# Patient Record
Sex: Female | Born: 1965
Health system: Southern US, Community
[De-identification: ages and names within clinical notes are randomized; demographics above are authoritative.]

## PROBLEM LIST (undated history)

## (undated) ENCOUNTER — Inpatient Hospital Stay (HOSPITAL_COMMUNITY): Payer: 59

## (undated) DIAGNOSIS — F32A Depression, unspecified: Secondary | ICD-10-CM

## (undated) DIAGNOSIS — T4145XA Adverse effect of unspecified anesthetic, initial encounter: Secondary | ICD-10-CM

## (undated) DIAGNOSIS — C801 Malignant (primary) neoplasm, unspecified: Secondary | ICD-10-CM

## (undated) DIAGNOSIS — F329 Major depressive disorder, single episode, unspecified: Secondary | ICD-10-CM

## (undated) DIAGNOSIS — F419 Anxiety disorder, unspecified: Secondary | ICD-10-CM

## (undated) DIAGNOSIS — Z923 Personal history of irradiation: Secondary | ICD-10-CM

## (undated) DIAGNOSIS — M8580 Other specified disorders of bone density and structure, unspecified site: Secondary | ICD-10-CM

## (undated) DIAGNOSIS — R519 Headache, unspecified: Secondary | ICD-10-CM

## (undated) DIAGNOSIS — R51 Headache: Secondary | ICD-10-CM

## (undated) DIAGNOSIS — T8859XA Other complications of anesthesia, initial encounter: Secondary | ICD-10-CM

## (undated) DIAGNOSIS — N6320 Unspecified lump in the left breast, unspecified quadrant: Secondary | ICD-10-CM

## (undated) HISTORY — PX: TONSILLECTOMY: SUR1361

## (undated) HISTORY — DX: Other specified disorders of bone density and structure, unspecified site: M85.80

## (undated) HISTORY — DX: Personal history of irradiation: Z92.3

---

## 1998-03-22 ENCOUNTER — Ambulatory Visit (HOSPITAL_COMMUNITY): Admission: RE | Admit: 1998-03-22 | Discharge: 1998-03-22 | Payer: Self-pay | Admitting: Obstetrics and Gynecology

## 1998-08-03 ENCOUNTER — Inpatient Hospital Stay (HOSPITAL_COMMUNITY): Admission: AD | Admit: 1998-08-03 | Discharge: 1998-08-03 | Payer: Self-pay | Admitting: Obstetrics and Gynecology

## 1998-08-10 ENCOUNTER — Inpatient Hospital Stay (HOSPITAL_COMMUNITY): Admission: AD | Admit: 1998-08-10 | Discharge: 1998-08-12 | Payer: Self-pay | Admitting: Obstetrics and Gynecology

## 1998-09-12 ENCOUNTER — Other Ambulatory Visit: Admission: RE | Admit: 1998-09-12 | Discharge: 1998-09-12 | Payer: Self-pay | Admitting: Obstetrics and Gynecology

## 2000-01-05 ENCOUNTER — Other Ambulatory Visit: Admission: RE | Admit: 2000-01-05 | Discharge: 2000-01-05 | Payer: Self-pay | Admitting: Obstetrics and Gynecology

## 2000-04-15 ENCOUNTER — Emergency Department (HOSPITAL_COMMUNITY): Admission: EM | Admit: 2000-04-15 | Discharge: 2000-04-15 | Payer: Self-pay | Admitting: Emergency Medicine

## 2001-01-10 ENCOUNTER — Encounter: Payer: Self-pay | Admitting: Obstetrics and Gynecology

## 2001-01-10 ENCOUNTER — Encounter: Admission: RE | Admit: 2001-01-10 | Discharge: 2001-01-10 | Payer: Self-pay | Admitting: Obstetrics and Gynecology

## 2002-07-24 ENCOUNTER — Ambulatory Visit (HOSPITAL_COMMUNITY): Admission: RE | Admit: 2002-07-24 | Discharge: 2002-07-24 | Payer: Self-pay | Admitting: Obstetrics & Gynecology

## 2005-01-06 ENCOUNTER — Inpatient Hospital Stay (HOSPITAL_COMMUNITY): Admission: AD | Admit: 2005-01-06 | Discharge: 2005-01-06 | Payer: Self-pay | Admitting: Obstetrics and Gynecology

## 2008-04-27 HISTORY — PX: TRIGGER FINGER RELEASE: SHX641

## 2008-12-21 ENCOUNTER — Ambulatory Visit (HOSPITAL_BASED_OUTPATIENT_CLINIC_OR_DEPARTMENT_OTHER): Admission: RE | Admit: 2008-12-21 | Discharge: 2008-12-21 | Payer: Self-pay | Admitting: Orthopedic Surgery

## 2009-04-30 ENCOUNTER — Ambulatory Visit: Payer: Self-pay | Admitting: Licensed Clinical Social Worker

## 2009-05-21 ENCOUNTER — Ambulatory Visit: Payer: Self-pay | Admitting: Licensed Clinical Social Worker

## 2009-05-28 ENCOUNTER — Ambulatory Visit: Payer: Self-pay | Admitting: Licensed Clinical Social Worker

## 2009-06-04 ENCOUNTER — Ambulatory Visit: Payer: Self-pay | Admitting: Licensed Clinical Social Worker

## 2009-06-11 ENCOUNTER — Ambulatory Visit: Payer: Self-pay | Admitting: Licensed Clinical Social Worker

## 2009-06-18 ENCOUNTER — Ambulatory Visit: Payer: Self-pay | Admitting: Licensed Clinical Social Worker

## 2009-07-23 ENCOUNTER — Ambulatory Visit: Payer: Self-pay | Admitting: Licensed Clinical Social Worker

## 2009-07-30 ENCOUNTER — Ambulatory Visit: Payer: Self-pay | Admitting: Licensed Clinical Social Worker

## 2009-08-20 ENCOUNTER — Ambulatory Visit: Payer: Self-pay | Admitting: Licensed Clinical Social Worker

## 2009-08-27 ENCOUNTER — Ambulatory Visit: Payer: Self-pay | Admitting: Licensed Clinical Social Worker

## 2009-09-09 ENCOUNTER — Emergency Department (HOSPITAL_COMMUNITY): Admission: EM | Admit: 2009-09-09 | Discharge: 2009-09-09 | Payer: Self-pay | Admitting: Emergency Medicine

## 2009-09-10 ENCOUNTER — Ambulatory Visit: Payer: Self-pay | Admitting: Licensed Clinical Social Worker

## 2009-10-01 ENCOUNTER — Ambulatory Visit: Payer: Self-pay | Admitting: Licensed Clinical Social Worker

## 2009-10-08 ENCOUNTER — Ambulatory Visit: Payer: Self-pay | Admitting: Licensed Clinical Social Worker

## 2009-10-17 ENCOUNTER — Ambulatory Visit: Payer: Self-pay | Admitting: Licensed Clinical Social Worker

## 2009-10-29 ENCOUNTER — Ambulatory Visit: Payer: Self-pay | Admitting: Licensed Clinical Social Worker

## 2010-07-14 LAB — URINE CULTURE
Colony Count: NO GROWTH
Culture: NO GROWTH

## 2010-07-14 LAB — POCT URINALYSIS DIP (DEVICE)
Bilirubin Urine: NEGATIVE
Glucose, UA: NEGATIVE mg/dL
Ketones, ur: NEGATIVE mg/dL
Nitrite: NEGATIVE
Protein, ur: NEGATIVE mg/dL
Specific Gravity, Urine: 1.025 (ref 1.005–1.030)
Urobilinogen, UA: 0.2 mg/dL (ref 0.0–1.0)
pH: 6 (ref 5.0–8.0)

## 2010-08-02 LAB — POCT HEMOGLOBIN-HEMACUE: Hemoglobin: 12.8 g/dL (ref 12.0–15.0)

## 2010-08-30 ENCOUNTER — Inpatient Hospital Stay (INDEPENDENT_AMBULATORY_CARE_PROVIDER_SITE_OTHER)
Admission: RE | Admit: 2010-08-30 | Discharge: 2010-08-30 | Disposition: A | Discharge status: Home / Self Care | Payer: 59 | Source: Ambulatory Visit | Attending: Family Medicine | Admitting: Family Medicine

## 2010-08-30 DIAGNOSIS — T148XXA Other injury of unspecified body region, initial encounter: Secondary | ICD-10-CM

## 2010-09-09 NOTE — Op Note (Signed)
NAMEKENNIDY, LAMKE               ACCOUNT NO.:  [id number]   MEDICAL RECORD NO.:  [id number]          PATIENT TYPE:  AMB   LOCATION:  DSC                          FACILITY:  [facility]   PHYSICIAN:  Cindee Salt, M.D.       DATE OF BIRTH:  [birth year]   DATE OF PROCEDURE:  12/21/2008  DATE OF DISCHARGE:                               OPERATIVE REPORT   PREOPERATIVE DIAGNOSIS:  Stenosing tenosynovitis, right thumb.   POSTOPERATIVE DIAGNOSIS:  Stenosing tenosynovitis, right thumb.   OPERATION:  Release of A1 pulley, right thumb.   SURGEON:  Cindee Salt, MD   ASSISTANT:  Carolyne Fiscal, RN   ANESTHESIA:  Forearm-based IV regional with local infiltration.   ANESTHESIOLOGIST:  Zenon Mayo, MD   HISTORY:  The patient is a 45 year old female with a history of  triggering of her right thumb.  This has not responded to conservative  treatment.  She has elected to undergo surgical decompression.  She is  aware of risks and complications including infection; recurrence; injury  to arteries, nerves, tendons; incomplete relief of symptoms; and  dystrophy.  In the preoperative area, the patient is seen, the extremity  marked by both the patient and surgeon, antibiotic given.   PROCEDURE:  The patient was brought to the operating room where a  forearm-based IV regional anesthetic was carried out without difficulty  under the direction of Dr. Sampson Goon.  She was prepped using ChloraPrep  in supine position with right arm free.  A 3-minute dry time was  allowed.  Time-out taken confirming the patient and procedure.  She was  then draped.  A transverse incision was made over the A1 pulley after of  local infiltration with 0.25% Marcaine without epinephrine, 3 mL was  used.  The wound was deepened with blunt dissection.  The neurovascular  bundles identified.  The A1 pulley was identified.  To the radial side  of the A1 pulley, an incision was made releasing the A1 pulley.  The  oblique pulley was  left intact.  The thumb placed through a full range  motion.  No further triggering was noted.  The wound was copiously  irrigated with saline.  The skin was then closed with interrupted 4-0  Vicryl Rapide sutures.  A sterile compressive dressing was applied.  At  deflation of the tourniquet, all fingers immediately pinked.  She was  taken to the recovery room for observation in satisfactory condition.  She will be discharged to home, to return to the [facility] in 1 week, on Vicodin.           ______________________________  Cindee Salt, M.D.     GK/MEDQ  D:  12/21/2008  T:  12/22/2008  Job:  [id number]

## 2011-02-05 DIAGNOSIS — M778 Other enthesopathies, not elsewhere classified: Secondary | ICD-10-CM | POA: Insufficient documentation

## 2011-07-14 ENCOUNTER — Encounter (HOSPITAL_COMMUNITY): Payer: Self-pay

## 2011-09-14 ENCOUNTER — Other Ambulatory Visit: Payer: Self-pay | Admitting: Obstetrics and Gynecology

## 2011-09-14 DIAGNOSIS — R928 Other abnormal and inconclusive findings on diagnostic imaging of breast: Secondary | ICD-10-CM

## 2011-09-17 ENCOUNTER — Ambulatory Visit
Admission: RE | Admit: 2011-09-17 | Discharge: 2011-09-17 | Disposition: A | Discharge status: Home / Self Care | Payer: 59 | Source: Ambulatory Visit | Attending: Obstetrics and Gynecology | Admitting: Obstetrics and Gynecology

## 2011-09-17 DIAGNOSIS — R928 Other abnormal and inconclusive findings on diagnostic imaging of breast: Secondary | ICD-10-CM

## 2012-02-24 ENCOUNTER — Other Ambulatory Visit: Payer: Self-pay | Admitting: Obstetrics and Gynecology

## 2012-02-24 DIAGNOSIS — R921 Mammographic calcification found on diagnostic imaging of breast: Secondary | ICD-10-CM

## 2012-03-21 ENCOUNTER — Ambulatory Visit
Admission: RE | Admit: 2012-03-21 | Discharge: 2012-03-21 | Disposition: A | Discharge status: Home / Self Care | Payer: 59 | Source: Ambulatory Visit | Attending: Obstetrics and Gynecology | Admitting: Obstetrics and Gynecology

## 2012-03-21 DIAGNOSIS — R921 Mammographic calcification found on diagnostic imaging of breast: Secondary | ICD-10-CM

## 2012-04-05 ENCOUNTER — Ambulatory Visit (INDEPENDENT_AMBULATORY_CARE_PROVIDER_SITE_OTHER): Payer: 59 | Admitting: Physician Assistant

## 2012-04-05 ENCOUNTER — Encounter (HOSPITAL_COMMUNITY): Payer: Self-pay | Admitting: Physician Assistant

## 2012-04-05 DIAGNOSIS — F988 Other specified behavioral and emotional disorders with onset usually occurring in childhood and adolescence: Secondary | ICD-10-CM | POA: Insufficient documentation

## 2012-04-05 DIAGNOSIS — M810 Age-related osteoporosis without current pathological fracture: Secondary | ICD-10-CM | POA: Insufficient documentation

## 2012-04-05 MED ORDER — LISDEXAMFETAMINE DIMESYLATE 20 MG PO CAPS: 20.0000 mg | ORAL_CAPSULE | ORAL | Status: DC

## 2012-04-05 NOTE — Progress Notes (Deleted)
Psychiatric Assessment Adult  Patient Identification:  Stephanie [name] Date of Evaluation:  04/05/2012 Chief Complaint: *** History of Chief Complaint:  No chief complaint on file.   HPI Review of Systems Physical Exam  Depressive Symptoms: {DEPRESSION SYMPTOMS:20000}  (Hypo) Manic Symptoms:   Elevated Mood:  {BHH YES OR NO:22294} Irritable Mood:  {BHH YES OR NO:22294} Grandiosity:  {BHH YES OR NO:22294} Distractibility:  {BHH YES OR NO:22294} Labiality of Mood:  {BHH YES OR NO:22294} Delusions:  {BHH YES OR NO:22294} Hallucinations:  {BHH YES OR NO:22294} Impulsivity:  {BHH YES OR NO:22294} Sexually Inappropriate Behavior:  {BHH YES OR NO:22294} Financial Extravagance:  {BHH YES OR NO:22294} Flight of Ideas:  {BHH YES OR NO:22294}  Anxiety Symptoms: Excessive Worry:  {BHH YES OR NO:22294} Panic Symptoms:  {BHH YES OR NO:22294} Agoraphobia:  {BHH YES OR NO:22294} Obsessive Compulsive: {BHH YES OR NO:22294}  Symptoms: {Obsessive Compulsive Symptoms:22671} Specific Phobias:  {BHH YES OR NO:22294} Social Anxiety:  {BHH YES OR NO:22294}  Psychotic Symptoms:  Hallucinations: {BHH YES OR NO:22294} {Hallucinations:22672} Delusions:  {BHH YES OR NO:22294} Paranoia:  {BHH YES OR NO:22294}   Ideas of Reference:  {BHH YES OR NO:22294}  PTSD Symptoms: Ever had a traumatic exposure:  {BHH YES OR NO:22294} Had a traumatic exposure in the last month:  {BHH YES OR NO:22294} Re-experiencing: {BHH YES OR NO:22294} {Re-experiencing:22673} Hypervigilance:  {BHH YES OR NO:22294} Hyperarousal: {BHH YES OR NO:22294} {Hyperarousal:22674} Avoidance: {BHH YES OR NO:22294} {Avoidance:22675}  Traumatic Brain Injury: {BHH YES OR NO:22294} {Traumatic Brain Injury:22676}  Past Psychiatric History: Diagnosis: ***  Hospitalizations: ***  Outpatient Care: ***  Substance Abuse Care: ***  Self-Mutilation: ***  Suicidal Attempts: ***  Violent Behaviors: ***   Past Medical History:   Past  Medical History  Diagnosis Date  . Osteopenia    History of Loss of Consciousness:  {BHH YES OR NO:22294} Seizure History:  {BHH YES OR NO:22294} Cardiac History:  {BHH YES OR NO:22294} Allergies:  No Known Allergies Current Medications:  Current Outpatient Prescriptions  Medication Sig Dispense Refill  . citalopram (CELEXA) 20 MG tablet Take 20 mg by mouth daily.      Marland Kitchen lisdexamfetamine (VYVANSE) 20 MG capsule Take 1 capsule (20 mg total) by mouth every morning.  30 capsule  0  . lisdexamfetamine (VYVANSE) 20 MG capsule Take 1 capsule (20 mg total) by mouth every morning.  30 capsule  0    Previous Psychotropic Medications:  Medication Dose   ***  ***                     Substance Abuse History in the last 12 months: Substance Age of 1st Use Last Use Amount Specific Type  Nicotine  ***  ***  ***  ***  Alcohol  ***  ***  ***  ***  Cannabis  ***  ***  ***  ***  Opiates  ***  ***  ***  ***  Cocaine  ***  ***  ***  ***  Methamphetamines  ***  ***  ***  ***  LSD  ***  ***  ***  ***  Ecstasy  ***   ***  ***  ***  Benzodiazepines  ***  ***  ***  ***  Caffeine  ***  ***  ***  ***  Inhalants  ***  ***  ***  ***  Others:  Medical Consequences of Substance Abuse: ***  Legal Consequences of Substance Abuse: ***  Family Consequences of Substance Abuse: ***  Blackouts:  {BHH YES OR NO:22294} DT's:  {BHH YES OR NO:22294} Withdrawal Symptoms:  {BHH YES OR NO:22294} {Withdrawal Symptoms:22677}  Social History: Current Place of Residence: *** Place of Birth: *** Family Members: *** Marital Status:  {Marital Status:22678} Children: ***  Sons: ***  Daughters: *** Relationships: *** Education:  {Education:22679} Educational Problems/Performance: *** Religious Beliefs/Practices: *** History of Abuse: {Desc; abuse:16542} Occupational Experiences; Military History:  {Military History:22680} Legal History: *** Hobbies/Interests: ***  Family  History:   Family History  Problem Relation Age of Onset  . ADD / ADHD Son   . ADD / ADHD Daughter   . Depression Brother     Mental Status Examination/Evaluation: Objective:  Appearance: {Appearance:22683}  Eye Contact::  {BHH EYE CONTACT:22684}  Speech:  {Speech:22685}  Volume:  {Volume (PAA):22686}  Mood:  ***  Affect:  {Affect (PAA):22687}  Thought Process:  {Thought Process (PAA):22688}  Orientation:  {BHH ORIENTATION (PAA):22689}  Thought Content:  {Thought Content:22690}  Suicidal Thoughts:  {ST/HT (PAA):22692}  Homicidal Thoughts:  {ST/HT (PAA):22692}  Judgement:  {Judgement (PAA):22694}  Insight:  {Insight (PAA):22695}  Psychomotor Activity:  {Psychomotor (PAA):22696}  Akathisia:  {BHH YES OR NO:22294}  Handed:  {Handed:22697}  AIMS (if indicated):  ***  Assets:  {Assets (PAA):22698}    Laboratory/X-Ray Psychological Evaluation(s)   ***  ***   Assessment:  {axis diagnosis:3049000}  AXIS I {psych axis 1:31909}  AXIS II {psych axis 2:31910}  AXIS III Past Medical History  Diagnosis Date  . Osteopenia      AXIS IV {psych axis iv:31915}  AXIS V {psych axis v score:31919}   Treatment Plan/Recommendations:  Plan of Care: ***  Laboratory:  {Laboratory:22682}  Psychotherapy: ***  Medications: ***  Routine PRN Medications:  {BHH YES OR NO:22294}  Consultations: ***  Safety Concerns:  ***  Other:      Lulu Hirschmann, PA-C 12/10/20134:53 PM

## 2012-04-05 NOTE — Progress Notes (Signed)
Psychiatric Assessment Adult  Patient Identification:  Stephanie [name] Date of Evaluation:  04/05/2012 Chief Complaint: ADHD History of Chief Complaint:   Chief Complaint  Patient presents with  . ADHD  . Establish Care    HPI Stephanie (Stephanie [name]) was referred by a coworker who is also a patient of this provider. She reports that she has been diagnosed ADHD in the past, but she has been able to manage it until recently when she is experiencing perimenopausal and having to take hormone replacement supplements. She complains of inattentiveness, easily distracted, forgetfulness, losing items, trouble finishing projects, procrastination, and disorganization. She reports that she is fidgety, but denies impulsivity.  Her gynecologist started her on Celexa and Adderall about 6 weeks ago. She reports that both have been helpful, but the Adderall seems to have plateaued. Review of Systems Physical Exam  Depressive Symptoms: difficulty concentrating, decreased appetite,  (Hypo) Manic Symptoms:   Elevated Mood:  No Irritable Mood:  Yes Grandiosity:  No Distractibility:  Yes Labiality of Mood:  Yes Delusions:  No Hallucinations:  No Impulsivity:  No Sexually Inappropriate Behavior:  No Financial Extravagance:  No Flight of Ideas:  No  Anxiety Symptoms: Excessive Worry:  No Panic Symptoms:  No Agoraphobia:  No Obsessive Compulsive: No  Symptoms: None, Specific Phobias:  No Social Anxiety:  No  Psychotic Symptoms:  Hallucinations: No None Delusions:  No Paranoia:  No   Ideas of Reference:  No  PTSD Symptoms: Ever had a traumatic exposure:  No Had a traumatic exposure in the last month:  No Re-experiencing: NA None Hypervigilance:  NA Hyperarousal: NA None Avoidance: NA None  Traumatic Brain Injury: No   Past Psychiatric History: Diagnosis: ADHD   Hospitalizations: None   Outpatient Care: Gynecologist only   Substance Abuse Care: None   Self-Mutilation: None   Suicidal  Attempts: None   Violent Behaviors: None    Past Medical History:   Past Medical History  Diagnosis Date  . Osteopenia    History of Loss of Consciousness:  No Seizure History:  No Cardiac History:  No Allergies:  No Known Allergies Current Medications:  Current Outpatient Prescriptions  Medication Sig Dispense Refill  . citalopram (CELEXA) 20 MG tablet Take 20 mg by mouth daily.      Marland Kitchen lisdexamfetamine (VYVANSE) 20 MG capsule Take 1 capsule (20 mg total) by mouth every morning.  30 capsule  0  . lisdexamfetamine (VYVANSE) 20 MG capsule Take 1 capsule (20 mg total) by mouth every morning.  30 capsule  0    Previous Psychotropic Medications:  Medication Dose   Celexa   20 mg daily   Adderall   10 mg daily                  Substance Abuse History in the last 12 months: Patient denies any history of substance abuse  Social History: Stephanie [name] was born and grew up in [place] [facility] [place]. She has 3 older brothers. She never suffered any abuse. Her father died when she was 18 years of age. She completed her bachelor of science in nursing in 1989, and has been working as an Charity fundraiser . She has been married for 21 years, and she and her husband have a 11 year old son and a 62 year old daughter. She affiliates as Control and instrumentation engineer. She denies any legal involvement. She enjoys hiking and reading. She reports her social support consists of friends from college, and a friend from church.   Family History:   Family History  Problem Relation Age of Onset  . ADD / ADHD Son   . ADD / ADHD Daughter   . Depression Brother     Mental Status Examination/Evaluation: Objective:  Appearance: Casual and Well Groomed  Eye Contact::  Good  Speech:  Clear and Coherent  Volume:  Normal  Mood:  Euthymic   Affect:  Appropriate  Thought Process:  Linear  Orientation:  Full (Time, Place, and Person)  Thought Content:  WDL  Suicidal Thoughts:  No  Homicidal Thoughts:  No  Judgement:  Good  Insight:  Good   Psychomotor Activity:  Normal  Akathisia:  No  Handed:  Right  AIMS (if indicated):    Assets:  Communication Skills Desire for Improvement Financial Resources/Insurance Housing Intimacy Leisure Time Physical Health Social Support Transportation Vocational/Educational    Laboratory/X-Ray Psychological Evaluation(s)        Assessment:    AXIS I ADHD, inattentive type  AXIS II Deferred  AXIS III Past Medical History  Diagnosis Date  . Osteopenia      AXIS IV Mild  AXIS V 51-60 moderate symptoms   Treatment Plan/Recommendations:  Plan of Care: We will try her on Vyvanse 20 mg daily, she had permission to double that dose if significant improvement is not seen within 1 week.   Laboratory:    Psychotherapy: None at this time   Medications: Vyvanse 20 mg daily   Routine PRN Medications:  No  Consultations: None   Safety Concerns:  None   Other:      Nadav Swindell, PA-C 12/10/20134:57 PM

## 2012-04-29 ENCOUNTER — Telehealth (HOSPITAL_COMMUNITY): Payer: Self-pay

## 2012-05-02 ENCOUNTER — Other Ambulatory Visit (HOSPITAL_COMMUNITY): Payer: Self-pay | Admitting: Physician Assistant

## 2012-05-02 ENCOUNTER — Telehealth (HOSPITAL_COMMUNITY): Payer: Self-pay

## 2012-05-02 MED ORDER — AMPHETAMINE-DEXTROAMPHETAMINE 10 MG PO TABS: 10.0000 mg | ORAL_TABLET | Freq: Every day | ORAL | Status: DC

## 2012-05-02 NOTE — Telephone Encounter (Signed)
5:05PM  05/02/12  CALLED AND LEFT MSG THAT RX SCRIPT FOR ADDERALL 10MG  IS READY FOR PICK-UP./SH

## 2012-05-02 NOTE — Telephone Encounter (Signed)
Patient reports "significant rebound" when Vyvanse wears off. Wants to resume Adderall. Prescriptions written and patient told to pick up at her convenience.

## 2012-05-04 ENCOUNTER — Telehealth (HOSPITAL_COMMUNITY): Payer: Self-pay

## 2012-05-04 NOTE — Telephone Encounter (Signed)
1:29PM 05/04/12 PATIENT CAME TO PICK-UP RX SCRIPT./SH

## 2012-06-03 ENCOUNTER — Telehealth (HOSPITAL_COMMUNITY): Payer: Self-pay

## 2012-06-08 ENCOUNTER — Ambulatory Visit (INDEPENDENT_AMBULATORY_CARE_PROVIDER_SITE_OTHER): Payer: 59 | Admitting: Physician Assistant

## 2012-06-08 DIAGNOSIS — F988 Other specified behavioral and emotional disorders with onset usually occurring in childhood and adolescence: Secondary | ICD-10-CM

## 2012-07-25 NOTE — Progress Notes (Signed)
   [facility] Behavioral Health Follow-up Outpatient Visit  Stephanie [name] February 13, 1966  Date: 06/08/2012   Subjective: [name] presents today to followup on her treatment for ADHD. The Vyvanse had been making her irritable so we changed it to Adderall 10 mg daily by telephone. She reports that it lasts from about 7 AM till 11:30 AM. She has not tried taking 2 of them during the day. She denies any side effects to the Adderall.  There were no vitals filed for this visit.  Mental Status Examination  Appearance: Casual Alert: Yes Attention: good  Cooperative: Yes Eye Contact: Good Speech: Clear and coherent Psychomotor Activity: Normal Memory/Concentration: Intact Oriented: person, place, time/date and situation Mood: Euthymic Affect: Appropriate Thought Processes and Associations: Linear Fund of Knowledge: Good Thought Content: Normal Insight: Good Judgement: Good  Diagnosis: ADHD, inattentive type  Treatment Plan: We will increase the Adderall 10 mg so that she can take it twice daily. She will return for followup in 2 months. She is encouraged to call if there are concerns between appointments.  Kerrie Timm, PA-C

## 2012-08-01 ENCOUNTER — Telehealth (HOSPITAL_COMMUNITY): Payer: Self-pay | Admitting: *Deleted

## 2012-08-01 DIAGNOSIS — F988 Other specified behavioral and emotional disorders with onset usually occurring in childhood and adolescence: Secondary | ICD-10-CM

## 2012-08-01 MED ORDER — AMPHETAMINE-DEXTROAMPHETAMINE 10 MG PO TABS: 10.0000 mg | ORAL_TABLET | Freq: Two times a day (BID) | ORAL | Status: DC

## 2012-08-01 NOTE — Telephone Encounter (Signed)
VM Recv'd 4/7 0820:Given Rx for Adderall 10 mg#60-Instructed to take 2 x day in appt.Instructions on RX read 1 x day.Pharmacy will only give #30 for month. Needs new RX and pharmacy called.new RX auth by A.Elmon Kirschner, [place]

## 2012-08-02 ENCOUNTER — Telehealth (HOSPITAL_COMMUNITY): Payer: Self-pay

## 2012-08-02 NOTE — Telephone Encounter (Signed)
08/02/12 2:36pm patient came and pickup rx script./sh

## 2012-09-23 ENCOUNTER — Telehealth (HOSPITAL_COMMUNITY): Payer: Self-pay

## 2012-09-23 ENCOUNTER — Other Ambulatory Visit (HOSPITAL_COMMUNITY): Payer: Self-pay | Admitting: Physician Assistant

## 2012-09-23 DIAGNOSIS — F988 Other specified behavioral and emotional disorders with onset usually occurring in childhood and adolescence: Secondary | ICD-10-CM

## 2012-09-23 MED ORDER — AMPHETAMINE-DEXTROAMPHETAMINE 10 MG PO TABS: 10.0000 mg | ORAL_TABLET | Freq: Two times a day (BID) | ORAL | Status: DC

## 2012-09-23 NOTE — Telephone Encounter (Signed)
4:20PM 09/23/12 PROVIDER HAS COMPLETED THE RX SCRIPT FOR ADDERALL - PT CONTACTED AND ON THE WAY TO PICK-UP RX/SH

## 2012-09-23 NOTE — Telephone Encounter (Signed)
1:27PM 09/23/12  PATIENT CALLED NEED MEDICATION REFILL  GOING OUT OF TOWN CAN'T FIND HER PRIOR RX SCRIPT - ADDERAL 10MG . PLEASE CALL PT  #[phone]/SH

## 2012-10-19 ENCOUNTER — Ambulatory Visit (INDEPENDENT_AMBULATORY_CARE_PROVIDER_SITE_OTHER): Payer: 59 | Admitting: Physician Assistant

## 2012-10-19 VITALS — BP 116/72 | HR 84 | Ht 63.0 in | Wt 130.3 lb

## 2012-10-19 DIAGNOSIS — F988 Other specified behavioral and emotional disorders with onset usually occurring in childhood and adolescence: Secondary | ICD-10-CM

## 2012-10-19 MED ORDER — AMPHETAMINE-DEXTROAMPHETAMINE 10 MG PO TABS: 10.0000 mg | ORAL_TABLET | Freq: Two times a day (BID) | ORAL | Status: DC

## 2012-10-21 ENCOUNTER — Encounter (HOSPITAL_COMMUNITY): Payer: Self-pay | Admitting: Physician Assistant

## 2012-10-21 NOTE — Progress Notes (Signed)
   [facility] Behavioral Health Follow-up Outpatient Visit  Stephanie [name] November 14, 1965  Date: 10/19/2012   Subjective: Stephanie [name] presents today to followup on her treatment for ADHD. She has been taking her Adderall twice daily on days that she works, and once daily on other days.  She endorses increased irritablity in the evenings of days that she only takes one. She also reports that the Adderall takes away her thirst, and she has to remind herself to drink to stay hydrated.  She takes the the first dose at 7:30 am and it wears off around noon.  She takes the second dose around 1:00 pm.  She denies sleep or appetite problems.  Filed Vitals:   10/19/12 1530  BP: 116/72  Pulse: 84    Mental Status Examination  Appearance: Casual  Alert: Yes Attention: good  Cooperative: Yes Eye Contact: Good Speech: Clear and coherent Psychomotor Activity: Normal Memory/Concentration: Intact Oriented: person, place, time/date and situation Mood: Euthymic Affect: Appropriate Thought Processes and Associations: Linear Fund of Knowledge: Good Thought Content: Normal Insight: Good Judgement: Good  Diagnosis: ADHD, inattentive type  Treatment Plan: We will continue her Adderall 10 mg twice daily, and she will followup in 3 months.  Cristal Howatt, PA-C

## 2012-10-24 ENCOUNTER — Telehealth (HOSPITAL_COMMUNITY): Payer: Self-pay | Admitting: *Deleted

## 2012-10-24 NOTE — Telephone Encounter (Signed)
VM: Patient states having some tremors, especially in hands. Always thought it was Adderall. REalized she is having tremors 10 hours after taking short acting Adderall, so does not think that is what it is anymore. Wondering if Side effect from Celexa. Discussed changing Celexa to Wellblutrin with provider. Would like to try this and see if tremors go away. Wants to know how she would go about changing from one mediciane to another. Requests call from provider.

## 2012-10-25 ENCOUNTER — Telehealth (HOSPITAL_COMMUNITY): Payer: Self-pay | Admitting: Physician Assistant

## 2012-10-25 NOTE — Telephone Encounter (Signed)
Returned call to patient to address her concerns of tremor. Patient reports that tremor continues even after Adderall is out of her system. Reports she has been on Celexa for 8 or 9 months now, and wonders if that might be the cause. Told patient to take half dose of Celexa for 4 days then discontinue, and wait another several days to see if tremor resides. Patient understands and will call with update.

## 2012-11-08 ENCOUNTER — Ambulatory Visit (HOSPITAL_COMMUNITY): Payer: Self-pay | Admitting: Physician Assistant

## 2012-11-08 ENCOUNTER — Telehealth (HOSPITAL_COMMUNITY): Payer: Self-pay | Admitting: Physician Assistant

## 2012-11-08 NOTE — Telephone Encounter (Signed)
Patient called to report that discontinuing Celexa was very difficult. She continues to have a significant amount of anxiety, and is having a significant amount of difficulty in her relationship with her son. She is interested in medication for anxiety, and states that she has Prozac left from an old prescription. We will resume Prozac 20 mg daily. She has a followup appointment on September 17.

## 2012-11-17 ENCOUNTER — Other Ambulatory Visit: Payer: Self-pay | Admitting: Obstetrics and Gynecology

## 2012-11-17 DIAGNOSIS — R921 Mammographic calcification found on diagnostic imaging of breast: Secondary | ICD-10-CM

## 2012-12-01 ENCOUNTER — Ambulatory Visit
Admission: RE | Admit: 2012-12-01 | Discharge: 2012-12-01 | Disposition: A | Discharge status: Home / Self Care | Payer: 59 | Source: Ambulatory Visit | Attending: Obstetrics and Gynecology | Admitting: Obstetrics and Gynecology

## 2012-12-01 DIAGNOSIS — R921 Mammographic calcification found on diagnostic imaging of breast: Secondary | ICD-10-CM

## 2013-01-10 ENCOUNTER — Ambulatory Visit (INDEPENDENT_AMBULATORY_CARE_PROVIDER_SITE_OTHER): Payer: 59 | Admitting: Physician Assistant

## 2013-01-10 ENCOUNTER — Encounter (HOSPITAL_COMMUNITY): Payer: Self-pay | Admitting: Physician Assistant

## 2013-01-10 VITALS — BP 111/67 | HR 75 | Ht 63.0 in | Wt 132.4 lb

## 2013-01-10 DIAGNOSIS — F411 Generalized anxiety disorder: Secondary | ICD-10-CM

## 2013-01-10 DIAGNOSIS — F988 Other specified behavioral and emotional disorders with onset usually occurring in childhood and adolescence: Secondary | ICD-10-CM

## 2013-01-10 DIAGNOSIS — F3289 Other specified depressive episodes: Secondary | ICD-10-CM

## 2013-01-10 DIAGNOSIS — F329 Major depressive disorder, single episode, unspecified: Secondary | ICD-10-CM | POA: Insufficient documentation

## 2013-01-10 MED ORDER — BUPROPION HCL ER (XL) 150 MG PO TB24: 150.0000 mg | ORAL_TABLET | Freq: Every day | ORAL | Status: DC

## 2013-01-10 MED ORDER — FLUOXETINE HCL 20 MG PO TABS: 20.0000 mg | ORAL_TABLET | Freq: Every day | ORAL | Status: DC

## 2013-01-10 NOTE — Progress Notes (Signed)
[facility] Behavioral Health [address] Progress Note  Stephanie [name] [id number] 47 y.o.  01/10/2013 3:44 PM  Chief Complaint: Increased depression  History of Present Illness: Stephanie [name] presents today to followup on her treatment for anxiety and ADHD. She reports that her anxiety is better since we switched Celexa to Prozac, but her depression has increased. She stopped taking Adderall about 6 weeks ago because she didn't want to depend on a "crutch." She reports that she is really tired and wants to sleep a lot. Her appetite is good but she is not eating a healthy diet. She is experiencing a depressed mood with decreased interest in participation and poor motivation. She denies any suicidal or homicidal ideation. She also endorses increased symptoms of ADHD including forgetfulness and losing items.  Suicidal Ideation: No Plan Formed: No Patient has means to carry out plan: No  Homicidal Ideation: No Plan Formed: No Patient has means to carry out plan: No  Review of Systems: Psychiatric: Agitation: No Hallucination: No Depressed Mood: Yes Insomnia: No Hypersomnia: Yes Altered Concentration: Yes Feels Worthless: No Grandiose Ideas: No Belief In Special Powers: No New/Increased Substance Abuse: No Compulsions: No  Neurologic: Headache: No Seizure: No Paresthesias: No  Past Medical History:  Past Medical History   Diagnosis  Date   .  Osteopenia     Social History:  Stephanie [name] was born and grew up in [facility] [place] [facility]. She has 3 older brothers. She never suffered any abuse. Her father died when she was 35 years of age. She completed her bachelor of science in nursing in 1989, and has been working as an Charity fundraiser . She has been married for 21 years, and she and her husband have a 39 year old son and a 40 year old daughter. She affiliates as Control and instrumentation engineer. She denies any legal involvement. She enjoys hiking and reading. She reports her social support consists of friends from college, and a friend from  church.   Family History:  Family History   Problem  Relation  Age of Onset   .  ADD / ADHD  Son    .  ADD / ADHD  Daughter    .  Depression  Brother     Outpatient Encounter Prescriptions as of 01/10/2013  Medication Sig Dispense Refill  . amphetamine-dextroamphetamine (ADDERALL) 10 MG tablet Take 1 tablet (10 mg total) by mouth 2 (two) times daily.  60 tablet  0  . amphetamine-dextroamphetamine (ADDERALL) 10 MG tablet Take 1 tablet (10 mg total) by mouth 2 (two) times daily.  60 tablet  0  . amphetamine-dextroamphetamine (ADDERALL) 10 MG tablet Take 1 tablet (10 mg total) by mouth 2 (two) times daily.  60 tablet  0  . buPROPion (WELLBUTRIN XL) 150 MG 24 hr tablet Take 1 tablet (150 mg total) by mouth daily.  90 tablet  0  . FLUoxetine (PROZAC) 20 MG tablet Take 1 tablet (20 mg total) by mouth daily.  90 tablet  0  . [DISCONTINUED] citalopram (CELEXA) 20 MG tablet Take 20 mg by mouth daily.       No facility-administered encounter medications on file as of 01/10/2013.    Past Psychiatric History/Hospitalization(s): Anxiety: Yes Bipolar Disorder: No Depression: Yes Mania: No Psychosis: No Schizophrenia: No Personality Disorder: No Hospitalization for psychiatric illness: No History of Electroconvulsive Shock Therapy: No Prior Suicide Attempts: No  Physical Exam: Constitutional:  BP 111/67  Pulse 75  Ht 5\' 3"  (1.6 m)  Wt 132 lb 6.4 oz (60.056 kg)  BMI 23.46 kg/m2  General  Appearance: alert, oriented, no acute distress, well nourished and well groomed and casually dressed  Musculoskeletal: Strength & Muscle Tone: within normal limits Gait & Station: normal Patient leans: N/A  Psychiatric: Speech (describe rate, volume, coherence, spontaneity, and abnormalities if any): Clear and coherent at a regular rate and rhythm and normal volume  Thought Process (describe rate, content, abstract reasoning, and computation): Within normal limits  Associations: Intact  Thoughts:  normal  Mental Status: Orientation: oriented to person, place, time/date and situation Mood & Affect: Dysphoric Attention Span & Concentration: Intact  Medical Decision Making (Choose Three): Review of Psycho-Social Stressors (1), Established Problem, Worsening (2), Review of Medication Regimen & Side Effects (2) and Review of New Medication or Change in Dosage (2)  Assessment: Axis I: ADHD, inattentive type; generalized anxiety disorder; depressive disorder NOS  Axis II: Deferred  Axis III: Osteopenia  Axis IV: Mild  Axis V: 60   Plan: We'll continue her Prozac at 20 mg daily and initiate Wellbutrin XL 150 mg daily to target depression and ADHD. She will return for followup in approximately 4 weeks.  Vernia Teem, PA-C 01/10/2013

## 2013-01-30 ENCOUNTER — Telehealth (HOSPITAL_COMMUNITY): Payer: Self-pay | Admitting: *Deleted

## 2013-01-30 NOTE — Telephone Encounter (Signed)
[id number] started last appt. Now has started having a lot of itching, not hives, but itches a lot. Could it be Wellbutrin, and if it is, what should she do?  Called pt - itching was worse last night. Takes medication in am and itching has steadily increased. Working well, except for itching. Really does not want to change medicine.

## 2013-01-31 ENCOUNTER — Other Ambulatory Visit (HOSPITAL_COMMUNITY): Payer: Self-pay | Admitting: Psychiatry

## 2013-01-31 NOTE — Telephone Encounter (Signed)
Contacted pt per Dr.Kumar: Stop Wellbutrin immediately.See Jorje Guild, PA as soon as possible, within next 10 days. By then medication will be out of her system and new medicine can be started.

## 2013-01-31 NOTE — Telephone Encounter (Signed)
Discontinue Wellbutrin XL as patient has hives on it

## 2013-01-31 NOTE — Telephone Encounter (Signed)
Pt had contacted office this morning to report hives.  Contacted pt to establish timeline. Taking Wellbutrin 2-3 weks.Feeling better. Started itching all over,especially head on Sunday 10/5.Called office 10/6-Only itching. Called office 10/7-Hives developed during night,around ribs. Did not take Wellbutrin 10/7

## 2013-02-09 ENCOUNTER — Encounter (HOSPITAL_COMMUNITY): Payer: Self-pay | Admitting: Physician Assistant

## 2013-02-09 ENCOUNTER — Ambulatory Visit (INDEPENDENT_AMBULATORY_CARE_PROVIDER_SITE_OTHER): Payer: 59 | Admitting: Physician Assistant

## 2013-02-09 ENCOUNTER — Encounter (INDEPENDENT_AMBULATORY_CARE_PROVIDER_SITE_OTHER): Payer: Self-pay

## 2013-02-09 VITALS — BP 105/64 | HR 81 | Ht 63.0 in | Wt 131.0 lb

## 2013-02-09 DIAGNOSIS — F411 Generalized anxiety disorder: Secondary | ICD-10-CM

## 2013-02-09 DIAGNOSIS — F988 Other specified behavioral and emotional disorders with onset usually occurring in childhood and adolescence: Secondary | ICD-10-CM

## 2013-02-09 DIAGNOSIS — F329 Major depressive disorder, single episode, unspecified: Secondary | ICD-10-CM

## 2013-02-09 DIAGNOSIS — F3289 Other specified depressive episodes: Secondary | ICD-10-CM

## 2013-02-09 MED ORDER — ATOMOXETINE HCL 25 MG PO CAPS: 25.0000 mg | ORAL_CAPSULE | Freq: Two times a day (BID) | ORAL | Status: DC

## 2013-02-09 NOTE — Progress Notes (Signed)
[facility] [address] Progress Note  Stephanie [name] [id number] 47 y.o.  02/09/2013 3:30 PM  Chief Complaint: depression and inability to focus  History of Present Illness: Stephanie [name] presents today to followup on her treatment for depression and ADHD. At her last visit we started her on Wellbutrin, but she developed hives, and discontinued the medication. She reports that it seemed to be working well for both her depression and ADHD. She reports that when she stopped the Wellbutrin she had some rebound depression and felt extremely unmotivated. She feels that she still needs something for both her inattentiveness and depression.  Suicidal Ideation: No Plan Formed: No Patient has means to carry out plan: No  Homicidal Ideation: No Plan Formed: No Patient has means to carry out plan: No  Review of Systems: Psychiatric: Agitation: No Hallucination: No Depressed Mood: Yes Insomnia: No Hypersomnia: No Altered Concentration: Yes Feels Worthless: No Grandiose Ideas: No Belief In Special Powers: No New/Increased Substance Abuse: No Compulsions: No  Neurologic: Headache: No Seizure: No Paresthesias: No  Past Medical History:  Past Medical History   Diagnosis  Date   .  Osteopenia     Social History:  Stephanie [name] was born and grew up in [facility]. She has 3 older brothers. She never suffered any abuse. Her father died when she was 58 years of age. She completed her bachelor of science in nursing in 1989, and has been working as an Charity fundraiser . She has been married for 21 years, and she and her husband have a 60 year old son and a 25 year old daughter. She affiliates as Control and instrumentation engineer. She denies any legal involvement. She enjoys hiking and reading. She reports her social support consists of friends from college, and a friend from church.   Family History:  Family History   Problem  Relation  Age of Onset   .  ADD / ADHD  Son    .  ADD / ADHD  Daughter    .  Depression  Brother        Outpatient Encounter Prescriptions as of 02/09/2013  Medication Sig Dispense Refill  . amphetamine-dextroamphetamine (ADDERALL) 10 MG tablet Take 1 tablet (10 mg total) by mouth 2 (two) times daily.  60 tablet  0  . amphetamine-dextroamphetamine (ADDERALL) 10 MG tablet Take 1 tablet (10 mg total) by mouth 2 (two) times daily.  60 tablet  0  . amphetamine-dextroamphetamine (ADDERALL) 10 MG tablet Take 1 tablet (10 mg total) by mouth 2 (two) times daily.  60 tablet  0  . atomoxetine (STRATTERA) 25 MG capsule Take 1 capsule (25 mg total) by mouth 2 (two) times daily.  60 capsule  0  . FLUoxetine (PROZAC) 20 MG tablet Take 1 tablet (20 mg total) by mouth daily.  90 tablet  0   No facility-administered encounter medications on file as of 02/09/2013.   Past Psychiatric History/Hospitalization(s):  Anxiety: Yes  Bipolar Disorder: No  Depression: Yes  Mania: No  Psychosis: No  Schizophrenia: No  Personality Disorder: No  Hospitalization for psychiatric illness: No  History of Electroconvulsive Shock Therapy: No  Prior Suicide Attempts: No  Physical Exam: Constitutional:  BP 105/64  Pulse 81  Ht 5\' 3"  (1.6 m)  Wt 131 lb (59.421 kg)  BMI 23.21 kg/m2  General Appearance: alert, oriented, no acute distress and well nourished  Musculoskeletal: Strength & Muscle Tone: within normal limits Gait & Station: normal Patient leans: N/A  Psychiatric: Speech (describe rate, volume, coherence, spontaneity, and abnormalities if any):  clear and coherent at a regular rate and rhythm and normal volume  Thought Process (describe rate, content, abstract reasoning, and computation): within normal limits  Associations: Intact  Thoughts: normal  Mental Status: Orientation: oriented to person, place, time/date and situation Mood & Affect: normal affect Attention Span & Concentration: intact  Medical Decision Making (Choose Three): Established Problem, Stable/Improving (1), Review of  Psycho-Social Stressors (1) and Review of New Medication or Change in Dosage (2)  Assessment: Axis I: ADHD, inattentive type; generalized anxiety disorder; depressive disorder NOS  Axis II: deferred  Axis III: osteopenia  Axis IV: mild  Axis V: 60   Plan: we will discontinue the Wellbutrin as she developed an allergic reaction. We will initiate Strattera 25 mg daily for one week, then increase to 50 mg daily, with a plan to increase to 80 mg daily. We will continue her Prozac at 20 mg daily, but if the Strattera does not relieve her depressive symptoms, we will increase it. She will followup in 4 weeks.  Keyshawn Hellwig, PA-C 02/09/2013

## 2013-02-21 ENCOUNTER — Ambulatory Visit (HOSPITAL_COMMUNITY): Payer: Self-pay | Admitting: Physician Assistant

## 2013-03-13 ENCOUNTER — Encounter (HOSPITAL_COMMUNITY): Payer: Self-pay | Admitting: Psychiatry

## 2013-03-13 ENCOUNTER — Ambulatory Visit (INDEPENDENT_AMBULATORY_CARE_PROVIDER_SITE_OTHER): Payer: 59 | Admitting: Psychiatry

## 2013-03-13 VITALS — BP 118/81 | HR 111 | Ht 63.0 in | Wt 131.8 lb

## 2013-03-13 DIAGNOSIS — F329 Major depressive disorder, single episode, unspecified: Secondary | ICD-10-CM

## 2013-03-13 DIAGNOSIS — F3289 Other specified depressive episodes: Secondary | ICD-10-CM

## 2013-03-13 DIAGNOSIS — F988 Other specified behavioral and emotional disorders with onset usually occurring in childhood and adolescence: Secondary | ICD-10-CM

## 2013-03-13 DIAGNOSIS — F411 Generalized anxiety disorder: Secondary | ICD-10-CM

## 2013-03-13 MED ORDER — ATOMOXETINE HCL 40 MG PO CAPS: 40.0000 mg | ORAL_CAPSULE | Freq: Every day | ORAL | Status: DC

## 2013-03-13 NOTE — Progress Notes (Signed)
[facility] [address] Progress Note  Stephanie [name] [id number] 47 y.o.  03/13/2013 3:30 PM  Chief Complaint:  I forgot to increase Strattera twice a day.    History of Present Illness: Stephanie [name] is 12 year old Caucasian married employed female who came for her appointment.  This is the first time I seen this patient.  She has been seen in this office by Jorje Guild our physician assistant who left the practice.  Patient was diagnosed by ADD and depression.  She has tried multiple medication in the past including Adderall, Celexa , Wellbutrin and vyvance.  She was taught at the [facility] on her last visit and recommended to take 25 mg twice a day after one week however patient did not remember increasing that dose.  She continues to take 25 mg.  She admitted recently feeling the [place].  She does not see much improvement in her attention and focus.  She denies any side effects of medication.  She likes the Prozac 20 mg which he is taking to help her depression.  She denies any crying spells or any irritability.  She is sleeping okay.  She has 70 year old son and 28 year old daughter.  She denies any tremors or shakes.  She denies any suicidal thoughts or homicidal thoughts.  She believes her ADHD symptoms are better with Strattera however does need to be increased.  She is not drinking or using any illegal substance.  She recently has annual physical checkup and blood work but she do not know the results.  Suicidal Ideation: No Plan Formed: No Patient has means to carry out plan: No  Homicidal Ideation: No Plan Formed: No Patient has means to carry out plan: No  Review of Systems: Psychiatric: Agitation: No Hallucination: No Depressed Mood: Yes Insomnia: No Hypersomnia: No Altered Concentration: Yes Feels Worthless: No Grandiose Ideas: No Belief In Special Powers: No New/Increased Substance Abuse: No Compulsions: No  Neurologic: Headache: No Seizure: No Paresthesias: No  Past  Medical History:  Past Medical History   Diagnosis  Date   .  Osteopenia    Her primary care physician is Dr. Lucienne Capers.  Social History:  Stephanie [name] was born and grew up in [place]. She has 3 older brothers. She never suffered any abuse. Her father died when she was 25 years of age. She completed her bachelor of science in nursing in 1989, and has been working as an Charity fundraiser.  She has been married for 21 years, and she and her husband have a 39 year old son and a 32 year old daughter. She affiliates as Control and instrumentation engineer. She denies any legal involvement. She enjoys hiking and reading. She reports her social support consists of friends from college, and a friend from church.   Family History:  Family History   Problem  Relation  Age of Onset   .  ADD / ADHD  Son    .  ADD / ADHD  Daughter    .  Depression  Brother       Outpatient Encounter Prescriptions as of 03/13/2013  Medication Sig  . atomoxetine (STRATTERA) 40 MG capsule Take 1 capsule (40 mg total) by mouth daily.  Marland Kitchen FLUoxetine (PROZAC) 20 MG tablet Take 1 tablet (20 mg total) by mouth daily.  . norethindrone-ethinyl estradiol (JUNEL FE,GILDESS FE,LOESTRIN FE) 1-20 MG-MCG tablet Take 1 tablet by mouth daily.  . [DISCONTINUED] atomoxetine (STRATTERA) 25 MG capsule Take 1 capsule (25 mg total) by mouth 2 (two) times daily.  . [DISCONTINUED] amphetamine-dextroamphetamine (ADDERALL) 10 MG  tablet Take 1 tablet (10 mg total) by mouth 2 (two) times daily.  . [DISCONTINUED] amphetamine-dextroamphetamine (ADDERALL) 10 MG tablet Take 1 tablet (10 mg total) by mouth 2 (two) times daily.  . [DISCONTINUED] amphetamine-dextroamphetamine (ADDERALL) 10 MG tablet Take 1 tablet (10 mg total) by mouth 2 (two) times daily.   Past Psychiatric History/Hospitalization(s):  The patient has tried Adderall and vyvase which causes shakes.  She tried Wellbutrin which actually worked very well but she developed hives and rash.  Patient denies any history of  suicidal attempt, psychosis, hallucination or any paranoia. Anxiety: Yes  Bipolar Disorder: No  Depression: Yes  Mania: No  Psychosis: No  Schizophrenia: No  Personality Disorder: No  Hospitalization for psychiatric illness: No  History of Electroconvulsive Shock Therapy: No  Prior Suicide Attempts: No  Physical Exam: Constitutional:  BP 118/81  Pulse 111  Ht 5\' 3"  (1.6 m)  Wt 131 lb 12.8 oz (59.784 kg)  BMI 23.35 kg/m2  General Appearance: alert, oriented, no acute distress and well nourished  Musculoskeletal: Strength & Muscle Tone: within normal limits Gait & Station: normal Patient leans: N/A  Psychiatric: Speech (describe rate, volume, coherence, spontaneity, and abnormalities if any): clear and coherent at a regular rate and rhythm and normal volume  Thought Process (describe rate, content, abstract reasoning, and computation): within normal limits  Associations: Intact  Thoughts: normal  Mental Status: Orientation: oriented to person, place, time/date and situation Mood & Affect: normal affect Attention Span & Concentration: intact  Medical Decision Making (Choose Three): Established Problem, Stable/Improving (1), Review of Psycho-Social Stressors (1), Decision to obtain old records (1), Review and summation of old records (2), Established Problem, Worsening (2), Review of Medication Regimen & Side Effects (2) and Review of New Medication or Change in Dosage (2)  Assessment: Axis I: ADHD, inattentive type; generalized anxiety disorder; depressive disorder NOS  Axis II: deferred  Axis III: osteopenia  Axis IV: mild  Axis V: 60   Plan:  I review her history, chart, psychosocial stressors in current medication.  I recommend to increase Strattera to 40 mg daily.  We will get the results from her OB/GYN.  Recommend to continue Prozac 20 mg daily.  Discuss in detail the risks and benefits of medication.  Followup in 2 months.  Time spent 25 minutes.  More than  50% of the time spent in psychoeducation, counseling and coordination of care.  Discuss safety plan that anytime having active suicidal thoughts or homicidal thoughts then patient need to call 911 or go to the local emergency room.  ARFEEN,SYED T., MD 03/13/2013

## 2013-05-15 ENCOUNTER — Encounter (HOSPITAL_COMMUNITY): Payer: Self-pay | Admitting: Psychiatry

## 2013-05-15 ENCOUNTER — Ambulatory Visit (INDEPENDENT_AMBULATORY_CARE_PROVIDER_SITE_OTHER): Payer: 59 | Admitting: Psychiatry

## 2013-05-15 VITALS — BP 137/61 | HR 90 | Ht 63.43 in | Wt 135.4 lb

## 2013-05-15 DIAGNOSIS — F329 Major depressive disorder, single episode, unspecified: Secondary | ICD-10-CM

## 2013-05-15 DIAGNOSIS — F3289 Other specified depressive episodes: Secondary | ICD-10-CM

## 2013-05-15 MED ORDER — FLUOXETINE HCL 20 MG PO TABS: 20.0000 mg | ORAL_TABLET | Freq: Every day | ORAL | Status: DC

## 2013-05-15 NOTE — Progress Notes (Signed)
[place] Progress Note  Stephanie [name] [id number] 48 y.o.  05/15/2013 3:30 PM  Chief Complaint:  I stopped taking the Strattera.  It was not helping me.      History of Present Illness: Stephanie [name] came for her followup appointment.  She stopped taking the Strattera because she does not feel it was helping her.  She is taking Prozac 20 mg.  She denies any side effects of medication.  She is studying and recently took an exam and enrolled in masters in nursing.  Patient isn't a very anxious but she has no problem with sleep.  Her attention focus is good.  She has no tremors or shakes.  She was to continue Prozac 20 mg daily.  She denies any agitation, anger or any depressive thoughts.  She has no hallucinations or any paranoia.  Suicidal Ideation: No Plan Formed: No Patient has means to carry out plan: No  Homicidal Ideation: No Plan Formed: No Patient has means to carry out plan: No  Review of Systems: Psychiatric: Agitation: No Hallucination: No Depressed Mood: Yes Insomnia: No Hypersomnia: No Altered Concentration: Yes Feels Worthless: No Grandiose Ideas: No Belief In Special Powers: No New/Increased Substance Abuse: No Compulsions: No  Neurologic: Headache: No Seizure: No Paresthesias: No  Past Medical History:  Past Medical History   Diagnosis  Date   .  Osteopenia    Her primary care physician is Dr. Consuela Mimes.  Social History:  Stephanie [name] was born and grew up in [place] [facility]. She has 3 older brothers. She never suffered any abuse. Her father died when she was 69 years of age. She completed her bachelor of science in nursing in 1989, and has been working as an Therapist, sports.  She has been married for 21 years, and she and her husband have a 49 year old son and a 38 year old daughter. She affiliates as Psychologist, forensic. She denies any legal involvement. She enjoys hiking and reading. She reports her social support consists of friends from college, and a friend  from church.   Family History:  Family History   Problem  Relation  Age of Onset   .  ADD / ADHD  Son    .  ADD / ADHD  Daughter    .  Depression  Brother       Outpatient Encounter Prescriptions as of 05/15/2013  Medication Sig  . FLUoxetine (PROZAC) 20 MG tablet Take 1 tablet (20 mg total) by mouth daily.  . norethindrone-ethinyl estradiol (JUNEL FE,GILDESS FE,LOESTRIN FE) 1-20 MG-MCG tablet Take 1 tablet by mouth daily.  . [DISCONTINUED] FLUoxetine (PROZAC) 20 MG tablet Take 1 tablet (20 mg total) by mouth daily.  . [DISCONTINUED] atomoxetine (STRATTERA) 40 MG capsule Take 1 capsule (40 mg total) by mouth daily.   Past Psychiatric History/Hospitalization(s):  The patient has tried Adderall and vyvase which causes shakes.  She tried Wellbutrin which actually worked very well but she developed hives and rash.  Patient denies any history of suicidal attempt, psychosis, hallucination or any paranoia. Anxiety: Yes  Bipolar Disorder: No  Depression: No   Mania: No  Psychosis: No  Schizophrenia: No  Personality Disorder: No  Hospitalization for psychiatric illness: No  History of Electroconvulsive Shock Therapy: No  Prior Suicide Attempts: No  Physical Exam: Constitutional:  BP 137/61  Pulse 90  Ht 5' 3.43" (1.611 m)  Wt 135 lb 6.4 oz (61.417 kg)  BMI 23.66 kg/m2  General Appearance: alert, oriented, no acute distress and well nourished  Musculoskeletal: Strength & Muscle Tone: within normal limits Gait & Station: normal Patient leans: N/A  Psychiatric: Speech (describe rate, volume, coherence, spontaneity, and abnormalities if any): clear and coherent at a regular rate and rhythm and normal volume  Thought Process (describe rate, content, abstract reasoning, and computation): within normal limits  Associations: Intact  Thoughts: normal  Mental Status: Orientation: oriented to person, place, time/date and situation Mood & Affect: normal affect Attention Span &  Concentration: intact  Medical Decision Making (Choose Three): Established Problem, Stable/Improving (1) and Review of Medication Regimen & Side Effects (2)  Assessment: Axis I: ADHD, inattentive type; generalized anxiety disorder; depressive disorder NOS  Axis II: deferred  Axis III: osteopenia  Axis IV: mild  Axis V: 60   Plan:  Continue Prozac 20 mg daily.  I will discontinue Strattera since patient is not taking.  Followup in 3 months.  Recommend to call us back if she has any question or any concern.  Azarya Oconnell T., MD 05/15/2013

## 2013-07-03 ENCOUNTER — Other Ambulatory Visit (HOSPITAL_COMMUNITY): Payer: Self-pay | Admitting: Psychiatry

## 2013-07-03 ENCOUNTER — Telehealth (HOSPITAL_COMMUNITY): Payer: Self-pay | Admitting: *Deleted

## 2013-07-03 NOTE — Telephone Encounter (Signed)
RA:XENMMH a lot of trouble with focus,especially on schoolwork for grad school.Sat/Sun took short acting Adderall from past RX.Helped,but gave her headache.Needs something short acting.Recommendations? Willing to to take Adderall again,even w/headache.

## 2013-07-10 ENCOUNTER — Telehealth (HOSPITAL_COMMUNITY): Payer: Self-pay | Admitting: *Deleted

## 2013-07-10 DIAGNOSIS — F988 Other specified behavioral and emotional disorders with onset usually occurring in childhood and adolescence: Secondary | ICD-10-CM

## 2013-07-10 MED ORDER — AMPHETAMINE-DEXTROAMPHETAMINE 10 MG PO TABS: 10.0000 mg | ORAL_TABLET | Freq: Every day | ORAL | Status: DC

## 2013-07-10 NOTE — Telephone Encounter (Signed)
Patient left CE:YEMV message last week about restarting Adderall 10 mg.Has not heard from anyone. Called patient back with following information: Informed patient that Dr. Adele Schilder will give Adderall 10 mg, #30.

## 2013-08-14 ENCOUNTER — Encounter (HOSPITAL_COMMUNITY): Payer: Self-pay | Admitting: Psychiatry

## 2013-08-14 ENCOUNTER — Ambulatory Visit (INDEPENDENT_AMBULATORY_CARE_PROVIDER_SITE_OTHER): Payer: 59 | Admitting: Psychiatry

## 2013-08-14 VITALS — BP 136/63 | HR 95 | Ht 63.5 in | Wt 133.8 lb

## 2013-08-14 DIAGNOSIS — F3289 Other specified depressive episodes: Secondary | ICD-10-CM

## 2013-08-14 DIAGNOSIS — F329 Major depressive disorder, single episode, unspecified: Secondary | ICD-10-CM

## 2013-08-14 DIAGNOSIS — F988 Other specified behavioral and emotional disorders with onset usually occurring in childhood and adolescence: Secondary | ICD-10-CM

## 2013-08-14 DIAGNOSIS — F411 Generalized anxiety disorder: Secondary | ICD-10-CM

## 2013-08-14 MED ORDER — AMPHETAMINE-DEXTROAMPHETAMINE 10 MG PO TABS: 10.0000 mg | ORAL_TABLET | Freq: Every day | ORAL | Status: DC

## 2013-08-14 MED ORDER — FLUOXETINE HCL 20 MG PO TABS: 20.0000 mg | ORAL_TABLET | Freq: Every day | ORAL | Status: DC

## 2013-08-14 NOTE — Progress Notes (Signed)
Stephanie [name] Progress Note  Stephanie [name] [id number] 48 y.o.  08/14/2013 3:30 PM  Chief Complaint:  I'm taking Adderall it is helping my attention and focus.        History of Present Illness: Stephanie [name] came for her followup appointment.  We stopped her Strattera because it was not helping her.  She was given Adderall 10 mg is helping her attention focus and she is able to multitasking.  Sometimes she complained of insomnia but overall she has no problem.  She generally chest pain or any anxiety attack.  She has no tremors or shakes.  She is compliant with Prozac.  She was disappointed about her weight .  She was hoping that she will lose a lot of weight Adderall.  Patient is not drinking or using any other substances.    Suicidal Ideation: No Plan Formed: No Patient has means to carry out plan: No  Homicidal Ideation: No Plan Formed: No Patient has means to carry out plan: No  Review of Systems: Psychiatric: Agitation: No Hallucination: No Depressed Mood: No Insomnia: No Hypersomnia: No Altered Concentration: No Feels Worthless: No Grandiose Ideas: No Belief In Special Powers: No New/Increased Substance Abuse: No Compulsions: No  Neurologic: Headache: No Seizure: No Paresthesias: No  Past Medical History:  Past Medical History   Diagnosis  Date   .  Osteopenia    Her primary care physician is Dr. Consuela [name].  Social History:  Stephanie [name] was born and grew up in [facility] [place]. She has 3 older brothers. She never suffered any abuse. Her father died when she was 82 years of age. She completed her bachelor of science in nursing in 1989, and has been working as an Therapist, sports.  She has been married for 21 years, and she and her husband have a 66 year old son and a 85 year old daughter. She affiliates as Psychologist, forensic. She denies any legal involvement. She enjoys hiking and reading. She reports her social support consists of friends from college, and a friend from  church.   Family History:  Family History   Problem  Relation  Age of Onset   .  ADD / ADHD  Son    .  ADD / ADHD  Daughter    .  Depression  Brother       Outpatient Encounter Prescriptions as of 08/14/2013  Medication Sig  . amphetamine-dextroamphetamine (ADDERALL) 10 MG tablet Take 1 tablet (10 mg total) by mouth daily.  Marland Kitchen FLUoxetine (PROZAC) 20 MG tablet Take 1 tablet (20 mg total) by mouth daily.  . norethindrone-ethinyl estradiol (JUNEL FE,GILDESS FE,LOESTRIN FE) 1-20 MG-MCG tablet Take 1 tablet by mouth daily.  . [DISCONTINUED] amphetamine-dextroamphetamine (ADDERALL) 10 MG tablet Take 1 tablet (10 mg total) by mouth daily.  . [DISCONTINUED] amphetamine-dextroamphetamine (ADDERALL) 10 MG tablet Take 1 tablet (10 mg total) by mouth daily.  . [DISCONTINUED] FLUoxetine (PROZAC) 20 MG tablet Take 1 tablet (20 mg total) by mouth daily.   Past Psychiatric History/Hospitalization(s):  The patient has tried Adderall and vyvase which causes shakes.  She tried Wellbutrin which actually worked very well but she developed hives and rash.  Patient denies any history of suicidal attempt, psychosis, hallucination or any paranoia. Anxiety: Yes  Bipolar Disorder: No  Depression: No   Mania: No  Psychosis: No  Schizophrenia: No  Personality Disorder: No  Hospitalization for psychiatric illness: No  History of Electroconvulsive Shock Therapy: No  Prior Suicide Attempts: No  Physical Exam: Constitutional:  BP 136/63  Pulse 95  Ht 5' 3.5" (1.613 m)  Wt 133 lb 12.8 oz (60.691 kg)  BMI 23.33 kg/m2  General Appearance: alert, oriented, no acute distress and well nourished  Musculoskeletal: Strength & Muscle Tone: within normal limits Gait & Station: normal Patient leans: N/A  Psychiatric: Speech (describe rate, volume, coherence, spontaneity, and abnormalities if any): clear and coherent at a regular rate and rhythm and normal volume  Thought Process (describe rate, content, abstract  reasoning, and computation): within normal limits  Associations: Intact  Thoughts: normal  Mental Status: Orientation: oriented to person, place, time/date and situation Mood & Affect: normal affect Attention Span & Concentration: intact  Medical Decision Making (Choose Three): Established Problem, Stable/Improving (1) and Review of Medication Regimen & Side Effects (2)  Assessment: Axis I: ADHD, inattentive type; generalized anxiety disorder; depressive disorder NOS  Axis II: deferred  Axis III: osteopenia  Axis IV: mild  Axis V: 60   Plan:  Recommended to watch calorie intake and to remember exercise.  I will continue Prozac 20 mg daily and Adderall 10 mg in the morning.  Discussed risks and benefits of medication.  Recommended to call us back if she has any question or any concern.  Followup in 3 months.  Delton Stelle T., MD 08/14/2013

## 2013-11-13 ENCOUNTER — Ambulatory Visit (HOSPITAL_COMMUNITY): Payer: Self-pay | Admitting: Psychiatry

## 2014-06-04 ENCOUNTER — Other Ambulatory Visit (HOSPITAL_COMMUNITY): Payer: Self-pay | Admitting: Psychiatry

## 2014-06-05 NOTE — Telephone Encounter (Signed)
Chart reviewed.  Patient last seen 08/14/13.  No showed for appointment 11/13/13 and is scheduled for a new evaluation on 06/06/14.  No refills authorized until patient's next evaluation per protocol.

## 2014-06-06 ENCOUNTER — Encounter (HOSPITAL_COMMUNITY): Payer: Self-pay | Admitting: Psychiatry

## 2014-06-06 ENCOUNTER — Ambulatory Visit (INDEPENDENT_AMBULATORY_CARE_PROVIDER_SITE_OTHER): Payer: 59 | Admitting: Psychiatry

## 2014-06-06 VITALS — BP 102/73 | HR 86 | Ht 63.0 in | Wt 140.2 lb

## 2014-06-06 DIAGNOSIS — F331 Major depressive disorder, recurrent, moderate: Secondary | ICD-10-CM

## 2014-06-06 DIAGNOSIS — F900 Attention-deficit hyperactivity disorder, predominantly inattentive type: Secondary | ICD-10-CM

## 2014-06-06 DIAGNOSIS — F988 Other specified behavioral and emotional disorders with onset usually occurring in childhood and adolescence: Secondary | ICD-10-CM

## 2014-06-06 MED ORDER — LISDEXAMFETAMINE DIMESYLATE 20 MG PO CAPS: 20.0000 mg | ORAL_CAPSULE | Freq: Every day | ORAL | Status: DC

## 2014-06-06 MED ORDER — FLUOXETINE HCL 20 MG PO TABS: 20.0000 mg | ORAL_TABLET | Freq: Every day | ORAL | Status: DC

## 2014-06-06 NOTE — Progress Notes (Signed)
[place] [id number] Progress Note  Stephanie [name] [id number] 49 y.o.  06/06/2014 3:30 PM  Chief Complaint:  I'm feeling sad and depressed.  I have stopped taking medication 10 months ago because I thought I'm gaining weight.          History of Present Illness: Stephanie [name] came for her followup appointment.  She was seen in April 2015 .  She stopped taking medication 2 months later because she felt she was getting weight from Prozac.  In past few months her depression started to get worse.  She is feeling very isolated, poor sleep, irritability, anxiety and feeling of hopelessness and worthlessness.  She also admitted having crying spells and passive and fleeting suicidal thoughts but no plan.  Her major stressors are graduate school, going through heart flashes and menopause and raising 17 year old son who is very defiant.  Patient has support from her husband but sometime she gets very frustrated and irritable.  She sleeping on and off and she admitted heart flashes.  She is scheduled to see her OB/GYN for further workup.  Now she realized that stopping Prozac did not help her weight loss and she continued to gain weight.  She denies any paranoia, hallucination, aggression or violence.  She admitted taking old Vyvanse which was given by physician assistant few years ago and Prozac 3 days ago.  She noticed some improvement in her attention focus and depression.  Patient is working and [employer] and she reported her job is going very well.  Patient denies drinking or using any illegal substances.  She lives with her 66 year old son and husband.  Her daughter is in college .  Suicidal Ideation: Passive and fleeting suicidal thoughts but no plan Plan Formed: No Patient has means to carry out plan: No  Homicidal Ideation: No Plan Formed: No Patient has means to carry out plan: No  Review of Systems  Constitutional: Positive for malaise/fatigue.       Feeling tired   Psychiatric/Behavioral: Positive for depression. The patient is nervous/anxious and has insomnia.      Psychiatric: Agitation: No Hallucination: No Depressed Mood: Yes Insomnia: Yes Hypersomnia: No Altered Concentration: No Feels Worthless: Yes Grandiose Ideas: No Belief In Special Powers: No New/Increased Substance Abuse: No Compulsions: No  Neurologic: Headache: No Seizure: No Paresthesias: No  Past Medical History:  Past Medical History   Diagnosis  Date   .  Osteopenia    Her primary care physician is Dr. Consuela Mimes.  Social History:  [name] was born and grew up in [facility]. She has 3 older brothers. She never suffered any abuse. Her father died when she was 32 years of age. She completed her bachelor of science in nursing in 1989, and has been working as an Therapist, sports.  She has been married for 21 years. , and she and her husband have a 37 year old son and a 9 year old  Family History:  Family History   Problem  Relation  Age of Onset   .  ADD / ADHD  Son    .  ADD / ADHD  Daughter    .  Depression  Brother       Outpatient Encounter Prescriptions as of 06/06/2014  Medication Sig  . norethindrone-ethinyl estradiol (JUNEL FE,GILDESS FE,LOESTRIN FE) 1-20 MG-MCG tablet Take 1 tablet by mouth daily.  Marland Kitchen FLUoxetine (PROZAC) 20 MG tablet Take 1 tablet (20 mg total) by mouth daily.  Marland Kitchen lisdexamfetamine (VYVANSE) 20 MG capsule Take 1 capsule (  20 mg total) by mouth daily.  . [DISCONTINUED] amphetamine-dextroamphetamine (ADDERALL) 10 MG tablet Take 1 tablet (10 mg total) by mouth daily. (Patient not taking: Reported on 06/06/2014)  . [DISCONTINUED] FLUoxetine (PROZAC) 20 MG tablet Take 1 tablet (20 mg total) by mouth daily. (Patient not taking: Reported on 06/06/2014)   Past Psychiatric History/Hospitalization(s):  The patient has tried Adderall and vyvase which causes shakes.  She tried Wellbutrin which actually worked very well but she developed hives and rash.   Patient denies any history of suicidal attempt, psychosis, hallucination or any paranoia. Anxiety: Yes  Bipolar Disorder: No  Depression: No   Mania: No  Psychosis: No  Schizophrenia: No  Personality Disorder: No  Hospitalization for psychiatric illness: No  History of Electroconvulsive Shock Therapy: No  Prior Suicide Attempts: No  Physical Exam: Constitutional:  BP 102/73 mmHg  Pulse 86  Ht 5\' 3"  (1.6 m)  Wt 140 lb 3.2 oz (63.594 kg)  BMI 24.84 kg/m2  General Appearance: well nourished  Musculoskeletal: Strength & Muscle Tone: within normal limits Gait & Station: normal Patient leans: N/A  Mental status examination Patient is casually dressed and fairly groomed.  She appears anxious nervous .  She described her mood depressed and sad and her affect is constricted.  Her attention and concentration is fair.  She endorsed passive and fleeting suicidal thoughts but no plan.  She denies any paranoia, hallucination or any obsessive thoughts.  She has no delusions.  Her psychomotor activity is slightly increased.  She is easily emotional.  Her fund of knowledge is good.  She denies any homicidal thoughts.  There were no flight of ideas or any loose association.  She's alert and oriented 3.  Her cognition is good.  Her insight judgment and impulse control is okay.  Established Problem, Stable/Improving (1), Self-Limited or Minor (1), Review of Psycho-Social Stressors (1), Review and summation of old records (2), Established Problem, Worsening (2), Review of Last Therapy Session (1), Review of Medication Regimen & Side Effects (2) and Review of New Medication or Change in Dosage (2)  Assessment: Axis I: ADHD, inattentive type; major depressive disorder, recurrent   Axis II: deferred  Axis III: osteopenia  Plan:  I review her symptoms, history and collateral information.  She has not seen since April 2015 and not taking her medication for more than 10 months.  We will start Prozac 20  mg which she used to take in the past.  She has noticed increased anxiety with Adderall and she liked to go back on Vyvanse 20 mg which she has taken in the past.  We'll start Vyvanse 20 mg daily .  Discussed medication side effects and benefits and efficacy.  I also encouraged to see her OB/GYN for further workup this patient is a right age for menopause.  Recommended to call us back if she has any question or any concern.  I will see her again in 3-4 weeks. Time spent 25 minutes.  More than 50% of the time spent in psychoeducation, counseling and coordination of care.  Discuss safety plan that anytime having active suicidal thoughts or homicidal thoughts then patient need to call 911 or go to the local emergency room.   Alvine Mostafa T., MD 06/06/2014

## 2014-07-05 ENCOUNTER — Encounter (HOSPITAL_COMMUNITY): Payer: Self-pay | Admitting: Psychiatry

## 2014-07-05 ENCOUNTER — Ambulatory Visit (INDEPENDENT_AMBULATORY_CARE_PROVIDER_SITE_OTHER): Payer: 59 | Admitting: Psychiatry

## 2014-07-05 VITALS — BP 110/74 | HR 80 | Ht 63.0 in | Wt 136.2 lb

## 2014-07-05 DIAGNOSIS — F909 Attention-deficit hyperactivity disorder, unspecified type: Secondary | ICD-10-CM

## 2014-07-05 DIAGNOSIS — F331 Major depressive disorder, recurrent, moderate: Secondary | ICD-10-CM

## 2014-07-05 DIAGNOSIS — F988 Other specified behavioral and emotional disorders with onset usually occurring in childhood and adolescence: Secondary | ICD-10-CM

## 2014-07-05 MED ORDER — LISDEXAMFETAMINE DIMESYLATE 20 MG PO CAPS: 20.0000 mg | ORAL_CAPSULE | Freq: Every day | ORAL | Status: DC

## 2014-07-05 MED ORDER — FLUOXETINE HCL 20 MG PO TABS: 20.0000 mg | ORAL_TABLET | Freq: Every day | ORAL | Status: DC

## 2014-07-05 NOTE — Progress Notes (Signed)
Stephanie [name]  Stephanie [name] [id number] 49 y.o.  07/05/2014 3:30 PM  Chief Complaint:  I am feeling better.  History of Present Illness: Stephanie [name] for Stephanie followup appointment.  She is taking Prozac and Vyvanse.  She is less depressed and less tearful.  Stephanie sleep is much improved.  She denies any side effects of medication.  She scheduled to see Stephanie OB/GYN next month but since she started Prozac and Vyvanse Stephanie crying spells are resolved.  She is handling better Stephanie 22 year old [name].  Stephanie [name] is seeing Dr. Dwyane Dee.  His medicine is changed.  Stephanie [name] reported no issues with Stephanie current medication.  Stephanie attention, concentration is improved.  She denies any suicidal thoughts or homicidal thoughts.  Stephanie appetite is okay and Stephanie vitals are stable.  She has good energy level.  Stephanie [name] denies drinking or using any illegal substances.  Stephanie [name] lives with Stephanie [name] and Stephanie [name].  Stephanie [name] is in the college.  Suicidal Ideation: No Plan Formed: No Stephanie [name] has means to carry out plan: No  Homicidal Ideation: No Plan Formed: No Stephanie [name] has means to carry out plan: No  ROS   Psychiatric: Agitation: No Hallucination: No Depressed Mood: No Insomnia: No Hypersomnia: No Altered Concentration: No Feels Worthless: No Grandiose Ideas: No Belief In Special Powers: No New/Increased Substance Abuse: No Compulsions: No  Neurologic: Headache: No Seizure: No Paresthesias: No  Past Medical History:  Past Medical History   Diagnosis  Date   .  Osteopenia    Stephanie primary care physician is Dr. Consuela Mimes.  Outpatient Encounter Prescriptions as of 07/05/2014  Medication Sig  . FLUoxetine (PROZAC) 20 MG tablet Take 1 tablet (20 mg total) by mouth daily.  Marland Kitchen lisdexamfetamine (VYVANSE) 20 MG capsule Take 1 capsule (20 mg total) by mouth daily.  . [DISCONTINUED] FLUoxetine (PROZAC) 20 MG tablet Take 1 tablet (20 mg total) by mouth daily.  .  [DISCONTINUED] lisdexamfetamine (VYVANSE) 20 MG capsule Take 1 capsule (20 mg total) by mouth daily.  . [DISCONTINUED] lisdexamfetamine (VYVANSE) 20 MG capsule Take 1 capsule (20 mg total) by mouth daily.  . norethindrone-ethinyl estradiol (JUNEL FE,GILDESS FE,LOESTRIN FE) 1-20 MG-MCG tablet Take 1 tablet by mouth daily.   Past Psychiatric History/Hospitalization(s):  She had tried Adderall and Vyvanse which causes shakes.  She tried Wellbutrin which actually worked very well but she developed hives and rash.  Stephanie [name] denies any history of suicidal attempt, psychosis, hallucination or any paranoia. Anxiety: Yes  Bipolar Disorder: No  Depression: No   Mania: No  Psychosis: No  Schizophrenia: No  Personality Disorder: No  Hospitalization for psychiatric illness: No  History of Electroconvulsive Shock Therapy: No  Prior Suicide Attempts: No  Physical Exam: Constitutional:  BP 110/74 mmHg  Pulse 80  Ht 5\' 3"  (1.6 m)  Wt 136 lb 3.2 oz (61.78 kg)  BMI 24.13 kg/m2  LMP 01/25/2014 (Approximate)  General Appearance: well nourished  Musculoskeletal: Strength & Muscle Tone: within normal limits Gait & Station: normal Stephanie [name] leans: N/A  Mental status examination Stephanie [name] is casually dressed and fairly groomed.  She is pleasant and cooperative.  She described Stephanie mood good and Stephanie affect is improved from the past.  Stephanie attention and concentration is good.  She denies any active or passive suicidal thoughts or homicidal thought. She denies any paranoia, hallucination or any obsessive thoughts.  She has no delusions.  Stephanie psychomotor activity is normal.  Stephanie fund of knowledge  is good.  There were no flight of ideas or any loose association.  She's alert and oriented 3.  Stephanie cognition is good.  Stephanie insight judgment and impulse control is okay.  Established Problem, Stable/Improving (1), Review of Psycho-Social Stressors (1), Review of Last Therapy Session (1) and Review of Medication Regimen &  Side Effects (2)  Assessment: Axis I: ADHD, inattentive type; major depressive disorder, recurrent   Axis II: deferred  Axis III: osteopenia  Plan:  Stephanie [name] is doing better on Stephanie current medication.  Continue Prozac 20 mg daily and Vyvanse 20 mg daily.  She has no side effects.  She scheduled to see Stephanie OB/GYN next month for checkup.  I recommended to call us back if she has any question, concern.  Worsening of the symptom.  Follow-up in 2 months.  ARFEEN,SYED T., MD 07/05/2014

## 2014-07-09 ENCOUNTER — Other Ambulatory Visit (HOSPITAL_COMMUNITY): Payer: Self-pay | Admitting: Psychiatry

## 2014-07-10 ENCOUNTER — Other Ambulatory Visit (HOSPITAL_COMMUNITY): Payer: Self-pay | Admitting: Psychiatry

## 2014-07-10 NOTE — Telephone Encounter (Signed)
Given on 07/05/14 with refills

## 2014-08-09 ENCOUNTER — Other Ambulatory Visit (HOSPITAL_COMMUNITY): Payer: Self-pay | Admitting: Psychiatry

## 2014-08-10 ENCOUNTER — Other Ambulatory Visit (HOSPITAL_COMMUNITY): Payer: Self-pay | Admitting: Psychiatry

## 2014-08-10 NOTE — Telephone Encounter (Signed)
Medication refill request - Patient returns for evaluation 09/05/14.  New order plus one refill of Prozac was e-scribed 07/05/14 so refill declined due to too early at this time.

## 2014-09-05 ENCOUNTER — Encounter (HOSPITAL_COMMUNITY): Payer: Self-pay | Admitting: Psychiatry

## 2014-09-05 ENCOUNTER — Ambulatory Visit (INDEPENDENT_AMBULATORY_CARE_PROVIDER_SITE_OTHER): Payer: 59 | Admitting: Psychiatry

## 2014-09-05 VITALS — BP 106/70 | HR 86 | Ht 63.0 in | Wt 137.6 lb

## 2014-09-05 DIAGNOSIS — F411 Generalized anxiety disorder: Secondary | ICD-10-CM

## 2014-09-05 DIAGNOSIS — F339 Major depressive disorder, recurrent, unspecified: Secondary | ICD-10-CM

## 2014-09-05 DIAGNOSIS — F988 Other specified behavioral and emotional disorders with onset usually occurring in childhood and adolescence: Secondary | ICD-10-CM

## 2014-09-05 DIAGNOSIS — F900 Attention-deficit hyperactivity disorder, predominantly inattentive type: Secondary | ICD-10-CM | POA: Diagnosis not present

## 2014-09-05 MED ORDER — LISDEXAMFETAMINE DIMESYLATE 20 MG PO CAPS: 20.0000 mg | ORAL_CAPSULE | Freq: Every day | ORAL | Status: DC

## 2014-09-05 MED ORDER — FLUOXETINE HCL 20 MG PO CAPS: 20.0000 mg | ORAL_CAPSULE | Freq: Every day | ORAL | Status: DC

## 2014-09-05 NOTE — Progress Notes (Signed)
[place] Progress Note  Stephanie [name] [id number] 49 y.o.  09/05/2014 3:30 PM  Chief Complaint:  Medication management and follow-up.   History of Present Illness: Stephanie [name] came for her followup appointment.  She is compliant with Prozac and Vyvanse.  She reported improvement in her depression , focus, attention and anxiety symptoms.  She is tolerating the medication and there has been no concern.  Her sleep is good.  She is no longer taking birth control .  She is scheduled to see OB/GYN on 27.  Overall she feel improvement in her symptoms.  She denies any feeling of hopelessness or worthlessness.  Her appetite is okay.  Her vitals are stable.  She reported good energy level and socialization.  Patient denies drinking or using any illegal substances.  She lives with her husband and 26 year old son .  Suicidal Ideation: No Plan Formed: No Patient has means to carry out plan: No  Homicidal Ideation: No Plan Formed: No Patient has means to carry out plan: No  Review of Systems  Constitutional: Negative.   Musculoskeletal: Negative.   Skin: Negative.   Neurological: Positive for headaches. Negative for dizziness and tremors.  Psychiatric/Behavioral: Negative.    Psychiatric: Agitation: No Hallucination: No Depressed Mood: No Insomnia: No Hypersomnia: No Altered Concentration: No Feels Worthless: No Grandiose Ideas: No Belief In Special Powers: No New/Increased Substance Abuse: No Compulsions: No  Neurologic: Headache: No Seizure: No Paresthesias: No  Past Medical History:  Past Medical History   Diagnosis  Date   .  Osteopenia    Her primary care physician is Dr. Consuela Mimes.  Outpatient Encounter Prescriptions as of 09/05/2014  Medication Sig  . FLUoxetine (PROZAC) 20 MG capsule Take 1 capsule (20 mg total) by mouth daily.  Marland Kitchen lisdexamfetamine (VYVANSE) 20 MG capsule Take 1 capsule (20 mg total) by mouth daily.  . [DISCONTINUED] FLUoxetine  (PROZAC) 20 MG tablet Take 1 tablet (20 mg total) by mouth daily.  . [DISCONTINUED] lisdexamfetamine (VYVANSE) 20 MG capsule Take 1 capsule (20 mg total) by mouth daily.  . [DISCONTINUED] lisdexamfetamine (VYVANSE) 20 MG capsule Take 1 capsule (20 mg total) by mouth daily.  . [DISCONTINUED] lisdexamfetamine (VYVANSE) 20 MG capsule Take 1 capsule (20 mg total) by mouth daily.  . [DISCONTINUED] norethindrone-ethinyl estradiol (JUNEL FE,GILDESS FE,LOESTRIN FE) 1-20 MG-MCG tablet Take 1 tablet by mouth daily.   No facility-administered encounter medications on file as of 09/05/2014.   Past Psychiatric History/Hospitalization(s):  She had tried Adderall and Vyvanse which causes shakes.  She tried Wellbutrin which actually worked very well but she developed hives and rash.  Patient denies any history of suicidal attempt, psychosis, hallucination or any paranoia. Anxiety: Yes  Bipolar Disorder: No  Depression: No   Mania: No  Psychosis: No  Schizophrenia: No  Personality Disorder: No  Hospitalization for psychiatric illness: No  History of Electroconvulsive Shock Therapy: No  Prior Suicide Attempts: No  Physical Exam: Constitutional:  BP 106/70 mmHg  Pulse 86  Ht 5\' 3"  (1.6 m)  Wt 137 lb 9.6 oz (62.415 kg)  BMI 24.38 kg/m2  General Appearance: well nourished  Musculoskeletal: Strength & Muscle Tone: within normal limits Gait & Station: normal Patient leans: N/A  Mental status examination Patient is casually dressed and fairly groomed.  She is pleasant and cooperative.  She described her mood good and her affect is improved from the past.  Her attention and concentration is good.  She denies any active or passive suicidal thoughts  or homicidal thought. She denies any paranoia, hallucination or any obsessive thoughts.  She has no delusions.  Her psychomotor activity is normal.  Her fund of knowledge is good.  There were no flight of ideas or any loose association.  She's alert and oriented  3.  Her cognition is good.  Her insight judgment and impulse control is okay.  Established Problem, Stable/Improving (1), Review of Last Therapy Session (1) and Review of Medication Regimen & Side Effects (2)  Assessment: Axis I: ADHD, inattentive type; major depressive disorder, recurrent   Axis II: deferred  Axis III: osteopenia  Plan:  Patient is doing better on her current medication.  Continue Prozac 20 mg daily and Vyvanse 20 mg daily.  She has no side effects.  She does not ask for early refills of Vyvanse.  Recommended to call us back if she has any question or any concern.  Discuss safety concern that anytime having suicidal thoughts or homicidal thoughts and she need to call 911 or go to the local emergency room.  Follow-up in 3 months.  Nekeshia Lenhardt T., MD 09/05/2014

## 2014-09-06 ENCOUNTER — Other Ambulatory Visit: Payer: Self-pay

## 2014-09-06 DIAGNOSIS — Z1231 Encounter for screening mammogram for malignant neoplasm of breast: Secondary | ICD-10-CM

## 2014-09-11 ENCOUNTER — Ambulatory Visit
Admission: RE | Admit: 2014-09-11 | Discharge: 2014-09-11 | Disposition: A | Discharge status: Home / Self Care | Payer: 59 | Source: Ambulatory Visit

## 2014-09-11 ENCOUNTER — Other Ambulatory Visit: Payer: Self-pay

## 2014-09-11 DIAGNOSIS — Z1231 Encounter for screening mammogram for malignant neoplasm of breast: Secondary | ICD-10-CM

## 2014-12-10 ENCOUNTER — Ambulatory Visit (HOSPITAL_COMMUNITY): Payer: Self-pay | Admitting: Psychiatry

## 2014-12-21 ENCOUNTER — Other Ambulatory Visit (HOSPITAL_COMMUNITY): Payer: Self-pay | Admitting: Psychiatry

## 2014-12-24 ENCOUNTER — Other Ambulatory Visit (HOSPITAL_COMMUNITY): Payer: Self-pay | Admitting: Psychiatry

## 2014-12-24 DIAGNOSIS — F411 Generalized anxiety disorder: Secondary | ICD-10-CM

## 2014-12-24 MED ORDER — FLUOXETINE HCL 20 MG PO CAPS: 20.0000 mg | ORAL_CAPSULE | Freq: Every day | ORAL | Status: DC

## 2014-12-26 ENCOUNTER — Ambulatory Visit (INDEPENDENT_AMBULATORY_CARE_PROVIDER_SITE_OTHER): Payer: 59 | Admitting: Psychiatry

## 2014-12-26 ENCOUNTER — Encounter (HOSPITAL_COMMUNITY): Payer: Self-pay | Admitting: Psychiatry

## 2014-12-26 VITALS — BP 118/76 | HR 76 | Ht 63.0 in | Wt 141.8 lb

## 2014-12-26 DIAGNOSIS — F900 Attention-deficit hyperactivity disorder, predominantly inattentive type: Secondary | ICD-10-CM | POA: Diagnosis not present

## 2014-12-26 DIAGNOSIS — F988 Other specified behavioral and emotional disorders with onset usually occurring in childhood and adolescence: Secondary | ICD-10-CM

## 2014-12-26 DIAGNOSIS — F339 Major depressive disorder, recurrent, unspecified: Secondary | ICD-10-CM | POA: Diagnosis not present

## 2014-12-26 DIAGNOSIS — F411 Generalized anxiety disorder: Secondary | ICD-10-CM

## 2014-12-26 MED ORDER — LISDEXAMFETAMINE DIMESYLATE 20 MG PO CAPS: 20.0000 mg | ORAL_CAPSULE | Freq: Every day | ORAL | Status: DC

## 2014-12-26 MED ORDER — FLUOXETINE HCL 20 MG PO CAPS: 20.0000 mg | ORAL_CAPSULE | Freq: Every day | ORAL | Status: DC

## 2014-12-26 NOTE — Progress Notes (Signed)
Stephanie [name]  Stephanie [name] [id number] 49 y.o.  12/26/2014 3:30 PM  Chief Complaint:  Medication management and follow-up.   History of Present Illness: Stephanie [name] came for her followup appointment.  She has been off from her medication for 1 week because her appointments reschedule and she did not call for refills.  She has noticed lack of attention and focus .  She did remember when she was taking medication she is doing very well.  She was able to do multitasking.  She likes her medication.  She denies any irritability, crying spells or any feeling of hopelessness or worthlessness.  Her appetite is okay.  She denies any paranoia or any hallucination.  Her vitals are stable.  She lives with her husband and 58 year old son.  Her 72 year old son has ADD and behavior consistent with Asperger.  Patient told he is not formally diagnosed with Asperger and she is hoping to get evaluated very soon.  Patient told some time he has to struggle with him as he gets very upset and irritable.  Patient denies any side effects of medication.  She denies drinking or using any illegal substances.  Suicidal Ideation: No Plan Formed: No Patient has means to carry out plan: No  Homicidal Ideation: No Plan Formed: No Patient has means to carry out plan: No  Review of Systems  Constitutional: Negative.   Musculoskeletal: Negative.   Skin: Negative.   Neurological: Negative for dizziness and tremors.  Psychiatric/Behavioral: Negative.    Psychiatric: Agitation: No Hallucination: No Depressed Mood: No Insomnia: No Hypersomnia: No Altered Concentration: No Feels Worthless: No Grandiose Ideas: No Belief In Special Powers: No New/Increased Substance Abuse: No Compulsions: No  Neurologic: Headache: No Seizure: No Paresthesias: No  Past Medical History:  Past Medical History   Diagnosis  Date   .  Osteopenia    Her primary care physician is Dr. Consuela Mimes.  Outpatient Encounter Prescriptions as of 12/26/2014  Medication Sig  . FLUoxetine (PROZAC) 20 MG capsule Take 1 capsule (20 mg total) by mouth daily.  Marland Kitchen lisdexamfetamine (VYVANSE) 20 MG capsule Take 1 capsule (20 mg total) by mouth daily.  . [DISCONTINUED] FLUoxetine (PROZAC) 20 MG capsule Take 1 capsule (20 mg total) by mouth daily.  . [DISCONTINUED] lisdexamfetamine (VYVANSE) 20 MG capsule Take 1 capsule (20 mg total) by mouth daily.  . [DISCONTINUED] lisdexamfetamine (VYVANSE) 20 MG capsule Take 1 capsule (20 mg total) by mouth daily.  . [DISCONTINUED] lisdexamfetamine (VYVANSE) 20 MG capsule Take 1 capsule (20 mg total) by mouth daily.   No facility-administered encounter medications on file as of 12/26/2014.   Past Psychiatric History/Hospitalization(s):  She had tried Adderall and Vyvanse which causes shakes.  She tried Wellbutrin which actually worked very well but she developed hives and rash.  Patient denies any history of suicidal attempt, psychosis, hallucination or any paranoia. Anxiety: Yes  Bipolar Disorder: No  Depression: No   Mania: No  Psychosis: No  Schizophrenia: No  Personality Disorder: No  Hospitalization for psychiatric illness: No  History of Electroconvulsive Shock Therapy: No  Prior Suicide Attempts: No  Physical Exam: Constitutional:  BP 118/76 mmHg  Pulse 76  Ht 5\' 3"  (1.6 m)  Wt 141 lb 12.8 oz (64.32 kg)  BMI 25.13 kg/m2  General Appearance: well nourished  Musculoskeletal: Strength & Muscle Tone: within normal limits Gait & Station: normal Patient leans: N/A  Mental status examination Patient is casually dressed and fairly groomed.  She is  pleasant and cooperative.  She described her mood good and her affect is improved from the past.  Her attention and concentration is good.  She denies any active or passive suicidal thoughts or homicidal thought. She denies any paranoia, hallucination or any obsessive thoughts.  She has no delusions.   Her psychomotor activity is normal.  Her fund of knowledge is good.  There were no flight of ideas or any loose association.  She's alert and oriented 3.  Her cognition is good.  Her insight judgment and impulse control is okay.  Established Problem, Stable/Improving (1), Review of Last Therapy Session (1) and Review of Medication Regimen & Side Effects (2)  Assessment: Axis I: ADHD, inattentive type; major depressive disorder, recurrent   Axis II: deferred  Axis III: osteopenia  Plan:  Patient is doing better on her current medication.  Reinforce medication compliance and keep the follow-up appointments.  Patient does not have any concerns or side effects of medication.  I will continue Prozac 20 mg daily and Vyvanse 20 mg daily.  Recommended to call us back if she has any question or any concern.  Discuss safety concern that anytime having suicidal thoughts or homicidal thoughts and she need to call 911 or go to the local emergency room.  Follow-up in 3 months.  Lovett Coffin T., MD 12/26/2014

## 2015-03-27 ENCOUNTER — Ambulatory Visit (INDEPENDENT_AMBULATORY_CARE_PROVIDER_SITE_OTHER): Payer: 59 | Admitting: Psychiatry

## 2015-03-27 ENCOUNTER — Encounter (HOSPITAL_COMMUNITY): Payer: Self-pay | Admitting: Psychiatry

## 2015-03-27 VITALS — BP 100/64 | HR 100 | Ht 63.0 in | Wt 139.4 lb

## 2015-03-27 DIAGNOSIS — F909 Attention-deficit hyperactivity disorder, unspecified type: Secondary | ICD-10-CM

## 2015-03-27 DIAGNOSIS — F332 Major depressive disorder, recurrent severe without psychotic features: Secondary | ICD-10-CM | POA: Diagnosis not present

## 2015-03-27 DIAGNOSIS — F411 Generalized anxiety disorder: Secondary | ICD-10-CM

## 2015-03-27 DIAGNOSIS — F988 Other specified behavioral and emotional disorders with onset usually occurring in childhood and adolescence: Secondary | ICD-10-CM

## 2015-03-27 MED ORDER — LISDEXAMFETAMINE DIMESYLATE 20 MG PO CAPS: 20.0000 mg | ORAL_CAPSULE | Freq: Every day | ORAL | Status: DC

## 2015-03-27 MED ORDER — FLUOXETINE HCL 20 MG PO CAPS: 20.0000 mg | ORAL_CAPSULE | Freq: Every day | ORAL | Status: DC

## 2015-03-27 NOTE — Progress Notes (Signed)
[name] Progress Note  PACITA VENA [id number] 49 y.o.  03/27/2015 3:30 PM  Chief Complaint:  Medication management and follow-up.   History of Present Illness: Jimenna came for her followup appointment.  She is taking her medication as prescribed.  Recently she's seen her primary care physician and she had blood work.  She had high cortisol level and now she is taking Corticare B over-the-counter which is helping her mood.  She is pleased that her son finally got a formal diagnoses of Asperger.  She reported son is behaving better.  There has been no new incident.  Patient likes her current psychiatric medication.  She is able to do multitasking.  She denies any irritability, anger, mood swing.  Her appetite is okay.  Her vitals are stable.  She denies any crying spells or any feeling of hopelessness or worthlessness.  She denies drinking or using any illegal substances.  Suicidal Ideation: No Plan Formed: No Patient has means to carry out plan: No  Homicidal Ideation: No Plan Formed: No Patient has means to carry out plan: No  Review of Systems  Constitutional: Negative.   Musculoskeletal: Negative.   Skin: Negative.   Neurological: Negative for dizziness and tremors.  Psychiatric/Behavioral: Negative.    Psychiatric: Agitation: No Hallucination: No Depressed Mood: No Insomnia: No Hypersomnia: No Altered Concentration: No Feels Worthless: No Grandiose Ideas: No Belief In Special Powers: No New/Increased Substance Abuse: No Compulsions: No  Neurologic: Headache: No Seizure: No Paresthesias: No  Past Medical History:  Past Medical History   Diagnosis  Date   .  Osteopenia    Her primary care physician is Dr. Consuela Mimes.  Outpatient Encounter Prescriptions as of 03/27/2015  Medication Sig  . FLUoxetine (PROZAC) 20 MG capsule Take 1 capsule (20 mg total) by mouth daily.  Marland Kitchen lisdexamfetamine (VYVANSE) 20 MG capsule Take 1 capsule (20 mg  total) by mouth daily.  . meloxicam (MOBIC) 15 MG tablet Take 15 mg by mouth.  . [DISCONTINUED] estradiol (VIVELLE-DOT) 0.05 MG/24HR patch Place 1 patch onto the skin 2 (two) times a week.  . [DISCONTINUED] FLUoxetine (PROZAC) 20 MG capsule Take 1 capsule (20 mg total) by mouth daily.  . [DISCONTINUED] lisdexamfetamine (VYVANSE) 20 MG capsule Take 1 capsule (20 mg total) by mouth daily.  . [DISCONTINUED] lisdexamfetamine (VYVANSE) 20 MG capsule Take 1 capsule (20 mg total) by mouth daily.  . [DISCONTINUED] lisdexamfetamine (VYVANSE) 20 MG capsule Take 1 capsule (20 mg total) by mouth daily.  . Multiple Minerals-Vitamins (CAL-MAG-ZINC-D) TABS Take by mouth.   No facility-administered encounter medications on file as of 03/27/2015.   Past Psychiatric History/Hospitalization(s):  She had tried Adderall and Vyvanse which causes shakes.  She tried Wellbutrin which actually worked very well but she developed hives and rash.  Patient denies any history of suicidal attempt, psychosis, hallucination or any paranoia. Anxiety: Yes  Bipolar Disorder: No  Depression: No   Mania: No  Psychosis: No  Schizophrenia: No  Personality Disorder: No  Hospitalization for psychiatric illness: No  History of Electroconvulsive Shock Therapy: No  Prior Suicide Attempts: No  Physical Exam: Constitutional:  BP 100/64 mmHg  Pulse 100  Ht 5\' 3"  (1.6 m)  Wt 139 lb 6.4 oz (63.231 kg)  BMI 24.70 kg/m2  LMP 01/25/2014 (Approximate)  General Appearance: well nourished  Musculoskeletal: Strength & Muscle Tone: within normal limits Gait & Station: normal Patient leans: N/A  Mental status examination Patient is casually dressed and fairly groomed.  She is pleasant and cooperative.  She described her mood good and her affect is appropriate.  Her attention and concentration is good.  She denies any active or passive suicidal thoughts or homicidal thought. She denies any paranoia, hallucination or any obsessive  thoughts.  She has no delusions.  Her psychomotor activity is normal.  Her fund of knowledge is good.  There were no flight of ideas or any loose association.  She's alert and oriented 3.  Her cognition is good.  Her insight judgment and impulse control is okay.  Established Problem, Stable/Improving (1), Review of Last Therapy Session (1) and Review of Medication Regimen & Side Effects (2)  Assessment: Axis I: ADHD, inattentive type; major depressive disorder, recurrent   Axis II: deferred  Axis III: osteopenia  Plan:  Patient is doing better on her current medication.  She is also taking over-the-counter Corticare B to help reducing her cortisol levels and she has noticed improvement.  She does not want to change her Prozac and Vyvanse.  Patient does not have any concerns or side effects of medication.  I will continue Prozac 20 mg daily and Vyvanse 20 mg daily.  Recommended to call us back if she has any question or any concern.  Discuss safety concern that anytime having suicidal thoughts or homicidal thoughts and she need to call 911 or go to the local emergency room.  Follow-up in 3 months.  Ayako Tapanes T., MD 03/27/2015

## 2015-05-01 MED FILL — FLUoxetine HCL 20 MG CAPS: 20 | 30 days supply | Qty: 30 | Fill #0

## 2015-05-01 MED FILL — ESTRADIOL 0.05 MG PATCH: 0.05 | 28 days supply | Qty: 8 | Fill #0

## 2015-05-01 MED FILL — PROGESTERONE 100 MG CAPSULE: 100 | 30 days supply | Qty: 30 | Fill #2

## 2015-05-29 MED FILL — FLUoxetine HCL 20 MG CAPS: 20 | 30 days supply | Qty: 30 | Fill #1

## 2015-05-29 MED FILL — VYVANSE 20 MG CAPSULE: 20 | 30 days supply | Qty: 30 | Fill #0

## 2015-05-29 MED FILL — ESTRADIOL 0.05 MG PATCH: 0.05 | 28 days supply | Qty: 8 | Fill #1

## 2015-05-29 MED FILL — PROGESTERONE 100 MG CAPSULE: 100 | 30 days supply | Qty: 30 | Fill #3

## 2015-06-26 ENCOUNTER — Encounter (HOSPITAL_COMMUNITY): Payer: Self-pay | Admitting: Psychiatry

## 2015-06-26 ENCOUNTER — Ambulatory Visit (INDEPENDENT_AMBULATORY_CARE_PROVIDER_SITE_OTHER): Payer: 59 | Admitting: Psychiatry

## 2015-06-26 VITALS — BP 122/68 | HR 87 | Ht 63.0 in | Wt 142.2 lb

## 2015-06-26 DIAGNOSIS — F332 Major depressive disorder, recurrent severe without psychotic features: Secondary | ICD-10-CM

## 2015-06-26 DIAGNOSIS — F411 Generalized anxiety disorder: Secondary | ICD-10-CM

## 2015-06-26 DIAGNOSIS — F900 Attention-deficit hyperactivity disorder, predominantly inattentive type: Secondary | ICD-10-CM

## 2015-06-26 MED ORDER — FLUOXETINE HCL 20 MG PO CAPS: 20.0000 mg | ORAL_CAPSULE | Freq: Every day | ORAL | Status: DC

## 2015-06-26 MED FILL — FLUoxetine HCL 20 MG CAPS: 20 | 30 days supply | Qty: 30 | Fill #0

## 2015-06-26 NOTE — Progress Notes (Signed)
Stephanie [name] Progress Note  Stephanie [name] [id number] 50 y.o.  06/26/2015 3:30 PM  Chief Complaint:  I stop taking Vyvanse.  It was making me more irritable.     History of Present Illness: Stephanie [name] came for her followup appointment.  She stopped taking Vyvanse before Christmas.  She was very sick and having bronchitis and at that time she decided to stop Vyvanse and after few days when she started again she feels very irritable and having mood swings.  She mentioned her husband and son noticed irritability and she decided to stop permanent .  She is doing better.  She sleeping good .  She denies any irritability , anger, mood swing.  Sometimes she has difficulty multitasking but she does not feel that she needed to go back on medication.  She liked Prozac which is helping her depression and anxiety symptoms.  She had a very good Christmas other than she had been sick .  Patient mentioned that job is going very well.  She is trying to lose weight because her job requires sitting most of the time.  Patient denies drinking or using any illegal substances.  He denies any crying spells or any feeling of hopelessness or worthlessness.  Her appetite is okay.  Her vitals are stable.  She lives with her husband and her 18 year old son who has Asperger.  Suicidal Ideation: No Plan Formed: No Patient has means to carry out plan: No  Homicidal Ideation: No Plan Formed: No Patient has means to carry out plan: No  Review of Systems  Constitutional: Negative.   Musculoskeletal: Negative.   Skin: Negative.   Neurological: Negative for dizziness and tremors.  Psychiatric/Behavioral: Negative.    Psychiatric: Agitation: No Hallucination: No Depressed Mood: No Insomnia: No Hypersomnia: No Altered Concentration: No Feels Worthless: No Grandiose Ideas: No Belief In Special Powers: No New/Increased Substance Abuse: No Compulsions: No  Neurologic: Headache: No Seizure:  No Paresthesias: No  Past Medical History:  Past Medical History   Diagnosis  Date   .  Osteopenia    Her primary care physician is Dr. Consuela Mimes.  Outpatient Encounter Prescriptions as of 06/26/2015  Medication Sig  . estradiol (VIVELLE-DOT) 0.05 MG/24HR patch   . FLUoxetine (PROZAC) 20 MG capsule Take 1 capsule (20 mg total) by mouth daily.  . Multiple Minerals-Vitamins (CAL-MAG-ZINC-D) TABS Take by mouth.  . progesterone (PROMETRIUM) 100 MG capsule   . [DISCONTINUED] FLUoxetine (PROZAC) 20 MG capsule Take 1 capsule (20 mg total) by mouth daily.  . [DISCONTINUED] lisdexamfetamine (VYVANSE) 20 MG capsule Take 1 capsule (20 mg total) by mouth daily.  . [DISCONTINUED] meloxicam (MOBIC) 15 MG tablet Take 15 mg by mouth.   No facility-administered encounter medications on file as of 06/26/2015.   Past Psychiatric History/Hospitalization(s):  She had tried Adderall and Vyvanse which causes shakes.  She tried Wellbutrin which actually worked very well but she developed hives and rash.  Patient denies any history of suicidal attempt, psychosis, hallucination or any paranoia. Anxiety: Yes  Bipolar Disorder: No  Depression: No   Mania: No  Psychosis: No  Schizophrenia: No  Personality Disorder: No  Hospitalization for psychiatric illness: No  History of Electroconvulsive Shock Therapy: No  Prior Suicide Attempts: No  Physical Exam: Constitutional:  BP 122/68 mmHg  Pulse 87  Ht 5\' 3"  (1.6 m)  Wt 142 lb 3.2 oz (64.501 kg)  BMI 25.20 kg/m2  LMP 01/25/2014 (Approximate)  General Appearance: well nourished  Musculoskeletal: Strength &  Muscle Tone: within normal limits Gait & Station: normal Patient leans: N/A  Mental status examination Patient is casually dressed and fairly groomed.  She is pleasant and cooperative.  She described her mood good and her affect is appropriate.  Her attention and concentration is good.  She denies any active or passive suicidal thoughts or  homicidal thought. She denies any paranoia, hallucination or any obsessive thoughts.  She has no delusions.  Her psychomotor activity is normal.  Her fund of knowledge is good.  There were no flight of ideas or any loose association.  She's alert and oriented 3.  Her cognition is good.  Her insight judgment and impulse control is okay.  Established Problem, Stable/Improving (1), Review of Psycho-Social Stressors (1), Review of Last Therapy Session (1), Review of Medication Regimen & Side Effects (2) and Review of New Medication or Change in Dosage (2)  Assessment: Axis I: ADHD, inattentive type; major depressive disorder, recurrent   Axis II: deferred  Axis III: osteopenia  Plan:  I will discontinue Vyvanse since patient is not taking anymore.  She has no issues.  She wants to continue Prozac 20 mg daily.  She has no tremors shakes or any EPS. Recommended to call us back if she has any question or any concern.  Discuss safety concern that anytime having suicidal thoughts or homicidal thoughts and she need to call 911 or go to the local emergency room.  Follow-up in 3 months.  Stephanie [name] T., MD 06/26/2015

## 2015-06-28 ENCOUNTER — Other Ambulatory Visit: Payer: Self-pay | Admitting: Nurse Practitioner

## 2015-06-28 DIAGNOSIS — N644 Mastodynia: Secondary | ICD-10-CM

## 2015-07-02 MED FILL — PROGESTERONE 100 MG CAPSULE: 100 | 30 days supply | Qty: 30 | Fill #4

## 2015-07-02 MED FILL — ESTRADIOL 0.05 MG PATCH: 0.05 | 28 days supply | Qty: 8 | Fill #2

## 2015-07-04 ENCOUNTER — Ambulatory Visit
Admission: RE | Admit: 2015-07-04 | Discharge: 2015-07-04 | Disposition: A | Discharge status: Home / Self Care | Payer: 59 | Source: Ambulatory Visit | Attending: Nurse Practitioner | Admitting: Nurse Practitioner

## 2015-07-04 ENCOUNTER — Other Ambulatory Visit: Payer: Self-pay | Admitting: Nurse Practitioner

## 2015-07-04 DIAGNOSIS — N644 Mastodynia: Secondary | ICD-10-CM

## 2015-07-04 DIAGNOSIS — R921 Mammographic calcification found on diagnostic imaging of breast: Secondary | ICD-10-CM | POA: Diagnosis not present

## 2015-07-04 DIAGNOSIS — N6489 Other specified disorders of breast: Secondary | ICD-10-CM | POA: Diagnosis not present

## 2015-07-09 ENCOUNTER — Other Ambulatory Visit: Payer: Self-pay | Admitting: Nurse Practitioner

## 2015-07-09 DIAGNOSIS — R921 Mammographic calcification found on diagnostic imaging of breast: Secondary | ICD-10-CM

## 2015-07-10 ENCOUNTER — Ambulatory Visit
Admission: RE | Admit: 2015-07-10 | Discharge: 2015-07-10 | Disposition: A | Discharge status: Home / Self Care | Payer: 59 | Source: Ambulatory Visit | Attending: Nurse Practitioner | Admitting: Nurse Practitioner

## 2015-07-10 DIAGNOSIS — N6012 Diffuse cystic mastopathy of left breast: Secondary | ICD-10-CM | POA: Diagnosis not present

## 2015-07-10 DIAGNOSIS — R921 Mammographic calcification found on diagnostic imaging of breast: Secondary | ICD-10-CM

## 2015-07-10 HISTORY — PX: BREAST BIOPSY: SHX20

## 2015-07-22 ENCOUNTER — Ambulatory Visit: Payer: Self-pay | Admitting: Surgery

## 2015-07-22 DIAGNOSIS — N63 Unspecified lump in breast: Secondary | ICD-10-CM | POA: Diagnosis not present

## 2015-07-22 DIAGNOSIS — N6320 Unspecified lump in the left breast, unspecified quadrant: Secondary | ICD-10-CM

## 2015-07-22 NOTE — H&P (Signed)
Stephanie [name] 07/22/2015 2:35 PM Location: [facility] Patient #: [id number] DOB: 1965/06/15 Undefined / Language: Stephanie [name] / Race: White Female  History of Present Illness Stephanie Moores [name] MD; 07/22/2015 3:08 PM) Patient words: left breast mass   Patient sent at the request of Dr. Brigitte [name] for abnormal mammogram. She was found to have a cluster of left breast microcalcifications and changes that have been following for a number of years. 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 There is also a background of fibrocystic change. She was sent at the request of Dr. Blenda [name] consultation for abnormal mammogram and atypical apical hyperplasia. She has a history of fibrocystic change but her symptoms have improved in menopause. She has sore area in her left breast. No family history of breast cancer          Breast, left, needle core biopsy, upper, outer quadrant - ATYPICAL APOCRINE HYPERPLASIA. - FIBROCYSTIC CHANGES WITH ADENOSIS AND CALCIFICATIONS. - SEE COMMENT.      ADDENDUM REPORT: 07/11/2015 14:24 ADDENDUM: Pathology revealed FIBROCYSTIC CHANGES WITH ADENOSIS AND CALCIFICATIONS AND ATYPICAL APOCRINE HYPERPLASIA. Excision is recommended. This was found to be concordant by Dr. Pamelia [name]. Pathology results were discussed with the patient by telephone. The patient reported tenderness and is doing well after the biopsy. Post biopsy instructions and care were reviewed and questions were answered. The patient was encouraged to call [facility] for any additional concerns. Surgical consultation has been arranged with Dr. Erroll [name] at [facility] on July 22, 2015. Pathology results reported by Stephanie Purser, RN on 07/11/2015. Electronically Signed By: Stephanie [name] M.D.               CLINICAL DATA: Patient presents for evaluation of focal  tenderness within the medial left breast. EXAM: DIGITAL DIAGNOSTIC BILATERAL MAMMOGRAM WITH 3D TOMOSYNTHESIS WITH CAD ULTRASOUND LEFT BREAST COMPARISON: Previous exam(s). ACR Breast Density Category b: There are scattered areas of fibroglandular density. FINDINGS: CC and MLO tomosynthesis images of the right left breast as well as magnification CC and true lateral images of the left breast were obtained. Within the upper-outer left breast there is a 7.5 cm area of linearly oriented punctate calcifications, further evaluated with magnification views. No suspicious abnormalities identified within the right breast. Mammographic images were processed with CAD. On physical exam, I palpate no discrete mass within the medial left breast. Targeted ultrasound is performed, showing normal dense tissue within the medial left breast without suspicious mass. IMPRESSION: Suspicious calcifications upper-outer left breast. No suspicious abnormality identified within the medial left breast to correspond with patient's area of focal tenderness. RECOMMENDATION: Stereotactic guided core needle biopsy left breast calcifications. Biopsy has been scheduled for 07/10/2014. I have discussed the findings and recommendations with the patient. Results were also provided in writing at the conclusion of the visit. If applicable, a reminder letter will be sent to the patient regarding the next appointment. BI-RADS CATEGORY 4: Suspicious. Electronically Signed By: Stephanie [name] M.D. On: 07/04/2015 09:53 .  The patient is a 50 year old female.   Other Problems Stephanie [name], [place]; 07/22/2015 2:36 PM) Anxiety Disorder Depression Migraine Headache  Past Surgical History Stephanie [name], CMA; 07/22/2015 2:36 PM) Breast Biopsy Left. Tonsillectomy  Diagnostic Studies History Stephanie [name], [place]; 07/22/2015 2:36 PM) Colonoscopy never Mammogram within last year Pap Smear 1-5 years  ago  Allergies Stephanie [name], [place]; 07/22/2015 2:36 PM) Wellbutrin *ANTIDEPRESSANTS*  Medication  History Stephanie [name], [place]; 07/22/2015 2:36 PM) Estradiol (0.05MG [id number] Patch Weekly, Transdermal) Active. PROzac (20MG  Capsule, Oral) Active. Progesterone Micronized (100MG  Capsule, Oral) Active. Multiple Vitamin (1 (one) Oral) Active. Medications Reconciled  Social History Stephanie [name], [place]; 07/22/2015 2:36 PM) Alcohol use Occasional alcohol use. Caffeine use Carbonated beverages. No drug use Tobacco use Never smoker.  Family History Stephanie [name], [place]; 07/22/2015 2:36 PM) Heart Disease Brother, Father. Heart disease in female family member before age 31 Hypertension Mother.  Pregnancy / Birth History Stephanie [name], [place]; 07/22/2015 2:36 PM) Age at menarche 50 years. Age of menopause 47-50 Gravida 2 Maternal age 61-30 Para 2     Review of Systems (Stephanie [name] CMA; 07/22/2015 2:36 PM) General Not Present- Appetite Loss, Chills, Fatigue, Fever, Night Sweats, Weight Gain and Weight Loss. Skin Not Present- Change in Wart/Mole, Dryness, Hives, Jaundice, New Lesions, Non-Healing Wounds, Rash and Ulcer. HEENT Present- Wears glasses/contact lenses. Not Present- Earache, Hearing Loss, Hoarseness, Nose Bleed, Oral Ulcers, Ringing in the Ears, Seasonal Allergies, Sinus Pain, Sore Throat, Visual Disturbances and Yellow Eyes. Respiratory Not Present- Bloody sputum, Chronic Cough, Difficulty Breathing, Snoring and Wheezing. Breast Present- Breast Mass. Not Present- Breast Pain, Nipple Discharge and Skin Changes. Cardiovascular Not Present- Chest Pain, Difficulty Breathing Lying Down, Leg Cramps, Palpitations, Rapid Heart Rate, Shortness of Breath and Swelling of Extremities. Gastrointestinal Not Present- Abdominal Pain, Bloating, Bloody Stool, Change in Bowel Habits, Chronic diarrhea, Constipation, Difficulty Swallowing, Excessive gas, Gets full quickly  at meals, Hemorrhoids, Indigestion, Nausea, Rectal Pain and Vomiting. Female Genitourinary Not Present- Frequency, Nocturia, Painful Urination, Pelvic Pain and Urgency. Musculoskeletal Not Present- Back Pain, Joint Pain, Joint Stiffness, Muscle Pain, Muscle Weakness and Swelling of Extremities. Neurological Not Present- Decreased Memory, Fainting, Headaches, Numbness, Seizures, Tingling, Tremor, Trouble walking and Weakness. Psychiatric Not Present- Anxiety, Bipolar, Change in Sleep Pattern, Depression, Fearful and Frequent crying. Endocrine Not Present- Cold Intolerance, Excessive Hunger, Hair Changes, Heat Intolerance, Hot flashes and New Diabetes. Hematology Not Present- Easy Bruising, Excessive bleeding, Gland problems, HIV and Persistent Infections.  Vitals [employer] R. [name] CMA; 07/22/2015 2:36 PM) 07/22/2015 2:35 PM Weight: 141.5 lb Height: 63in Body Surface Area: 1.67 m Body Mass Index: 25.07 kg/m  BP: 120/74 (Sitting, Left Arm, Standard)      Physical Exam (Arianni Gallego A. Berk Pilot MD; 07/22/2015 3:08 PM)  General Mental Status-Alert. General Appearance-Consistent with stated age. Hydration-Well hydrated. Voice-Normal.  Head and Neck Head-normocephalic, atraumatic with no lesions or palpable masses. Trachea-midline. Thyroid Gland Characteristics - normal size and consistency.  Eye Eyeball - Bilateral-Extraocular movements intact. Sclera/Conjunctiva - Bilateral-No scleral icterus.  Chest and Lung Exam Chest and lung exam reveals -quiet, even and easy respiratory effort with no use of accessory muscles and on auscultation, normal breath sounds, no adventitious sounds and normal vocal resonance. Inspection Chest Wall - Normal. Back - normal.  Breast Breast - Left-Symmetric, Non Tender, No Biopsy scars, no Dimpling, No Inflammation, No Lumpectomy scars, No Mastectomy scars, No Peau d' Orange. Breast - Right-Symmetric, Non Tender, No Biopsy scars,  no Dimpling, No Inflammation, No Lumpectomy scars, No Mastectomy scars, No Peau d' Orange. Breast Lump-No Palpable Breast Mass.  Cardiovascular Cardiovascular examination reveals -normal heart sounds, regular rate and rhythm with no murmurs and normal pedal pulses bilaterally.  Abdomen Inspection Inspection of the abdomen reveals - No Hernias. Skin - Scar - no surgical scars. Palpation/Percussion Palpation and Percussion of the abdomen reveal - Soft, Non Tender, No Rebound tenderness, No Rigidity (guarding) and No hepatosplenomegaly.  Auscultation Auscultation of the abdomen reveals - Bowel sounds normal.  Neurologic Neurologic evaluation reveals -alert and oriented x 3 with no impairment of recent or remote memory. Mental Status-Normal.  Musculoskeletal Normal Exam - Left-Upper Extremity Strength Normal and Lower Extremity Strength Normal. Normal Exam - Right-Upper Extremity Strength Normal and Lower Extremity Strength Normal.  Lymphatic Head & Neck  General Head & Neck Lymphatics: Bilateral - Description - Normal. Axillary  General Axillary Region: Bilateral - Description - Normal. Tenderness - Non Tender. Femoral & Inguinal  Generalized Femoral & Inguinal Lymphatics: Bilateral - Description - Normal. Tenderness - Non Tender.    Assessment & Plan (Norbert Malkin A. Katty Fretwell MD; 07/22/2015 3:09 PM)  MASS OF LEFT BREAST ON MAMMOGRAM (N63) Impression: Discussed options of observation versus lumpectomy. Risks, benefits and alternatives discussed.    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 Pt agrees to proceed.  Current Plans Pt Education - [facility] Breast Biopsy HCI: discussed with patient and provided information. The anatomy and the  physiology was discussed. The pathophysiology and natural history of the disease was discussed. Options were discussed and recommendations were made. Technique, risks, benefits, & alternatives were discussed. Risks such as stroke, heart attack, bleeding, indection, death, and other risks discussed. Questions answered. The patient agr

## 2015-08-02 MED FILL — FLUoxetine HCL 20 MG CAPS: 20 | 30 days supply | Qty: 30 | Fill #1

## 2015-08-02 MED FILL — PROGESTERONE 100 MG CAPSULE: 100 | 30 days supply | Qty: 30 | Fill #5

## 2015-08-05 ENCOUNTER — Other Ambulatory Visit: Payer: Self-pay | Admitting: Surgery

## 2015-08-05 DIAGNOSIS — N6082 Other benign mammary dysplasias of left breast: Secondary | ICD-10-CM | POA: Diagnosis not present

## 2015-08-05 DIAGNOSIS — N6320 Unspecified lump in the left breast, unspecified quadrant: Secondary | ICD-10-CM

## 2015-08-06 ENCOUNTER — Encounter (HOSPITAL_BASED_OUTPATIENT_CLINIC_OR_DEPARTMENT_OTHER): Payer: Self-pay | Admitting: *Deleted

## 2015-08-13 ENCOUNTER — Ambulatory Visit
Admission: RE | Admit: 2015-08-13 | Discharge: 2015-08-13 | Disposition: A | Discharge status: Home / Self Care | Payer: 59 | Source: Ambulatory Visit | Attending: Surgery | Admitting: Surgery

## 2015-08-13 DIAGNOSIS — N6320 Unspecified lump in the left breast, unspecified quadrant: Secondary | ICD-10-CM

## 2015-08-13 DIAGNOSIS — N62 Hypertrophy of breast: Secondary | ICD-10-CM | POA: Diagnosis not present

## 2015-08-15 ENCOUNTER — Ambulatory Visit (HOSPITAL_BASED_OUTPATIENT_CLINIC_OR_DEPARTMENT_OTHER)
Admission: RE | Admit: 2015-08-15 | Discharge: 2015-08-15 | Disposition: A | Discharge status: Home / Self Care | Payer: 59 | Source: Ambulatory Visit | Attending: Surgery | Admitting: Surgery

## 2015-08-15 ENCOUNTER — Encounter (HOSPITAL_BASED_OUTPATIENT_CLINIC_OR_DEPARTMENT_OTHER): Payer: Self-pay | Admitting: *Deleted

## 2015-08-15 ENCOUNTER — Ambulatory Visit
Admission: RE | Admit: 2015-08-15 | Discharge: 2015-08-15 | Disposition: A | Discharge status: Home / Self Care | Payer: 59 | Source: Ambulatory Visit | Attending: Surgery | Admitting: Surgery

## 2015-08-15 ENCOUNTER — Ambulatory Visit (HOSPITAL_BASED_OUTPATIENT_CLINIC_OR_DEPARTMENT_OTHER): Payer: 59 | Admitting: Anesthesiology

## 2015-08-15 ENCOUNTER — Encounter (HOSPITAL_BASED_OUTPATIENT_CLINIC_OR_DEPARTMENT_OTHER)
Admission: RE | Disposition: A | Discharge status: Home / Self Care | Payer: Self-pay | Source: Ambulatory Visit | Attending: Surgery

## 2015-08-15 DIAGNOSIS — F329 Major depressive disorder, single episode, unspecified: Secondary | ICD-10-CM | POA: Diagnosis not present

## 2015-08-15 DIAGNOSIS — N6320 Unspecified lump in the left breast, unspecified quadrant: Secondary | ICD-10-CM

## 2015-08-15 DIAGNOSIS — Z888 Allergy status to other drugs, medicaments and biological substances status: Secondary | ICD-10-CM | POA: Insufficient documentation

## 2015-08-15 DIAGNOSIS — N6012 Diffuse cystic mastopathy of left breast: Secondary | ICD-10-CM | POA: Insufficient documentation

## 2015-08-15 DIAGNOSIS — Z79899 Other long term (current) drug therapy: Secondary | ICD-10-CM | POA: Diagnosis not present

## 2015-08-15 DIAGNOSIS — N63 Unspecified lump in breast: Secondary | ICD-10-CM | POA: Diagnosis not present

## 2015-08-15 DIAGNOSIS — N62 Hypertrophy of breast: Secondary | ICD-10-CM | POA: Diagnosis present

## 2015-08-15 DIAGNOSIS — F419 Anxiety disorder, unspecified: Secondary | ICD-10-CM | POA: Insufficient documentation

## 2015-08-15 DIAGNOSIS — R921 Mammographic calcification found on diagnostic imaging of breast: Secondary | ICD-10-CM | POA: Diagnosis not present

## 2015-08-15 HISTORY — PX: BREAST EXCISIONAL BIOPSY: SUR124

## 2015-08-15 HISTORY — DX: Major depressive disorder, single episode, unspecified: F32.9

## 2015-08-15 HISTORY — DX: Headache: R51

## 2015-08-15 HISTORY — DX: Depression, unspecified: F32.A

## 2015-08-15 HISTORY — DX: Anxiety disorder, unspecified: F41.9

## 2015-08-15 HISTORY — DX: Headache, unspecified: R51.9

## 2015-08-15 HISTORY — DX: Unspecified lump in the left breast, unspecified quadrant: N63.20

## 2015-08-15 HISTORY — PX: BREAST LUMPECTOMY WITH RADIOACTIVE SEED LOCALIZATION: SHX6424

## 2015-08-15 SURGERY — BREAST LUMPECTOMY WITH RADIOACTIVE SEED LOCALIZATION
Anesthesia: General | Site: Breast | Laterality: Left

## 2015-08-15 MED ORDER — PROPOFOL 10 MG/ML IV BOLUS
INTRAVENOUS | Status: DC | PRN
  Administered 2015-08-15: 100 mg via INTRAVENOUS

## 2015-08-15 MED ORDER — SCOPOLAMINE 1 MG/3DAYS TD PT72: 1.0000 | MEDICATED_PATCH | Freq: Once | TRANSDERMAL | Status: DC | PRN

## 2015-08-15 MED ORDER — LACTATED RINGERS IV SOLN: INTRAVENOUS | Status: DC

## 2015-08-15 MED ORDER — MIDAZOLAM HCL 2 MG/2ML IJ SOLN
INTRAMUSCULAR | Status: AC
  Filled 2015-08-15: qty 2

## 2015-08-15 MED ORDER — IBUPROFEN 200 MG PO TABS
ORAL_TABLET | ORAL | Status: AC
  Filled 2015-08-15: qty 2

## 2015-08-15 MED ORDER — IBUPROFEN 400 MG PO TABS
400.0000 mg | ORAL_TABLET | Freq: Once | ORAL | Status: AC
  Administered 2015-08-15: 400 mg via ORAL

## 2015-08-15 MED ORDER — OXYCODONE-ACETAMINOPHEN 5-325 MG PO TABS: 1.0000 | ORAL_TABLET | ORAL | Status: DC | PRN

## 2015-08-15 MED ORDER — FENTANYL CITRATE (PF) 100 MCG/2ML IJ SOLN
INTRAMUSCULAR | Status: AC
  Filled 2015-08-15: qty 2

## 2015-08-15 MED ORDER — DEXAMETHASONE SODIUM PHOSPHATE 4 MG/ML IJ SOLN
INTRAMUSCULAR | Status: DC | PRN
  Administered 2015-08-15: 10 mg via INTRAVENOUS

## 2015-08-15 MED ORDER — DEXTROSE 5 % IV SOLN
3.0000 g | INTRAVENOUS | Status: AC
  Administered 2015-08-15: 2 g via INTRAVENOUS

## 2015-08-15 MED ORDER — GLYCOPYRROLATE 0.2 MG/ML IJ SOLN: 0.2000 mg | Freq: Once | INTRAMUSCULAR | Status: DC | PRN

## 2015-08-15 MED ORDER — FENTANYL CITRATE (PF) 100 MCG/2ML IJ SOLN
50.0000 ug | INTRAMUSCULAR | Status: DC | PRN
  Administered 2015-08-15: 50 ug via INTRAVENOUS

## 2015-08-15 MED ORDER — CHLORHEXIDINE GLUCONATE 4 % EX LIQD: 1.0000 "application " | Freq: Once | CUTANEOUS | Status: DC

## 2015-08-15 MED ORDER — ONDANSETRON HCL 4 MG/2ML IJ SOLN
INTRAMUSCULAR | Status: AC
  Filled 2015-08-15: qty 2

## 2015-08-15 MED ORDER — BUPIVACAINE-EPINEPHRINE (PF) 0.25% -1:200000 IJ SOLN
INTRAMUSCULAR | Status: DC | PRN
  Administered 2015-08-15: 10 mL

## 2015-08-15 MED ORDER — ONDANSETRON HCL 4 MG/2ML IJ SOLN
INTRAMUSCULAR | Status: DC | PRN
  Administered 2015-08-15: 4 mg via INTRAVENOUS

## 2015-08-15 MED ORDER — LIDOCAINE HCL (CARDIAC) 20 MG/ML IV SOLN
INTRAVENOUS | Status: DC | PRN
  Administered 2015-08-15: 100 mg via INTRAVENOUS

## 2015-08-15 MED ORDER — CEFAZOLIN SODIUM-DEXTROSE 2-4 GM/100ML-% IV SOLN
INTRAVENOUS | Status: AC
  Filled 2015-08-15: qty 100

## 2015-08-15 MED ORDER — LIDOCAINE HCL (CARDIAC) 20 MG/ML IV SOLN
INTRAVENOUS | Status: AC
  Filled 2015-08-15: qty 5

## 2015-08-15 MED ORDER — FENTANYL CITRATE (PF) 100 MCG/2ML IJ SOLN
25.0000 ug | INTRAMUSCULAR | Status: DC | PRN
  Administered 2015-08-15: 25 ug via INTRAVENOUS

## 2015-08-15 MED ORDER — DEXAMETHASONE SODIUM PHOSPHATE 10 MG/ML IJ SOLN
INTRAMUSCULAR | Status: AC
  Filled 2015-08-15: qty 4

## 2015-08-15 MED ORDER — METOCLOPRAMIDE HCL 5 MG/ML IJ SOLN: 10.0000 mg | Freq: Once | INTRAMUSCULAR | Status: DC | PRN

## 2015-08-15 MED ORDER — MIDAZOLAM HCL 2 MG/2ML IJ SOLN
1.0000 mg | INTRAMUSCULAR | Status: DC | PRN
Start: 2015-08-15 — End: 2015-08-15
  Administered 2015-08-15: 2 mg via INTRAVENOUS

## 2015-08-15 MED ORDER — PROPOFOL 10 MG/ML IV BOLUS
INTRAVENOUS | Status: AC
  Filled 2015-08-15: qty 20

## 2015-08-15 MED ORDER — MEPERIDINE HCL 25 MG/ML IJ SOLN: 6.2500 mg | INTRAMUSCULAR | Status: DC | PRN

## 2015-08-15 MED ORDER — LACTATED RINGERS IV SOLN
INTRAVENOUS | Status: DC
  Administered 2015-08-15 (×2): via INTRAVENOUS

## 2015-08-15 MED FILL — OXYCODONE/APAP 5-325: 5-325 | 3 days supply | Qty: 30 | Fill #0

## 2015-08-15 SURGICAL SUPPLY — 35 items
BINDER BREAST LRG (GAUZE/BANDAGES/DRESSINGS) ×1 IMPLANT
BLADE SURG 15 STRL LF DISP TIS (BLADE) ×1 IMPLANT
BLADE SURG 15 STRL SS (BLADE) ×2
CHLORAPREP W/TINT 26ML (MISCELLANEOUS) ×2 IMPLANT
COVER BACK TABLE 60X90IN (DRAPES) ×2 IMPLANT
COVER MAYO STAND STRL (DRAPES) ×2 IMPLANT
COVER PROBE W GEL 5X96 (DRAPES) ×2 IMPLANT
DEVICE DUBIN W/COMP PLATE 8390 (MISCELLANEOUS) ×2 IMPLANT
DRAPE LAPAROTOMY 100X72 PEDS (DRAPES) ×2 IMPLANT
DRAPE UTILITY XL STRL (DRAPES) ×2 IMPLANT
ELECT COATED BLADE 2.86 ST (ELECTRODE) ×2 IMPLANT
ELECT REM PT RETURN 9FT ADLT (ELECTROSURGICAL) ×2
ELECTRODE REM PT RTRN 9FT ADLT (ELECTROSURGICAL) ×1 IMPLANT
GLOVE BIOGEL PI IND STRL 7.0 (GLOVE) IMPLANT
GLOVE BIOGEL PI IND STRL 8 (GLOVE) ×1 IMPLANT
GLOVE BIOGEL PI INDICATOR 7.0 (GLOVE) ×2
GLOVE BIOGEL PI INDICATOR 8 (GLOVE) ×1
GLOVE ECLIPSE 6.5 STRL STRAW (GLOVE) ×1 IMPLANT
GLOVE ECLIPSE 8.0 STRL XLNG CF (GLOVE) ×2 IMPLANT
GOWN STRL REUS W/ TWL LRG LVL3 (GOWN DISPOSABLE) ×2 IMPLANT
GOWN STRL REUS W/TWL LRG LVL3 (GOWN DISPOSABLE) ×4
KIT MARKER MARGIN INK (KITS) ×2 IMPLANT
LIQUID BAND (GAUZE/BANDAGES/DRESSINGS) ×2 IMPLANT
NDL HYPO 25X1 1.5 SAFETY (NEEDLE) ×1 IMPLANT
NEEDLE HYPO 25X1 1.5 SAFETY (NEEDLE) ×2 IMPLANT
NS IRRIG 1000ML POUR BTL (IV SOLUTION) ×2 IMPLANT
PACK BASIN DAY SURGERY FS (CUSTOM PROCEDURE TRAY) ×2 IMPLANT
PENCIL BUTTON HOLSTER BLD 10FT (ELECTRODE) ×2 IMPLANT
SLEEVE SCD COMPRESS KNEE MED (MISCELLANEOUS) ×2 IMPLANT
SPONGE LAP 4X18 X RAY DECT (DISPOSABLE) ×2 IMPLANT
SUT MNCRL AB 4-0 PS2 18 (SUTURE) ×2 IMPLANT
SUT VICRYL 3-0 CR8 SH (SUTURE) ×2 IMPLANT
SYR CONTROL 10ML LL (SYRINGE) ×2 IMPLANT
TOWEL OR 17X24 6PK STRL BLUE (TOWEL DISPOSABLE) ×2 IMPLANT
TOWEL OR NON WOVEN STRL DISP B (DISPOSABLE) ×2 IMPLANT

## 2015-08-15 NOTE — Anesthesia Postprocedure Evaluation (Signed)
Anesthesia Post Note  Patient: Stephanie [name]  Procedure(s) Performed: Procedure(s) (LRB):  LEFT BREAST LUMPECTOMY WITH RADIOACTIVE SEED LOCALIZATION (Left)  Patient location during evaluation: PACU Anesthesia Type: General Level of consciousness: awake and alert Pain management: pain level controlled Vital Signs Assessment: post-procedure vital signs reviewed and stable Respiratory status: spontaneous breathing, nonlabored ventilation, respiratory function stable and patient connected to nasal cannula oxygen Cardiovascular status: blood pressure returned to baseline and stable Postop Assessment: no signs of nausea or vomiting Anesthetic complications: no    Last Vitals:  Filed Vitals:   08/15/15 1457 08/15/15 1500  BP:  117/74  Pulse: 74 73  Temp:    Resp: 16 16    Last Pain:  Filed Vitals:   08/15/15 1506  PainSc: 4                  Montez Hageman

## 2015-08-15 NOTE — Discharge Instructions (Signed)
[facility] °Office Phone Number [phone] ° °BREAST BIOPSY/ PARTIAL MASTECTOMY: POST OP INSTRUCTIONS ° °Always review your discharge instruction sheet given to you by the facility where your surgery was performed. ° °IF YOU HAVE DISABILITY OR FAMILY LEAVE FORMS, YOU MUST BRING THEM TO THE OFFICE FOR PROCESSING.  DO NOT GIVE THEM TO YOUR DOCTOR. ° °1. A prescription for pain medication may be given to you upon discharge.  Take your pain medication as prescribed, if needed.  If narcotic pain medicine is not needed, then you may take acetaminophen (Tylenol) or ibuprofen (Advil) as needed. °2. Take your usually prescribed medications unless otherwise directed °3. If you need a refill on your pain medication, please contact your pharmacy.  They will contact our office to request authorization.  Prescriptions will not be filled after 5pm or on week-ends. °4. You should eat very light the first 24 hours after surgery, such as soup, crackers, pudding, etc.  Resume your normal diet the day after surgery. °5. Most patients will experience some swelling and bruising in the breast.  Ice packs and a good support bra will help.  Swelling and bruising can take several days to resolve.  °6. It is common to experience some constipation if taking pain medication after surgery.  Increasing fluid intake and taking a stool softener will usually help or prevent this problem from occurring.  A mild laxative (Milk of Magnesia or Miralax) should be taken according to package directions if there are no bowel movements after 48 hours. °7. Unless discharge instructions indicate otherwise, you may remove your bandages 24-48 hours after surgery, and you may shower at that time.  You may have steri-strips (small skin tapes) in place directly over the incision.  These strips should be left on the skin for 7-10 days.  If your surgeon used skin glue on the incision, you may shower in 24 hours.  The glue will flake off over the  next 2-3 weeks.  Any sutures or staples will be removed at the office during your follow-up visit. °8. ACTIVITIES:  You may resume regular daily activities (gradually increasing) beginning the next day.  Wearing a good support bra or sports bra minimizes pain and swelling.  You may have sexual intercourse when it is comfortable. °a. You may drive when you no longer are taking prescription pain medication, you can comfortably wear a seatbelt, and you can safely maneuver your car and apply brakes. °b. RETURN TO WORK:  ______________________________________________________________________________________ °9. You should see your doctor in the office for a follow-up appointment approximately two weeks after your surgery.  Your doctor’s nurse will typically make your follow-up appointment when she calls you with your pathology report.  Expect your pathology report 2-3 business days after your surgery.  You may call to check if you do not hear from us after three days. °10. OTHER INSTRUCTIONS: _______________________________________________________________________________________________ _____________________________________________________________________________________________________________________________________ °_____________________________________________________________________________________________________________________________________ °_____________________________________________________________________________________________________________________________________ ° °WHEN TO CALL YOUR DOCTOR: °1. Fever over 101.0 °2. Nausea and/or vomiting. °3. Extreme swelling or bruising. °4. Continued bleeding from incision. °5. Increased pain, redness, or drainage from the incision. ° °The clinic staff is available to answer your questions during regular business hours.  Please don’t hesitate to call and ask to speak to one of the nurses for clinical concerns.  If you have a medical emergency, go to the nearest  emergency room or call 911.  A surgeon from [facility] is always on call at the hospital. ° °For further questions, please visit [email or web address]  ° ° ° °  Post Anesthesia Home Care Instructions ° °Activity: °Get plenty of rest for the remainder of the day. A responsible adult should stay with you for 24 hours following the procedure.  °For the next 24 hours, DO NOT: °-Drive a car °-Operate machinery °-Drink alcoholic beverages °-Take any medication unless instructed by your physician °-Make any legal decisions or sign important papers. ° °Meals: °Start with liquid foods such as gelatin or soup. Progress to regular foods as tolerated. Avoid greasy, spicy, heavy foods. If nausea and/or vomiting occur, drink only clear liquids until the nausea and/or vomiting subsides. Call your physician if vomiting continues. ° °Special Instructions/Symptoms: °Your throat may feel dry or sore from the anesthesia or the breathing tube placed in your throat during surgery. If this causes discomfort, gargle with warm salt water. The discomfort should disappear within 24 hours. ° °If you had a scopolamine patch placed behind your ear for the management of post- operative nausea and/or vomiting: ° °1. The medication in the patch is effective for 72 hours, after which it should be removed.  Wrap patch in a tissue and discard in the trash. Wash hands thoroughly with soap and water. °2. You may remove the patch earlier than 72 hours if you experience unpleasant side effects which may include dry mouth, dizziness or visual disturbances. °3. Avoid touching the patch. Wash your hands with soap and water after contact with the patch. °  ° °

## 2015-08-15 NOTE — Transfer of Care (Signed)
Immediate Anesthesia Transfer of Care Note  Patient: Stephanie [name]  Procedure(s) Performed: Procedure(s):  LEFT BREAST LUMPECTOMY WITH RADIOACTIVE SEED LOCALIZATION (Left)  Patient Location: PACU  Anesthesia Type:General  Level of Consciousness: awake, sedated and patient cooperative  Airway & Oxygen Therapy: Patient Spontanous Breathing and Patient connected to face mask oxygen  Post-op Assessment: Report given to RN and Post -op Vital signs reviewed and stable  Post vital signs: Reviewed and stable  Last Vitals:  Filed Vitals:   08/15/15 1251  BP: 123/75  Pulse: 79  Temp: 37 C  Resp: 20    Complications: No apparent anesthesia complications

## 2015-08-15 NOTE — Op Note (Signed)
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  Anesthesia: Gen. With 0.25% Sensorcaine local with epinephrine   EBL: 20 cc  Specimen: Left breast tissue with clip and radioactive seed in the specimen. Verified with neoprobe and radiographic image showing both seed and clip in specimen  Indications for procedure: The patient presents for left breast seed localized lumpectomy for  Atypical apocrine hyperplasia. . Discussed the rationale for considering excision. Small risk of malignancy associated with  This lesion after core biopsy. Discussed observation. Discussed wire localization / seed use. Patient desired excision of left breast AAH.The procedure has been discussed with the patient. Alternatives to surgery have been discussed with the patient.  Risks of surgery include bleeding,  Infection,  Seroma formation, death,  and the need for further surgery.   The patient understands and wishes to proceed.   Description of procedure: Patient underwent seed placement as an outpatient. Patient presents today for left breast seed localized lumpectomy. Patient SEEN IN THE  holding area. Questions are answered and neoprobe used to verify seed location. Patient taken back to the operating room and placed  Supine upon the OR table. After induction of general anesthesia, left breast prepped and draped in a sterile fashion. Timeout was done to verify proper  procedure. Neoprobe used and hot spot identified and left breast upper-outer quadrant. This was marked with pen. Curvilinear incision made left upper outer quadrant breast along border of NAC. Dissection used with the help of a neoprobe around the tissue where the seed and clip were located. Tissue removed in its entirety with gross NEGATIVE  margins.. Neoprobe used and seed within specimen. Radiographs taken which show clip and  seed  In specimen. Hemostasis achieved and cavity closed with 3-0 Vicryl and 4-0 Monocryl. Liquid adhesive applied. All final counts found to be correct. Specimen transported to pathology. Patient awoke extubated taken to recovery in satisfactory condition.

## 2015-08-15 NOTE — H&P (View-Only) (Signed)
Stephanie [name] 07/22/2015 2:35 PM Location: [facility] Patient #: [id number] DOB: 1966-03-04 Undefined / Language: Stephanie [name] / Race: White Female  History of Present Illness Stephanie Moores A. Munachimso Rigdon MD; 07/22/2015 3:08 PM) Patient words: left breast mass   Patient sent at the request of Dr. Brigitte [name] for abnormal mammogram. She was found to have a cluster of left breast microcalcifications and changes that have been following for a number of years. 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 There is also a background of fibrocystic change. She was sent at the request of Dr. Blenda [name] consultation for abnormal mammogram and atypical apical hyperplasia. She has a history of fibrocystic change but her symptoms have improved in menopause. She has sore area in her left breast. No family history of breast cancer          Breast, left, needle core biopsy, upper, outer quadrant - ATYPICAL APOCRINE HYPERPLASIA. - FIBROCYSTIC CHANGES WITH ADENOSIS AND CALCIFICATIONS. - SEE COMMENT.      ADDENDUM REPORT: 07/11/2015 14:24 ADDENDUM: Pathology revealed FIBROCYSTIC CHANGES WITH ADENOSIS AND CALCIFICATIONS AND ATYPICAL APOCRINE HYPERPLASIA. Excision is recommended. This was found to be concordant by Dr. Pamelia [name]. Pathology results were discussed with the patient by telephone. The patient reported tenderness and is doing well after the biopsy. Post biopsy instructions and care were reviewed and questions were answered. The patient was encouraged to call [facility] for any additional concerns. Surgical consultation has been arranged with Dr. Erroll [name] at [facility] on July 22, 2015. Pathology results reported by Stephanie Purser, [name] on 07/11/2015. Electronically Signed By: Stephanie [name] M.D.               CLINICAL DATA: Patient presents for evaluation of focal  tenderness within the medial left breast. EXAM: DIGITAL DIAGNOSTIC BILATERAL MAMMOGRAM WITH 3D TOMOSYNTHESIS WITH CAD ULTRASOUND LEFT BREAST COMPARISON: Previous exam(s). ACR Breast Density Category b: There are scattered areas of fibroglandular density. FINDINGS: CC and MLO tomosynthesis images of the right left breast as well as magnification CC and true lateral images of the left breast were obtained. Within the upper-outer left breast there is a 7.5 cm area of linearly oriented punctate calcifications, further evaluated with magnification views. No suspicious abnormalities identified within the right breast. Mammographic images were processed with CAD. On physical exam, I palpate no discrete mass within the medial left breast. Targeted ultrasound is performed, showing normal dense tissue within the medial left breast without suspicious mass. IMPRESSION: Suspicious calcifications upper-outer left breast. No suspicious abnormality identified within the medial left breast to correspond with patient's area of focal tenderness. RECOMMENDATION: Stereotactic guided core needle biopsy left breast calcifications. Biopsy has been scheduled for 07/10/2014. I have discussed the findings and recommendations with the patient. Results were also provided in writing at the conclusion of the visit. If applicable, a reminder letter will be sent to the patient regarding the next appointment. BI-RADS CATEGORY 4: Suspicious. Electronically Signed By: Stephanie Newcomer M.D. On: 07/04/2015 09:53 .  The patient is a 50 year old female.   Other Problems Stephanie [name], [place]; 07/22/2015 2:36 PM) Anxiety Disorder Depression Migraine Headache  Past Surgical History Stephanie [name], [name]; 07/22/2015 2:36 PM) Breast Biopsy Left. Tonsillectomy  Diagnostic Studies History Stephanie [name], [place]; 07/22/2015 2:36 PM) Colonoscopy never Mammogram within last year Pap Smear 1-5 years  ago  Allergies Stephanie [name], [place]; 07/22/2015 2:36 PM) Wellbutrin *ANTIDEPRESSANTS*  Medication  History Stephanie [name], [place]; 07/22/2015 2:36 PM) Estradiol (0.05MG [id number] Patch Weekly, Transdermal) Active. PROzac (20MG  Capsule, Oral) Active. Progesterone Micronized (100MG  Capsule, Oral) Active. Multiple Vitamin (1 (one) Oral) Active. Medications Reconciled  Social History Stephanie [name], [place]; 07/22/2015 2:36 PM) Alcohol use Occasional alcohol use. Caffeine use Carbonated beverages. No drug use Tobacco use Never smoker.  Family History Stephanie [name], [place]; 07/22/2015 2:36 PM) Heart Disease Brother, Father. Heart disease in female family member before age 36 Hypertension Mother.  Pregnancy / Birth History Stephanie [name], [place]; 07/22/2015 2:36 PM) Age at menarche 78 years. Age of menopause 76-50 Gravida 2 Maternal age 37-30 Para 2     Review of Systems (Stephanie [name]; 07/22/2015 2:36 PM) General Not Present- Appetite Loss, Chills, Fatigue, Fever, Night Sweats, Weight Gain and Weight Loss. Skin Not Present- Change in Wart/Mole, Dryness, Hives, Jaundice, New Lesions, Non-Healing Wounds, Rash and Ulcer. HEENT Present- Wears glasses/contact lenses. Not Present- Earache, Hearing Loss, Hoarseness, Nose Bleed, Oral Ulcers, Ringing in the Ears, Seasonal Allergies, Sinus Pain, Sore Throat, Visual Disturbances and Yellow Eyes. Respiratory Not Present- Bloody sputum, Chronic Cough, Difficulty Breathing, Snoring and Wheezing. Breast Present- Breast Mass. Not Present- Breast Pain, Nipple Discharge and Skin Changes. Cardiovascular Not Present- Chest Pain, Difficulty Breathing Lying Down, Leg Cramps, Palpitations, Rapid Heart Rate, Shortness of Breath and Swelling of Extremities. Gastrointestinal Not Present- Abdominal Pain, Bloating, Bloody Stool, Change in Bowel Habits, Chronic diarrhea, Constipation, Difficulty Swallowing, Excessive gas, Gets full quickly  at meals, Hemorrhoids, Indigestion, Nausea, Rectal Pain and Vomiting. Female Genitourinary Not Present- Frequency, Nocturia, Painful Urination, Pelvic Pain and Urgency. Musculoskeletal Not Present- Back Pain, Joint Pain, Joint Stiffness, Muscle Pain, Muscle Weakness and Swelling of Extremities. Neurological Not Present- Decreased Memory, Fainting, Headaches, Numbness, Seizures, Tingling, Tremor, Trouble walking and Weakness. Psychiatric Not Present- Anxiety, Bipolar, Change in Sleep Pattern, Depression, Fearful and Frequent crying. Endocrine Not Present- Cold Intolerance, Excessive Hunger, Hair Changes, Heat Intolerance, Hot flashes and New Diabetes. Hematology Not Present- Easy Bruising, Excessive bleeding, Gland problems, HIV and Persistent Infections.  Vitals [employer] R. Brooks [name]; 07/22/2015 2:36 PM) 07/22/2015 2:35 PM Weight: 141.5 lb Height: 63in Body Surface Area: 1.67 m Body Mass Index: 25.07 kg/m  BP: 120/74 (Sitting, Left Arm, Standard)      Physical Exam (Deran Barro A. Beadie Matsunaga MD; 07/22/2015 3:08 PM)  General Mental Status-Alert. General Appearance-Consistent with stated age. Hydration-Well hydrated. Voice-Normal.  Head and Neck Head-normocephalic, atraumatic with no lesions or palpable masses. Trachea-midline. Thyroid Gland Characteristics - normal size and consistency.  Eye Eyeball - Bilateral-Extraocular movements intact. Sclera/Conjunctiva - Bilateral-No scleral icterus.  Chest and Lung Exam Chest and lung exam reveals -quiet, even and easy respiratory effort with no use of accessory muscles and on auscultation, normal breath sounds, no adventitious sounds and normal vocal resonance. Inspection Chest Wall - Normal. Back - normal.  Breast Breast - Left-Symmetric, Non Tender, No Biopsy scars, no Dimpling, No Inflammation, No Lumpectomy scars, No Mastectomy scars, No Peau d' Orange. Breast - Right-Symmetric, Non Tender, No Biopsy scars,  no Dimpling, No Inflammation, No Lumpectomy scars, No Mastectomy scars, No Peau d' Orange. Breast Lump-No Palpable Breast Mass.  Cardiovascular Cardiovascular examination reveals -normal heart sounds, regular rate and rhythm with no murmurs and normal pedal pulses bilaterally.  Abdomen Inspection Inspection of the abdomen reveals - No Hernias. Skin - Scar - no surgical scars. Palpation/Percussion Palpation and Percussion of the abdomen reveal - Soft, Non Tender, No Rebound tenderness, No Rigidity (guarding) and No hepatosplenomegaly.  Auscultation Auscultation of the abdomen reveals - Bowel sounds normal.  Neurologic Neurologic evaluation reveals -alert and oriented x 3 with no impairment of recent or remote memory. Mental Status-Normal.  Musculoskeletal Normal Exam - Left-Upper Extremity Strength Normal and Lower Extremity Strength Normal. Normal Exam - Right-Upper Extremity Strength Normal and Lower Extremity Strength Normal.  Lymphatic Head & Neck  General Head & Neck Lymphatics: Bilateral - Description - Normal. Axillary  General Axillary Region: Bilateral - Description - Normal. Tenderness - Non Tender. Femoral & Inguinal  Generalized Femoral & Inguinal Lymphatics: Bilateral - Description - Normal. Tenderness - Non Tender.    Assessment & Plan (Shabrea Weldin A. Delonta Yohannes MD; 07/22/2015 3:09 PM)  MASS OF LEFT BREAST ON MAMMOGRAM (N63) Impression: Discussed options of observation versus lumpectomy. Risks, benefits and alternatives discussed.    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 Pt agrees to proceed.  Current Plans Pt Education - [facility] Breast Biopsy HCI: discussed with patient and provided information. The anatomy and the  physiology was discussed. The pathophysiology and natural history of the disease was discussed. Options were discussed and recommendations were made. Technique, risks, benefits, & alternatives were discussed. Risks such as stroke, heart attack, bleeding, indection, death, and other risks discussed. Questions answered. The patient agr

## 2015-08-15 NOTE — Anesthesia Preprocedure Evaluation (Addendum)
Anesthesia Evaluation  Patient identified by MRN, date of birth, ID band Patient awake    Reviewed: Allergy & Precautions, NPO status , Patient's Chart, lab work & pertinent test results  Airway Mallampati: II  TM Distance: >3 FB Neck ROM: Full    Dental no notable dental hx.    Pulmonary neg pulmonary ROS,    Pulmonary exam normal breath sounds clear to auscultation       Cardiovascular negative cardio ROS Normal cardiovascular exam Rhythm:Regular Rate:Normal     Neuro/Psych negative neurological ROS  negative psych ROS   GI/Hepatic negative GI ROS, Neg liver ROS,   Endo/Other  negative endocrine ROS  Renal/GU negative Renal ROS  negative genitourinary   Musculoskeletal negative musculoskeletal ROS (+)   Abdominal   Peds negative pediatric ROS (+)  Hematology negative hematology ROS (+)   Anesthesia Other Findings   Reproductive/Obstetrics negative OB ROS                             Anesthesia Physical Anesthesia Plan  ASA: II  Anesthesia Plan: General   Post-op Pain Management:    Induction: Intravenous  Airway Management Planned: LMA  Additional Equipment:   Intra-op Plan:   Post-operative Plan: Extubation in OR  Informed Consent: I have reviewed the patients History and Physical, chart, labs and discussed the procedure including the risks, benefits and alternatives for the proposed anesthesia with the patient or authorized representative who has indicated his/her understanding and acceptance.   Dental advisory given  Plan Discussed with: CRNA  Anesthesia Plan Comments:         Anesthesia Quick Evaluation  

## 2015-08-15 NOTE — Anesthesia Procedure Notes (Signed)
Procedure Name: LMA Insertion Date/Time: 08/15/2015 1:29 PM Performed by: Lyndee Leo Pre-anesthesia Checklist: Patient identified, Emergency Drugs available, Suction available and Patient being monitored Patient Re-evaluated:Patient Re-evaluated prior to inductionOxygen Delivery Method: Circle System Utilized Preoxygenation: Pre-oxygenation with 100% oxygen Intubation Type: IV induction Ventilation: Mask ventilation without difficulty LMA: LMA inserted LMA Size: 3.0 Number of attempts: 1 Airway Equipment and Method: Bite block Placement Confirmation: positive ETCO2 Tube secured with: Tape Dental Injury: Teeth and Oropharynx as per pre-operative assessment

## 2015-08-15 NOTE — Interval H&P Note (Signed)
History and Physical Interval Note:  08/15/2015 1:04 PM  Stephanie [name]  has presented today for surgery, with the diagnosis of left breast mass  The various methods of treatment have been discussed with the patient and family. After consideration of risks, benefits and other options for treatment, the patient has consented to  Procedure(s):  LEFT BREAST LUMPECTOMY WITH RADIOACTIVE SEED LOCALIZATION (Left) as a surgical intervention .  The patient's history has been reviewed, patient examined, no change in status, stable for surgery.  I have reviewed the patient's chart and labs.  Questions were answered to the patient's satisfaction.     Darielle Hancher A.

## 2015-08-16 ENCOUNTER — Encounter (HOSPITAL_BASED_OUTPATIENT_CLINIC_OR_DEPARTMENT_OTHER): Payer: Self-pay | Admitting: Surgery

## 2015-08-19 ENCOUNTER — Telehealth: Payer: Self-pay | Admitting: Surgery

## 2015-08-19 MED FILL — ONDANSETRON ODT 4 MG TABLET: 4 | 3 days supply | Qty: 15 | Fill #0

## 2015-08-19 NOTE — Telephone Encounter (Signed)
Stephanie [name] had a left breast lumpectomy on 08/15/2015 by Dr. Brantley Stage.  Over the last 24 hours, she has felt achy and more poorly.  She has bruising of her left breast, but no redness.  She does not have a fever.  She wondered whether she had a wound infection, but I don't think so.  She will check with our office today for follow up with Dr. Brantley Stage.  Alphonsa Overall, MD, [facility] Pager: [phone] Office phone:  [phone]

## 2015-08-23 MED FILL — MINIVELLE 0.05 MG PATCH: 0.05 | 28 days supply | Qty: 8 | Fill #1

## 2015-09-06 MED FILL — PROGESTERONE 100 MG CAPSULE: 100 | 30 days supply | Qty: 30 | Fill #6

## 2015-09-06 MED FILL — FLUoxetine HCL 20 MG CAPS: 20 | 30 days supply | Qty: 30 | Fill #2

## 2015-09-26 ENCOUNTER — Encounter (HOSPITAL_COMMUNITY): Payer: Self-pay | Admitting: Psychiatry

## 2015-09-26 ENCOUNTER — Ambulatory Visit (INDEPENDENT_AMBULATORY_CARE_PROVIDER_SITE_OTHER): Payer: 59 | Admitting: Psychiatry

## 2015-09-26 VITALS — BP 116/80 | HR 73 | Ht 63.0 in | Wt 145.4 lb

## 2015-09-26 DIAGNOSIS — F332 Major depressive disorder, recurrent severe without psychotic features: Secondary | ICD-10-CM | POA: Diagnosis not present

## 2015-09-26 DIAGNOSIS — F900 Attention-deficit hyperactivity disorder, predominantly inattentive type: Secondary | ICD-10-CM

## 2015-09-26 DIAGNOSIS — F411 Generalized anxiety disorder: Secondary | ICD-10-CM

## 2015-09-26 MED ORDER — FLUOXETINE HCL 10 MG PO CAPS: 30.0000 mg | ORAL_CAPSULE | Freq: Every day | ORAL | Status: DC

## 2015-09-26 MED FILL — FLUoxetine HCL 10 MG CAPS: 10 | 30 days supply | Qty: 90 | Fill #0

## 2015-09-26 MED FILL — ESTRADIOL 0.05 MG PATCH: 0.05 | 28 days supply | Qty: 8 | Fill #0

## 2015-09-26 NOTE — Progress Notes (Signed)
[facility] Progress Note  Stephanie [name] VH:[id number] 50 y.o.  09/26/2015 3:30 PM  Chief Complaint:  I have been more anxious.  Recently I have surgery.      History of Present Illness: Stephanie [name] came for her followup appointment.  She is complaining of anxiety more than usual.  Recently she had surgery for breast cancer and had lumpectomy.  Patient is a relief everything went well but she still feels sometimes anxious and nervous.  She is taking Prozac 20 mg daily.  She has no side effects including any tremors shakes or any EPS.  Her sleep is good.  She lives with her 1 year old son who has ADHD and as per intake medicine.  Patient denies any agitation, anger, mood swing however she is wondering if the dose of Prozac can be further increase to help her anxiety.  Patient used to take Vyvanse to help her focus and attention but she stopped last December.  She's also not happy with her weight and trying to lose weight.  Patient denies drinking alcohol or using any illegal substances.  She lives with her husband and 13 year old son.  Her appetite is okay.  Her vitals are stable.  Suicidal Ideation: No Plan Formed: No Patient has means to carry out plan: No  Homicidal Ideation: No Plan Formed: No Patient has means to carry out plan: No  Review of Systems  Constitutional: Negative.   Musculoskeletal: Negative.   Skin: Negative.   Neurological: Negative for dizziness and tremors.  Psychiatric/Behavioral: The patient is nervous/anxious.    Psychiatric: Agitation: No Hallucination: No Depressed Mood: No Insomnia: No Hypersomnia: No Altered Concentration: No Feels Worthless: No Grandiose Ideas: No Belief In Special Powers: No New/Increased Substance Abuse: No Compulsions: No  Neurologic: Headache: No Seizure: No Paresthesias: No  Past Medical History:  Past Medical History   Diagnosis  Date   .  Osteopenia    Her primary care physician is Dr. Consuela Mimes.  Outpatient Encounter Prescriptions as of 09/26/2015  Medication Sig  . ADRENAL CORTEX PO Take by mouth.  . Calcium Citrate-Vitamin D (CALCIUM CITRATE + D PO) Take by mouth.  . estradiol (VIVELLE-DOT) 0.05 MG/24HR patch   . FLUoxetine (PROZAC) 10 MG capsule Take 3 capsules (30 mg total) by mouth daily.  . Multiple Minerals-Vitamins (CAL-MAG-ZINC-D) TABS Take by mouth.  . progesterone (PROMETRIUM) 100 MG capsule   . [DISCONTINUED] FLUoxetine (PROZAC) 20 MG capsule Take 1 capsule (20 mg total) by mouth daily.  . [DISCONTINUED] oxyCODONE-acetaminophen (ROXICET) 5-325 MG tablet Take 1-2 tablets by mouth every 4 (four) hours as needed.   No facility-administered encounter medications on file as of 09/26/2015.   Past Psychiatric History/Hospitalization(s):  She had tried Adderall and Vyvanse which causes shakes.  She tried Wellbutrin which actually worked very well but she developed hives and rash.  Patient denies any history of suicidal attempt, psychosis, hallucination or any paranoia. Anxiety: Yes  Bipolar Disorder: No  Depression: No   Mania: No  Psychosis: No  Schizophrenia: No  Personality Disorder: No  Hospitalization for psychiatric illness: No  History of Electroconvulsive Shock Therapy: No  Prior Suicide Attempts: No  Physical Exam: Constitutional:  BP 116/80 mmHg  Pulse 73  Ht 5\' 3"  (1.6 m)  Wt 145 lb 6.4 oz (65.953 kg)  BMI 25.76 kg/m2  LMP 01/25/2014 (Approximate)  General Appearance: well nourished  Musculoskeletal: Strength & Muscle Tone: within normal limits Gait & Station: normal Patient leans: N/A  Mental status  examination Patient is casually dressed and fairly groomed.  She is pleasant and cooperative.  She described her mood Euthymic and her affect is appropriate.  Her attention and concentration is good.  She denies any active or passive suicidal thoughts or homicidal thought. She denies any paranoia, hallucination or any obsessive thoughts.  She has  no delusions.  Her psychomotor activity is normal.  Her fund of knowledge is good.  There were no flight of ideas or any loose association.  She's alert and oriented 3.  Her cognition is good.  Her insight judgment and impulse control is okay.  Established Problem, Stable/Improving (1), Review of Psycho-Social Stressors (1), Review or order clinical lab tests (1), Review and summation of old records (2), Established Problem, Worsening (2), Review of Last Therapy Session (1), Review of Medication Regimen & Side Effects (2) and Review of New Medication or Change in Dosage (2)  Assessment: Axis I: ADHD, inattentive type; major depressive disorder, recurrent   Axis II: deferred  Axis III: osteopenia  Plan:  I reviewed records and blood work results from other providers.  Patient recently had lumpectomy and she admitted her anxiety has been increased.  I would increase Prozac 30 mg daily .  Discussed medication side effects and benefits.  Recommended to call us back if she feels worsening of the symptoms or having side effects.  Encourage to watch her calorie intake and to regular exercise.  Follow-up in 3 months. Discuss safety concern that anytime having suicidal thoughts or homicidal thoughts and she need to call 911 or go to the local emergency room.    Darria Corvera T., MD 09/26/2015

## 2015-10-08 MED FILL — FLUoxetine HCL 20 MG CAPS: 20 | 30 days supply | Qty: 30 | Fill #2

## 2015-10-08 MED FILL — PROGESTERONE 100 MG CAPSULE: 100 | 30 days supply | Qty: 30 | Fill #7

## 2015-11-04 MED FILL — ESTRADIOL 0.05 MG PATCH: 0.05 | 28 days supply | Qty: 8 | Fill #1

## 2015-11-15 ENCOUNTER — Other Ambulatory Visit (HOSPITAL_COMMUNITY): Payer: Self-pay | Admitting: Psychiatry

## 2015-11-15 MED FILL — PROGESTERONE 100 MG CAPSULE: 100 | 30 days supply | Qty: 30 | Fill #8

## 2015-11-18 ENCOUNTER — Other Ambulatory Visit (HOSPITAL_COMMUNITY): Payer: Self-pay

## 2015-11-18 DIAGNOSIS — F411 Generalized anxiety disorder: Secondary | ICD-10-CM

## 2015-11-18 MED ORDER — FLUOXETINE HCL 10 MG PO CAPS: 30.0000 mg | ORAL_CAPSULE | Freq: Every day | ORAL | 0 refills | Status: DC

## 2015-11-18 MED FILL — FLUoxetine HCL 10 MG CAPS: 10 | 30 days supply | Qty: 90 | Fill #0

## 2015-12-09 MED FILL — ESTRADIOL 0.05 MG PATCH: 0.05 | 28 days supply | Qty: 8 | Fill #2

## 2015-12-20 MED FILL — PROGESTERONE 100 MG CAPSULE: 100 | 30 days supply | Qty: 30 | Fill #9

## 2015-12-31 ENCOUNTER — Ambulatory Visit (HOSPITAL_COMMUNITY): Payer: Self-pay | Admitting: Psychiatry

## 2016-01-17 MED FILL — ESTRADIOL 0.05 MG PATCH: 0.05 | 28 days supply | Qty: 8 | Fill #3

## 2016-01-31 MED FILL — PROGESTERONE 100 MG CAPSULE: 100 | 30 days supply | Qty: 30 | Fill #10

## 2016-01-31 MED FILL — FLUoxetine HCL 10 MG CAPS: 10 | 30 days supply | Qty: 90 | Fill #1

## 2016-02-28 MED FILL — ESTRADIOL 0.05 MG PATCH: 0.05 | 28 days supply | Qty: 8 | Fill #4

## 2016-03-18 MED FILL — PROGESTERONE 100 MG CAPSULE: 100 | 30 days supply | Qty: 30 | Fill #0

## 2016-04-06 MED FILL — FLUoxetine HCL 10 MG CAPS: 10 | 30 days supply | Qty: 90 | Fill #2

## 2016-06-10 ENCOUNTER — Other Ambulatory Visit (HOSPITAL_COMMUNITY): Payer: Self-pay | Admitting: Psychiatry

## 2016-06-10 DIAGNOSIS — F411 Generalized anxiety disorder: Secondary | ICD-10-CM

## 2016-06-12 ENCOUNTER — Other Ambulatory Visit (HOSPITAL_COMMUNITY): Payer: Self-pay | Admitting: Psychiatry

## 2016-07-21 DIAGNOSIS — F4322 Adjustment disorder with anxiety: Secondary | ICD-10-CM | POA: Diagnosis not present

## 2016-08-06 DIAGNOSIS — F4322 Adjustment disorder with anxiety: Secondary | ICD-10-CM | POA: Diagnosis not present

## 2016-08-17 DIAGNOSIS — F4322 Adjustment disorder with anxiety: Secondary | ICD-10-CM | POA: Diagnosis not present

## 2016-09-01 ENCOUNTER — Ambulatory Visit: Payer: Self-pay | Admitting: Family Medicine

## 2016-09-09 DIAGNOSIS — J069 Acute upper respiratory infection, unspecified: Secondary | ICD-10-CM | POA: Diagnosis not present

## 2016-09-09 DIAGNOSIS — R509 Fever, unspecified: Secondary | ICD-10-CM | POA: Diagnosis not present

## 2016-09-18 MED FILL — AMOXICILLIN 875 MG TABLET: 875 | 10 days supply | Qty: 20 | Fill #0

## 2016-09-22 ENCOUNTER — Other Ambulatory Visit: Payer: Self-pay | Admitting: Family Medicine

## 2016-09-22 ENCOUNTER — Encounter: Payer: Self-pay | Admitting: Family Medicine

## 2016-09-22 ENCOUNTER — Ambulatory Visit (INDEPENDENT_AMBULATORY_CARE_PROVIDER_SITE_OTHER): Payer: 59 | Admitting: Family Medicine

## 2016-09-22 VITALS — BP 114/73 | HR 69 | Resp 18 | Ht 63.0 in | Wt 151.0 lb

## 2016-09-22 DIAGNOSIS — M810 Age-related osteoporosis without current pathological fracture: Secondary | ICD-10-CM

## 2016-09-22 DIAGNOSIS — Z01411 Encounter for gynecological examination (general) (routine) with abnormal findings: Secondary | ICD-10-CM

## 2016-09-22 DIAGNOSIS — M818 Other osteoporosis without current pathological fracture: Secondary | ICD-10-CM

## 2016-09-22 DIAGNOSIS — Z01419 Encounter for gynecological examination (general) (routine) without abnormal findings: Secondary | ICD-10-CM

## 2016-09-22 DIAGNOSIS — F3341 Major depressive disorder, recurrent, in partial remission: Secondary | ICD-10-CM

## 2016-09-22 DIAGNOSIS — Z1231 Encounter for screening mammogram for malignant neoplasm of breast: Secondary | ICD-10-CM

## 2016-09-22 DIAGNOSIS — F411 Generalized anxiety disorder: Secondary | ICD-10-CM

## 2016-09-22 MED ORDER — ALENDRONATE SODIUM 70 MG PO TABS: 70.0000 mg | ORAL_TABLET | ORAL | 3 refills | Status: DC

## 2016-09-22 MED ORDER — ALENDRONATE-CHOLECALCIFEROL 70-5600 MG-UNIT PO TABS: 1.0000 | ORAL_TABLET | ORAL | 3 refills | Status: DC

## 2016-09-22 NOTE — Progress Notes (Signed)
Subjective:     Stephanie [name] is a 51 y.o. female and is here for a comprehensive physical exam. The patient reports problems - depression. On Prozac 10, is feeling somewhat depressed and being too tired, but mood is improved-->may have worsened with stopping her HRT. On HRT x 1 year, for osteoporosis prevention. Rx ran out. Felt like she was retaining fluid while on HRT. Last bone scan showed osteoporosis in her back. Menopause 2 years ago. Still having the occasional hot flash and vaginal dryness.  Social History   Social History  . Marital status: Married    Spouse name: N/A  . Number of children: N/A  . Years of education: N/A   Occupational History  . RN [employer]   Social History Main Topics  . Smoking status: Never Smoker  . Smokeless tobacco: Never Used  . Alcohol use No  . Drug use: No  . Sexual activity: Yes    Partners: Male    Birth control/ protection: Post-menopausal   Other Topics Concern  . Not on file   Social History Narrative   04/05/12 AM he was born and grew up in [facility]. She has 3 older brothers. She never suffered any abuse. Her father died when she was 80 years of age. She completed her bachelor of science in nursing in 1989, and has been working as an Therapist, sports . She has been married for 21 years, and she and her husband have a 35 year old son and a 44 year old daughter. She affiliates as Psychologist, forensic. She denies any legal involvement. She enjoys hiking and reading. She reports her social support consists of friends from college, and a friend from church. 04/05/2012   Health Maintenance  Topic Date Due  . HIV Screening  12/05/1980  . TETANUS/TDAP  12/05/1984  . PAP SMEAR  12/06/1986  . COLONOSCOPY  12/06/2015  . INFLUENZA VACCINE  11/25/2016  . MAMMOGRAM  07/09/2017    The following portions of the patient's history were reviewed and updated as appropriate: allergies, current medications, past family history, past medical history, past  social history, past surgical history and problem list.  Review of Systems Pertinent items noted in HPI and remainder of comprehensive ROS otherwise negative.   Objective:    BP 114/73 (BP Location: Left Arm, Patient Position: Sitting, Cuff Size: Normal)   Pulse 69   Resp 18   Ht 5\' 3"  (1.6 m)   Wt 151 lb (68.5 kg)   LMP 01/25/2014 (Approximate)   BMI 26.75 kg/m  General appearance: alert, cooperative and appears stated age Head: Normocephalic, without obvious abnormality, atraumatic Neck: no adenopathy, supple, symmetrical, trachea midline and thyroid not enlarged, symmetric, no tenderness/mass/nodules Lungs: clear to auscultation bilaterally Breasts: normal appearance, no masses or tenderness Heart: regular rate and rhythm, S1, S2 normal, no murmur, click, rub or gallop Abdomen: soft, non-tender; bowel sounds normal; no masses,  no organomegaly Pelvic: cervix normal in appearance, external genitalia normal, no adnexal masses or tenderness, no cervical motion tenderness, uterus normal size, shape, and consistency and vagina normal without discharge Extremities: extremities normal, atraumatic, no cyanosis or edema Pulses: 2+ and symmetric Skin: Skin color, texture, turgor normal. No rashes or lesions Lymph nodes: Cervical, supraclavicular, and axillary nodes normal. Neurologic: Grossly normal    Assessment:    Healthy female exam.      Plan:   Problem List Items Addressed This Visit      Unprioritized   Osteoporosis of lumbar spine   Relevant Medications  alendronate (FOSAMAX) 70 MG tablet   Depression, major   Generalized anxiety disorder    Other Visit Diagnoses    Encounter for gynecological examination with abnormal finding    -  Primary   Relevant Orders   Ambulatory referral to Gastroenterology     Continue Prozac Mammogram Colonoscopy scheduled Pap is not due until 2019   See After Visit Summary for Counseling Recommendations

## 2016-09-22 NOTE — Progress Notes (Signed)
Pt here today for annual exam, stopped HRT in 2/18, is now experiencing an increase in hot flashes, vaginal dryness, and mood changes.

## 2016-09-22 NOTE — Patient Instructions (Signed)

## 2016-09-28 ENCOUNTER — Encounter: Payer: Self-pay | Admitting: *Deleted

## 2016-10-09 ENCOUNTER — Other Ambulatory Visit (HOSPITAL_COMMUNITY): Payer: Self-pay | Admitting: Psychiatry

## 2016-10-09 DIAGNOSIS — F411 Generalized anxiety disorder: Secondary | ICD-10-CM

## 2016-10-13 MED FILL — ALENDRONATE NA 70 MG TAB: 70 | 84 days supply | Qty: 12 | Fill #0

## 2016-10-14 ENCOUNTER — Other Ambulatory Visit: Payer: Self-pay | Admitting: Family Medicine

## 2016-10-14 ENCOUNTER — Ambulatory Visit
Admission: RE | Admit: 2016-10-14 | Discharge: 2016-10-14 | Disposition: A | Discharge status: Home / Self Care | Payer: 59 | Source: Ambulatory Visit | Attending: Family Medicine | Admitting: Family Medicine

## 2016-10-14 DIAGNOSIS — M810 Age-related osteoporosis without current pathological fracture: Secondary | ICD-10-CM

## 2016-10-14 DIAGNOSIS — Z1231 Encounter for screening mammogram for malignant neoplasm of breast: Secondary | ICD-10-CM | POA: Diagnosis not present

## 2016-10-14 DIAGNOSIS — F3341 Major depressive disorder, recurrent, in partial remission: Secondary | ICD-10-CM

## 2016-10-14 DIAGNOSIS — M818 Other osteoporosis without current pathological fracture: Principal | ICD-10-CM

## 2016-10-14 DIAGNOSIS — F411 Generalized anxiety disorder: Secondary | ICD-10-CM

## 2016-10-14 DIAGNOSIS — R5383 Other fatigue: Secondary | ICD-10-CM

## 2016-10-14 DIAGNOSIS — Z0000 Encounter for general adult medical examination without abnormal findings: Secondary | ICD-10-CM

## 2016-10-14 MED ORDER — ADRENAL CORTEX 80 MG PO TABS: 1.0000 | ORAL_TABLET | Freq: Every day | ORAL | 3 refills | Status: DC

## 2016-10-14 MED ORDER — FLUOXETINE HCL 10 MG PO CAPS: 10.0000 mg | ORAL_CAPSULE | Freq: Every day | ORAL | 3 refills | Status: DC

## 2016-10-14 MED FILL — FLUoxetine HCL 10 MG CAPS: 10 | 90 days supply | Qty: 90 | Fill #0

## 2016-10-15 ENCOUNTER — Other Ambulatory Visit: Payer: Self-pay | Admitting: Family Medicine

## 2016-10-15 DIAGNOSIS — R928 Other abnormal and inconclusive findings on diagnostic imaging of breast: Secondary | ICD-10-CM

## 2016-10-16 ENCOUNTER — Other Ambulatory Visit: Payer: Self-pay | Admitting: Family Medicine

## 2016-10-16 DIAGNOSIS — M818 Other osteoporosis without current pathological fracture: Principal | ICD-10-CM

## 2016-10-16 DIAGNOSIS — M810 Age-related osteoporosis without current pathological fracture: Secondary | ICD-10-CM

## 2016-10-16 MED ORDER — ADRENAL CORTEX 80 MG PO TABS: 1.0000 | ORAL_TABLET | Freq: Every day | ORAL | 3 refills | Status: DC

## 2016-10-23 ENCOUNTER — Other Ambulatory Visit (HOSPITAL_COMMUNITY)
Admission: AD | Admit: 2016-10-23 | Discharge: 2016-10-23 | Disposition: A | Discharge status: Home / Self Care | Payer: 59 | Source: Ambulatory Visit | Attending: Family Medicine | Admitting: Family Medicine

## 2016-10-23 ENCOUNTER — Telehealth: Payer: Self-pay | Admitting: *Deleted

## 2016-10-23 DIAGNOSIS — M818 Other osteoporosis without current pathological fracture: Secondary | ICD-10-CM | POA: Diagnosis not present

## 2016-10-23 DIAGNOSIS — R5383 Other fatigue: Secondary | ICD-10-CM

## 2016-10-23 DIAGNOSIS — Z0000 Encounter for general adult medical examination without abnormal findings: Secondary | ICD-10-CM | POA: Diagnosis not present

## 2016-10-23 LAB — COMPREHENSIVE METABOLIC PANEL
ALT: 14 U/L (ref 14–54)
AST: 27 U/L (ref 15–41)
Albumin: 4.1 g/dL (ref 3.5–5.0)
Alkaline Phosphatase: 69 U/L (ref 38–126)
Anion gap: 5 (ref 5–15)
BILIRUBIN TOTAL: 0.3 mg/dL (ref 0.3–1.2)
BUN: 18 mg/dL (ref 6–20)
CO2: 30 mmol/L (ref 22–32)
CREATININE: 0.66 mg/dL (ref 0.44–1.00)
Calcium: 9.4 mg/dL (ref 8.9–10.3)
Chloride: 102 mmol/L (ref 101–111)
Glucose, Bld: 88 mg/dL (ref 65–99)
POTASSIUM: 4.2 mmol/L (ref 3.5–5.1)
Sodium: 137 mmol/L (ref 135–145)
TOTAL PROTEIN: 7.2 g/dL (ref 6.5–8.1)

## 2016-10-23 LAB — CBC
HEMATOCRIT: 38.1 % (ref 36.0–46.0)
Hemoglobin: 12.9 g/dL (ref 12.0–15.0)
MCH: 30.3 pg (ref 26.0–34.0)
MCHC: 33.9 g/dL (ref 30.0–36.0)
MCV: 89.4 fL (ref 78.0–100.0)
PLATELETS: 336 10*3/uL (ref 150–400)
RBC: 4.26 MIL/uL (ref 3.87–5.11)
RDW: 12.2 % (ref 11.5–15.5)
WBC: 5.1 10*3/uL (ref 4.0–10.5)

## 2016-10-23 LAB — TSH: TSH: 2.023 u[IU]/mL (ref 0.350–4.500)

## 2016-10-23 LAB — CORTISOL: CORTISOL PLASMA: 9.2 ug/dL

## 2016-10-23 NOTE — Telephone Encounter (Signed)
Pt wants cortisol added to labs for adrenal fatigue

## 2016-10-24 LAB — HEMOGLOBIN A1C
Hgb A1c MFr Bld: 5.4 % (ref 4.8–5.6)
Mean Plasma Glucose: 108 mg/dL

## 2016-10-24 LAB — VITAMIN D 25 HYDROXY (VIT D DEFICIENCY, FRACTURES): Vit D, 25-Hydroxy: 37.1 ng/mL (ref 30.0–100.0)

## 2016-10-27 ENCOUNTER — Ambulatory Visit
Admission: RE | Admit: 2016-10-27 | Discharge: 2016-10-27 | Disposition: A | Discharge status: Home / Self Care | Payer: 59 | Source: Ambulatory Visit | Attending: Family Medicine | Admitting: Family Medicine

## 2016-10-27 ENCOUNTER — Encounter: Payer: Self-pay | Admitting: Family Medicine

## 2016-10-27 ENCOUNTER — Other Ambulatory Visit: Payer: Self-pay | Admitting: Family Medicine

## 2016-10-27 DIAGNOSIS — R928 Other abnormal and inconclusive findings on diagnostic imaging of breast: Secondary | ICD-10-CM

## 2016-10-27 DIAGNOSIS — R922 Inconclusive mammogram: Secondary | ICD-10-CM | POA: Diagnosis not present

## 2016-10-27 DIAGNOSIS — R921 Mammographic calcification found on diagnostic imaging of breast: Secondary | ICD-10-CM | POA: Diagnosis not present

## 2016-11-02 ENCOUNTER — Ambulatory Visit
Admission: RE | Admit: 2016-11-02 | Discharge: 2016-11-02 | Disposition: A | Discharge status: Home / Self Care | Payer: 59 | Source: Ambulatory Visit | Attending: Family Medicine | Admitting: Family Medicine

## 2016-11-02 DIAGNOSIS — R921 Mammographic calcification found on diagnostic imaging of breast: Secondary | ICD-10-CM

## 2016-11-02 DIAGNOSIS — N6012 Diffuse cystic mastopathy of left breast: Secondary | ICD-10-CM | POA: Diagnosis not present

## 2016-11-10 ENCOUNTER — Encounter: Payer: Self-pay | Admitting: Family Medicine

## 2016-11-10 ENCOUNTER — Ambulatory Visit (INDEPENDENT_AMBULATORY_CARE_PROVIDER_SITE_OTHER): Payer: 59 | Admitting: Family Medicine

## 2016-11-10 DIAGNOSIS — M818 Other osteoporosis without current pathological fracture: Secondary | ICD-10-CM

## 2016-11-10 DIAGNOSIS — M810 Age-related osteoporosis without current pathological fracture: Secondary | ICD-10-CM

## 2016-11-10 NOTE — Patient Instructions (Addendum)
Goals:  - Obtain twice as many veg's as protein or carbohydrate foods for both lunch and dinner. - Increase your exercise, aiming for consistently getting: - 30 min personal trainer Francoise Ceo) weekly  - 2 X 20 min additional weight workouts at home (establish a set routine) - 3 X at least 30 min cardio - Decrease artificial sweetener use. Limit Splenda in coffee to 2 pkts, and decrease progressively.

## 2016-11-10 NOTE — Progress Notes (Signed)
Medical Nutrition Therapy:  Appt start time: 1430 end time:  8088.  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   Learning Readiness: Change in progress; Has done [employer] successfully until menopause.  Working with a  Garment/textile technologist for past 6 wks.  Recording intake and ex in My[employer]itness[employer].  Started tracking food intake 10 days ago.  Stephanie [name] is trying to limit kcal intake to 1,000.  Body comp analysis with Tanita scale suggested a BMR of 1100 kcal.    We talked about getting pleasure and satisfaction from food, which Stephanie [name] has not done much for about the past year.  She is very frustrated that making the same food and exercise efforts she made 6 yrs ago is not making any difference in her weight, and she seems a bit fearful of increasing intake in any way b/c of this; yet, I suspect her restrictive diet may be contributing to muscle loss, thereby decreasing RMR, as well as contributing to being occasionally drawn to foods like yesterday's pudding or daily coffee with 3 pkts of artificial sweetener.    Usual eating pattern includes 3 meals and 2 snacks per day. Frequent foods and beverages include 1-2 diet sodas/d, 1 c coffee w/ 3 pkts Splenda, 2 oz s-f, f-f vanilla creamer.  Avoided foods include most fruits, esp peaches and bananas.   Usual physical activity includes 30 min personal trainer (mostly weight training) 1 X wk; 30-60 min walking 3 X wk; 20 min DBs 1-2 X wk at home.  (Was running 30 min 3 X wk, but currently has sore Achilles tendon, so taking a break from running for a while.)  24-hr recall: (Up at 7:45 AM) B (9:30 AM)-   1 c coffee, 3 pkts Splenda, 2 oz s-f, f-f vanilla creamer; 1/4 c granola, 1/2 c l-f yogurt, 1/4 c pnapple Snk ( AM)-   water L (1 PM)-  1 large salad, 3 oz chx brst, f-f drsng (60 kcal), water Snk (2 PM)-  1 Oreo pudding parfait w/ whipped crm D (8:30 PM)-  1/2 c Grk f-f vanilla yog, 5 str'ber's, 1/4 c granola,  water Snk ( PM)-  --- Typical day? Yes.  Although dinner is often grilled chx salad.    Progress Towards Goal(s):  In progress.   Nutritional Diagnosis:  NI-5.11.1 Predicted suboptimal nutrient intake As related to bone health.  As evidenced by diagnosed osteoporosis of the spine.    Intervention: Nutrition education.   Handouts given during visit include:  AVS  Goals Sheet  Barriers to learning/adherence to lifestyle change: Hesitation to loosen tight control over kcal intake b/c of  fear of weight gain.   Monitoring/Evaluation:  Dietary intake, exercise, and body weight in 4 week(s).

## 2016-11-11 ENCOUNTER — Telehealth: Payer: Self-pay | Admitting: Family Medicine

## 2016-11-11 NOTE — Telephone Encounter (Signed)
Pt has hurt her achilles tendon, she noticed a knot on it and now when she walks she is in pain, last night it was the worse it has ever been. She has a new patient appt for 8/6, she would liek to know what she can do until then to prevent making the injury worse and to ease the pain.

## 2016-11-11 NOTE — Telephone Encounter (Signed)
Called patient and she would like to wait for an appointment with Dr. Tamala Julian. Told her to let us know if she would like to be seen sooner and we could have her see Dr. Paulla Fore. She wanted to know what she could do until seen. Recommended light stationary bike and icing every 20 minutes. Patient voices understanding. Will call her if we have a cancellation sooner.

## 2016-11-11 NOTE — Telephone Encounter (Incomplete)
Called patient and she would like to wait for Dr. Tamala [name] to

## 2016-11-28 NOTE — Progress Notes (Signed)
[facility] [address], [name] [address] Phone: [phone] Subjective:    I'm seeing this patient by the request  of:  [name], MD   CC: Posterior ankle pain  [id number]  Stephanie [name] is a 51 y.o. female coming in with complaint of left posterior ankle pain. Feels she injured her Achilles. This happened 2 weeks ago when she noticed a knot on the back of her achilles. The pain is dull. She is not sure of the mechanism of injury but does note that she went on a walk 2 weeks ago when she had pain in her achilles into the medial side of her foot and into midfoot. Patient has tried motrin and ice which reduces the pain but does not make it go away entirely. Patient does run but has been unable to run.   Onset- 2 weeks ago Location- left achilles tendon Duration-intermittent Character-dull Aggravating factors- longer walks, stairs Reliving factors- ice, IBU Therapies tried- Ice, IBU Severity- 0/10 today, with activity 3/10     Past Medical History:  Diagnosis Date  . Anxiety   . Breast mass, left   . Depression   . Headache   . Osteopenia    Past Surgical History:  Procedure Laterality Date  . BREAST BIOPSY Left 07/10/2015   high risk stereo  . BREAST EXCISIONAL BIOPSY Left 08/15/2015   ADH  . BREAST LUMPECTOMY WITH RADIOACTIVE SEED LOCALIZATION Left 08/15/2015   Procedure:  LEFT BREAST LUMPECTOMY WITH RADIOACTIVE SEED LOCALIZATION;  Surgeon: Erroll Luna, MD;  Location: [place];  Service: General;  Laterality: Left;  . TONSILLECTOMY  Age 6  . TRIGGER FINGER RELEASE  2010   Right Thumb   Social History   Social History  . Marital status: Married    Spouse name: N/A  . Number of children: N/A  . Years of education: N/A   Occupational History  . RN [employer]   Social History Main Topics  . Smoking status: Never Smoker  . Smokeless tobacco: Never Used  . Alcohol use No  . Drug use:  No  . Sexual activity: Yes    Partners: Male    Birth control/ protection: Post-menopausal   Other Topics Concern  . None   Social History Narrative   04/05/12 AM he was born and grew up in [facility]. She has 3 older brothers. She never suffered any abuse. Her father died when she was 89 years of age. She completed her bachelor of science in nursing in 1989, and has been working as an Therapist, sports . She has been married for 21 years, and she and her husband have a 22 year old son and a 4 year old daughter. She affiliates as Psychologist, forensic. She denies any legal involvement. She enjoys hiking and reading. She reports her social support consists of friends from college, and a friend from church. 04/05/2012   Allergies  Allergen Reactions  . Wellbutrin Xl [Bupropion] Hives   Family History  Problem Relation Age of Onset  . Depression Brother   . Hypertension Mother   . Osteoporosis Mother   . ADD / ADHD Son   . ADD / ADHD Daughter      Past medical history, social, surgical and family history all reviewed in electronic medical record.  No pertanent information unless stated regarding to the chief complaint.   Review of Systems:Review of systems updated and as accurate as of 11/30/16  No headache, visual changes, nausea, vomiting,  diarrhea, constipation, dizziness, abdominal pain, skin rash, fevers, chills, night sweats, weight loss, swollen lymph nodes, body aches, joint swelling, muscle aches, chest pain, shortness of breath, mood changes.   Objective  Blood pressure 110/82, pulse 68, weight 153 lb (69.4 kg), last menstrual period 01/25/2014. Systems examined below as of 11/30/16   General: No apparent distress alert and oriented x3 mood and affect normal, dressed appropriately.  HEENT: Pupils equal, extraocular movements intact  Respiratory: Patient's speak in full sentences and does not appear short of breath  Cardiovascular: No lower extremity edema, non tender, no erythema    Skin: Warm dry intact with no signs of infection or rash on extremities or on axial skeleton.  Abdomen: Soft nontender  Neuro: Cranial nerves II through XII are intact, neurovascularly intact in all extremities with 2+ DTRs and 2+ pulses.  Lymph: No lymphadenopathy of posterior or anterior cervical chain or axillae bilaterally.  Gait normal with good balance and coordination.  MSK:  Non tender with full range of motion and good stability and symmetric strength and tone of shoulders, elbows, wrist, hip, knees bilaterally.  Ankle: Left No visible erythema or swelling. Range of motion is full in all directions. Strength is 5/5 in all directions. Stable lateral and medial ligaments; squeeze test and kleiger test unremarkable; Talar dome nontender; No pain at base of 5th MT; No tenderness over cuboid; No tenderness over N spot or navicular prominence No tenderness on posterior aspects of lateral and medial malleolus No sign of peroneal tendon subluxations or tenderness to palpation mild tenderness over the Achilles noted mostly at its insertion. Negative tarsal tunnel tinel's Able to walk 4 steps.  MSK US performed of: Left ankle This study was ordered, performed, and interpreted by Charlann Boxer D.O.  Foot/Ankle:   All structures visualized.   Talar dome unremarkable  Ankle mortise without effusion. Peroneus longus and brevis tendons unremarkable on long and transverse views without sheath effusions. Posterior tibialis, flexor hallucis longus, and flexor digitorum longus tendons unremarkable on long and transverse views without sheath effusions. Achilles tendon visualized has some mild nodule noted. No tearing noted. No increasing Doppler flow or hypoechoic changes Anterior Talofibular Ligament and Calcaneofibular Ligaments unremarkable and intact. Deltoid Ligament unremarkable and intact. Plantar fascia intact and without effusion, normal thickness. No increased doppler signal, cap sign,  or thickening of tibial cortex. Power doppler signal normal.  IMPRESSION:  Mild Achilles tendinosis  [address]; 15 additional minutes spent for Therapeutic exercises as stated in above notes.  This included exercises focusing on stretching, strengthening, with significant focus on eccentric aspects.   Long term goals include an improvement in range of motion, strength, endurance as well as avoiding reinjury. Patient's frequency would include in 1-2 times a day, 3-5 times a week for a duration of 6-12 weeks. Ankle strengthening that included:  Basic range of motion exercises to allow proper full motion at ankle Stretching of the lower leg and hamstrings  Theraband exercises for the lower leg - inversion, eversion, dorsiflexion and plantarflexion each to be completed with a theraband Balance exercises to increase proprioception Weight bearing exercises to increase strength and balance  Proper technique shown and discussed handout in great detail with ATC.  All questions were discussed and answered.     Impression and Recommendations:     This case required medical decision making of moderate complexity.      Note: This dictation was prepared with Dragon dictation along with smaller phrase technology. Any transcriptional errors that result from  this process are unintentional.

## 2016-11-30 ENCOUNTER — Encounter: Payer: Self-pay | Admitting: Family Medicine

## 2016-11-30 ENCOUNTER — Ambulatory Visit (INDEPENDENT_AMBULATORY_CARE_PROVIDER_SITE_OTHER): Payer: 59 | Admitting: Family Medicine

## 2016-11-30 DIAGNOSIS — M6788 Other specified disorders of synovium and tendon, other site: Secondary | ICD-10-CM | POA: Insufficient documentation

## 2016-11-30 DIAGNOSIS — M7662 Achilles tendinitis, left leg: Secondary | ICD-10-CM

## 2016-11-30 MED ORDER — DICLOFENAC SODIUM 2 % TD SOLN: 2.0000 "application " | Freq: Two times a day (BID) | TRANSDERMAL | 3 refills | Status: DC

## 2016-11-30 NOTE — Assessment & Plan Note (Signed)
Mild overall. Discussed with patient at great length, we discussed proper shoes, heel lift, home exercises and work with Product/process development scientist. Topical anti-inflammatory's prescribed. We discussed which activities to do a which ones to avoid. Patient will continue to try to lose weight. Follow-up with me again in 4 weeks.

## 2016-11-30 NOTE — Patient Instructions (Signed)
Good to see you.  Ice 20 minutes 2 times daily. Usually after activity and before bed. Exercises 3 times a week.  Heel lift in the shoe.  pennsaid pinkie amount topically 2 times daily as needed.  Try waking up and drinking 2 cups of waterright away to boost metabolism.  Over the counter  Vitamin D 2000 IU dialy  Turmeric 500mg  twice daily  See me again in 4 weeks.

## 2016-12-07 ENCOUNTER — Encounter: Payer: Self-pay | Admitting: Family Medicine

## 2016-12-07 ENCOUNTER — Ambulatory Visit (INDEPENDENT_AMBULATORY_CARE_PROVIDER_SITE_OTHER): Payer: 59 | Admitting: Family Medicine

## 2016-12-07 DIAGNOSIS — Z713 Dietary counseling and surveillance: Secondary | ICD-10-CM

## 2016-12-07 NOTE — Patient Instructions (Addendum)
-   A couple points about dietary protein:  1. When you are not getting adequate calories (energy), then any protein you do consume is likely to be used not for meeting protein needs, but to meet energy needs.  This is why people can actually become protein-deficient when eating a "normal" protein intake, but not getting adequate energy.   2. There is currently MORE concern about getting enough protein (than getting too much) with respect to bone health.  You absolutely need enough protein to build bone.   3. The RDA for protein is 0.8 grams per kg body weight:  68 kg X 0.8  = 54.4 grams of protein per day.  It is likely that this is a low number (i.e., you need more) b/c there is much consensus that we need more protein as we age.  In addn, if you are consuming a low-kcal diet, protein needs are greater.)  A good protein goal for you is at least 60 grams/day.    - Each ounce of meat/fish/poultry/cheese, and each 1 egg = 7 grams of protein  - Each 8 oz of milk = 8 grams of protein  - Each 2 tbsp of nut butter = 5-6 grams of protein  - Each 1 cup of beans = 10-20 grams of protein   Goals: 1. Increase breakfast to include more protein, e.g., double your yogurt intake to a full cup, and increase fruit to at least 3/4 cup.  (Keep in mind: Berries are very low in kcal and sugar, and high in fiber.) 2. Aim for roughly 4 oz of meat/fish/poultry at most lunch and dinner meals.  (Also obtain veg's at both lunch and dinner, to equal double the volume of protein or carb foods.)    - Carb foods at OK at meals; just be conscious about portion size.    - The best carbs are those that also include fiber.    - Continue your run/walk progression, monitoring your ankle, and backing off as needed.  (Use stationary bike as needed; find some good podcasts!)  Goals to track on your Goals Sheet:  1. Increase protein: >1 full cup yogurt for breakfast, & >4 oz meat/fish (or equivalent) for lunch & dinner.  2. Physical  activity:   - 30 min personal trainer Francoise Ceo) weekly   - 20 min additional weight workouts at home 2 X wk (establish a set routine)  - At least 30 min run/walk 2 X wk; increase as tolerated and per Dr. Tamala Julian 3. Continue to progressively decrease artificial sweetener use: Limit Splenda to 2 pkts/coffee, aiming for none in yogurt.

## 2016-12-07 NOTE — Progress Notes (Signed)
Medical Nutrition Therapy:  Appt start time: 1638 end time:  1630.  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  [name] Clyde Canterbury) has had continued ankle problems for which she has seen Charlann Boxer, MD.  She is doing some ankle strengthening exercises; ran for the first time yesterday.  She is taking turmeric, icing nightly and after running.  She has been doing less exercise b/c of her ankle, but she has been getting more vegetables.  She recently did the [facility] assessment; using the [place] scale, muscle and water content appears to have gone up a slight amount, and % fat was about the same.    [name] was still targeting 1100 kcal/day, but last Demetrius Charity and Fri she ate a handful of nuts about 3 X each afternoon, per Dr. Tamala Julian. Said she felt better by the end of the day (less sluggish).  She is still concerned about the kcal content she is adding to her diet.  I discussed with her the potential effect of inadequate protein, especially in light of her very low energy intake, emphasizing the importance of protein to proper bone integrity.    Recent physical activity includes 30 min personal trainer (mostly weight training) 1 X wk and 20 min DB weights at home 1-2 X wk.  No walking since her ankle problems, but will plan on 30 min run/walk 2 X wk.    24-hr recall suggests intake of ~1030 kcal:  (Up at 10 AM) B (10 AM)-  1/4 c [facility] granola, water   120 11:30 - Ran/walked 25 min  Snk ( AM)-  water L (3 PM)-  [employer] SW chx salad (no dsng), diet Pepsi    350 kcal Snk ( PM)-  --- D (7 PM)-  1/2 c plain fat-free Greek yogurt, 4 str'ber's, 3/4 c [employer] granola 490 Snk (9 PM)-  1 c diet Coke, 1 Ghiradelli caramel squares       73 kcal Typical day? No.  Weekends are not the same as weekdays; eating times tend to be irregular.  Dinner is usually a protein and veg's.    Progress Towards Goal(s):  In progress.   Nutritional Diagnosis:  No  significant changes in [id number] Predicted suboptimal nutrient intake As related to bone health (protein and energy).  As evidenced by diagnosed osteoporosis of the spine.    Intervention: Nutrition education.   Handouts given during visit include:  AVS  Goals Sheet  Barriers to learning/adherence to lifestyle change: Hesitation to loosen tight control over kcal intake b/c of  fear of weight gain.   Monitoring/Evaluation:  Dietary intake, exercise, and body weight in 8 week(s).

## 2016-12-10 ENCOUNTER — Other Ambulatory Visit: Payer: Self-pay | Admitting: General Surgery

## 2016-12-10 DIAGNOSIS — R921 Mammographic calcification found on diagnostic imaging of breast: Secondary | ICD-10-CM | POA: Diagnosis not present

## 2016-12-14 ENCOUNTER — Other Ambulatory Visit: Payer: Self-pay | Admitting: General Surgery

## 2016-12-14 DIAGNOSIS — R921 Mammographic calcification found on diagnostic imaging of breast: Secondary | ICD-10-CM

## 2016-12-23 ENCOUNTER — Encounter (HOSPITAL_BASED_OUTPATIENT_CLINIC_OR_DEPARTMENT_OTHER): Payer: Self-pay | Admitting: *Deleted

## 2016-12-28 NOTE — Progress Notes (Signed)
[facility] [place], [place] [address] Phone: [phone] Subjective:    I'm seeing this patient by the request  of:    CC: Ankle pain follow up  XTK:[id number]  Stephanie [name] is a 51 y.o. female coming in with complaint of ankle pain. Found to have a very mild Achilles tendinosis on the left side. Given home exercises, icing regimen, which x-rays a doing which ones to avoid. Patient states that she has been running on a flat greenway. She has pain in the same spot after mile 2. She ices it after a run which does help but she is still experiencing soreness on the days that she works out. She does not have pain on days that she does not work out. She believes that the tumeric has been helping her healing process.       Past Medical History:  Diagnosis Date  . Anxiety   . Breast mass, left   . Complication of anesthesia    felt lethargic x 4 days following last surgery  . Depression   . Headache   . Osteopenia    Past Surgical History:  Procedure Laterality Date  . BREAST BIOPSY Left 07/10/2015   high risk stereo  . BREAST EXCISIONAL BIOPSY Left 08/15/2015   ADH  . BREAST LUMPECTOMY WITH RADIOACTIVE SEED LOCALIZATION Left 08/15/2015   Procedure:  LEFT BREAST LUMPECTOMY WITH RADIOACTIVE SEED LOCALIZATION;  Surgeon: Erroll Luna, MD;  Location: [facility];  Service: General;  Laterality: Left;  . TONSILLECTOMY  Age 87  . TRIGGER FINGER RELEASE  2010   Right Thumb   Social History   Social History  . Marital status: Married    Spouse name: N/A  . Number of children: N/A  . Years of education: N/A   Occupational History  . RN [employer]   Social History Main Topics  . Smoking status: Never Smoker  . Smokeless tobacco: Never Used  . Alcohol use No  . Drug use: No  . Sexual activity: Yes    Partners: Male    Birth control/ protection: Post-menopausal   Other Topics Concern  . Not on file   Social  History Narrative   04/05/12 AM he was born and grew up in [place]. She has 3 older brothers. She never suffered any abuse. Her father died when she was 68 years of age. She completed her bachelor of science in nursing in 1989, and has been working as an Therapist, sports . She has been married for 21 years, and she and her husband have a 43 year old son and a 35 year old daughter. She affiliates as Psychologist, forensic. She denies any legal involvement. She enjoys hiking and reading. She reports her social support consists of friends from college, and a friend from church. 04/05/2012   Allergies  Allergen Reactions  . Wellbutrin Xl [Bupropion] Hives   Family History  Problem Relation Age of Onset  . Depression Brother   . Hypertension Mother   . Osteoporosis Mother   . ADD / ADHD Son   . ADD / ADHD Daughter      Past medical history, social, surgical and family history all reviewed in electronic medical record.  No pertanent information unless stated regarding to the chief complaint.   Review of Systems:Review of systems updated and as accurate as of 12/28/16  No headache, visual changes, nausea, vomiting, diarrhea, constipation, dizziness, abdominal pain, skin rash, fevers, chills, night sweats, weight loss, swollen  lymph nodes, body aches, joint swelling,  chest pain, shortness of breath, mood changes. Positive muscle aches  Objective  Last menstrual period 01/25/2014. Systems examined below as of 12/28/16   General: No apparent distress alert and oriented x3 mood and affect normal, dressed appropriately.  HEENT: Pupils equal, extraocular movements intact  Respiratory: Patient's speak in full sentences and does not appear short of breath  Cardiovascular: No lower extremity edema, non tender, no erythema  Skin: Warm dry intact with no signs of infection or rash on extremities or on axial skeleton.  Abdomen: Soft nontender  Neuro: Cranial nerves II through XII are intact, neurovascularly intact  in all extremities with 2+ DTRs and 2+ pulses.  Lymph: No lymphadenopathy of posterior or anterior cervical chain or axillae bilaterally.  Gait normal with good balance and coordination.  MSK:  Non tender with full range of motion and good stability and symmetric strength and tone of shoulders, elbows, wrist, hip, knee and bilaterally.  Ankle: Left No visible erythema or swelling. Range of motion is full in all directions. Strength is 5/5 in all directions. Stable lateral and medial ligaments; squeeze test and kleiger test unremarkable; Talar dome nontender; No pain at base of 5th MT; No tenderness over cuboid; No tenderness over N spot or navicular prominence No tenderness on posterior aspects of lateral and medial malleolus No sign of peroneal tendon subluxations or tenderness to palpation Negative tarsal tunnel tinel's Able to walk 4 steps. Contralateral ankle unremarkable   Impression and Recommendations:     This case required medical decision making of moderate complexity.      Note: This dictation was prepared with Dragon dictation along with smaller phrase technology. Any transcriptional errors that result from this process are unintentional.

## 2016-12-29 ENCOUNTER — Ambulatory Visit
Admission: RE | Admit: 2016-12-29 | Discharge: 2016-12-29 | Disposition: A | Discharge status: Home / Self Care | Payer: 59 | Source: Ambulatory Visit | Attending: General Surgery | Admitting: General Surgery

## 2016-12-29 ENCOUNTER — Ambulatory Visit (INDEPENDENT_AMBULATORY_CARE_PROVIDER_SITE_OTHER): Payer: 59 | Admitting: Family Medicine

## 2016-12-29 ENCOUNTER — Ambulatory Visit: Payer: Self-pay

## 2016-12-29 ENCOUNTER — Encounter: Payer: Self-pay | Admitting: Family Medicine

## 2016-12-29 VITALS — BP 118/84 | HR 72 | Wt 151.0 lb

## 2016-12-29 DIAGNOSIS — M7662 Achilles tendinitis, left leg: Secondary | ICD-10-CM

## 2016-12-29 DIAGNOSIS — M6788 Other specified disorders of synovium and tendon, other site: Secondary | ICD-10-CM

## 2016-12-29 DIAGNOSIS — R921 Mammographic calcification found on diagnostic imaging of breast: Secondary | ICD-10-CM

## 2016-12-29 DIAGNOSIS — M79672 Pain in left foot: Secondary | ICD-10-CM

## 2016-12-29 DIAGNOSIS — N6082 Other benign mammary dysplasias of left breast: Secondary | ICD-10-CM | POA: Diagnosis not present

## 2016-12-29 NOTE — Assessment & Plan Note (Signed)
Significant improvement from previous exam. We discussed continuing the same regimen at this time. Likely will be pain-free within the number of weeks. We discussed icing regimen patient will follow-up with me again in 6 weeks if not completely resolved.

## 2016-12-29 NOTE — Patient Instructions (Addendum)
Good to see you  Ice is your friend but not as necessary  Increase activity as tolerated Start the turmeric again next week See me again in 6 weeks if not perfect

## 2016-12-29 NOTE — Progress Notes (Signed)
Ensure pre surgery drink given with instructions to complete by 0730 dos, pt verbalized understanding. 

## 2016-12-30 ENCOUNTER — Encounter (HOSPITAL_BASED_OUTPATIENT_CLINIC_OR_DEPARTMENT_OTHER): Payer: Self-pay | Admitting: *Deleted

## 2016-12-30 ENCOUNTER — Ambulatory Visit (HOSPITAL_BASED_OUTPATIENT_CLINIC_OR_DEPARTMENT_OTHER): Payer: 59 | Admitting: Anesthesiology

## 2016-12-30 ENCOUNTER — Ambulatory Visit
Admission: RE | Admit: 2016-12-30 | Discharge: 2016-12-30 | Disposition: A | Discharge status: Home / Self Care | Payer: 59 | Source: Ambulatory Visit | Attending: General Surgery | Admitting: General Surgery

## 2016-12-30 ENCOUNTER — Encounter (HOSPITAL_BASED_OUTPATIENT_CLINIC_OR_DEPARTMENT_OTHER)
Admission: RE | Disposition: A | Discharge status: Home / Self Care | Payer: Self-pay | Source: Ambulatory Visit | Attending: General Surgery

## 2016-12-30 ENCOUNTER — Ambulatory Visit (HOSPITAL_BASED_OUTPATIENT_CLINIC_OR_DEPARTMENT_OTHER)
Admission: RE | Admit: 2016-12-30 | Discharge: 2016-12-30 | Disposition: A | Discharge status: Home / Self Care | Payer: 59 | Source: Ambulatory Visit | Attending: General Surgery | Admitting: General Surgery

## 2016-12-30 DIAGNOSIS — N6022 Fibroadenosis of left breast: Secondary | ICD-10-CM | POA: Diagnosis not present

## 2016-12-30 DIAGNOSIS — F419 Anxiety disorder, unspecified: Secondary | ICD-10-CM | POA: Insufficient documentation

## 2016-12-30 DIAGNOSIS — D4862 Neoplasm of uncertain behavior of left breast: Secondary | ICD-10-CM | POA: Diagnosis not present

## 2016-12-30 DIAGNOSIS — F329 Major depressive disorder, single episode, unspecified: Secondary | ICD-10-CM | POA: Insufficient documentation

## 2016-12-30 DIAGNOSIS — M7662 Achilles tendinitis, left leg: Secondary | ICD-10-CM | POA: Diagnosis not present

## 2016-12-30 DIAGNOSIS — F909 Attention-deficit hyperactivity disorder, unspecified type: Secondary | ICD-10-CM | POA: Diagnosis not present

## 2016-12-30 DIAGNOSIS — N6489 Other specified disorders of breast: Secondary | ICD-10-CM | POA: Diagnosis not present

## 2016-12-30 DIAGNOSIS — M8580 Other specified disorders of bone density and structure, unspecified site: Secondary | ICD-10-CM | POA: Diagnosis not present

## 2016-12-30 DIAGNOSIS — N6012 Diffuse cystic mastopathy of left breast: Secondary | ICD-10-CM | POA: Diagnosis not present

## 2016-12-30 DIAGNOSIS — R921 Mammographic calcification found on diagnostic imaging of breast: Secondary | ICD-10-CM | POA: Diagnosis not present

## 2016-12-30 DIAGNOSIS — N6092 Unspecified benign mammary dysplasia of left breast: Secondary | ICD-10-CM | POA: Insufficient documentation

## 2016-12-30 DIAGNOSIS — Z79899 Other long term (current) drug therapy: Secondary | ICD-10-CM | POA: Insufficient documentation

## 2016-12-30 DIAGNOSIS — N6082 Other benign mammary dysplasias of left breast: Secondary | ICD-10-CM | POA: Diagnosis not present

## 2016-12-30 HISTORY — PX: EXCISION OF BREAST BIOPSY: SHX5822

## 2016-12-30 HISTORY — PX: RADIOACTIVE SEED GUIDED EXCISIONAL BREAST BIOPSY: SHX6490

## 2016-12-30 HISTORY — DX: Adverse effect of unspecified anesthetic, initial encounter: T41.45XA

## 2016-12-30 HISTORY — DX: Other complications of anesthesia, initial encounter: T88.59XA

## 2016-12-30 SURGERY — RADIOACTIVE SEED GUIDED BREAST BIOPSY
Anesthesia: General | Site: Breast | Laterality: Left

## 2016-12-30 MED ORDER — PROPOFOL 10 MG/ML IV BOLUS
INTRAVENOUS | Status: AC
  Filled 2016-12-30: qty 20

## 2016-12-30 MED ORDER — ACETAMINOPHEN 500 MG PO TABS
ORAL_TABLET | ORAL | Status: AC
  Filled 2016-12-30: qty 2

## 2016-12-30 MED ORDER — CELECOXIB 200 MG PO CAPS
200.0000 mg | ORAL_CAPSULE | ORAL | Status: AC
  Administered 2016-12-30: 200 mg via ORAL

## 2016-12-30 MED ORDER — FENTANYL CITRATE (PF) 100 MCG/2ML IJ SOLN: 25.0000 ug | INTRAMUSCULAR | Status: DC | PRN

## 2016-12-30 MED ORDER — CEFAZOLIN SODIUM-DEXTROSE 2-4 GM/100ML-% IV SOLN
INTRAVENOUS | Status: AC
  Filled 2016-12-30: qty 100

## 2016-12-30 MED ORDER — LACTATED RINGERS IV SOLN
INTRAVENOUS | Status: DC
  Administered 2016-12-30 (×2): via INTRAVENOUS

## 2016-12-30 MED ORDER — DEXAMETHASONE SODIUM PHOSPHATE 4 MG/ML IJ SOLN
INTRAMUSCULAR | Status: DC | PRN
  Administered 2016-12-30: 10 mg via INTRAVENOUS

## 2016-12-30 MED ORDER — OXYCODONE HCL 5 MG PO TABS: 5.0000 mg | ORAL_TABLET | Freq: Once | ORAL | Status: DC | PRN

## 2016-12-30 MED ORDER — DEXAMETHASONE SODIUM PHOSPHATE 10 MG/ML IJ SOLN
INTRAMUSCULAR | Status: AC
  Filled 2016-12-30: qty 1

## 2016-12-30 MED ORDER — GABAPENTIN 300 MG PO CAPS
300.0000 mg | ORAL_CAPSULE | ORAL | Status: AC
  Administered 2016-12-30: 300 mg via ORAL

## 2016-12-30 MED ORDER — SCOPOLAMINE 1 MG/3DAYS TD PT72: 1.0000 | MEDICATED_PATCH | Freq: Once | TRANSDERMAL | Status: DC | PRN

## 2016-12-30 MED ORDER — MIDAZOLAM HCL 2 MG/2ML IJ SOLN
1.0000 mg | INTRAMUSCULAR | Status: DC | PRN
  Administered 2016-12-30: 2 mg via INTRAVENOUS

## 2016-12-30 MED ORDER — ACETAMINOPHEN 500 MG PO TABS
1000.0000 mg | ORAL_TABLET | ORAL | Status: AC
  Administered 2016-12-30: 1000 mg via ORAL

## 2016-12-30 MED ORDER — CELECOXIB 200 MG PO CAPS
ORAL_CAPSULE | ORAL | Status: AC
  Filled 2016-12-30: qty 1

## 2016-12-30 MED ORDER — FENTANYL CITRATE (PF) 100 MCG/2ML IJ SOLN
INTRAMUSCULAR | Status: AC
  Filled 2016-12-30: qty 2

## 2016-12-30 MED ORDER — MEPERIDINE HCL 25 MG/ML IJ SOLN: 6.2500 mg | INTRAMUSCULAR | Status: DC | PRN

## 2016-12-30 MED ORDER — ONDANSETRON HCL 4 MG/2ML IJ SOLN
INTRAMUSCULAR | Status: DC | PRN
  Administered 2016-12-30: 4 mg via INTRAVENOUS

## 2016-12-30 MED ORDER — CEFAZOLIN SODIUM-DEXTROSE 2-4 GM/100ML-% IV SOLN
2.0000 g | INTRAVENOUS | Status: AC
  Administered 2016-12-30: 2 g via INTRAVENOUS

## 2016-12-30 MED ORDER — PROMETHAZINE HCL 25 MG/ML IJ SOLN: 6.2500 mg | INTRAMUSCULAR | Status: DC | PRN

## 2016-12-30 MED ORDER — OXYCODONE HCL 5 MG/5ML PO SOLN: 5.0000 mg | Freq: Once | ORAL | Status: DC | PRN

## 2016-12-30 MED ORDER — BUPIVACAINE HCL (PF) 0.5 % IJ SOLN
INTRAMUSCULAR | Status: DC | PRN
  Administered 2016-12-30: 10 mL

## 2016-12-30 MED ORDER — MIDAZOLAM HCL 2 MG/2ML IJ SOLN
INTRAMUSCULAR | Status: AC
  Filled 2016-12-30: qty 2

## 2016-12-30 MED ORDER — FENTANYL CITRATE (PF) 100 MCG/2ML IJ SOLN
50.0000 ug | INTRAMUSCULAR | Status: DC | PRN
Start: 2016-12-30 — End: 2016-12-30
  Administered 2016-12-30: 100 ug via INTRAVENOUS

## 2016-12-30 MED ORDER — GABAPENTIN 300 MG PO CAPS
ORAL_CAPSULE | ORAL | Status: AC
  Filled 2016-12-30: qty 1

## 2016-12-30 MED ORDER — ONDANSETRON HCL 4 MG/2ML IJ SOLN
INTRAMUSCULAR | Status: AC
  Filled 2016-12-30: qty 2

## 2016-12-30 SURGICAL SUPPLY — 59 items
ADH SKN CLS APL DERMABOND .7 (GAUZE/BANDAGES/DRESSINGS) ×1
APPLIER CLIP 9.375 MED OPEN (MISCELLANEOUS)
APR CLP MED 9.3 20 MLT OPN (MISCELLANEOUS)
BINDER BREAST LRG (GAUZE/BANDAGES/DRESSINGS) ×1 IMPLANT
BINDER BREAST MEDIUM (GAUZE/BANDAGES/DRESSINGS) IMPLANT
BINDER BREAST XLRG (GAUZE/BANDAGES/DRESSINGS) IMPLANT
BINDER BREAST XXLRG (GAUZE/BANDAGES/DRESSINGS) IMPLANT
BLADE SURG 15 STRL LF DISP TIS (BLADE) ×1 IMPLANT
BLADE SURG 15 STRL SS (BLADE) ×2
CANISTER SUC SOCK COL 7IN (MISCELLANEOUS) IMPLANT
CANISTER SUCT 1200ML W/VALVE (MISCELLANEOUS) IMPLANT
CHLORAPREP W/TINT 26ML (MISCELLANEOUS) ×2 IMPLANT
CLIP APPLIE 9.375 MED OPEN (MISCELLANEOUS) IMPLANT
CLIP VESOCCLUDE SM WIDE 6/CT (CLIP) IMPLANT
COVER BACK TABLE 60X90IN (DRAPES) ×2 IMPLANT
COVER MAYO STAND STRL (DRAPES) ×2 IMPLANT
COVER PROBE W GEL 5X96 (DRAPES) ×2 IMPLANT
DECANTER SPIKE VIAL GLASS SM (MISCELLANEOUS) IMPLANT
DERMABOND ADVANCED (GAUZE/BANDAGES/DRESSINGS) ×1
DERMABOND ADVANCED .7 DNX12 (GAUZE/BANDAGES/DRESSINGS) ×1 IMPLANT
DEVICE DUBIN W/COMP PLATE 8390 (MISCELLANEOUS) ×2 IMPLANT
DRAPE LAPAROSCOPIC ABDOMINAL (DRAPES) ×2 IMPLANT
DRAPE UTILITY XL STRL (DRAPES) ×2 IMPLANT
DRSG TEGADERM 4X4.75 (GAUZE/BANDAGES/DRESSINGS) IMPLANT
ELECT COATED BLADE 2.86 ST (ELECTRODE) ×2 IMPLANT
ELECT REM PT RETURN 9FT ADLT (ELECTROSURGICAL) ×2
ELECTRODE REM PT RTRN 9FT ADLT (ELECTROSURGICAL) ×1 IMPLANT
GAUZE SPONGE 4X4 12PLY STRL LF (GAUZE/BANDAGES/DRESSINGS) IMPLANT
GLOVE BIO SURGEON STRL SZ7 (GLOVE) ×4 IMPLANT
GLOVE BIOGEL PI IND STRL 7.5 (GLOVE) ×1 IMPLANT
GLOVE BIOGEL PI INDICATOR 7.5 (GLOVE) ×1
GLOVE EXAM NITRILE MD LF STRL (GLOVE) ×1 IMPLANT
GLOVE SURG SS PI 7.0 STRL IVOR (GLOVE) ×1 IMPLANT
GOWN STRL REUS W/ TWL LRG LVL3 (GOWN DISPOSABLE) ×2 IMPLANT
GOWN STRL REUS W/TWL LRG LVL3 (GOWN DISPOSABLE) ×4
HEMOSTAT ARISTA ABSORB 3G PWDR (MISCELLANEOUS) IMPLANT
ILLUMINATOR WAVEGUIDE N/F (MISCELLANEOUS) IMPLANT
KIT MARKER MARGIN INK (KITS) ×2 IMPLANT
LIGHT WAVEGUIDE WIDE FLAT (MISCELLANEOUS) IMPLANT
NDL HYPO 25X1 1.5 SAFETY (NEEDLE) ×1 IMPLANT
NEEDLE HYPO 25X1 1.5 SAFETY (NEEDLE) ×2 IMPLANT
NS IRRIG 1000ML POUR BTL (IV SOLUTION) IMPLANT
PACK BASIN DAY SURGERY FS (CUSTOM PROCEDURE TRAY) ×2 IMPLANT
PENCIL BUTTON HOLSTER BLD 10FT (ELECTRODE) ×2 IMPLANT
SLEEVE SCD COMPRESS KNEE MED (MISCELLANEOUS) ×2 IMPLANT
SPONGE LAP 4X18 X RAY DECT (DISPOSABLE) ×2 IMPLANT
STRIP CLOSURE SKIN 1/2X4 (GAUZE/BANDAGES/DRESSINGS) ×2 IMPLANT
SUT MNCRL AB 4-0 PS2 18 (SUTURE) IMPLANT
SUT MON AB 5-0 PS2 18 (SUTURE) ×2 IMPLANT
SUT SILK 2 0 SH (SUTURE) IMPLANT
SUT VIC AB 2-0 SH 27 (SUTURE) ×2
SUT VIC AB 2-0 SH 27XBRD (SUTURE) ×1 IMPLANT
SUT VIC AB 3-0 SH 27 (SUTURE) ×2
SUT VIC AB 3-0 SH 27X BRD (SUTURE) ×1 IMPLANT
SYR CONTROL 10ML LL (SYRINGE) ×2 IMPLANT
TOWEL OR 17X24 6PK STRL BLUE (TOWEL DISPOSABLE) ×2 IMPLANT
TOWEL OR NON WOVEN STRL DISP B (DISPOSABLE) ×1 IMPLANT
TUBE CONNECTING 20X1/4 (TUBING) IMPLANT
YANKAUER SUCT BULB TIP NO VENT (SUCTIONS) IMPLANT

## 2016-12-30 NOTE — H&P (Signed)
Stephanie [name] is an 51 y.o. female.   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 she had core biopsy of lesion in left breast last year that was atypical apocrine hyperplasia and this was excised. this was a radial scar. she had no symptoms this year and presented for screening mm. she has no fh and no prior history before last year. she had c density breasts and the had new left breast calcs. she has no right breast mass. she has a 1x1.7 cm area of retroareolar calcifications on the left breast. this underwent core biopsy and has a csl and alh. she is here to discuss options.    Past Medical History:  Diagnosis Date  . Anxiety   . Breast mass, left   . Complication of anesthesia    felt lethargic x 4 days following last surgery  . Depression   . Headache   . Osteopenia     Past Surgical History:  Procedure Laterality Date  . BREAST BIOPSY Left 07/10/2015   high risk stereo  . BREAST EXCISIONAL BIOPSY Left 08/15/2015   ADH  . BREAST LUMPECTOMY WITH RADIOACTIVE SEED LOCALIZATION Left 08/15/2015   Procedure:  LEFT BREAST LUMPECTOMY WITH RADIOACTIVE SEED LOCALIZATION;  Surgeon: Erroll Luna, MD;  Location: [facility];  Service: General;  Laterality: Left;  . TONSILLECTOMY  Age 37  . TRIGGER FINGER RELEASE  2010   Right Thumb    Family History  Problem Relation Age of Onset  . Depression Brother   . Hypertension Mother   . Osteoporosis Mother   . ADD / ADHD Son   . ADD / ADHD Daughter    Social History:  reports that she has never smoked. She has never used smokeless tobacco. She reports that she does not drink alcohol or use drugs.  Allergies:  Allergies  Allergen Reactions  . Wellbutrin Xl [Bupropion] Hives    Medications Prior to Admission  Medication Sig Dispense Refill  . alendronate (FOSAMAX) 70 MG tablet Take 1 tablet (70 mg total) by mouth once a  week. Take with a full glass of water on an empty stomach. 12 tablet 3  . BIOTIN PO Take by mouth. [employer] brand HairSkinNails supplement; contains Ca and other nutrients.    . Calcium Citrate-Vitamin D (CALCIUM CITRATE + D PO) Take by mouth.    . Diclofenac Sodium (PENNSAID) 2 % SOLN Place 2 application onto the skin 2 (two) times daily. 112 g 3  . FLUoxetine (PROZAC) 10 MG capsule Take 1 capsule (10 mg total) by mouth daily. 90 capsule 3  . Multiple Minerals-Vitamins (CAL-MAG-ZINC-D) TABS Take by mouth.      No results found for this or any previous visit (from the past 48 hour(s)). Mm Lt Radioactive Seed Loc Mammo Guide  Result Date: 12/29/2016 CLINICAL DATA:  Patient presents for seed localization prior to excision of atypical lobular hyperplasia and complex sclerosing lesion in the left breast. EXAM: MAMMOGRAPHIC GUIDED RADIOACTIVE SEED LOCALIZATION OF THE LEFT BREAST COMPARISON:  Previous exam(s). FINDINGS: Patient presents for radioactive seed localization prior to excision. I met with the patient and we discussed the procedure of seed localization including benefits and alternatives. We discussed the high likelihood of a successful procedure. We discussed the risks of the procedure including infection, bleeding, tissue injury and further surgery. We discussed the low dose of radioactivity involved in the procedure. Informed, written  consent was given. The usual time-out protocol was performed immediately prior to the procedure. Using mammographic guidance, sterile technique, 1% lidocaine and an I-125 radioactive seed, a coil shaped clip in the upper-outer quadrant of the left breast was localized using a superior approach. The follow-up mammogram images confirm the seed in the expected location and were marked for Dr. Donne Hazel. Follow-up survey of the patient confirms presence of the radioactive seed. Order number of I-125 seed:  [id number]. Total activity:  6.606 millicuries  Reference  Date: 12/21/2016 The patient tolerated the procedure well and was released from the [facility]. She was given instructions regarding seed removal. IMPRESSION: Radioactive seed localization of the left breast. No apparent complications. Electronically Signed   By: Nolon Nations M.D.   On: 12/29/2016 16:51    ROS Negative  Blood pressure 112/67, pulse 74, temperature 98.7 F (37.1 C), temperature source Oral, resp. rate 18, height _0  (1.6 m), weight 68.5 kg (151 lb), last menstrual period 01/25/2014, SpO2 100 %. Physical Exam  Vitals (Tanisha A. Brown RMA; 12/10/2016 9:24 AM) 12/10/2016 9:24 AM Weight: 149.8 lb Height: 63in Body Surface Area: 1.71 m Body Mass Index: 26.54 kg/m  Temp.: 97.72F  Pulse: 80 (Regular)  BP: 116/78 (Sitting, Left Arm, Standard) Physical Exam [name] MD; 12/10/2016 9:59 AM) General Mental Status-Alert. Orientation-Oriented X3. Head and Neck Trachea-midline. Thyroid Gland Characteristics - normal size and consistency. Chest and Lung Exam Chest and lung exam reveals -quiet, even and easy respiratory effort with no use of accessory muscles and on auscultation, normal breath sounds, no adventitious sounds and normal vocal resonance. Breast Nipples-No Discharge. Breast Lump-No Palpable Breast Mass. Cardiovascular Cardiovascular examination reveals -normal heart sounds, regular rate and rhythm with no murmurs. Lymphatic Head & Neck General Head & Neck Lymphatics: Bilateral - Description - Normal. Axillary General Axillary Region: Bilateral - Description - Normal. Note: no [place] adenopathy   Assessment & Plan [name] MD; 12/10/2016 10:01 AM) BREAST CALCIFICATION, LEFT (R92.1) Story: Left breast seed guided excisional biopsy I think with alh and a csl it is best to excise this area. we discussed small risk of upgrade and what that might mean. will plan on seed placement and excision   Jasin Brazel,  MD 12/30/2016, 10:19 AM

## 2016-12-30 NOTE — Anesthesia Postprocedure Evaluation (Signed)
Anesthesia Post Note  Patient: Stephanie [name]  Procedure(s) Performed: Procedure(s) (LRB): RADIOACTIVE SEED GUIDED EXCISIONAL LEFT BREAST BIOPSY (Left)     Patient location during evaluation: PACU Anesthesia Type: General Level of consciousness: sedated and patient cooperative Pain management: pain level controlled Vital Signs Assessment: post-procedure vital signs reviewed and stable Respiratory status: spontaneous breathing Cardiovascular status: stable Anesthetic complications: no    Last Vitals:  Vitals:   12/30/16 1145 12/30/16 1200  BP: 97/64 106/68  Pulse: 72 74  Resp: 19 15  Temp:    SpO2: 100% 99%    Last Pain:  Vitals:   12/30/16 1200  TempSrc:   PainSc: 0-No pain                 Nolon Nations

## 2016-12-30 NOTE — Op Note (Addendum)
Preoperative diagnoses: Left breast mammographic lesion  Postoperative diagnosis: Same as above Procedure: Left breast seed guided lumpectomy Surgeon: Dr. Serita Grammes Anesthesia: Gen. Estimated blood loss: minimal Complications: None Drains: None Specimens: Left breast tissue marked with paint Sponge and needle count correct at completion Disposition to recovery stable  Indications: This is a 27 yof with a lesionnoted on screening mammogram. The core biopsy is benign and lesion is recommended for excision. She had this about a year ago as well. We discussed options and have elected to excise this with seed guidance.   Procedure: After informed consent was obtained she was then taken to the operating room. She was given ancef. Sequential compression devices were on her legs. She was placed under general anesthesia without complication. Her chestwas then prepped and draped in the standard sterile surgical fashion. A surgical timeout was then performed. The seed was in the central left breast. I infiltrated marcaine and made a periareolar incision to hide the scar. this was same incision used last year. I then used the neoprobe to guide excision of the seedand surrounding tissue.  Mammogram confirmed removal of seed and the clip. This was then all sent to pathology. Hemostasis was observed.  I closed the breast tissue with a 2-0 Vicryl. The dermis was closed with 3-0 Vicryl and the skin with 5-0 Monocryl.Dermabond and steristrips were placed on the incision. A breast binder was placed. She was transferred to recovery stable

## 2016-12-30 NOTE — Transfer of Care (Signed)
Immediate Anesthesia Transfer of Care Note  Patient: Stephanie [name]  Procedure(s) Performed: Procedure(s): RADIOACTIVE SEED GUIDED EXCISIONAL LEFT BREAST BIOPSY (Left)  Patient Location: PACU  Anesthesia Type:General  Level of Consciousness: sedated  Airway & Oxygen Therapy: Patient Spontanous Breathing and Patient connected to face mask oxygen  Post-op Assessment: Report given to RN and Post -op Vital signs reviewed and stable  Post vital signs: Reviewed and stable  Last Vitals:  Vitals:   12/30/16 0949  BP: 112/67  Pulse: 74  Resp: 18  Temp: 37.1 C  SpO2: 100%    Last Pain:  Vitals:   12/30/16 0949  TempSrc: Oral      Patients Stated Pain Goal: 0 (09/73/53 2992)  Complications: No apparent anesthesia complications

## 2016-12-30 NOTE — Interval H&P Note (Signed)
History and Physical Interval Note:  12/30/2016 10:21 AM  Stephanie [name]  has presented today for surgery, with the diagnosis of LEFT BREAST CALCIFICAITONS  The various methods of treatment have been discussed with the patient and family. After consideration of risks, benefits and other options for treatment, the patient has consented to  Procedure(s): RADIOACTIVE SEED GUIDED EXCISIONAL LEFT BREAST BIOPSY (Left) as a surgical intervention .  The patient's history has been reviewed, patient examined, no change in status, stable for surgery.  I have reviewed the patient's chart and labs.  Questions were answered to the patient's satisfaction.     [name]

## 2016-12-30 NOTE — Discharge Instructions (Signed)
[facility] °Office Phone Number [phone] ° °POST OP INSTRUCTIONS ° °Always review your discharge instruction sheet given to you by the facility where your surgery was performed. ° °IF YOU HAVE DISABILITY OR FAMILY LEAVE FORMS, YOU MUST BRING THEM TO THE OFFICE FOR PROCESSING.  DO NOT GIVE THEM TO YOUR DOCTOR. ° °1. A prescription for pain medication may be given to you upon discharge.  Take your pain medication as prescribed, if needed.  If narcotic pain medicine is not needed, then you may take acetaminophen (Tylenol), naprosyn (Alleve) or ibuprofen (Advil) as needed. °2. Take your usually prescribed medications unless otherwise directed °3. If you need a refill on your pain medication, please contact your pharmacy.  They will contact our office to request authorization.  Prescriptions will not be filled after 5pm or on week-ends. °4. You should eat very light the first 24 hours after surgery, such as soup, crackers, pudding, etc.  Resume your normal diet the day after surgery. °5. Most patients will experience some swelling and bruising in the breast.  Ice packs and a good support bra will help.  Wear the breast binder provided or a sports bra for 72 hours day and night.  After that wear a sports bra during the day until you return to the office. Swelling and bruising can take several days to resolve.  °6. It is common to experience some constipation if taking pain medication after surgery.  Increasing fluid intake and taking a stool softener will usually help or prevent this problem from occurring.  A mild laxative (Milk of Magnesia or Miralax) should be taken according to package directions if there are no bowel movements after 48 hours. °7. Unless discharge instructions indicate otherwise, you may remove your bandages 48 hours after surgery and you may shower at that time.  You may have steri-strips (small skin tapes) in place directly over the incision.  These strips should be left on the  skin for 7-10 days and will come off on their own.  If your surgeon used skin glue on the incision, you may shower in 24 hours.  The glue will flake off over the next 2-3 weeks.  Any sutures or staples will be removed at the office during your follow-up visit. °8. ACTIVITIES:  You may resume regular daily activities (gradually increasing) beginning the next day.  Wearing a good support bra or sports bra minimizes pain and swelling.  You may have sexual intercourse when it is comfortable. °a. You may drive when you no longer are taking prescription pain medication, you can comfortably wear a seatbelt, and you can safely maneuver your car and apply brakes. °b. RETURN TO WORK:  ______________________________________________________________________________________ °9. You should see your doctor in the office for a follow-up appointment approximately two weeks after your surgery.  Your doctor’s nurse will typically make your follow-up appointment when she calls you with your pathology report.  Expect your pathology report 3-4 business days after your surgery.  You may call to check if you do not hear from us after three days. °10. OTHER INSTRUCTIONS: _______________________________________________________________________________________________ _____________________________________________________________________________________________________________________________________ °_____________________________________________________________________________________________________________________________________ °_____________________________________________________________________________________________________________________________________ ° °WHEN TO CALL DR Mattalynn Crandle: °1. Fever over 101.0 °2. Nausea and/or vomiting. °3. Extreme swelling or bruising. °4. Continued bleeding from incision. °5. Increased pain, redness, or drainage from the incision. ° °The clinic staff is available to answer your questions during regular  business hours.  Please don’t hesitate to call and ask to speak to one of the nurses for clinical concerns.  If   you have a medical emergency, go to the nearest emergency room or call 911.  A surgeon from [facility] is always on call at the hospital. ° °For further questions, please visit [email or web address] ° ° ° ° ° °Post Anesthesia Home Care Instructions ° °Activity: °Get plenty of rest for the remainder of the day. A responsible individual must stay with you for 24 hours following the procedure.  °For the next 24 hours, DO NOT: °-Drive a car °-Operate machinery °-Drink alcoholic beverages °-Take any medication unless instructed by your physician °-Make any legal decisions or sign important papers. ° °Meals: °Start with liquid foods such as gelatin or soup. Progress to regular foods as tolerated. Avoid greasy, spicy, heavy foods. If nausea and/or vomiting occur, drink only clear liquids until the nausea and/or vomiting subsides. Call your physician if vomiting continues. ° °Special Instructions/Symptoms: °Your throat may feel dry or sore from the anesthesia or the breathing tube placed in your throat during surgery. If this causes discomfort, gargle with warm salt water. The discomfort should disappear within 24 hours. ° °If you had a scopolamine patch placed behind your ear for the management of post- operative nausea and/or vomiting: ° °1. The medication in the patch is effective for 72 hours, after which it should be removed.  Wrap patch in a tissue and discard in the trash. Wash hands thoroughly with soap and water. °2. You may remove the patch earlier than 72 hours if you experience unpleasant side effects which may include dry mouth, dizziness or visual disturbances. °3. Avoid touching the patch. Wash your hands with soap and water after contact with the patch. °  ° °

## 2016-12-30 NOTE — Anesthesia Procedure Notes (Signed)
Procedure Name: LMA Insertion Date/Time: 12/30/2016 10:46 AM Performed by: Maryella Shivers Pre-anesthesia Checklist: Patient identified, Emergency Drugs available, Suction available and Patient being monitored Patient Re-evaluated:Patient Re-evaluated prior to induction Oxygen Delivery Method: Circle system utilized Preoxygenation: Pre-oxygenation with 100% oxygen Induction Type: IV induction Ventilation: Mask ventilation without difficulty LMA: LMA inserted LMA Size: 4.0 Number of attempts: 1 Airway Equipment and Method: Bite block Placement Confirmation: positive ETCO2 Tube secured with: Tape Dental Injury: Teeth and Oropharynx as per pre-operative assessment

## 2016-12-30 NOTE — Anesthesia Preprocedure Evaluation (Signed)
Anesthesia Evaluation  Patient identified by MRN, date of birth, ID band Patient awake    Reviewed: Allergy & Precautions, NPO status , Patient's Chart, lab work & pertinent test results  Airway Mallampati: II  TM Distance: >3 FB Neck ROM: Full    Dental no notable dental hx.    Pulmonary neg pulmonary ROS,    Pulmonary exam normal breath sounds clear to auscultation       Cardiovascular negative cardio ROS Normal cardiovascular exam Rhythm:Regular Rate:Normal     Neuro/Psych  Headaches, PSYCHIATRIC DISORDERS Anxiety Depression    GI/Hepatic negative GI ROS, Neg liver ROS,   Endo/Other  negative endocrine ROS  Renal/GU negative Renal ROS     Musculoskeletal negative musculoskeletal ROS (+)   Abdominal   Peds  Hematology negative hematology ROS (+)   Anesthesia Other Findings   Reproductive/Obstetrics negative OB ROS                             Anesthesia Physical  Anesthesia Plan  ASA: II  Anesthesia Plan: General   Post-op Pain Management:    Induction: Intravenous  PONV Risk Score and Plan: 3 and Ondansetron, Dexamethasone, Midazolam and Scopolamine patch - Pre-op  Airway Management Planned: LMA  Additional Equipment:   Intra-op Plan:   Post-operative Plan: Extubation in OR  Informed Consent: I have reviewed the patients History and Physical, chart, labs and discussed the procedure including the risks, benefits and alternatives for the proposed anesthesia with the patient or authorized representative who has indicated his/her understanding and acceptance.   Dental advisory given  Plan Discussed with: CRNA  Anesthesia Plan Comments:         Anesthesia Quick Evaluation

## 2016-12-31 ENCOUNTER — Encounter (HOSPITAL_BASED_OUTPATIENT_CLINIC_OR_DEPARTMENT_OTHER): Payer: Self-pay | Admitting: General Surgery

## 2017-02-01 ENCOUNTER — Encounter: Payer: Self-pay | Admitting: Family Medicine

## 2017-02-01 ENCOUNTER — Telehealth: Payer: Self-pay | Admitting: Family Medicine

## 2017-02-01 NOTE — Telephone Encounter (Signed)
Left patient vm to call back.  Patient has credit of 85.00.  After speaking with Billing.  Patient can either move this credit from our department to the [facility] or they can leave it as a credit  until hospital bill is paid.  Would like to know which patient would like to do.

## 2017-02-09 ENCOUNTER — Ambulatory Visit: Payer: Self-pay | Admitting: Family Medicine

## 2017-02-11 ENCOUNTER — Ambulatory Visit (INDEPENDENT_AMBULATORY_CARE_PROVIDER_SITE_OTHER): Payer: 59 | Admitting: Family Medicine

## 2017-02-11 ENCOUNTER — Encounter: Payer: Self-pay | Admitting: Family Medicine

## 2017-02-11 DIAGNOSIS — M810 Age-related osteoporosis without current pathological fracture: Secondary | ICD-10-CM

## 2017-02-11 DIAGNOSIS — M818 Other osteoporosis without current pathological fracture: Secondary | ICD-10-CM | POA: Diagnosis not present

## 2017-02-11 NOTE — Progress Notes (Signed)
Medical Nutrition Therapy:  Appt start time: 4098 end time:  1630.  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  Stephanie [name]) has restarted using [employer] points, frustrated that her weight has continued to increase.  Although she has not seen a weight loss since getting back to tracking [employer] points, with closer scrutiny to what she is eating, she has started to determine some hidden sources of fat in some of her foods.  She is aiming for no more than 23 [employer] points per day.  Yesterday's intake included no veg's, and I have the impression that Stephanie is often getting veg's <1 X day.  Most cooked veg's are canned.    Stephanie has reduced her daily diet Cokes to only 0-1 per week, and she is using 2 (vs. 3) pkts of Splenda in her coffee.  Recent physical activity includes 30 min personal trainer (mostly weight training) 1 X wk, 30 min run/walk 2-3 X wk, and 20-30 min walking on non-running days.  Stephanie is usually getting some kind of exercise M-F, but no exercise, on weekends.    24-hr recall:  (Up at 7:45 AM) B (9:30 AM)-  1/2 c blueberries, 1/4 c granola, 8 oz nonfat plain yogurt, coffee, 2 pkts Splenda, 3 tbsp creamer Snk ( AM)-  --- L (12:45 PM)-  1 grilled chx brst, 1 bun, 1 slice cheese, water 1:19 PM-  30 min personal trainer; water Snk (5 PM)-  1 tbsp choc-covered raisins D (7 PM)-  1 grilled chx brst, 1 c new potatoes, 1/2 c corn, water Snk ( PM)-  --- Typical day? Yes.    Progress Towards Goal(s):  In progress.   Nutritional Diagnosis:  Some progress noted on NI-5.11.1 Predicted suboptimal nutrient intake As related to bone health (protein and energy).  As evidenced by consistent meaningful protein intake at all 3 meals daily.    Intervention: Nutrition education.   Handouts given during visit include:  AVS  Goals Sheet  Barriers to learning/adherence to lifestyle change: Hesitation to loosen tight control over kcal intake b/c of  fear  of weight gain.   Monitoring/Evaluation:  Dietary intake, exercise, and body weight prn.

## 2017-02-11 NOTE — Patient Instructions (Addendum)
-   Vegetables:  Fresh or frozen are the healthiest.  Keep in mind that taste preferences are learned.    Recommendations: 1. Continue to track [employer] points (and include some points for foods you know actually have calories).  2. Get some form of veg's 2 X day.   3. Get a serving of fruit 1-2 X day.   4. In addition to strength training with the personal trainer, add back at least 1 DB workout at home (on the weekend, outside or garage?).  5. Weekends:  Do some advance planning to make best food choices.  Work in some exercise at least once during the weekend.   6. Continue to decrease artificial sweetener use as you can.    Call or email if you want to reschedule, or if you have Qs.

## 2017-02-15 MED FILL — FLUoxetine HCL 10 MG CAPS: 10 | 90 days supply | Qty: 90 | Fill #1

## 2017-02-15 MED FILL — ALENDRONATE NA 70 MG TAB: 70 | 84 days supply | Qty: 12 | Fill #1

## 2017-03-26 ENCOUNTER — Encounter: Payer: Self-pay | Admitting: Family Medicine

## 2017-06-02 MED FILL — ALENDRONATE NA 70 MG TAB: 70 | 84 days supply | Qty: 12 | Fill #2

## 2017-06-02 MED FILL — FLUoxetine HCL 10 MG CAPS: 10 | 90 days supply | Qty: 90 | Fill #2

## 2017-07-01 DIAGNOSIS — H524 Presbyopia: Secondary | ICD-10-CM | POA: Diagnosis not present

## 2017-07-01 DIAGNOSIS — H52223 Regular astigmatism, bilateral: Secondary | ICD-10-CM | POA: Diagnosis not present

## 2017-07-01 DIAGNOSIS — H5213 Myopia, bilateral: Secondary | ICD-10-CM | POA: Diagnosis not present

## 2017-07-01 DIAGNOSIS — H40053 Ocular hypertension, bilateral: Secondary | ICD-10-CM | POA: Diagnosis not present

## 2017-07-13 ENCOUNTER — Encounter: Payer: Self-pay | Admitting: Radiology

## 2017-08-17 ENCOUNTER — Encounter: Payer: Self-pay | Admitting: Family Medicine

## 2017-08-18 ENCOUNTER — Other Ambulatory Visit: Payer: Self-pay | Admitting: *Deleted

## 2017-08-18 DIAGNOSIS — M8588 Other specified disorders of bone density and structure, other site: Secondary | ICD-10-CM

## 2017-08-18 DIAGNOSIS — Z0000 Encounter for general adult medical examination without abnormal findings: Secondary | ICD-10-CM

## 2017-08-18 DIAGNOSIS — R5383 Other fatigue: Secondary | ICD-10-CM

## 2017-08-18 DIAGNOSIS — Z1231 Encounter for screening mammogram for malignant neoplasm of breast: Secondary | ICD-10-CM

## 2017-08-18 DIAGNOSIS — M810 Age-related osteoporosis without current pathological fracture: Secondary | ICD-10-CM

## 2017-08-18 DIAGNOSIS — M818 Other osteoporosis without current pathological fracture: Secondary | ICD-10-CM

## 2017-09-01 MED FILL — ALENDRONATE NA 70 MG TAB: 70 | 84 days supply | Qty: 12 | Fill #3

## 2017-09-06 ENCOUNTER — Ambulatory Visit (INDEPENDENT_AMBULATORY_CARE_PROVIDER_SITE_OTHER): Payer: 59 | Admitting: Family Medicine

## 2017-09-06 ENCOUNTER — Encounter: Payer: Self-pay | Admitting: Family Medicine

## 2017-09-06 VITALS — BP 111/71 | HR 76 | Ht 63.0 in | Wt 154.0 lb

## 2017-09-06 DIAGNOSIS — Z1151 Encounter for screening for human papillomavirus (HPV): Secondary | ICD-10-CM | POA: Diagnosis not present

## 2017-09-06 DIAGNOSIS — Z01419 Encounter for gynecological examination (general) (routine) without abnormal findings: Secondary | ICD-10-CM

## 2017-09-06 DIAGNOSIS — Z124 Encounter for screening for malignant neoplasm of cervix: Secondary | ICD-10-CM | POA: Diagnosis not present

## 2017-09-06 DIAGNOSIS — M818 Other osteoporosis without current pathological fracture: Secondary | ICD-10-CM

## 2017-09-06 DIAGNOSIS — M810 Age-related osteoporosis without current pathological fracture: Secondary | ICD-10-CM

## 2017-09-06 DIAGNOSIS — F411 Generalized anxiety disorder: Secondary | ICD-10-CM

## 2017-09-06 MED ORDER — SERTRALINE HCL 50 MG PO TABS: 25.0000 mg | ORAL_TABLET | Freq: Every day | ORAL | 3 refills | Status: DC

## 2017-09-06 NOTE — Patient Instructions (Signed)
Preventive Care 40-64 Years, Female Preventive care refers to lifestyle choices and visits with your health care provider that can promote health and wellness. What does preventive care include?  A yearly physical exam. This is also called an annual well check.  Dental exams once or twice a year.  Routine eye exams. Ask your health care provider how often you should have your eyes checked.  Personal lifestyle choices, including: ? Daily care of your teeth and gums. ? Regular physical activity. ? Eating a healthy diet. ? Avoiding tobacco and drug use. ? Limiting alcohol use. ? Practicing safe sex. ? Taking low-dose aspirin daily starting at age 58. ? Taking vitamin and mineral supplements as recommended by your health care provider. What happens during an annual well check? The services and screenings done by your health care provider during your annual well check will depend on your age, overall health, lifestyle risk factors, and family history of disease. Counseling Your health care provider may ask you questions about your:  Alcohol use.  Tobacco use.  Drug use.  Emotional well-being.  Home and relationship well-being.  Sexual activity.  Eating habits.  Work and work Statistician.  Method of birth control.  Menstrual cycle.  Pregnancy history.  Screening You may have the following tests or measurements:  Height, weight, and BMI.  Blood pressure.  Lipid and cholesterol levels. These may be checked every 5 years, or more frequently if you are over 81 years old.  Skin check.  Lung cancer screening. You may have this screening every year starting at age 78 if you have a 30-pack-year history of smoking and currently smoke or have quit within the past 15 years.  Fecal occult blood test (FOBT) of the stool. You may have this test every year starting at age 65.  Flexible sigmoidoscopy or colonoscopy. You may have a sigmoidoscopy every 5 years or a colonoscopy  every 10 years starting at age 30.  Hepatitis C blood test.  Hepatitis B blood test.  Sexually transmitted disease (STD) testing.  Diabetes screening. This is done by checking your blood sugar (glucose) after you have not eaten for a while (fasting). You may have this done every 1-3 years.  Mammogram. This may be done every 1-2 years. Talk to your health care provider about when you should start having regular mammograms. This may depend on whether you have a family history of breast cancer.  BRCA-related cancer screening. This may be done if you have a family history of breast, ovarian, tubal, or peritoneal cancers.  Pelvic exam and Pap test. This may be done every 3 years starting at age 80. Starting at age 36, this may be done every 5 years if you have a Pap test in combination with an HPV test.  Bone density scan. This is done to screen for osteoporosis. You may have this scan if you are at high risk for osteoporosis.  Discuss your test results, treatment options, and if necessary, the need for more tests with your health care provider. Vaccines Your health care provider may recommend certain vaccines, such as:  Influenza vaccine. This is recommended every year.  Tetanus, diphtheria, and acellular pertussis (Tdap, Td) vaccine. You may need a Td booster every 10 years.  Varicella vaccine. You may need this if you have not been vaccinated.  Zoster vaccine. You may need this after age 5.  Measles, mumps, and rubella (MMR) vaccine. You may need at least one dose of MMR if you were born in  1957 or later. You may also need a second dose.  Pneumococcal 13-valent conjugate (PCV13) vaccine. You may need this if you have certain conditions and were not previously vaccinated.  Pneumococcal polysaccharide (PPSV23) vaccine. You may need one or two doses if you smoke cigarettes or if you have certain conditions.  Meningococcal vaccine. You may need this if you have certain  conditions.  Hepatitis A vaccine. You may need this if you have certain conditions or if you travel or work in places where you may be exposed to hepatitis A.  Hepatitis B vaccine. You may need this if you have certain conditions or if you travel or work in places where you may be exposed to hepatitis B.  Haemophilus influenzae type b (Hib) vaccine. You may need this if you have certain conditions.  Talk to your health care provider about which screenings and vaccines you need and how often you need them. This information is not intended to replace advice given to you by your health care provider. Make sure you discuss any questions you have with your health care provider. Document Released: 05/10/2015 Document Revised: 01/01/2016 Document Reviewed: 02/12/2015 Elsevier Interactive Patient Education  2018 Elsevier Inc.  

## 2017-09-06 NOTE — Progress Notes (Signed)
Subjective:     Stephanie [name] is a 52 y.o. female and is here for a comprehensive physical exam. The patient reports problems - increasing stress lately. Would like to try Zoloft instead of Prozac.Marland Kitchen  Social History   Socioeconomic History  . Marital status: Married    Spouse name: Not on file  . Number of children: Not on file  . Years of education: Not on file  . Highest education level: Not on file  Occupational History  . Occupation: Programmer, multimedia: [employer]  . Financial resource strain: Not on file  . Food insecurity:    Worry: Not on file    Inability: Not on file  . Transportation needs:    Medical: Not on file    Non-medical: Not on file  Tobacco Use  . Smoking status: Never Smoker  . Smokeless tobacco: Never Used  Substance and Sexual Activity  . Alcohol use: No    Alcohol/week: 0.6 oz    Types: 1 Standard drinks or equivalent per week  . Drug use: No  . Sexual activity: Yes    Partners: Male    Birth control/protection: Post-menopausal  Lifestyle  . Physical activity:    Days per week: Not on file    Minutes per session: Not on file  . Stress: Not on file  Relationships  . Social connections:    Talks on phone: Not on file    Gets together: Not on file    Attends religious service: Not on file    Active member of club or organization: Not on file    Attends meetings of clubs or organizations: Not on file    Relationship status: Not on file  . Intimate partner violence:    Fear of current or ex partner: Not on file    Emotionally abused: Not on file    Physically abused: Not on file    Forced sexual activity: Not on file  Other Topics Concern  . Not on file  Social History Narrative   04/05/12 AM he was born and grew up in [facility] She has 3 older brothers. She never suffered any abuse. Her father died when she was 80 years of age. She completed her bachelor of science in nursing in 1989, and has been working as  an Therapist, sports . She has been married for 21 years, and she and her husband have a 43 year old son and a 48 year old daughter. She affiliates as Psychologist, forensic. She denies any legal involvement. She enjoys hiking and reading. She reports her social support consists of friends from college, and a friend from church. 04/05/2012   Health Maintenance  Topic Date Due  . HIV Screening  12/05/1980  . TETANUS/TDAP  12/05/1984  . COLONOSCOPY  12/06/2015  . INFLUENZA VACCINE  11/25/2017  . PAP SMEAR  02/24/2018  . MAMMOGRAM  10/28/2018    The following portions of the patient's history were reviewed and updated as appropriate: allergies, current medications, past family history, past medical history, past social history, past surgical history and problem list.  Review of Systems Pertinent items noted in HPI and remainder of comprehensive ROS otherwise negative.   Objective:    BP 111/71   Pulse 76   Ht 5\' 3"  (1.6 m)   Wt 154 lb (69.9 kg)   LMP 01/25/2014 (Approximate)   BMI 27.28 kg/m    Physical Examination: General appearance - alert, well appearing, and in no distress  Mental  status - alert, oriented to person, place, and time Neck - supple, no significant adenopathy Chest - clear to auscultation, no wheezes, rales or rhonchi, symmetric air entry Heart - normal rate, regular rhythm, normal S1, S2, no murmurs, rubs, clicks or gallops Abdomen - soft, nontender, nondistended, no masses or organomegaly Breasts - breasts appear normal, no suspicious masses, no skin or nipple changes or axillary nodes, well healed scar on left breast with loss of tissue superiorly Pelvic - normal external genitalia, vulva, vagina, cervix, uterus and adnexa Neurological - alert, oriented, normal speech, no focal findings or movement disorder noted Musculoskeletal - no joint tenderness, deformity or swelling Extremities - peripheral pulses normal, no pedal edema, no clubbing or cyanosis Skin - normal coloration and turgor, no  rashes, no suspicious skin lesions noted  Assessment:    Healthy female exam.      Plan:      Problem List Items Addressed This Visit      Unprioritized   Osteoporosis of lumbar spine    Dexa has been ordered--will begin having this in [facility] system--previously at [facility].Tolerating Fosamax without difficulty.      Generalized anxiety disorder    Largely anxiety component--will transition off Prozac and onto Zoloft. Begin qod dosing of Prozac and after 1 week, alternate qod dosing with qod (alternating) Zoloft. Advised this is low dose and may need dosing increase to achieve desired therapeutic effect.      Relevant Medications   sertraline (ZOLOFT) 50 MG tablet    Other Visit Diagnoses    Well woman exam with routine gynecological exam    -  Primary   Relevant Orders   Cytology - PAP   Ambulatory referral to Gastroenterology   CBC (Completed)   TSH (Completed)   Hemoglobin A1c (Completed)   Comprehensive metabolic panel (Completed)   Lipid panel (Completed)   VITAMIN D 25 Hydroxy (Vit-D Deficiency, Fractures) (Completed)   Screening for malignant neoplasm of cervix         Return in 1 year (on 09/07/2018).  See After Visit Summary for Counseling Recommendations

## 2017-09-07 LAB — COMPREHENSIVE METABOLIC PANEL
A/G RATIO: 1.8 (ref 1.2–2.2)
ALK PHOS: 78 IU/L (ref 39–117)
ALT: 17 IU/L (ref 0–32)
AST: 31 IU/L (ref 0–40)
Albumin: 4.3 g/dL (ref 3.5–5.5)
BUN/Creatinine Ratio: 20 (ref 9–23)
BUN: 14 mg/dL (ref 6–24)
Bilirubin Total: <0.2 mg/dL (ref 0.0–1.2)
CALCIUM: 9.5 mg/dL (ref 8.7–10.2)
CO2: 21 mmol/L (ref 20–29)
Chloride: 99 mmol/L (ref 96–106)
Creatinine, Ser: 0.71 mg/dL (ref 0.57–1.00)
GFR calc Af Amer: 114 mL/min/{1.73_m2} (ref >59)
GFR, EST NON AFRICAN AMERICAN: 99 mL/min/{1.73_m2} (ref >59)
Globulin, Total: 2.4 g/dL (ref 1.5–4.5)
Glucose: 106 mg/dL — ABNORMAL HIGH (ref 65–99)
POTASSIUM: 4.2 mmol/L (ref 3.5–5.2)
Sodium: 140 mmol/L (ref 134–144)
Total Protein: 6.7 g/dL (ref 6.0–8.5)

## 2017-09-07 LAB — LIPID PANEL
CHOL/HDL RATIO: 3.6 ratio (ref 0.0–4.4)
CHOLESTEROL TOTAL: 233 mg/dL — AB (ref 100–199)
HDL: 64 mg/dL (ref >39)
LDL Calculated: 138 mg/dL — ABNORMAL HIGH (ref 0–99)
TRIGLYCERIDES: 156 mg/dL — AB (ref 0–149)
VLDL Cholesterol Cal: 31 mg/dL (ref 5–40)

## 2017-09-07 LAB — HEMOGLOBIN A1C
ESTIMATED AVERAGE GLUCOSE: 105 mg/dL
HEMOGLOBIN A1C: 5.3 % (ref 4.8–5.6)

## 2017-09-07 LAB — CBC
HEMOGLOBIN: 13.1 g/dL (ref 11.1–15.9)
Hematocrit: 38.8 % (ref 34.0–46.6)
MCH: 30.6 pg (ref 26.6–33.0)
MCHC: 33.8 g/dL (ref 31.5–35.7)
MCV: 91 fL (ref 79–97)
Platelets: 370 10*3/uL (ref 150–379)
RBC: 4.28 x10E6/uL (ref 3.77–5.28)
RDW: 12.5 % (ref 12.3–15.4)
WBC: 7.3 10*3/uL (ref 3.4–10.8)

## 2017-09-07 LAB — TSH: TSH: 2.43 u[IU]/mL (ref 0.450–4.500)

## 2017-09-07 LAB — VITAMIN D 25 HYDROXY (VIT D DEFICIENCY, FRACTURES): Vit D, 25-Hydroxy: 36.2 ng/mL (ref 30.0–100.0)

## 2017-09-07 NOTE — Assessment & Plan Note (Addendum)
Dexa has been ordered--will begin having this in [facility] system--previously at [facility].Tolerating Fosamax without difficulty.

## 2017-09-07 NOTE — Assessment & Plan Note (Signed)
Largely anxiety component--will transition off Prozac and onto Zoloft. Begin qod dosing of Prozac and after 1 week, alternate qod dosing with qod (alternating) Zoloft. Advised this is low dose and may need dosing increase to achieve desired therapeutic effect.

## 2017-09-08 LAB — CYTOLOGY - PAP
Diagnosis: NEGATIVE
HPV: NOT DETECTED

## 2017-09-16 MED FILL — SERTRALINE HCL 50 MG TABS: 50 | 90 days supply | Qty: 45 | Fill #0

## 2017-12-14 ENCOUNTER — Ambulatory Visit
Admission: RE | Admit: 2017-12-14 | Discharge: 2017-12-14 | Disposition: A | Discharge status: Home / Self Care | Payer: 59 | Source: Ambulatory Visit | Attending: Family Medicine | Admitting: Family Medicine

## 2017-12-14 DIAGNOSIS — Z1231 Encounter for screening mammogram for malignant neoplasm of breast: Secondary | ICD-10-CM

## 2017-12-14 DIAGNOSIS — Z780 Asymptomatic menopausal state: Secondary | ICD-10-CM | POA: Diagnosis not present

## 2017-12-14 DIAGNOSIS — M8589 Other specified disorders of bone density and structure, multiple sites: Secondary | ICD-10-CM | POA: Diagnosis not present

## 2017-12-14 DIAGNOSIS — M8588 Other specified disorders of bone density and structure, other site: Secondary | ICD-10-CM

## 2017-12-15 ENCOUNTER — Other Ambulatory Visit: Payer: Self-pay | Admitting: Family Medicine

## 2017-12-15 DIAGNOSIS — R921 Mammographic calcification found on diagnostic imaging of breast: Secondary | ICD-10-CM

## 2018-01-05 ENCOUNTER — Other Ambulatory Visit: Payer: Self-pay | Admitting: Family Medicine

## 2018-01-05 DIAGNOSIS — M818 Other osteoporosis without current pathological fracture: Principal | ICD-10-CM

## 2018-01-05 DIAGNOSIS — M810 Age-related osteoporosis without current pathological fracture: Secondary | ICD-10-CM

## 2018-01-05 MED FILL — SERTRALINE HCL 50 MG TABLET: 50 | 90 days supply | Qty: 45 | Fill #1

## 2018-01-18 ENCOUNTER — Other Ambulatory Visit: Payer: Self-pay | Admitting: Family Medicine

## 2018-01-18 DIAGNOSIS — F411 Generalized anxiety disorder: Secondary | ICD-10-CM

## 2018-01-18 MED ORDER — SERTRALINE HCL 50 MG PO TABS: 50.0000 mg | ORAL_TABLET | Freq: Every day | ORAL | 3 refills | Status: DC

## 2018-01-18 NOTE — Progress Notes (Signed)
Anxiety is worsening--asked for increase on zoloft--have doubled dose to 50 mg daily.

## 2018-01-26 MED FILL — ALENDRONATE NA 70 MG TAB: 70 | 84 days supply | Qty: 12 | Fill #0

## 2018-01-27 ENCOUNTER — Other Ambulatory Visit: Payer: Self-pay | Admitting: Family Medicine

## 2018-01-27 ENCOUNTER — Ambulatory Visit
Admission: RE | Admit: 2018-01-27 | Discharge: 2018-01-27 | Disposition: A | Discharge status: Home / Self Care | Payer: 59 | Source: Ambulatory Visit | Attending: Family Medicine | Admitting: Family Medicine

## 2018-01-27 DIAGNOSIS — R921 Mammographic calcification found on diagnostic imaging of breast: Secondary | ICD-10-CM

## 2018-01-28 IMAGING — MG MM CLIP PLACEMENT
2 series · 2 of 2 positions shown · non-contrast
Comparison: Previous exam(s).

CLINICAL DATA: Status post stereotactic core needle biopsy of left
breast calcifications.

EXAM:
DIAGNOSTIC LEFT MAMMOGRAM POST STEREOTACTIC BIOPSY

[L CC]
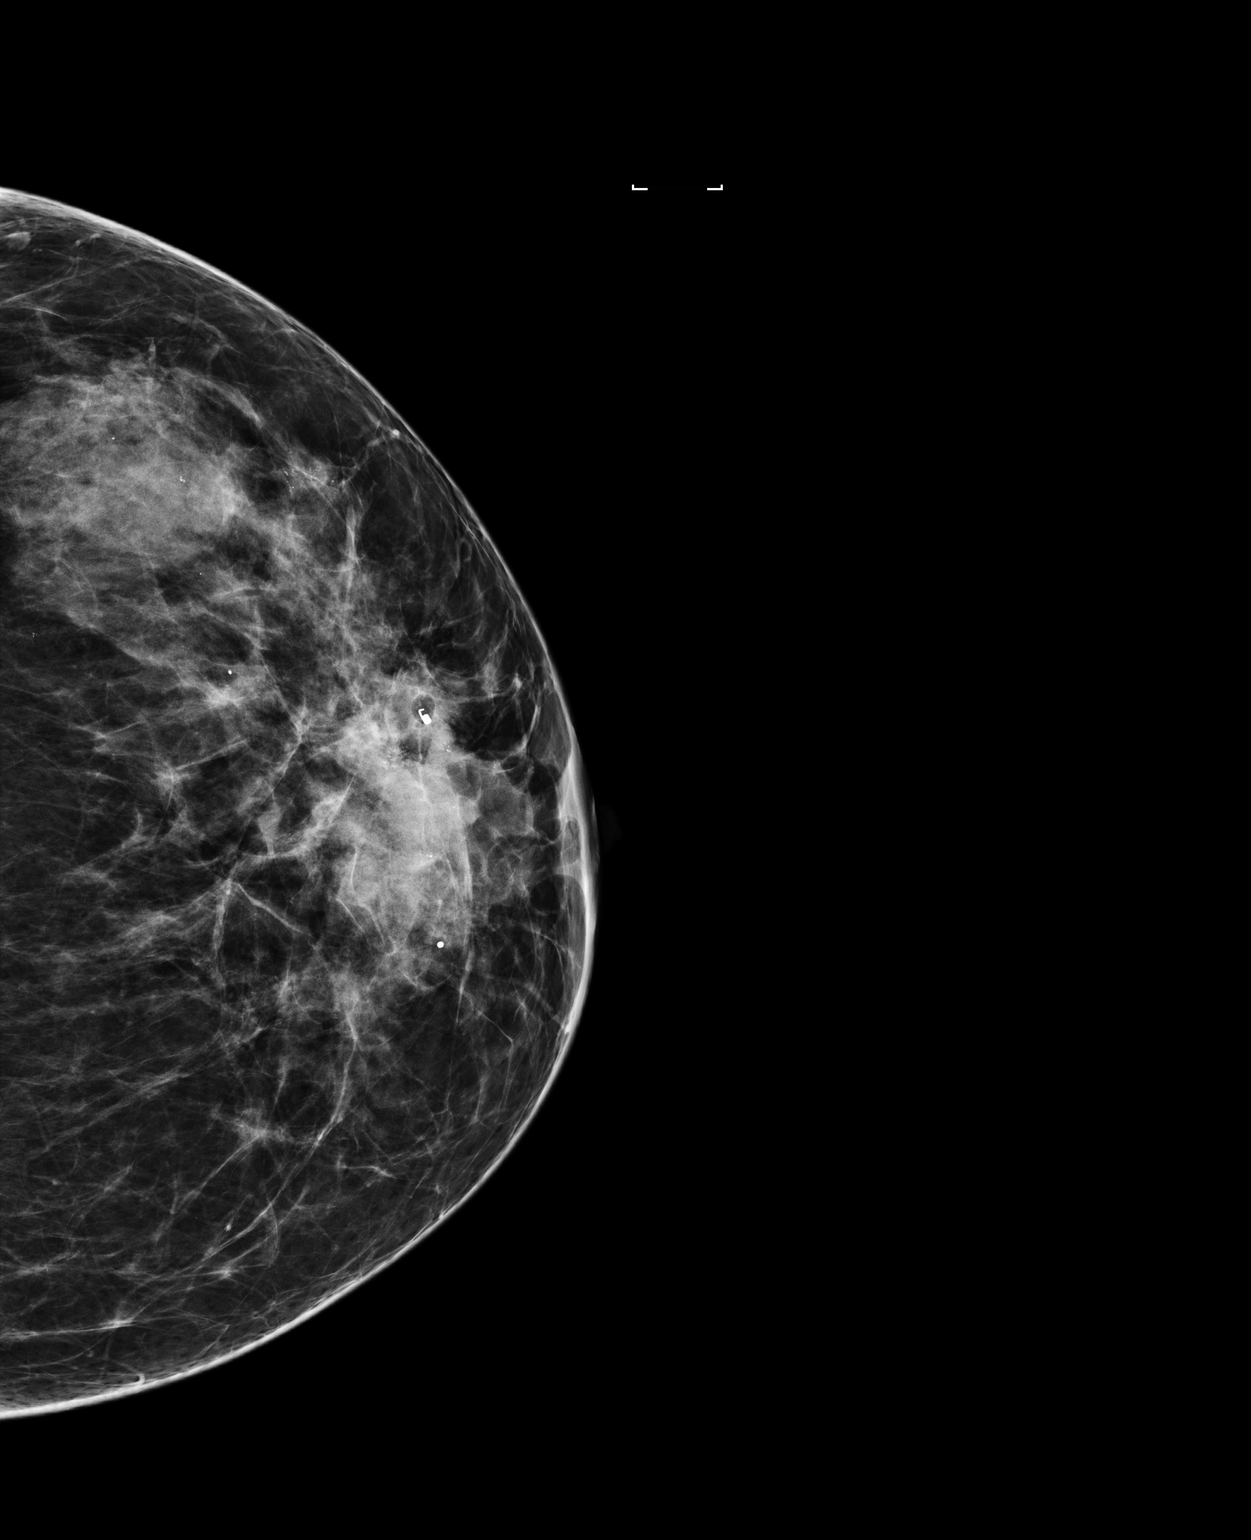

[L ML]
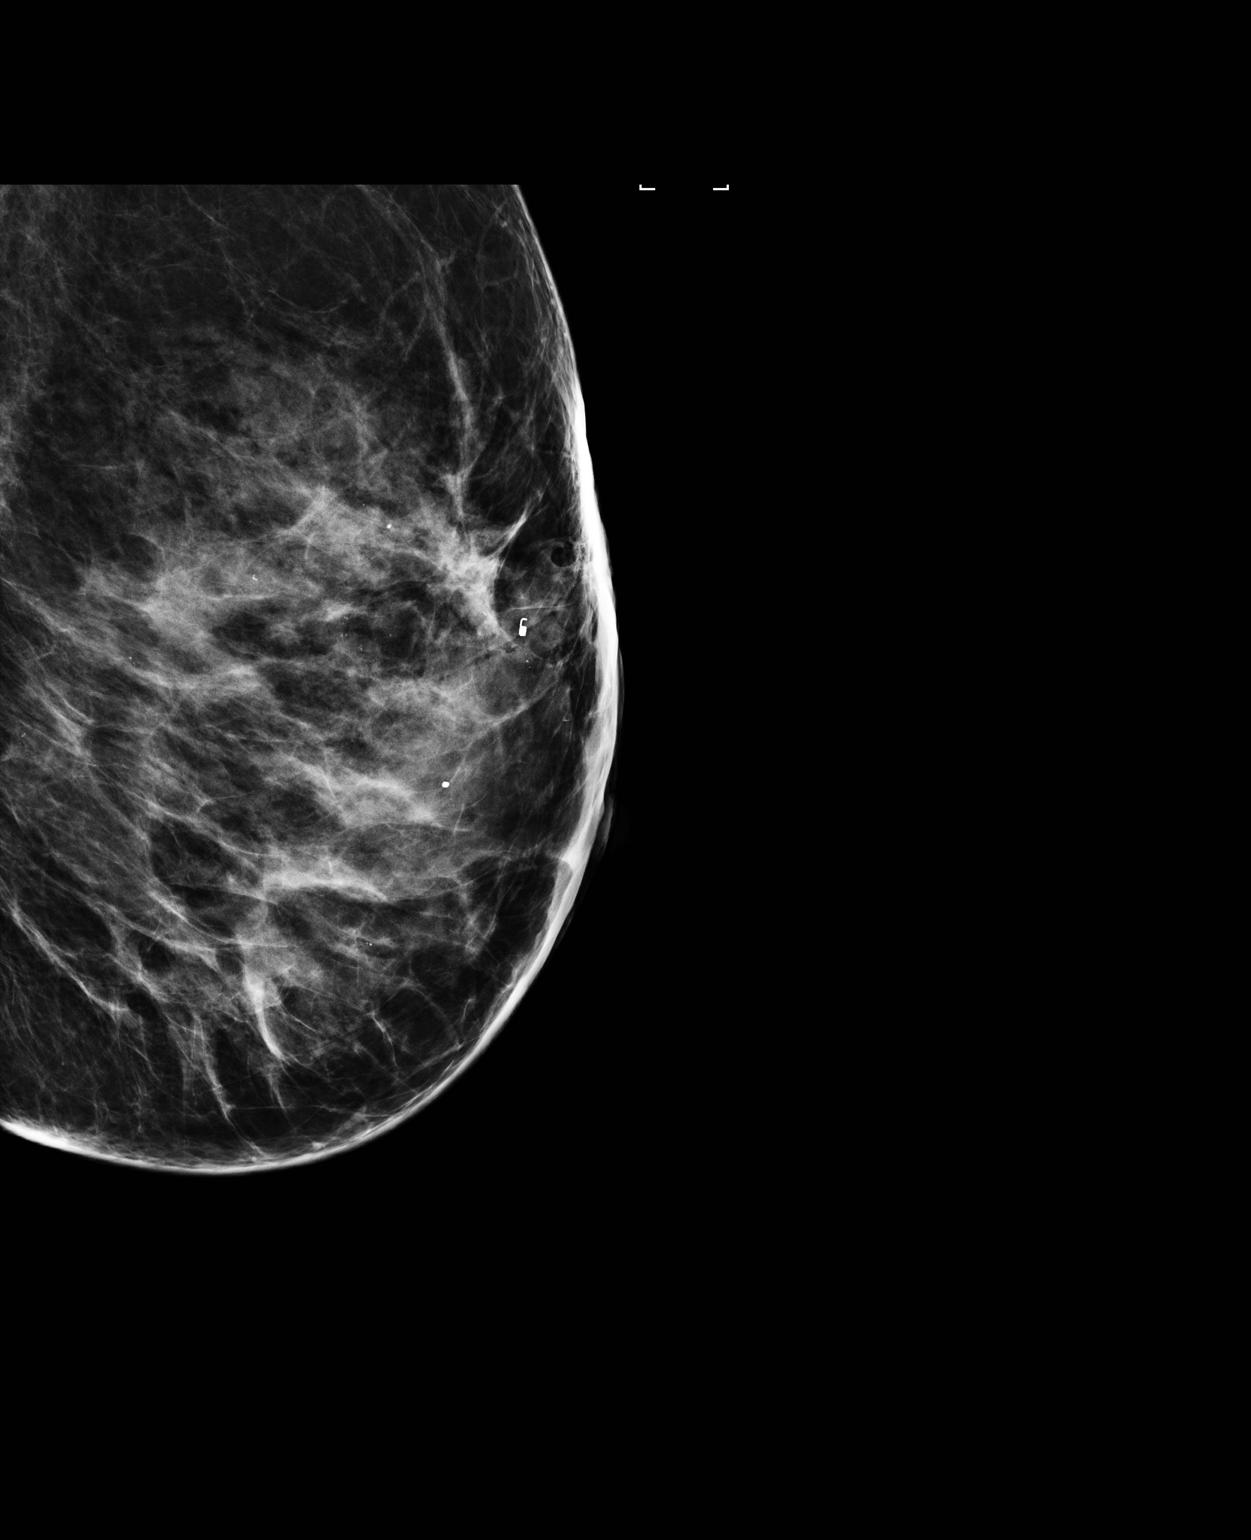

[2 of 2 positions shown; findings below may reference images not displayed]

FINDINGS: Mammographic images were obtained following stereotactic guided
biopsy of left breast calcifications. The coil shaped biopsy clip
lies in the anterior upper outer quadrant adjacent to residual
calcifications.
IMPRESSION: Well-positioned coil shaped biopsy clip following stereotactic core
needle biopsy of left breast calcifications.

Final Assessment: Post Procedure Mammograms for Marker Placement

## 2018-02-07 DIAGNOSIS — R509 Fever, unspecified: Secondary | ICD-10-CM | POA: Diagnosis not present

## 2018-02-07 DIAGNOSIS — J069 Acute upper respiratory infection, unspecified: Secondary | ICD-10-CM | POA: Diagnosis not present

## 2018-03-03 MED FILL — SERTRALINE HCL 50 MG TABLET: 50 | 90 days supply | Qty: 90 | Fill #0

## 2018-05-03 DIAGNOSIS — I889 Nonspecific lymphadenitis, unspecified: Secondary | ICD-10-CM | POA: Diagnosis not present

## 2018-05-26 DIAGNOSIS — M26621 Arthralgia of right temporomandibular joint: Secondary | ICD-10-CM | POA: Diagnosis not present

## 2018-05-26 DIAGNOSIS — M26629 Arthralgia of temporomandibular joint, unspecified side: Secondary | ICD-10-CM | POA: Insufficient documentation

## 2018-06-03 MED FILL — SERTRALINE HCL 50 MG TABLET: 50 | 90 days supply | Qty: 90 | Fill #1

## 2018-06-03 MED FILL — ALENDRONATE NA 70 MG TAB: 70 | 84 days supply | Qty: 12 | Fill #1

## 2018-07-05 ENCOUNTER — Encounter: Payer: Self-pay | Admitting: Radiology

## 2018-08-01 ENCOUNTER — Inpatient Hospital Stay: Admission: RE | Admit: 2018-08-01 | Payer: 59 | Source: Ambulatory Visit

## 2018-09-28 MED FILL — SERTRALINE HCL 50 MG TABS: 50 | 90 days supply | Qty: 90 | Fill #2

## 2018-09-29 ENCOUNTER — Other Ambulatory Visit: Payer: Self-pay

## 2018-09-29 ENCOUNTER — Ambulatory Visit
Admission: RE | Admit: 2018-09-29 | Discharge: 2018-09-29 | Disposition: A | Discharge status: Home / Self Care | Payer: 59 | Source: Ambulatory Visit | Attending: Family Medicine | Admitting: Family Medicine

## 2018-09-29 ENCOUNTER — Other Ambulatory Visit: Payer: Self-pay | Admitting: Family Medicine

## 2018-09-29 DIAGNOSIS — R921 Mammographic calcification found on diagnostic imaging of breast: Secondary | ICD-10-CM

## 2019-01-03 ENCOUNTER — Ambulatory Visit
Admission: RE | Admit: 2019-01-03 | Discharge: 2019-01-03 | Disposition: A | Discharge status: Home / Self Care | Payer: 59 | Source: Ambulatory Visit | Attending: Family Medicine | Admitting: Family Medicine

## 2019-01-03 ENCOUNTER — Other Ambulatory Visit: Payer: Self-pay | Admitting: Family Medicine

## 2019-01-03 ENCOUNTER — Other Ambulatory Visit: Payer: Self-pay

## 2019-01-03 DIAGNOSIS — R921 Mammographic calcification found on diagnostic imaging of breast: Secondary | ICD-10-CM

## 2019-01-06 ENCOUNTER — Other Ambulatory Visit: Payer: Self-pay

## 2019-01-06 ENCOUNTER — Ambulatory Visit
Admission: RE | Admit: 2019-01-06 | Discharge: 2019-01-06 | Disposition: A | Discharge status: Home / Self Care | Payer: 59 | Source: Ambulatory Visit | Attending: Family Medicine | Admitting: Family Medicine

## 2019-01-06 ENCOUNTER — Other Ambulatory Visit: Payer: Self-pay | Admitting: General Practice

## 2019-01-06 DIAGNOSIS — R921 Mammographic calcification found on diagnostic imaging of breast: Secondary | ICD-10-CM

## 2019-01-06 DIAGNOSIS — D0512 Intraductal carcinoma in situ of left breast: Secondary | ICD-10-CM | POA: Diagnosis not present

## 2019-01-13 ENCOUNTER — Inpatient Hospital Stay: Payer: 59 | Attending: Oncology

## 2019-01-13 ENCOUNTER — Other Ambulatory Visit: Payer: Self-pay | Admitting: Family Medicine

## 2019-01-13 ENCOUNTER — Other Ambulatory Visit: Payer: Self-pay | Admitting: General Surgery

## 2019-01-13 ENCOUNTER — Telehealth: Payer: Self-pay | Admitting: Licensed Clinical Social Worker

## 2019-01-13 ENCOUNTER — Other Ambulatory Visit: Payer: Self-pay

## 2019-01-13 ENCOUNTER — Telehealth: Payer: Self-pay | Admitting: *Deleted

## 2019-01-13 DIAGNOSIS — D0510 Intraductal carcinoma in situ of unspecified breast: Secondary | ICD-10-CM | POA: Diagnosis not present

## 2019-01-13 DIAGNOSIS — C50912 Malignant neoplasm of unspecified site of left female breast: Secondary | ICD-10-CM

## 2019-01-13 DIAGNOSIS — D0512 Intraductal carcinoma in situ of left breast: Secondary | ICD-10-CM | POA: Diagnosis not present

## 2019-01-13 DIAGNOSIS — R921 Mammographic calcification found on diagnostic imaging of breast: Secondary | ICD-10-CM

## 2019-01-13 NOTE — Telephone Encounter (Signed)
Called patient regarding 09/21 appointment, per patient's request lab appointment has been moved to 09/18 and 09/21 will be a webex appointment per Brianna's approval.

## 2019-01-13 NOTE — Telephone Encounter (Signed)
Received referral from Dr. Donne Hazel for urgent genetics. Scheduled and confirmed appt for 9/21 at 10am.

## 2019-01-13 NOTE — Telephone Encounter (Signed)
Called patient regarding upcoming Webex appointment, per patient's request this will be a walk-in visit. Keep labs.

## 2019-01-16 ENCOUNTER — Other Ambulatory Visit: Payer: 59

## 2019-01-16 ENCOUNTER — Encounter: Payer: Self-pay | Admitting: Licensed Clinical Social Worker

## 2019-01-16 ENCOUNTER — Inpatient Hospital Stay (HOSPITAL_BASED_OUTPATIENT_CLINIC_OR_DEPARTMENT_OTHER): Payer: 59 | Admitting: Licensed Clinical Social Worker

## 2019-01-16 DIAGNOSIS — Z1379 Encounter for other screening for genetic and chromosomal anomalies: Secondary | ICD-10-CM

## 2019-01-16 DIAGNOSIS — D0512 Intraductal carcinoma in situ of left breast: Secondary | ICD-10-CM

## 2019-01-16 DIAGNOSIS — Z170 Estrogen receptor positive status [ER+]: Secondary | ICD-10-CM | POA: Insufficient documentation

## 2019-01-16 DIAGNOSIS — C50412 Malignant neoplasm of upper-outer quadrant of left female breast: Secondary | ICD-10-CM | POA: Insufficient documentation

## 2019-01-16 NOTE — Progress Notes (Signed)
REFERRING PROVIDER: [name], MD [address] [place],  [place] [address]  PRIMARY PROVIDER:  [name], MD  PRIMARY REASON FOR VISIT:  1. Ductal carcinoma in situ (DCIS) of left breast    I connected with Stephanie [name] on 01/16/2019 at 9:55 AM EDT by Jackquline Denmark and verified that I am speaking with the correct person using two identifiers.    Patient location: work Provider location: clinic   HISTORY OF PRESENT ILLNESS:   Stephanie [name], a 53 y.o. female, was seen for a [facility] cancer genetics consultation at the request of Dr. Donne Hazel due to her recent diagnosis of DCIS.  Stephanie [name] presents to clinic today to discuss the possibility of a hereditary predisposition to cancer, genetic testing, and to further clarify her future cancer risks, as well as potential cancer risks for family members.   In 2020, at the age of 28, Stephanie [name] was diagnosed with DCIS of the left breast, ER/PR+. Patient reports she is deciding between lumpectomy and mastectomy and that genetics will help her decide.   CANCER HISTORY:  Oncology History   No history exists.     RISK FACTORS:  Menarche was at age 48.  First live birth at age 8.  OCP use for approximately 7 years.  Ovaries intact: yes.  Hysterectomy: no.  Menopausal status: postmenopausal.  HRT use: 1 years. Colonoscopy: no; not examined. Mammogram within the last year: yes. Number of breast biopsies: 3- lump at 39- [facility], lump at 32- [facility] Up to date with pelvic exams: yes. Any excessive radiation exposure in the past: no  Past Medical History:  Diagnosis Date  . Anxiety   . Breast mass, left   . Complication of anesthesia    felt lethargic x 4 days following last surgery  . Depression   . Headache   . Osteopenia     Past Surgical History:  Procedure Laterality Date  . BREAST BIOPSY Left 07/10/2015   high risk stereo  . BREAST EXCISIONAL BIOPSY Left 08/15/2015   ADH  . BREAST LUMPECTOMY WITH RADIOACTIVE SEED  LOCALIZATION Left 08/15/2015   Procedure:  LEFT BREAST LUMPECTOMY WITH RADIOACTIVE SEED LOCALIZATION;  Surgeon: Erroll Luna, MD;  Location: [facility];  Service: General;  Laterality: Left;  . EXCISION OF BREAST BIOPSY Left 12/30/2016   Benign ALH sclerosing lesion  . RADIOACTIVE SEED GUIDED EXCISIONAL BREAST BIOPSY Left 12/30/2016   Procedure: RADIOACTIVE SEED GUIDED EXCISIONAL LEFT BREAST BIOPSY;  Surgeon: [name], MD;  Location: [facility];  Service: General;  Laterality: Left;  . TONSILLECTOMY  Age 33  . TRIGGER FINGER RELEASE  2010   Right Thumb    Social History   Socioeconomic History  . Marital status: Married    Spouse name: Not on file  . Number of children: Not on file  . Years of education: Not on file  . Highest education level: Not on file  Occupational History  . Occupation: Programmer, multimedia: [employer]  . Financial resource strain: Not on file  . Food insecurity    Worry: Not on file    Inability: Not on file  . Transportation needs    Medical: Not on file    Non-medical: Not on file  Tobacco Use  . Smoking status: Never Smoker  . Smokeless tobacco: Never Used  Substance and Sexual Activity  . Alcohol use: No    Alcohol/week: 1.0 standard drinks  Types: 1 Standard drinks or equivalent per week  . Drug use: No  . Sexual activity: Yes    Partners: Male    Birth control/protection: Post-menopausal  Lifestyle  . Physical activity    Days per week: Not on file    Minutes per session: Not on file  . Stress: Not on file  Relationships  . Social Herbalist on phone: Not on file    Gets together: Not on file    Attends religious service: Not on file    Active member of club or organization: Not on file    Attends meetings of clubs or organizations: Not on file    Relationship status: Not on file  Other Topics Concern  . Not on file  Social History Narrative   04/05/12 AM he was born  and grew up in [facility]. She has 3 older brothers. She never suffered any abuse. Her father died when she was 56 years of age. She completed her bachelor of science in nursing in 1989, and has been working as an Therapist, sports . She has been married for 21 years, and she and her husband have a 30 year old son and a 52 year old daughter. She affiliates as Psychologist, forensic. She denies any legal involvement. She enjoys hiking and reading. She reports her social support consists of friends from college, and a friend from church. 04/05/2012     FAMILY HISTORY:  We obtained a detailed, 4-generation family history.  Significant diagnoses are listed below: Family History  Adopted: Yes  Problem Relation Age of Onset  . Depression Brother   . Atrial fibrillation Brother   . Hypertension Mother   . Osteoporosis Mother   . ADD / ADHD Son   . ADD / ADHD Daughter   . Diabetes Maternal Grandmother   . Diabetes Maternal Grandfather   . Diabetes Paternal Grandmother   . Diabetes Paternal Grandfather   . Parkinson's disease Maternal Aunt     Stephanie [name] has a son, 62, and a daughter, 37, no cancer diagnoses. She has 3 brothers, all living with no cancers. No cancers in nieces/nephews.  Stephanie [name] was adopted but does have information about her biological mother's side of the family. Her biological mother is living at 65, no history of cancer. Patient had 1 biological maternal aunt, no history of cancer. No cancers in maternal cousins. No cancers in maternal grandparents.  Stephanie [name] does not have information about her father's side of the family, other than he had a heart defect.  Stephanie [name] is unaware of previous family history of genetic testing for hereditary cancer risks. Patient's maternal ancestors are of unknown descent, and paternal ancestors are of unknown descent. There is no reported Ashkenazi Jewish ancestry. There is no known consanguinity.  GENETIC COUNSELING ASSESSMENT: Stephanie [name] is a 53 y.o.  female with a personal history which is somewhat suggestive of a hereditary cancer syndrome and predisposition to cancer. We, therefore, discussed and recommended the following at today's visit.   DISCUSSION: We discussed that 5 - 10% of breast cancer is hereditary, with most cases associated with BRCA1/BRCA2 mutations.  There are other genes that can be associated with hereditary breast cancer syndromes.  These include PALB2, ATM, CHEK2.  We discussed that testing is beneficial for several reasons including surgical decision-making for breast cancer, knowing how to follow individuals after completing their treatment, and understand if other family members could be at risk for cancer and allow them to undergo genetic testing.  We reviewed the characteristics, features and inheritance patterns of hereditary cancer syndromes. We also discussed genetic testing, including the appropriate family members to test, the process of testing, insurance coverage and turn-around-time for results. We discussed the implications of a negative, positive and/or variant of uncertain significant result. In order to get genetic test results in a timely manner so that Stephanie [name] can use these genetic test results for surgical decisions, we recommended Stephanie [name] pursue genetic testing for the STAT Breast Cancer Panel. Once complete, we recommend Stephanie [name] pursue reflex genetic testing to the Common Hereditary Cancers gene panel.   The STAT Breast cancer panel offered by [facility] includes sequencing and rearrangement analysis for the following 9 genes:  ATM, BRCA1, BRCA2, CDH1, CHEK2, PALB2, PTEN, STK11 and TP53.    The Common Hereditary Cancers Panel offered by [facility] includes sequencing and/or deletion duplication testing of the following 48 genes: APC, ATM, AXIN2, BARD1, BMPR1A, BRCA1, BRCA2, BRIP1, CDH1, CDKN2A (p14ARF), CDKN2A (p16INK4a), CKD4, CHEK2, CTNNA1, DICER1, EPCAM (Deletion/duplication testing only), GREM1 (promoter  region deletion/duplication testing only), KIT, MEN1, MLH1, MSH2, MSH3, MSH6, MUTYH, NBN, NF1, NHTL1, PALB2, PDGFRA, PMS2, POLD1, POLE, PTEN, RAD50, RAD51C, RAD51D, RNF43, SDHB, SDHC, SDHD, SMAD4, SMARCA4. STK11, TP53, TSC1, TSC2, and VHL.  The following genes were evaluated for sequence changes only: SDHA and HOXB13 c.251G>A variant only.  Based on Stephanie [name]'s personal and family history of cancer, she does not meet medical criteria for genetic testing. She may have an out of pocket cost.   PLAN: After considering the risks, benefits, and limitations, Stephanie [name] provided informed consent to pursue genetic testing and the blood sample was sent to [facility] for analysis of the STAT Breast Cancer Panel + Common Hereditary Cancers Panel. Results should be available within approximately 2-3 weeks' time, at which point they will be disclosed by telephone to Stephanie [name], as will any additional recommendations warranted by these results. Stephanie [name] will receive a summary of her genetic counseling visit and a copy of her results once available. This information will also be available in Epic.   Lastly, we encouraged Stephanie [name] to remain in contact with cancer genetics annually so that we can continuously update the family history and inform her of any changes in cancer genetics and testing that may be of benefit for this family.   Stephanie [name] questions were answered to her satisfaction today. Our contact information was provided should additional questions or concerns arise. Thank you for the referral and allowing [place] to share in the care of your patient.   Faith Rogue, MS, [facility] Genetic Counselor [email or web address] Phone: [phone]  The patient was seen for a total of 30 minutes in virtual genetic counseling.  Drs. Magrinat, Lindi Adie and/or Burr Medico were available for discussion regarding this case.   _______________________________________________________________________ For Office  Staff:  Number of people involved in session: 1 Was an Intern/ student involved with case: no

## 2019-01-17 ENCOUNTER — Other Ambulatory Visit: Payer: Self-pay

## 2019-01-17 ENCOUNTER — Ambulatory Visit
Admission: RE | Admit: 2019-01-17 | Discharge: 2019-01-17 | Disposition: A | Discharge status: Home / Self Care | Payer: 59 | Source: Ambulatory Visit | Attending: Family Medicine | Admitting: Family Medicine

## 2019-01-17 ENCOUNTER — Other Ambulatory Visit: Payer: Self-pay | Admitting: General Practice

## 2019-01-17 ENCOUNTER — Ambulatory Visit
Admission: RE | Admit: 2019-01-17 | Discharge: 2019-01-17 | Disposition: A | Discharge status: Home / Self Care | Payer: 59 | Source: Ambulatory Visit | Attending: General Surgery | Admitting: General Surgery

## 2019-01-17 DIAGNOSIS — C50912 Malignant neoplasm of unspecified site of left female breast: Secondary | ICD-10-CM

## 2019-01-17 DIAGNOSIS — D0512 Intraductal carcinoma in situ of left breast: Secondary | ICD-10-CM | POA: Diagnosis not present

## 2019-01-17 DIAGNOSIS — N6012 Diffuse cystic mastopathy of left breast: Secondary | ICD-10-CM | POA: Diagnosis not present

## 2019-01-17 DIAGNOSIS — R921 Mammographic calcification found on diagnostic imaging of breast: Secondary | ICD-10-CM

## 2019-01-18 ENCOUNTER — Encounter: Payer: Self-pay | Admitting: Adult Health

## 2019-01-19 ENCOUNTER — Telehealth: Payer: Self-pay | Admitting: Licensed Clinical Social Worker

## 2019-01-19 ENCOUNTER — Other Ambulatory Visit: Payer: Self-pay | Admitting: General Surgery

## 2019-01-19 DIAGNOSIS — D0512 Intraductal carcinoma in situ of left breast: Secondary | ICD-10-CM

## 2019-01-19 NOTE — Telephone Encounter (Signed)
Revealed negative genetic testing.This normal result is reassuring and indicates that it is unlikely Stephanie [name]'s cancer is due to a hereditary cause.  It is unlikely that there is an increased risk of another cancer due to a mutation in one of these genes. However, genetic testing is not perfect, and cannot definitively rule out a hereditary cause.  It will be important for her to keep in contact with genetics to learn if any additional testing may be needed in the future.

## 2019-01-20 ENCOUNTER — Encounter: Payer: Self-pay | Admitting: Licensed Clinical Social Worker

## 2019-01-20 ENCOUNTER — Ambulatory Visit: Payer: Self-pay | Admitting: Licensed Clinical Social Worker

## 2019-01-20 DIAGNOSIS — Z170 Estrogen receptor positive status [ER+]: Secondary | ICD-10-CM

## 2019-01-20 DIAGNOSIS — Z1379 Encounter for other screening for genetic and chromosomal anomalies: Secondary | ICD-10-CM

## 2019-01-20 DIAGNOSIS — C50412 Malignant neoplasm of upper-outer quadrant of left female breast: Secondary | ICD-10-CM

## 2019-01-20 NOTE — Progress Notes (Signed)
HPI:  Stephanie [name] was previously seen in the [facility] clinic due to a personal history of breast cancer, unknown family history and concerns regarding a hereditary predisposition to cancer. Please refer to our prior cancer genetics clinic note for more information regarding our discussion, assessment and recommendations, at the time. Stephanie [name] recent genetic test results were disclosed to her, as were recommendations warranted by these results. These results and recommendations are discussed in more detail below.  CANCER HISTORY:  Oncology History  Malignant neoplasm of upper-outer quadrant of left breast in female, estrogen receptor positive ([facility])  01/06/2019 Cancer Staging   Staging form: Breast, AJCC 8th Edition - Clinical stage from 01/06/2019: Stage 0 (cTis (DCIS), cN0, cM0, ER+, PR+) - Signed by Gardenia Phlegm, NP on 01/18/2019   01/16/2019 Initial Diagnosis   Malignant neoplasm of upper-outer quadrant of left breast in female, estrogen receptor positive ([facility])   01/19/2019 Genetic Testing   Negative genetic testing. No pathogenic variants identified on the [facility] Breast Cancer STAT Panel + Common Hereditary Cancers Panel. The STAT Breast cancer panel offered by [facility] includes sequencing and rearrangement analysis for the following 9 genes:  ATM, BRCA1, BRCA2, CDH1, CHEK2, PALB2, PTEN, STK11 and TP53.  The Common Hereditary Cancers Panel offered by [facility] includes sequencing and/or deletion duplication testing of the following 47 genes: APC, ATM, AXIN2, BARD1, BMPR1A, BRCA1, BRCA2, BRIP1, CDH1, CDKN2A (p14ARF), CDKN2A (p16INK4a), CKD4, CHEK2, CTNNA1, DICER1, EPCAM (Deletion/duplication testing only), GREM1 (promoter region deletion/duplication testing only), KIT, MEN1, MLH1, MSH2, MSH3, MSH6, MUTYH, NBN, NF1, NHTL1, PALB2, PDGFRA, PMS2, POLD1, POLE, PTEN, RAD50, RAD51C, RAD51D, SDHB, SDHC, SDHD, SMAD4, SMARCA4. STK11, TP53, TSC1, TSC2, and VHL.  The following genes  were evaluated for sequence changes only: SDHA and HOXB13 c.251G>A variant only. The report date is 01/19/2019.      FAMILY HISTORY:  We obtained a detailed, 4-generation family history.  Significant diagnoses are listed below: Family History  Adopted: Yes  Problem Relation Age of Onset  . Depression Brother   . Atrial fibrillation Brother   . Hypertension Mother   . Osteoporosis Mother   . ADD / ADHD Son   . ADD / ADHD Daughter   . Diabetes Maternal Grandmother   . Diabetes Maternal Grandfather   . Diabetes Paternal Grandmother   . Diabetes Paternal Grandfather   . Parkinson's disease Maternal Aunt     Stephanie [name] has a son, 47, and a daughter, 78, no cancer diagnoses. She has 3 brothers, all living with no cancers. No cancers in nieces/nephews.  Stephanie [name] was adopted but does have information about her biological mother's side of the family. Her biological mother is living at 87, no history of cancer. Patient had 1 biological maternal aunt, no history of cancer. No cancers in maternal cousins. No cancers in maternal grandparents.  Stephanie [name] does not have information about her father's side of the family, other than he had a heart defect.  Stephanie [name] is unaware of previous family history of genetic testing for hereditary cancer risks. Patient's maternal ancestors are of unknown descent, and paternal ancestors are of unknown descent. There is no reported Ashkenazi Jewish ancestry. There is no known consanguinity.  GENETIC TEST RESULTS: Genetic testing reported out on 01/19/2019 through the [facility] Breast Cancer STAT Panel + Common Hereditary cancer panel found no pathogenic mutations. The STAT Breast cancer panel offered by [facility] includes sequencing and rearrangement analysis for the following 9 genes:  ATM, BRCA1, BRCA2, CDH1, CHEK2, PALB2,  PTEN, STK11 and TP53.  The Common Hereditary Cancers Panel offered by [facility] includes sequencing and/or deletion duplication testing of the  following 47 genes: APC, ATM, AXIN2, BARD1, BMPR1A, BRCA1, BRCA2, BRIP1, CDH1, CDKN2A (p14ARF), CDKN2A (p16INK4a), CKD4, CHEK2, CTNNA1, DICER1, EPCAM (Deletion/duplication testing only), GREM1 (promoter region deletion/duplication testing only), KIT, MEN1, MLH1, MSH2, MSH3, MSH6, MUTYH, NBN, NF1, NHTL1, PALB2, PDGFRA, PMS2, POLD1, POLE, PTEN, RAD50, RAD51C, RAD51D, SDHB, SDHC, SDHD, SMAD4, SMARCA4. STK11, TP53, TSC1, TSC2, and VHL.  The following genes were evaluated for sequence changes only: SDHA and HOXB13 c.251G>A variant only. The test report has been scanned into EPIC and is located under the Molecular Pathology section of the Results Review tab.  A portion of the result report is included below for reference.     We discussed with Stephanie [name] that because current genetic testing is not perfect, it is possible there may be a gene mutation in one of these genes that current testing cannot detect, but that chance is small.  We also discussed, that there could be a mutation in a gene that has not yet been discovered, or that we have not yet tested. It is also possible there is a hereditary cause for the cancer in the family that Stephanie [name] did not inherit and therefore was not identified in her testing.  Therefore, it is important to remain in touch with cancer genetics in the future so that we can continue to offer Stephanie [name] the most up to date genetic testing.   ADDITIONAL GENETIC TESTING: We discussed with Stephanie [name] that her genetic testing was fairly extensive.  If there are genes identified to increase cancer risk that can be analyzed in the future, we would be happy to discuss and coordinate this testing at that time.    CANCER SCREENING RECOMMENDATIONS: Stephanie [name] test result is considered negative (normal).  This means that we have not identified a hereditary cause for her personal history of cancer at this time. Most cancers happen by chance and this negative test suggests that her cancer may  fall into this category.    While reassuring, this does not definitively rule out a hereditary predisposition to cancer. It is still possible that there could be genetic mutations that are undetectable by current technology. There could be genetic mutations in genes that have not been tested or identified to increase cancer risk.  Therefore, it is recommended she continue to follow the cancer management and screening guidelines provided by her oncology and primary healthcare provider.   An individual's cancer risk and medical management are not determined by genetic test results alone. Overall cancer risk assessment incorporates additional factors, including personal medical history, family history, and any available genetic information that may result in a personalized plan for cancer prevention and surveillance.  RECOMMENDATIONS FOR FAMILY MEMBERS:  Relatives in this family might be at some increased risk of developing cancer, over the general population risk, simply due to the family history of cancer.  We recommended female relatives in this family have a yearly mammogram beginning at age 21, or 51 years younger than the earliest onset of cancer, an annual clinical breast exam, and perform monthly breast self-exams. Female relatives in this family should also have a gynecological exam as recommended by their primary provider. All family members should have a colonoscopy by age 89, or as directed by their physicians.  FOLLOW-UP: Lastly, we discussed with Stephanie [name] that cancer genetics is a rapidly advancing field and it is possible that  new genetic tests will be appropriate for her and/or her family members in the future. We encouraged her to remain in contact with cancer genetics on an annual basis so we can update her personal and family histories and let her know of advances in cancer genetics that may benefit this family.   Our contact number was provided. Stephanie [name] questions were answered to  her satisfaction, and she knows she is welcome to call us at anytime with additional questions or concerns.   Faith Rogue, MS, [facility] Genetic Counselor [email or web address] Phone: [phone]

## 2019-01-23 ENCOUNTER — Ambulatory Visit
Admission: RE | Admit: 2019-01-23 | Discharge: 2019-01-23 | Disposition: A | Discharge status: Home / Self Care | Payer: 59 | Source: Ambulatory Visit | Attending: General Surgery | Admitting: General Surgery

## 2019-01-23 DIAGNOSIS — D0512 Intraductal carcinoma in situ of left breast: Secondary | ICD-10-CM

## 2019-01-23 MED ORDER — GADOBUTROL 1 MMOL/ML IV SOLN
7.0000 mL | Freq: Once | INTRAVENOUS | Status: AC | PRN
  Administered 2019-01-23: 7 mL via INTRAVENOUS

## 2019-01-26 ENCOUNTER — Other Ambulatory Visit: Payer: Self-pay | Admitting: General Surgery

## 2019-01-26 DIAGNOSIS — D0512 Intraductal carcinoma in situ of left breast: Secondary | ICD-10-CM

## 2019-01-30 ENCOUNTER — Encounter: Payer: Self-pay | Admitting: *Deleted

## 2019-01-30 ENCOUNTER — Other Ambulatory Visit: Payer: Self-pay | Admitting: General Surgery

## 2019-01-30 DIAGNOSIS — D0512 Intraductal carcinoma in situ of left breast: Secondary | ICD-10-CM

## 2019-01-30 NOTE — Telephone Encounter (Signed)
Do you still want to see her? She wanted me to ask.

## 2019-01-31 ENCOUNTER — Ambulatory Visit: Payer: 59 | Admitting: Family Medicine

## 2019-01-31 ENCOUNTER — Encounter: Payer: Self-pay | Admitting: Family Medicine

## 2019-01-31 DIAGNOSIS — Z01419 Encounter for gynecological examination (general) (routine) without abnormal findings: Secondary | ICD-10-CM

## 2019-01-31 NOTE — Progress Notes (Signed)
Pt. Asked if she needed to keep appointment and we said to f/u in May of 2021.

## 2019-02-01 ENCOUNTER — Other Ambulatory Visit: Payer: Self-pay | Admitting: Family Medicine

## 2019-02-01 DIAGNOSIS — M810 Age-related osteoporosis without current pathological fracture: Secondary | ICD-10-CM

## 2019-02-01 MED FILL — ALENDRONATE NA 70 MG TAB: 70 | 84 days supply | Qty: 12 | Fill #0

## 2019-02-01 NOTE — Pre-Procedure Instructions (Signed)
[place], [place] [address] [place] [address] Phone: [phone] Fax: [phone]  [place], [place] [address] [place] [address] Phone: [phone] Fax: [phone]  [facility] [place], [place] [address] Phone: [phone] Fax: [phone]      Your procedure is scheduled on 02-09-19  Report to [facility] Main Entrance "A" at 1230 P.M., and check in at the Admitting office.  Call this number if you have problems the morning of surgery:  [phone]  Call [phone] if you have any questions prior to your surgery date Monday-Friday 8am-4pm    Remember:  Do not eat after midnight the night before your surgery  You may drink clear liquids until 1130AM the morning of your surgery.   Clear liquids allowed are: Water, Non-Citrus Juices (without pulp), Carbonated Beverages, Clear Tea, Black Coffee Only, and Gatorade  Please complete your PRE-SURGERY ENSURE that was provided to you by 1130AM the morning of surgery.  Please, if able, drink it in one setting. DO NOT SIP.   Take these medicines the morning of surgery with A SIP OF WATER : sertraline (ZOLOFT)  7 days prior to surgery STOP taking any Aspirin (unless otherwise instructed by your surgeon), Aleve, Naproxen, Ibuprofen, Motrin, Advil, Goody's, BC's, all herbal medications, fish oil, and all vitamins.    The Morning of Surgery  Do not wear jewelry, make-up or nail polish.  Do not wear lotions, powders, or perfumes, or deodorant  Do not shave 48 hours prior to surgery.    Do not bring valuables to the hospital.  [facility] is not responsible for any belongings or valuables.  If you are a smoker, DO NOT Smoke 24 hours prior to surgery IF you wear a CPAP at night please bring your mask, tubing, and machine the  morning of surgery   Remember that you must have someone to transport you home after your surgery, and remain with you for 24 hours if you are discharged the same day.   Contacts, glasses, hearing aids, dentures or bridgework may not be worn into surgery.    Leave your suitcase in the car.  After surgery it may be brought to your room.  For patients admitted to the hospital, discharge time will be determined by your treatment team.  Patients discharged the day of surgery will not be allowed to drive home.    Special instructions:   [place]- Preparing For Surgery  Before surgery, you can play an important role. Because skin is not sterile, your skin needs to be as free of germs as possible. You can reduce the number of germs on your skin by washing with CHG (chlorahexidine gluconate) Soap before surgery.  CHG is an antiseptic cleaner which kills germs and bonds with the skin to continue killing germs even after washing.    Oral Hygiene is also important to reduce your risk of infection.  Remember - BRUSH YOUR TEETH THE MORNING OF SURGERY WITH YOUR REGULAR TOOTHPASTE  Please do not use if you have an allergy to CHG or antibacterial soaps. If your skin becomes reddened/irritated stop using the CHG.  Do not shave (including legs and underarms) for at least 48 hours prior to first CHG shower. It is OK to shave your face.  Please follow these instructions carefully.   1. Shower the  NIGHT BEFORE SURGERY and the MORNING OF SURGERY with CHG Soap.   2. If you chose to wash your hair, wash your hair first as usual with your normal shampoo.  3. After you shampoo, rinse your hair and body thoroughly to remove the shampoo.  4. Use CHG as you would any other liquid soap. You can apply CHG directly to the skin and wash gently with a scrungie or a clean washcloth.   5. Apply the CHG Soap to your body ONLY FROM THE NECK DOWN.  Do not use on open wounds or open sores. Avoid contact with your  eyes, ears, mouth and genitals (private parts). Wash Face and genitals (private parts)  with your normal soap.   6. Wash thoroughly, paying special attention to the area where your surgery will be performed.  7. Thoroughly rinse your body with warm water from the neck down.  8. DO NOT shower/wash with your normal soap after using and rinsing off the CHG Soap.  9. Pat yourself dry with a CLEAN TOWEL.  10. Wear CLEAN PAJAMAS to bed the night before surgery, wear comfortable clothes the morning of surgery  11. Place CLEAN SHEETS on your bed the night of your first shower and DO NOT SLEEP WITH PETS.  Day of Surgery:  Do not apply any deodorants/lotions. Please shower the morning of surgery with the CHG soap  Please wear clean clothes to the hospital/surgery center.   Remember to brush your teeth WITH YOUR REGULAR TOOTHPASTE.   Please read over the  fact sheets that you were given.

## 2019-02-02 ENCOUNTER — Encounter (HOSPITAL_COMMUNITY): Payer: Self-pay

## 2019-02-02 ENCOUNTER — Other Ambulatory Visit: Payer: Self-pay

## 2019-02-02 ENCOUNTER — Encounter (HOSPITAL_COMMUNITY)
Admission: RE | Admit: 2019-02-02 | Discharge: 2019-02-02 | Disposition: A | Discharge status: Home / Self Care | Payer: 59 | Source: Ambulatory Visit | Attending: General Surgery | Admitting: General Surgery

## 2019-02-02 DIAGNOSIS — D0512 Intraductal carcinoma in situ of left breast: Secondary | ICD-10-CM | POA: Diagnosis not present

## 2019-02-02 DIAGNOSIS — Z01812 Encounter for preprocedural laboratory examination: Secondary | ICD-10-CM | POA: Insufficient documentation

## 2019-02-02 HISTORY — DX: Malignant (primary) neoplasm, unspecified: C80.1

## 2019-02-02 LAB — CBC
HCT: 39.7 % (ref 36.0–46.0)
Hemoglobin: 13.4 g/dL (ref 12.0–15.0)
MCH: 30.9 pg (ref 26.0–34.0)
MCHC: 33.8 g/dL (ref 30.0–36.0)
MCV: 91.7 fL (ref 80.0–100.0)
Platelets: 309 10*3/uL (ref 150–400)
RBC: 4.33 MIL/uL (ref 3.87–5.11)
RDW: 11.5 % (ref 11.5–15.5)
WBC: 6.7 10*3/uL (ref 4.0–10.5)
nRBC: 0 % (ref 0.0–0.2)

## 2019-02-02 LAB — BASIC METABOLIC PANEL
Anion gap: 12 (ref 5–15)
BUN: 13 mg/dL (ref 6–20)
CO2: 25 mmol/L (ref 22–32)
Calcium: 9.5 mg/dL (ref 8.9–10.3)
Chloride: 101 mmol/L (ref 98–111)
Creatinine, Ser: 0.83 mg/dL (ref 0.44–1.00)
GFR calc Af Amer: >60 mL/min (ref >60)
GFR calc non Af Amer: >60 mL/min (ref >60)
Glucose, Bld: 138 mg/dL — ABNORMAL HIGH (ref 70–99)
Potassium: 4.2 mmol/L (ref 3.5–5.1)
Sodium: 138 mmol/L (ref 135–145)

## 2019-02-02 NOTE — Progress Notes (Signed)
PCP: Dr. 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  Ensure drink provided, verbalized understanding.   Patient denies shortness of breath, fever, cough, and chest pain at PAT appointment.  Patient verbalized understanding of instructions provided today at the PAT appointment.  Patient asked to review instructions at home and day of surgery.

## 2019-02-03 ENCOUNTER — Other Ambulatory Visit (HOSPITAL_COMMUNITY): Payer: 59

## 2019-02-03 ENCOUNTER — Other Ambulatory Visit: Payer: 59

## 2019-02-03 ENCOUNTER — Encounter: Payer: Self-pay | Admitting: *Deleted

## 2019-02-03 ENCOUNTER — Telehealth: Payer: Self-pay | Admitting: Oncology

## 2019-02-03 NOTE — Telephone Encounter (Signed)
Received a new patient referral from Dr. Donne Hazel for DCIS. Stephanie [name] has been cld and scheduled to see Dr. Jana Hakim on 11/3 at 4pm w/labs at 3:30pm. She's been made aware to arrive 15 minutes early.

## 2019-02-06 ENCOUNTER — Other Ambulatory Visit (HOSPITAL_COMMUNITY)
Admission: RE | Admit: 2019-02-06 | Discharge: 2019-02-06 | Disposition: A | Discharge status: Home / Self Care | Payer: 59 | Source: Ambulatory Visit | Attending: General Surgery | Admitting: General Surgery

## 2019-02-06 DIAGNOSIS — Z01812 Encounter for preprocedural laboratory examination: Secondary | ICD-10-CM | POA: Diagnosis not present

## 2019-02-06 DIAGNOSIS — Z20828 Contact with and (suspected) exposure to other viral communicable diseases: Secondary | ICD-10-CM | POA: Diagnosis not present

## 2019-02-06 LAB — SARS CORONAVIRUS 2 (TAT 6-24 HRS): SARS Coronavirus 2: NEGATIVE

## 2019-02-07 ENCOUNTER — Telehealth: Payer: Self-pay | Admitting: *Deleted

## 2019-02-07 NOTE — Telephone Encounter (Signed)
Called pt and discussed tx care plan. Discussed xrt and time line as well as some of the more common side effects from AI and xrt. Pt relate doing well and denies further needs or questions. Contact information provided.

## 2019-02-08 ENCOUNTER — Ambulatory Visit
Admission: RE | Admit: 2019-02-08 | Discharge: 2019-02-08 | Disposition: A | Discharge status: Home / Self Care | Payer: 59 | Source: Ambulatory Visit | Attending: General Surgery | Admitting: General Surgery

## 2019-02-08 ENCOUNTER — Other Ambulatory Visit: Payer: Self-pay

## 2019-02-08 DIAGNOSIS — D0512 Intraductal carcinoma in situ of left breast: Secondary | ICD-10-CM | POA: Diagnosis not present

## 2019-02-08 DIAGNOSIS — R928 Other abnormal and inconclusive findings on diagnostic imaging of breast: Secondary | ICD-10-CM | POA: Diagnosis not present

## 2019-02-08 NOTE — Anesthesia Preprocedure Evaluation (Addendum)
Anesthesia Evaluation  Patient identified by MRN, date of birth, ID band Patient awake    Reviewed: Allergy & Precautions, NPO status , Patient's Chart, lab work & pertinent test results  Airway Mallampati: I       Dental no notable dental hx. (+) Teeth Intact   Pulmonary neg pulmonary ROS,    Pulmonary exam normal breath sounds clear to auscultation       Cardiovascular negative cardio ROS Normal cardiovascular exam Rhythm:Regular Rate:Normal     Neuro/Psych  Headaches, PSYCHIATRIC DISORDERS Anxiety Depression negative neurological ROS  negative psych ROS   GI/Hepatic negative GI ROS, Neg liver ROS,   Endo/Other  negative endocrine ROS  Renal/GU negative Renal ROS  negative genitourinary   Musculoskeletal negative musculoskeletal ROS (+)   Abdominal Normal abdominal exam  (+)   Peds  Hematology negative hematology ROS (+)   Anesthesia Other Findings Left breast cancer  Reproductive/Obstetrics negative OB ROS                          Anesthesia Physical  Anesthesia Plan  ASA: II  Anesthesia Plan: General   Post-op Pain Management:  Regional for Post-op pain   Induction: Intravenous  PONV Risk Score and Plan: 3 and Ondansetron, Dexamethasone and Midazolam  Airway Management Planned: LMA  Additional Equipment: None  Intra-op Plan:   Post-operative Plan: Extubation in OR  Informed Consent: I have reviewed the patients History and Physical, chart, labs and discussed the procedure including the risks, benefits and alternatives for the proposed anesthesia with the patient or authorized representative who has indicated his/her understanding and acceptance.     Dental advisory given  Plan Discussed with: CRNA  Anesthesia Plan Comments:       Anesthesia Quick Evaluation

## 2019-02-09 ENCOUNTER — Ambulatory Visit (HOSPITAL_COMMUNITY): Payer: 59 | Admitting: Anesthesiology

## 2019-02-09 ENCOUNTER — Other Ambulatory Visit: Payer: Self-pay

## 2019-02-09 ENCOUNTER — Encounter (HOSPITAL_COMMUNITY)
Admission: RE | Disposition: A | Discharge status: Home / Self Care | Payer: Self-pay | Source: Home / Self Care | Attending: General Surgery

## 2019-02-09 ENCOUNTER — Ambulatory Visit
Admission: RE | Admit: 2019-02-09 | Discharge: 2019-02-09 | Disposition: A | Discharge status: Home / Self Care | Payer: 59 | Source: Ambulatory Visit | Attending: General Surgery | Admitting: General Surgery

## 2019-02-09 ENCOUNTER — Ambulatory Visit (HOSPITAL_COMMUNITY)
Admission: RE | Admit: 2019-02-09 | Discharge: 2019-02-09 | Disposition: A | Discharge status: Home / Self Care | Payer: 59 | Attending: General Surgery | Admitting: General Surgery

## 2019-02-09 ENCOUNTER — Encounter (HOSPITAL_COMMUNITY): Payer: Self-pay

## 2019-02-09 DIAGNOSIS — D0512 Intraductal carcinoma in situ of left breast: Secondary | ICD-10-CM

## 2019-02-09 DIAGNOSIS — M810 Age-related osteoporosis without current pathological fracture: Secondary | ICD-10-CM | POA: Diagnosis not present

## 2019-02-09 DIAGNOSIS — F419 Anxiety disorder, unspecified: Secondary | ICD-10-CM | POA: Insufficient documentation

## 2019-02-09 DIAGNOSIS — F329 Major depressive disorder, single episode, unspecified: Secondary | ICD-10-CM | POA: Insufficient documentation

## 2019-02-09 DIAGNOSIS — Z79899 Other long term (current) drug therapy: Secondary | ICD-10-CM | POA: Diagnosis not present

## 2019-02-09 DIAGNOSIS — F418 Other specified anxiety disorders: Secondary | ICD-10-CM | POA: Diagnosis not present

## 2019-02-09 DIAGNOSIS — N6012 Diffuse cystic mastopathy of left breast: Secondary | ICD-10-CM | POA: Diagnosis not present

## 2019-02-09 DIAGNOSIS — R928 Other abnormal and inconclusive findings on diagnostic imaging of breast: Secondary | ICD-10-CM | POA: Diagnosis not present

## 2019-02-09 DIAGNOSIS — G8918 Other acute postprocedural pain: Secondary | ICD-10-CM | POA: Diagnosis not present

## 2019-02-09 HISTORY — PX: RADIOACTIVE SEED GUIDED EXCISIONAL BREAST BIOPSY: SHX6490

## 2019-02-09 HISTORY — PX: BREAST LUMPECTOMY: SHX2

## 2019-02-09 HISTORY — PX: BREAST LUMPECTOMY WITH RADIOACTIVE SEED LOCALIZATION: SHX6424

## 2019-02-09 SURGERY — BREAST LUMPECTOMY WITH RADIOACTIVE SEED LOCALIZATION
Anesthesia: Regional | Site: Breast | Laterality: Left

## 2019-02-09 MED ORDER — 0.9 % SODIUM CHLORIDE (POUR BTL) OPTIME
TOPICAL | Status: DC | PRN
  Administered 2019-02-09: 1000 mL

## 2019-02-09 MED ORDER — ACETAMINOPHEN 500 MG PO TABS
1000.0000 mg | ORAL_TABLET | ORAL | Status: AC
  Administered 2019-02-09: 09:00:00 1000 mg via ORAL

## 2019-02-09 MED ORDER — FENTANYL CITRATE (PF) 100 MCG/2ML IJ SOLN
25.0000 ug | INTRAMUSCULAR | Status: DC | PRN
  Administered 2019-02-09: 25 ug via INTRAVENOUS

## 2019-02-09 MED ORDER — ONDANSETRON HCL 4 MG/2ML IJ SOLN
INTRAMUSCULAR | Status: AC
  Filled 2019-02-09: qty 2

## 2019-02-09 MED ORDER — KETOROLAC TROMETHAMINE 15 MG/ML IJ SOLN
15.0000 mg | INTRAMUSCULAR | Status: AC
  Administered 2019-02-09: 09:00:00 15 mg via INTRAVENOUS

## 2019-02-09 MED ORDER — FENTANYL CITRATE (PF) 100 MCG/2ML IJ SOLN
75.0000 ug | Freq: Once | INTRAMUSCULAR | Status: AC
  Administered 2019-02-09: 09:00:00 75 ug via INTRAVENOUS

## 2019-02-09 MED ORDER — ROPIVACAINE HCL 5 MG/ML IJ SOLN
INTRAMUSCULAR | Status: DC | PRN
  Administered 2019-02-09 (×10): 3 mL via PERINEURAL

## 2019-02-09 MED ORDER — EPHEDRINE SULFATE 50 MG/ML IJ SOLN
INTRAMUSCULAR | Status: DC | PRN
  Administered 2019-02-09: 5 mg via INTRAVENOUS

## 2019-02-09 MED ORDER — BUPIVACAINE-EPINEPHRINE 0.25% -1:200000 IJ SOLN
INTRAMUSCULAR | Status: DC | PRN
  Administered 2019-02-09: 10 mL

## 2019-02-09 MED ORDER — DEXAMETHASONE SODIUM PHOSPHATE 10 MG/ML IJ SOLN
INTRAMUSCULAR | Status: DC | PRN
  Administered 2019-02-09: 10 mg via INTRAVENOUS

## 2019-02-09 MED ORDER — CEFAZOLIN SODIUM-DEXTROSE 2-4 GM/100ML-% IV SOLN
2.0000 g | INTRAVENOUS | Status: AC
  Administered 2019-02-09: 11:00:00 2 g via INTRAVENOUS

## 2019-02-09 MED ORDER — ENSURE PRE-SURGERY PO LIQD: 296.0000 mL | Freq: Once | ORAL | Status: DC

## 2019-02-09 MED ORDER — MIDAZOLAM HCL 2 MG/2ML IJ SOLN
INTRAMUSCULAR | Status: AC
  Administered 2019-02-09: 09:00:00 1.5 mg via INTRAVENOUS
  Filled 2019-02-09: qty 2

## 2019-02-09 MED ORDER — PROPOFOL 10 MG/ML IV BOLUS
INTRAVENOUS | Status: DC | PRN
  Administered 2019-02-09: 150 mg via INTRAVENOUS

## 2019-02-09 MED ORDER — LIDOCAINE 2% (20 MG/ML) 5 ML SYRINGE
INTRAMUSCULAR | Status: AC
  Filled 2019-02-09: qty 5

## 2019-02-09 MED ORDER — CEFAZOLIN SODIUM-DEXTROSE 2-4 GM/100ML-% IV SOLN
INTRAVENOUS | Status: AC
  Filled 2019-02-09: qty 100

## 2019-02-09 MED ORDER — PHENYLEPHRINE HCL (PRESSORS) 10 MG/ML IV SOLN
INTRAVENOUS | Status: DC | PRN
  Administered 2019-02-09: 80 ug via INTRAVENOUS

## 2019-02-09 MED ORDER — ONDANSETRON HCL 4 MG/2ML IJ SOLN
INTRAMUSCULAR | Status: DC | PRN
  Administered 2019-02-09: 4 mg via INTRAVENOUS

## 2019-02-09 MED ORDER — MIDAZOLAM HCL 2 MG/2ML IJ SOLN
1.5000 mg | Freq: Once | INTRAMUSCULAR | Status: AC
  Administered 2019-02-09: 09:00:00 1.5 mg via INTRAVENOUS

## 2019-02-09 MED ORDER — ACETAMINOPHEN 500 MG PO TABS
ORAL_TABLET | ORAL | Status: AC
  Administered 2019-02-09: 1000 mg via ORAL
  Filled 2019-02-09: qty 2

## 2019-02-09 MED ORDER — FENTANYL CITRATE (PF) 250 MCG/5ML IJ SOLN
INTRAMUSCULAR | Status: AC
  Filled 2019-02-09: qty 5

## 2019-02-09 MED ORDER — DEXAMETHASONE SODIUM PHOSPHATE 10 MG/ML IJ SOLN
INTRAMUSCULAR | Status: AC
  Filled 2019-02-09: qty 1

## 2019-02-09 MED ORDER — FENTANYL CITRATE (PF) 100 MCG/2ML IJ SOLN
INTRAMUSCULAR | Status: AC
  Filled 2019-02-09: qty 2

## 2019-02-09 MED ORDER — SODIUM CHLORIDE 0.9 % IV SOLN
INTRAVENOUS | Status: DC | PRN
  Administered 2019-02-09: 12:00:00 20 ug/min via INTRAVENOUS

## 2019-02-09 MED ORDER — FENTANYL CITRATE (PF) 100 MCG/2ML IJ SOLN
INTRAMUSCULAR | Status: AC
  Administered 2019-02-09: 75 ug via INTRAVENOUS
  Filled 2019-02-09: qty 2

## 2019-02-09 MED ORDER — MIDAZOLAM HCL 2 MG/2ML IJ SOLN
INTRAMUSCULAR | Status: AC
  Filled 2019-02-09: qty 2

## 2019-02-09 MED ORDER — LACTATED RINGERS IV SOLN
INTRAVENOUS | Status: DC
  Administered 2019-02-09: 09:00:00 via INTRAVENOUS

## 2019-02-09 MED ORDER — PROPOFOL 10 MG/ML IV BOLUS
INTRAVENOUS | Status: AC
  Filled 2019-02-09: qty 20

## 2019-02-09 MED ORDER — BUPIVACAINE-EPINEPHRINE (PF) 0.25% -1:200000 IJ SOLN
INTRAMUSCULAR | Status: AC
  Filled 2019-02-09: qty 30

## 2019-02-09 MED ORDER — BUPIVACAINE LIPOSOME 1.3 % IJ SUSP
INTRAMUSCULAR | Status: DC | PRN
  Administered 2019-02-09 (×10): 1 mL via PERINEURAL

## 2019-02-09 MED ORDER — GABAPENTIN 100 MG PO CAPS
100.0000 mg | ORAL_CAPSULE | ORAL | Status: AC
  Administered 2019-02-09: 09:00:00 100 mg via ORAL

## 2019-02-09 MED ORDER — TRAMADOL HCL 50 MG PO TABS: 100.0000 mg | ORAL_TABLET | Freq: Four times a day (QID) | ORAL | 0 refills | Status: DC | PRN

## 2019-02-09 MED ORDER — GABAPENTIN 100 MG PO CAPS
ORAL_CAPSULE | ORAL | Status: AC
  Administered 2019-02-09: 100 mg via ORAL
  Filled 2019-02-09: qty 1

## 2019-02-09 MED ORDER — KETOROLAC TROMETHAMINE 15 MG/ML IJ SOLN
INTRAMUSCULAR | Status: AC
  Administered 2019-02-09: 15 mg via INTRAVENOUS
  Filled 2019-02-09: qty 1

## 2019-02-09 MED ORDER — LIDOCAINE 2% (20 MG/ML) 5 ML SYRINGE
INTRAMUSCULAR | Status: DC | PRN
  Administered 2019-02-09: 100 mg via INTRAVENOUS

## 2019-02-09 MED FILL — traMADol HCL 50 MG TABS: 50 | 2 days supply | Qty: 10 | Fill #0

## 2019-02-09 SURGICAL SUPPLY — 46 items
ADH SKN CLS APL DERMABOND .7 (GAUZE/BANDAGES/DRESSINGS) ×1
APL PRP STRL LF DISP 70% ISPRP (MISCELLANEOUS) ×1
APPLIER CLIP 9.375 MED OPEN (MISCELLANEOUS)
APR CLP MED 9.3 20 MLT OPN (MISCELLANEOUS)
BINDER BREAST LRG (GAUZE/BANDAGES/DRESSINGS) IMPLANT
BINDER BREAST XLRG (GAUZE/BANDAGES/DRESSINGS) IMPLANT
CANISTER SUCT 3000ML PPV (MISCELLANEOUS) ×2 IMPLANT
CHLORAPREP W/TINT 26 (MISCELLANEOUS) ×2 IMPLANT
CLIP APPLIE 9.375 MED OPEN (MISCELLANEOUS) IMPLANT
CLIP VESOCCLUDE MED 6/CT (CLIP) ×2 IMPLANT
COVER PROBE W GEL 5X96 (DRAPES) ×2 IMPLANT
COVER SURGICAL LIGHT HANDLE (MISCELLANEOUS) ×2 IMPLANT
COVER WAND RF STERILE (DRAPES) ×2 IMPLANT
DERMABOND ADVANCED (GAUZE/BANDAGES/DRESSINGS) ×1
DERMABOND ADVANCED .7 DNX12 (GAUZE/BANDAGES/DRESSINGS) ×1 IMPLANT
DEVICE DUBIN SPECIMEN MAMMOGRA (MISCELLANEOUS) ×2 IMPLANT
DRAPE CHEST BREAST 15X10 FENES (DRAPES) ×2 IMPLANT
ELECT COATED BLADE 2.86 ST (ELECTRODE) ×2 IMPLANT
ELECT REM PT RETURN 9FT ADLT (ELECTROSURGICAL) ×2
ELECTRODE REM PT RTRN 9FT ADLT (ELECTROSURGICAL) ×1 IMPLANT
GLOVE BIO SURGEON STRL SZ7 (GLOVE) ×4 IMPLANT
GLOVE BIOGEL PI IND STRL 7.5 (GLOVE) ×1 IMPLANT
GLOVE BIOGEL PI INDICATOR 7.5 (GLOVE) ×1
GOWN STRL REUS W/ TWL LRG LVL3 (GOWN DISPOSABLE) ×2 IMPLANT
GOWN STRL REUS W/TWL LRG LVL3 (GOWN DISPOSABLE) ×4
HEMOSTAT ARISTA ABSORB 3G PWDR (HEMOSTASIS) IMPLANT
ILLUMINATOR WAVEGUIDE N/F (MISCELLANEOUS) IMPLANT
KIT BASIN OR (CUSTOM PROCEDURE TRAY) ×2 IMPLANT
KIT MARKER MARGIN INK (KITS) ×2 IMPLANT
LIGHT WAVEGUIDE WIDE FLAT (MISCELLANEOUS) IMPLANT
NDL HYPO 25GX1X1/2 BEV (NEEDLE) ×1 IMPLANT
NEEDLE HYPO 25GX1X1/2 BEV (NEEDLE) ×2 IMPLANT
NS IRRIG 1000ML POUR BTL (IV SOLUTION) ×2 IMPLANT
PACK GENERAL/GYN (CUSTOM PROCEDURE TRAY) ×2 IMPLANT
PENCIL SMOKE EVACUATOR (MISCELLANEOUS) ×1 IMPLANT
STRIP CLOSURE SKIN 1/2X4 (GAUZE/BANDAGES/DRESSINGS) ×2 IMPLANT
SUT MNCRL AB 4-0 PS2 18 (SUTURE) ×2 IMPLANT
SUT MON AB 5-0 PS2 18 (SUTURE) IMPLANT
SUT SILK 2 0 SH (SUTURE) IMPLANT
SUT VIC AB 2-0 SH 27 (SUTURE) ×2
SUT VIC AB 2-0 SH 27XBRD (SUTURE) ×1 IMPLANT
SUT VIC AB 3-0 SH 27 (SUTURE) ×2
SUT VIC AB 3-0 SH 27X BRD (SUTURE) ×1 IMPLANT
SYR CONTROL 10ML LL (SYRINGE) ×2 IMPLANT
TOWEL GREEN STERILE (TOWEL DISPOSABLE) ×2 IMPLANT
TOWEL GREEN STERILE FF (TOWEL DISPOSABLE) ×2 IMPLANT

## 2019-02-09 NOTE — Op Note (Signed)
Preoperative diagnosis:Left breast dcis Left breast calcifications with fibrosis on core biopsy Postoperative diagnosis: same as above Procedure:Left breast seed guided excisional biopsy Left breast seed guided lumpectomy Surgeon: Dr Serita Grammes Anesthesia: general EBL: minimal Specimens: 1. Left breast seed guided excisional biopsy, x clip in specimen, seed separate 2. Left breast seed guided lumpectomy containing seed and clip 3. Left breast seed guided lumpectomy additional inferior and posterior margins Complications none Drains none Sponge and needle count correct dispo to recovery stable  Indications:53 yof with two prior excisional biopsies of atypia. She now has new calcifications that are DCIS. She has another area in prior surgical bed that is fibrosis with calcs present that is recommended for excision. We discussed options and elected to proceed with lumpectomy for dcis and excisional biopsy of the other area.    Procedure: After informed consent obtained patient was taken to the OR. She was given antibiotics and SCDs were in place. she underwent a pec block. She was placed under general anesthesia without complication. She was prepped and draped in the standard sterile surgical fashion. Timeout was performed. She had two seeds in place and I had the mammograms.   I located both seeds. The first was anterior and this was the x clip. I infiltrated marcaine and reentered the periareolar incision for the third time.  I then used the neoprobe to guide excision of the x clip and seed.  This appeared clinically as scar tissue. I then marked this with paint. Specimen mammogram confirmed removal of the seed and clip. The seed was separate as was very anterior.  I then proceeded to identify the other seed in the lateral breast. I tunneled to it under the skin. I then used the neoprobe to excise the seed and surrounding tissue in order to get a clear margin.  Specimen mammogram  confirmed removal of seed and clip. I thought the seed was close posteriorly and inferiorly so I removed these margins. They were marked short superior, long lateral double deep.  I then obtained hemostasis. Clips were placed in cavity of dcis excision. I then closed tissue with 2-0 vicryl. The skin was closed with 3-0 vicryl and 4-0 monocryl. I did placed 3 3-0 nylon sutures as rest was just closing scar and hopefully this will help heal and keep together.  Glue was placed. She tolerated well and was transferred to recovery stable

## 2019-02-09 NOTE — H&P (Signed)
53 yof who I know from left breast excision for alh in 2018. she did not follow up then. she has been followed for left breast calcs for some time now. her density is c. she does not know fathers side fh- adopted. moms side has no cancers at all. she has no mass or dc. she has 1.5 cm area of calcs in luoq and then stable calcs near prior excision. she had the new area biopsied and this is hg dcis that is er/pr positive. she has not had other area biopsied. she is here to discuss options  Past Surgical History [name], MD; 01/13/2019 9:00 AM) Breast Biopsy  Left. Tonsillectomy   Allergies [name], MD; 01/13/2019 9:00 AM) Wellbutrin *ANTIDEPRESSANTS*   Medication History [name], MD; 01/13/2019 9:00 AM) Sertraline HCl (50MG  Tablet, Oral) Active. Medications Reconciled PROzac (20MG  Capsule, Oral) Active. Multiple Vitamin (1 (one) Oral) Active.  Social History [name], MD; 01/13/2019 9:00 AM) Alcohol use  Occasional alcohol use. Caffeine use  Carbonated beverages. No drug use  Tobacco use  Never smoker.  Family History [name], MD; 01/13/2019 9:00 AM) Heart Disease  Brother, Father. Heart disease in female family member before age 53  Hypertension  Mother.  Vitals (Stephanie [name] CMA; 01/13/2019 8:12 AM) 01/13/2019 8:11 AM Weight: 149 lb Height: 63in Body Surface Area: 1.71 m Body Mass Index: 26.39 kg/m  Pulse: 98 (Regular)  BP: 110/70(Sitting, Left Arm, Standard)  Physical Exam [name] MD; 01/13/2019 8:54 AM) General Mental Status-Alert. Orientation-Oriented X3. Head and Neck Trachea-midline. Eye Sclera/Conjunctiva - Bilateral-No scleral icterus. Chest and Lung Exam Chest and lung exam reveals -quiet, even and easy respiratory effort with no use of accessory muscles. Breast Nipples-No Discharge. Breast Lump-No Palpable Breast Mass. Cardiovascular Cardiovascular  examination reveals -normal heart sounds, regular rate and rhythm with no murmurs. Abdomen Note: soft nontender Neurologic Neurologic evaluation reveals -alert and oriented x 3 with no impairment of recent or remote memory. Lymphatic Head & Neck General Head & Neck Lymphatics: Bilateral - Description - Normal. Axillary General Axillary Region: Bilateral - Description - Normal. Note: no [place] adenopathy   Assessment & Plan [name] MD; 01/13/2019 8:59 AM)  BREAST NEOPLASM, TIS (DCIS), LEFT (D05.12) Left breast seed guided lumpectomy, left breast seed guided excisional biopsy we discused staging and pathophysiology of breast cancer. we discussed noninvasive disease. the high grade nature precludes any observation on study. we discussed with dcis that she would not need sentinel node biopsy she does need the additional calcs biopsied. I do think an mri is reasonable given her high risk nature and dense breasts. will await all this and then determine surgery We discussed the options for treatment of the breast cancer which included lumpectomy versus a mastectomy. We discussed the performance of the lumpectomy with radioactive seed placement. We discussed a 5-10% chance of a positive margin requiring reexcision in the operating room. We also discussed that she will likely need radiation therapy if she undergoes lumpectomy. The breast cannot undergo more radiation therapy in the same breast after lumpectomy in the future. We discussed mastectomy and the postoperative care for that as well. Mastectomy can be followed by reconstruction. The decision for lumpectomy vs mastectomy has no impact on decision for chemotherapy. Most mastectomy patients will not need radiation therapy. We discussed that there is no difference in her survival whether she undergoes lumpectomy with radiation therapy or antiestrogen therapy versus a mastectomy. There is also no real difference between her recurrence in  the  breast.

## 2019-02-09 NOTE — Discharge Instructions (Signed)
[facility] Office Phone Number [phone]  BREAST BIOPSY/ PARTIAL MASTECTOMY: POST OP INSTRUCTIONS Take 400 mg of ibuprofen every 8 hours or 650 mg tylenol every 6 hours for next 72 hours then as needed. Use ice several times daily also. Always review your discharge instruction sheet given to you by the facility where your surgery was performed.  IF YOU HAVE DISABILITY OR FAMILY LEAVE FORMS, YOU MUST BRING THEM TO THE OFFICE FOR PROCESSING.  DO NOT GIVE THEM TO YOUR DOCTOR.  1. A prescription for pain medication may be given to you upon discharge.  Take your pain medication as prescribed, if needed.  If narcotic pain medicine is not needed, then you may take acetaminophen (Tylenol), naprosyn (Alleve) or ibuprofen (Advil) as needed. 2. Take your usually prescribed medications unless otherwise directed 3. If you need a refill on your pain medication, please contact your pharmacy.  They will contact our office to request authorization.  Prescriptions will not be filled after 5pm or on week-ends. 4. You should eat very light the first 24 hours after surgery, such as soup, crackers, pudding, etc.  Resume your normal diet the day after surgery. 5. Most patients will experience some swelling and bruising in the breast.  Ice packs and a good support bra will help.  Wear the breast binder provided or a sports bra for 72 hours day and night.  After that wear a sports bra during the day until you return to the office. Swelling and bruising can take several days to resolve.  6. It is common to experience some constipation if taking pain medication after surgery.  Increasing fluid intake and taking a stool softener will usually help or prevent this problem from occurring.  A mild laxative (Milk of Magnesia or Miralax) should be taken according to package directions if there are no bowel movements after 48 hours. 7. Unless discharge instructions indicate otherwise, you may remove your bandages 48  hours after surgery and you may shower at that time.  You may have steri-strips (small skin tapes) in place directly over the incision.  These strips should be left on the skin for 7-10 days and will come off on their own.  If your surgeon used skin glue on the incision, you may shower in 24 hours.  The glue will flake off over the next 2-3 weeks.  Any sutures or staples will be removed at the office during your follow-up visit. 8. ACTIVITIES:  You may resume regular daily activities (gradually increasing) beginning the next day.  Wearing a good support bra or sports bra minimizes pain and swelling.  You may have sexual intercourse when it is comfortable. a. You may drive when you no longer are taking prescription pain medication, you can comfortably wear a seatbelt, and you can safely maneuver your car and apply brakes. b. RETURN TO WORK:  ______________________________________________________________________________________ 9. You should see your doctor in the office for a follow-up appointment approximately two weeks after your surgery.  Your doctor's nurse will typically make your follow-up appointment when she calls you with your pathology report.  Expect your pathology report 3-4 business days after your surgery.  You may call to check if you do not hear from us after three days. 10. OTHER INSTRUCTIONS: _______________________________________________________________________________________________ _____________________________________________________________________________________________________________________________________ _____________________________________________________________________________________________________________________________________ _____________________________________________________________________________________________________________________________________  WHEN TO CALL DR Jasara Corrigan: 1. Fever over 101.0 2. Nausea and/or vomiting. 3. Extreme swelling or  bruising. 4. Continued bleeding from incision. 5. Increased pain, redness, or drainage from the incision.  The clinic   staff is available to answer your questions during regular business hours.  Please don't hesitate to call and ask to speak to one of the nurses for clinical concerns.  If you have a medical emergency, go to the nearest emergency room or call 911.  A surgeon from [facility] is always on call at the hospital.  For further questions, please visit [email or web address]  

## 2019-02-09 NOTE — Anesthesia Procedure Notes (Signed)
Procedure Name: LMA Insertion Date/Time: 02/09/2019 11:13 AM Performed by: Kyung Rudd, CRNA Pre-anesthesia Checklist: Patient identified, Emergency Drugs available, Suction available and Patient being monitored Patient Re-evaluated:Patient Re-evaluated prior to induction Oxygen Delivery Method: Circle system utilized Preoxygenation: Pre-oxygenation with 100% oxygen Induction Type: IV induction LMA: LMA inserted LMA Size: 4.0 Number of attempts: 1 Placement Confirmation: positive ETCO2 and breath sounds checked- equal and bilateral Tube secured with: Tape Dental Injury: Teeth and Oropharynx as per pre-operative assessment

## 2019-02-09 NOTE — Anesthesia Procedure Notes (Signed)
Anesthesia Regional Block: Pectoralis block   Pre-Anesthetic Checklist: ,, timeout performed, Correct Patient, Correct Site, Correct Laterality, Correct Procedure, Correct Position, site marked, Risks and benefits discussed,  Surgical consent,  Pre-op evaluation,  At surgeon's request and post-op pain management  Laterality: Left and N/A  Prep: chloraprep       Needles:  Injection technique: Single-shot  Needle Type: Echogenic Stimulator Needle     Needle Length: 9cm  Needle Gauge: 21   Needle insertion depth: 2 cm   Additional Needles:   Procedures:,,,, ultrasound used (permanent image in chart),,,,  Narrative:  Start time: 02/09/2019 9:35 AM End time: 02/09/2019 9:45 AM Injection made incrementally with aspirations every 5 mL.  Performed by: Personally  Anesthesiologist: Lyn Hollingshead, MD

## 2019-02-09 NOTE — Transfer of Care (Signed)
Immediate Anesthesia Transfer of Care Note  Patient: Stephanie [name]  Procedure(s) Performed: LEFT BREAST LUMPECTOMY WITH RADIOACTIVE SEED LOCALIZATION (Left Breast) RADIOACTIVE SEED GUIDED EXCISIONAL LEFT  BREAST BIOPSY (Left Breast)  Patient Location: PACU  Anesthesia Type:GA combined with regional for post-op pain  Level of Consciousness: awake, alert  and oriented  Airway & Oxygen Therapy: Patient Spontanous Breathing and Patient connected to nasal cannula oxygen  Post-op Assessment: Report given to RN, Post -op Vital signs reviewed and stable and Patient moving all extremities X 4  Post vital signs: Reviewed and stable  Last Vitals:  Vitals Value Taken Time  BP 129/80 02/09/19 1229  Temp    Pulse 82 02/09/19 1230  Resp 13 02/09/19 1230  SpO2 100 % 02/09/19 1230  Vitals shown include unvalidated device data.  Last Pain:  Vitals:   02/09/19 0903  TempSrc: Oral  PainSc:          Complications: No apparent anesthesia complications

## 2019-02-09 NOTE — Anesthesia Postprocedure Evaluation (Signed)
Anesthesia Post Note  Patient: KYNSLEI SANTAROSA  Procedure(s) Performed: LEFT BREAST LUMPECTOMY WITH RADIOACTIVE SEED LOCALIZATION (Left Breast) RADIOACTIVE SEED GUIDED EXCISIONAL LEFT  BREAST BIOPSY (Left Breast)     Patient location during evaluation: PACU Anesthesia Type: General Level of consciousness: awake Pain management: pain level controlled Vital Signs Assessment: post-procedure vital signs reviewed and stable Respiratory status: spontaneous breathing Cardiovascular status: stable Postop Assessment: no apparent nausea or vomiting Anesthetic complications: no    Last Vitals:  Vitals:   02/09/19 1240 02/09/19 1244  BP:  121/73  Pulse: 89 80  Resp: 15 17  Temp:    SpO2: 100% 100%    Last Pain:  Vitals:   02/09/19 1259  TempSrc:   PainSc: 1    Pain Goal: Patients Stated Pain Goal: 1 (02/09/19 1259)                 Huston Foley

## 2019-02-09 NOTE — Interval H&P Note (Signed)
History and Physical Interval Note:  02/09/2019 10:42 AM  Stephanie [name]  has presented today for surgery, with the diagnosis of LEFT BREAST DCIS.  The various methods of treatment have been discussed with the patient and family. After consideration of risks, benefits and other options for treatment, the patient has consented to  Procedure(s): LEFT BREAST LUMPECTOMY WITH RADIOACTIVE SEED LOCALIZATION (Left) RADIOACTIVE SEED GUIDED EXCISIONAL LEFT  BREAST BIOPSY (Left) as a surgical intervention.  The patient's history has been reviewed, patient examined, no change in status, stable for surgery.  I have reviewed the patient's chart and labs.  Questions were answered to the patient's satisfaction.     [name]

## 2019-02-10 ENCOUNTER — Encounter (HOSPITAL_COMMUNITY): Payer: Self-pay | Admitting: General Surgery

## 2019-02-13 LAB — SURGICAL PATHOLOGY

## 2019-02-14 ENCOUNTER — Encounter: Payer: Self-pay | Admitting: *Deleted

## 2019-02-17 ENCOUNTER — Other Ambulatory Visit (HOSPITAL_COMMUNITY): Payer: 59

## 2019-02-18 ENCOUNTER — Telehealth: Payer: Self-pay | Admitting: General Surgery

## 2019-02-18 NOTE — Telephone Encounter (Signed)
Pt called due to swelling.  She has had three lumpectomies and has seroma.   Have sent message to nursing staff to get appointment Monday.

## 2019-02-24 NOTE — Progress Notes (Signed)
Location of Breast Cancer: Left Breast  Histology per Pathology Report:  01/06/19 Diagnosis Breast, left, needle core biopsy, 2 o'clock - DUCTAL CARCINOMA IN SITU  Receptor Status: ER(100%), PR (80%)  02/09/19 FINAL MICROSCOPIC DIAGNOSIS: A. BREAST, LEFT, LUMPECTOMY: - Fibrocystic changes. - Healing biopsy site. - There is no evidence of malignancy. B. RADIOACTIVE SEED, REMOVAL: - Gross diagnosis only: Localization seed. C. 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 - Lobular neoplasia (atypical lobular hyperplasia). - The surgical resection margins are negative for carcinoma. - See oncology table below. D. BREAST, LEFT ADDITIONAL POSTERIOR MARGIN, EXCISION: - Fibrocystic changes with adenosis and calcifications. - There is no evidence of malignancy. E. BREAST, LEFT ADDITIONAL INFERIOR MARGIN, EXCISION: - Lobular neoplasia (atypical lobular hyperplasia). - Fibrocystic changes with adenosis and calcifications  Did patient present with symptoms or was this found on screening mammography?: It was discovered on a screening mammogram.   Past/Anticipated interventions by surgeon, if any: 02/09/19 Procedure:Left breast seed guided excisional biopsy Left breast seed guided lumpectomy Surgeon: Dr Serita Grammes  She will see him again on 02/07/19 for evaluation of seroma. She did have the area drained about a week and a half ago.   Past/Anticipated interventions by medical oncology, if any:  02/28/19 Dr. Jana Hakim PLAN: -I spent approximately 60 minutes face to face with [facility] with more than 50% of that time spent in counseling and coordination of care. Specifically we reviewed the biology of the patient's diagnosis and the specifics of her situation.  Stephanie [name] understands that in noninvasive ductal carcinoma, also called ductal carcinoma in situ ("DCIS") the breast cancer cells remain trapped in the ducts were they  started. They cannot travel to a vital organ. For that reason these cancers in themselves are not life-threatening. -If the whole breast is removed then all the ducts are removed and since the cancer cells are trapped in the ducts, the cure rate with mastectomy for noninvasive breast cancer is approximately 99%. Nevertheless we recommended lumpectomy, because there is no survival advantage to mastectomy and because the cosmetic result is generally superior with breast conservation. -Since the patient is kept her breast, there will be some risk of recurrence. The recurrence can only be in the same breast since, again, the cells are trapped in the ducts. There is no connection from one breast to the other. The risk of local recurrence is cut by more than half with radiation, which is standard in this situation. -In estrogen receptor positive cancers like Analyah's, anti-estrogens can also be considered. They will further reduce the risk of recurrence by one half. In addition anti-estrogens will lower the risk of a new breast cancer developing in either breast, also by one half. That risk otherwise approaches 1% per year.  -Millie had many questions including whether she could avoid radiation so that if she has a further recurrence in the left breast she could avoid a mastectomy.  She also wondered if she did have radiation and did have a recurrence whether she would absolutely need to have a mastectomy.  We discussed the current standard of care which is our recommendation and certainly mine.  After this discussion she is agreeable to proceeding with radiation and then starting antiestrogens. -We discussed tamoxifen versus anastrozole in detail.  She is very interested in tamoxifen and also would be interested in using Estring.  She has a good understanding of the possible toxicities complications and side effects of these medications.  She is  also interested in participating in our pelvic rehabilitation program.   I have placed all those orders in for her.  She will start tamoxifen in January and return to see me early March.   Lymphedema issues, if any:  She reports swelling after surgery which has improved. She has good arm mobility.   Pain issues, if any: She denies  SAFETY ISSUES:  Prior radiation? No  Pacemaker/ICD? No  Possible current pregnancy? No. She is not having periods.   Is the patient on methotrexate? No  Current Complaints / other details:   08/15/18 Procedure: Left breast seed localized lumpectomy Surgeon: Erroll Luna M.D. 01/09/2017 Procedure: Left breast seed guided lumpectomy Surgeon: Dr. Serita Grammes    Vaughn Frieze, Stephani Police, RN 02/24/2019,3:43 PM

## 2019-02-27 NOTE — Progress Notes (Signed)
[place]  [phone] [phone]     ID: Stephanie [name] DOB: 1965-12-27  MR#: [id number]  WCB#:[id number]  Patient Care Team: [name], MD as PCP - General (Family Medicine) Kennith Center, RD as Dietitian (Family Medicine) Donnamae Jude, MD as Consulting Physician (Obstetrics and Gynecology) Merica Prell, Virgie Dad, MD as Consulting Physician (Oncology) [name], MD as Consulting Physician (General Surgery) Eppie Gibson, MD as Attending Physician (Radiation Oncology) Chauncey Cruel, MD OTHER MD:  CHIEF COMPLAINT: Ductal carcinoma in situ  CURRENT TREATMENT: Awaiting adjuvant radiation   HISTORY OF CURRENT ILLNESS: Stephanie [name] has a history of left breast lumpectomy ([id number]) performed on 08/15/2015 for fibroscystic changes with calcifications. She underwent a second left breast lumpectomy on 12/30/2016 [phone]) for lobular neoplasia, complex sclerosing lesion, and fibrocystic change and sclerosing adenosis with calcifications.  She had routine screening mammography on 12/14/2017 showing a possible abnormality in the left breast. She underwent left diagnostic mammogram on 01/27/2018, which showed: breast density category C; probably benign left breast calcifications, most likely fat necrosis (measurements not given). Short term follow up was recommended.  She returned for follow up on 09/29/2018 (likely delayed due to the pandemic) and underwent diagnostic left mammogram. This showed: breast density category C; unchanged 1.5 cm group of calcifications at the lumpectomy site; 1.2 cm group of calcifications within the anterior upper-outer left breast. Short term follow up was again recommended.  She bilateral diagnostic mammography with tomography at [facility] on 01/03/2019 showing: breast density category C; stable probably benign calcifications at the left lumpectomy site; 1.5 cm grouped calcifications within the  upper-outer quadrant of the left breast with suspicious morphology and distribution; no evidence of right breast malignancy.   Accordingly on 01/06/2019 she proceeded to biopsy of the left breast area in question. The pathology from this procedure ([id number]) showed: ductal carcinoma in situ, high grade. Prognostic indicators significant for: estrogen receptor, 100% positive and progesterone receptor, 80% positive, both with strong staining intensity.   She underwent genetic counseling on 01/16/2019, which showed negative results.  She also underwent biopsy of the calcifications at the left lumpectomy site on 01/17/2019. Pathology [id number]) showed: focal fibrocystic changes; dense fibrosis with pigmented histiocytes and foreign body giant cells.  She opted to proceed with left breast lumpectomy on 02/09/2019 under Dr. Donne Hazel. Pathology from the procedure ([id number]) showed:  1. Left Breast, lumpectomy  - fibrocystic changes 2. Left Breast, lumpectomy  - ductal carcinoma in situ with calcifications, intermediate grade, spanning 2.4 cm  - lobular neoplasia  -negative resection margins   The patient's subsequent history is as detailed below.   INTERVAL HISTORY: Stephanie [name] was evaluated in the breast cancer clinic on 02/28/2019. Her case was also presented at the multidisciplinary breast cancer conference on 01/18/2019. At that time a preliminary plan was proposed: Genetics testing, consider an MRI prior to surgery, radiation and antiestrogens   REVIEW OF SYSTEMS: There were no specific symptoms leading to the original mammogram, which was routinely scheduled. The patient denies unusual headaches, visual changes, nausea, vomiting, stiff neck, dizziness, or gait imbalance. There has been no cough, phlegm production, or pleurisy, no chest pain or pressure, and no change in bowel or bladder habits. The patient denies fever, rash, bleeding, unexplained fatigue or unexplained weight loss. A  detailed review of systems was otherwise entirely negative.   PAST MEDICAL HISTORY: Past Medical History:  Diagnosis Date   Anxiety    Breast mass, left  Cancer ([place])    Complication of anesthesia    felt lethargic x 4 days following last surgery in 2017, but 2018 did okay   Depression    Headache    migraines   Osteopenia     PAST SURGICAL HISTORY: Past Surgical History:  Procedure Laterality Date   BREAST BIOPSY Left 07/10/2015   high risk stereo   BREAST EXCISIONAL BIOPSY Left 08/15/2015   ADH   BREAST LUMPECTOMY WITH RADIOACTIVE SEED LOCALIZATION Left 08/15/2015   Procedure:  LEFT BREAST LUMPECTOMY WITH RADIOACTIVE SEED LOCALIZATION;  Surgeon: Erroll Luna, MD;  Location: [place];  Service: General;  Laterality: Left;   BREAST LUMPECTOMY WITH RADIOACTIVE SEED LOCALIZATION Left 02/09/2019   Procedure: LEFT BREAST LUMPECTOMY WITH RADIOACTIVE SEED LOCALIZATION;  Surgeon: [name], MD;  Location: [place];  Service: General;  Laterality: Left;   EXCISION OF BREAST BIOPSY Left 12/30/2016   Benign ALH sclerosing lesion   RADIOACTIVE SEED GUIDED EXCISIONAL BREAST BIOPSY Left 12/30/2016   Procedure: RADIOACTIVE SEED GUIDED EXCISIONAL LEFT BREAST BIOPSY;  Surgeon: [name], MD;  Location: [place];  Service: General;  Laterality: Left;   RADIOACTIVE SEED GUIDED EXCISIONAL BREAST BIOPSY Left 02/09/2019   Procedure: RADIOACTIVE SEED GUIDED EXCISIONAL LEFT  BREAST BIOPSY;  Surgeon: [name], MD;  Location: [place];  Service: General;  Laterality: Left;   TONSILLECTOMY  Age 20   TRIGGER FINGER RELEASE  2010   Right Thumb    FAMILY HISTORY: Family History  Adopted: Yes  Problem Relation Age of Onset   Depression Brother    Atrial fibrillation Brother    Hypertension Mother    Osteoporosis Mother    ADD / ADHD Son    ADD / ADHD Daughter    Diabetes Maternal Grandmother    Diabetes Maternal  Grandfather    Diabetes Paternal Grandmother    Diabetes Paternal Grandfather    Parkinson's disease Maternal Aunt    Patient's father was 50 years old when he died from complications of cardiomyopathy.  He was adopted and there is no family history on his side. Patient's mother is 49 years old as of November 2020. The patient has 3 brothers, no sisters.  She denies/or notes a family hx of breast, prostate, pancreatic or ovarian cancer.    GYNECOLOGIC HISTORY:  Patient's last menstrual period was 01/25/2014 (approximate). Menarche: 53 years old Age at first live birth: Any 53 years old GX P 2 LMP 49 Contraceptive oral contraceptives at least 7 years, with no complications HRT 9 months, discontinued because of malaise  Hysterectomy? no BSO?  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  She is currently doing preadmissions at the new [facility].  Her husband Stephanie [name] works in Radio producer..  Daughter Stephanie [name], 23, lives in [place] and works as a Immunologist.  Son Stephanie [name], [place], with Asperger's, currently attends [employer], and lives at home with the patient.    ADVANCED DIRECTIVES: In the absence of any documents to the contrary the patient's husband is her healthcare power of attorney   HEALTH MAINTENANCE: Social History   Tobacco Use   Smoking status: Never Smoker   Smokeless tobacco: Never Used  Substance Use Topics   Alcohol use: No    Alcohol/week: 1.0 standard drinks    Types: 1 Standard drinks or equivalent per week   Drug use: No  Colonoscopy: not on file  PAP: 08/2017, negative  Bone density: 11/2017, -2.0   Allergies  Allergen Reactions   Wellbutrin Xl [Bupropion] Hives    Current Outpatient Medications  Medication Sig Dispense Refill   alendronate (FOSAMAX) 70 MG tablet TAKE 1 TABLET BY MOUTH ONCE A WEEK. TAKE WITH A FULL GLASS OF WATER ON AN EMPTY STOMACH 12 tablet  3   Biotin w/ Vitamins C & E (HAIR/SKIN/NAILS PO) Take 1 tablet by mouth daily.     Calcium Carb-Cholecalciferol (CALCIUM + D3 PO) Take 1 tablet by mouth daily.     sertraline (ZOLOFT) 50 MG tablet Take 1 tablet (50 mg total) by mouth daily. 90 tablet 3   No current facility-administered medications for this visit.     OBJECTIVE: Middle-aged white woman in no acute distress Vitals:   02/28/19 1550  BP: 134/70  Pulse: 81  Resp: 18  Temp: 98.5 F (36.9 C)  SpO2: 100%     Body mass index is 26.47 kg/m.   Wt Readings from Last 3 Encounters:  02/28/19 149 lb 6.4 oz (67.8 kg)  02/09/19 148 lb 4.8 oz (67.3 kg)  02/02/19 148 lb 4.8 oz (67.3 kg)      ECOG FS:0 - Asymptomatic  Ocular: Sclerae unicteric, pupils round and equal Ear-nose-throat: Wearing a mask Lymphatic: No cervical or supraclavicular adenopathy Lungs no rales or rhonchi Heart regular rate and rhythm Abd soft, nontender, positive bowel sounds MSK no focal spinal tenderness, no joint edema Neuro: non-focal, well-oriented, appropriate affect Breasts: The right breast is unremarkable.  The left breast is status post recent lumpectomy.  The incision is healing nicely.  There is a palpable seroma in the superior aspect of the breast not associated with erythema or tenderness.  Both axillae are benign.  LAB RESULTS:  CMP     Component Value Date/Time   NA 138 02/02/2019 1000   NA 140 09/06/2017 1533   K 4.2 02/02/2019 1000   CL 101 02/02/2019 1000   CO2 25 02/02/2019 1000   GLUCOSE 138 (H) 02/02/2019 1000   BUN 13 02/02/2019 1000   BUN 14 09/06/2017 1533   CREATININE 0.83 02/02/2019 1000   CALCIUM 9.5 02/02/2019 1000   PROT 6.7 09/06/2017 1533   ALBUMIN 4.3 09/06/2017 1533   AST 31 09/06/2017 1533   ALT 17 09/06/2017 1533   ALKPHOS 78 09/06/2017 1533   BILITOT <0.2 09/06/2017 1533   GFRNONAA >60 02/02/2019 1000   GFRAA >60 02/02/2019 1000    No results found for: TOTALPROTELP, ALBUMINELP, A1GS, A2GS,  BETS, BETA2SER, GAMS, MSPIKE, SPEI  No results found for: KPAFRELGTCHN, LAMBDASER, KAPLAMBRATIO  Lab Results  Component Value Date   WBC 6.7 02/02/2019   HGB 13.4 02/02/2019   HCT 39.7 02/02/2019   MCV 91.7 02/02/2019   PLT 309 02/02/2019    @LASTCHEMISTRY @  No results found for: LABCA2  No components found for: [id number]  No results for input(s): INR in the last 168 hours.  No results found for: LABCA2  No results found for: FWY637  No results found for: CHY850  No results found for: YDX412  No results found for: CA2729  No components found for: HGQUANT  No results found for: CEA1 / No results found for: CEA1   No results found for: AFPTUMOR  No results found for: CHROMOGRNA  No results found for: PSA1  No visits with results within 3 Day(s) from this visit.  Latest known visit with results is:  Admission on 02/09/2019,  Discharged on 02/09/2019  Component Date Value Ref Range Status   SURGICAL PATHOLOGY 02/09/2019    Final-Edited                   Value:SURGICAL PATHOLOGY CASE: [id number] PATIENT: [facility] Surgical Pathology Report     Clinical History: left breast DCIS (cm)     FINAL MICROSCOPIC DIAGNOSIS:  A. BREAST, LEFT, LUMPECTOMY: - Fibrocystic changes. - Healing biopsy site. - There is no evidence of malignancy.  B. RADIOACTIVE SEED, REMOVAL: - Gross diagnosis only: Localization seed.  C. 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 - Lobular neoplasia (atypical lobular hyperplasia). - The surgical resection margins are negative for carcinoma. - See oncology table below.  D. BREAST, LEFT ADDITIONAL POSTERIOR MARGIN, EXCISION: - Fibrocystic changes with adenosis and calcifications. - There is no evidence of malignancy.  E. BREAST, LEFT ADDITIONAL INFERIOR MARGIN, EXCISION: - Lobular neoplasia (atypical lobular hyperplasia). - Fibrocystic changes with  adenosis and calcifications.  ONCOLOGY TABLE:  C. DCIS OF THE BREAS                         T:  Resection  Procedure: Seed localized lumpectomy Specimen Laterality: Left Size of DCIS: 2.4 cm Histologic Type: Ductal carcinoma in situ Nuclear Grade: Intermediate grade Necrosis: Focal Margins: Greater than 0.2 cm to all margins. Regional Lymph Nodes: No regional lymph nodes examined Breast Biomarker Testing Performed on Previous Biopsy:      Testing performed on Case Number: [id number]      Estrogen Receptor: 100% strong      Progesterone Receptor: 80% strong Representative Tumor Block: C4 Pathologic Stage Classification (pTNM, AJCC 8th Edition): pTis, pNX Comments: None  (v4.3.0.2)    GROSS DESCRIPTION:  A: Specimen type: Left breast seed localization lumpectomy, received fresh.  The specimen is placed in formalin at 12:40 PM on 02/09/2019. Size: 3.1 cm at the superior-inferior axis, 2.7 cm at the anterior-posterior axis and 1 cm at the lateral-medial axis. Orientation: The specimen is oriented with previously applied inks (anterior green, infer                         ior blue, lateral orange, medial yellow, posterior black, superior red). Localized area: There is a pin inserted in the specimen identifying the biopsy clip.  The localization seed is submitted separate as part B. Cut surface: At the localized area there is a focally disrupted and hemorrhagic area without a discrete mass present.  There is an X shaped biopsy clip present.  The orientation anxious focally tracked into the previous biopsy site.  The remainder of the breast tissue consists of soft yellow adipose tissue and focally dense white fibrous tissue. Margins: The margins are inked as previously described. Prognostic indicators: Obtained from paraffin blocks if needed. Block summary: The specimen is entirely submitted in 8 cassettes. 1-3 = sections at biopsy clip 4-8 = remainder of specimen  B:  The specimen consists of the localization seed.  C: Specimen type: Left breast seed localization lumpectomy, received fresh.  The specimen is placed in formalin at 12:40 P                         M on 02/09/2019. Size: 4.5 cm at the lateral-medial axis, 3.7 cm at the superior-inferior axis and 3.1 cm at the anterior-posterior axis. Orientation: The specimen is  oriented with previously applied inks (anterior green, inferior blue, lateral orange, medial yellow, posterior black, superior red). Localized area: There are pins inserted in the specimen identifying the localization seed and biopsy clip.  The localization seed is identified at the time of receipt. Cut surface: At the localized area there is an ill-defined area of hemorrhage with a coil-shaped biopsy clip present consistent with a previous biopsy site.  A discrete mass is not grossly identified.  The remainder of the breast tissue consists of soft yellow adipose tissue and focally dense white fibrous tissue. Margins: The biopsy clip is located 0.5 cm from the inferior margin and the hemorrhage focally extends to the inferior margin. Prognostic indicators: Obtained from paraffin blocks if needed Block sum                         mary: 8 blocks submitted 1-6 = entire biopsy site and surrounding tissue to include the inferior, anterior and posterior margins 7 = superior margin 8 = lateral medial margins  D: Received fresh and placed in formalin at 12:40 PM on 02/09/2019 is a 3.0 x 2.2 x 1.7 cm portion of fibroadipose tissue clinically identified as left breast additional posterior margin.  The specimen is oriented with a short suture at superior, long suture at lateral and double suture at posterior.  The new posterior margin is inked black prior to sectioning.  The cut surfaces consist of dense white fibrous tissue and soft yellow adipose tissue.  Tumor is not grossly identified.  The specimen is entirely submitted in 7  cassettes.  E: Received fresh and placed in formalin at 12:40 PM on 02/09/2019 is a 4.2 x 3.5 x 1.8 similar cm portion of fibroadipose tissue clinically identified as left breast additional inferior margin.  The specimen is oriented with a short suture at superior, long suture                          at lateral and double suture at posterior.  The new inferior margin is inked black prior to sectioning.  The cut surfaces consist of dense white fibrous tissue and soft yellow adipose tissue.  Tumor is not grossly identified. Sections are submitted in 10 cassettes.  Final Diagnosis performed by Enid Cutter, MD.   Electronically signed 02/13/2019 Technical component performed at [facility]. [facility], [place] [address], [place], [place] [address].  Professional component performed at [facility], [place] [address] [place], [place] [address].  Immunohistochemistry Technical component (if applicable) was performed at [facility]. [address], [place], [place], [place] [address].   IMMUNOHISTOCHEMISTRY DISCLAIMER (if applicable): Some of these immunohistochemical stains may have been developed and the performance characteristics determine by [facility]. Some may not have been cleared or approved by the U.S. Food a                         nd Insurance risk surveyor. The FDA has determined that such clearance or approval is not necessary. This test is used for clinical purposes. It should not be regarded as investigational or for research. This laboratory is certified under the [place] (CLIA-88) as qualified to perform high complexity clinical laboratory testing.  The controls stained appropriately.     (this displays the last labs from the last 3 days)  No results found for: TOTALPROTELP, ALBUMINELP, A1GS,  A2GS, BETS, BETA2SER, GAMS, MSPIKE, SPEI (this displays SPEP labs)  No results found  for: KPAFRELGTCHN, LAMBDASER, KAPLAMBRATIO (kappa/lambda light chains)  No results found for: HGBA, HGBA2QUANT, HGBFQUANT, HGBSQUAN (Hemoglobinopathy evaluation)   No results found for: LDH  No results found for: IRON, TIBC, IRONPCTSAT (Iron and TIBC)  No results found for: FERRITIN  Urinalysis    Component Value Date/Time   LABSPEC 1.025 09/09/2009 1037   PHURINE 6.0 09/09/2009 1037   GLUCOSEU NEGATIVE 09/09/2009 1037   HGBUR TRACE (A) 09/09/2009 1037   BILIRUBINUR NEGATIVE 09/09/2009 1037   KETONESUR NEGATIVE 09/09/2009 1037   PROTEINUR NEGATIVE 09/09/2009 1037   UROBILINOGEN 0.2 09/09/2009 1037   NITRITE NEGATIVE 09/09/2009 1037   LEUKOCYTESUR  09/09/2009 1037    NEGATIVE Biochemical Testing Only. Please order routine urinalysis from main lab if confirmatory testing is needed.     STUDIES: Mm Breast Surgical Specimen  Result Date: 02/09/2019 CLINICAL DATA:  Evaluate specimen EXAM: SPECIMEN RADIOGRAPH OF THE LEFT BREAST COMPARISON:  Previous exam(s). FINDINGS: Status post excision of the left breast. The radioactive seed and biopsy marker clip are present, completely intact, and were marked for pathology. IMPRESSION: Specimen radiograph of the left breast. Electronically Signed   By: Dorise Bullion III M.D   On: 02/09/2019 12:01   Mm Breast Surgical Specimen  Result Date: 02/09/2019 CLINICAL DATA:  Evaluate specimen EXAM: SPECIMEN RADIOGRAPH OF THE LEFT BREAST COMPARISON:  Previous exam(s). FINDINGS: Status post excision of the left breast. The radioactive seed and biopsy marker clip are present, completely intact, and were marked for pathology. IMPRESSION: Specimen radiograph of the left breast. Electronically Signed   By: Dorise Bullion III M.D   On: 02/09/2019 11:46   Mm Lt Radioactive Seed Loc Mammo Guide  Result Date: 02/08/2019 CLINICAL DATA:  Patient with biopsy-proven DCIS within the outer LEFT breast (coil shaped clip) scheduled for lumpectomy requiring  preoperative radioactive seed localization. Patient is also status post a benign biopsy at her earlier lumpectomy site within the upper-outer quadrant of the LEFT breast (X shaped clip) scheduled for same day excision requiring preoperative radioactive seed localization. EXAM: MAMMOGRAPHIC GUIDED RADIOACTIVE SEED LOCALIZATION OF THE LEFT BREAST MAMMOGRAPHIC GUIDED RADIOACTIVE SEED LOCALIZATION OF THE LEFT BREAST COMPARISON:  Previous exam(s). FINDINGS: Patient presents for radioactive seed localization prior to breast conservation surgery (2 site lumpectomies). I met with the patient and we discussed the procedure of seed localization including benefits and alternatives. We discussed the high likelihood of a successful procedure. We discussed the risks of the procedure including infection, bleeding, tissue injury and further surgery. We discussed the low dose of radioactivity involved in the procedure. Informed, written consent was given. The usual time-out protocol was performed immediately prior to the procedure. Site 1: Using mammographic guidance, sterile technique, 1% lidocaine and an I-125 radioactive seed, the coil shaped clip within the outer LEFT breast was localized using a lateral approach. The follow-up mammogram images confirm the seed in the expected location and were marked for Dr. Donne Hazel. Follow-up survey of the patient confirms presence of the radioactive seed. Order number of I-125 seed:  [id number]. Total activity:  0.131 millicuries reference Date: 01/23/2019 Site 2: Using mammographic guidance, sterile technique, 1% lidocaine and an I-125 radioactive seed, the X shaped clip near the lumpectomy site was was localized using a superior approach. The follow-up mammogram images confirm the seed in the expected location and were marked for Dr. Donne Hazel. Follow-up survey of the patient confirms presence of the radioactive seed. Order number  of I-125 seed:  [id number]. Total activity:  4.010  millicuries reference Date: 02/01/2019 The patient tolerated the procedure well and was released from the [facility]. She was given instructions regarding seed removal. IMPRESSION: Radioactive seed localization left breast (coil shaped clip within the outer LEFT breast). Radioactive seed localization left breast (X shaped clip within the upper LEFT breast adjacent to the lumpectomy site). No  apparent complications. Electronically Signed   By: Franki Cabot M.D.   On: 02/08/2019 12:33   Mm Lt Rad Seed Ea Add Lesion Loc Mammo  Result Date: 02/08/2019 CLINICAL DATA:  Patient with biopsy-proven DCIS within the outer LEFT breast (coil shaped clip) scheduled for lumpectomy requiring preoperative radioactive seed localization. Patient is also status post a benign biopsy at her earlier lumpectomy site within the upper-outer quadrant of the LEFT breast (X shaped clip) scheduled for same day excision requiring preoperative radioactive seed localization. EXAM: MAMMOGRAPHIC GUIDED RADIOACTIVE SEED LOCALIZATION OF THE LEFT BREAST MAMMOGRAPHIC GUIDED RADIOACTIVE SEED LOCALIZATION OF THE LEFT BREAST COMPARISON:  Previous exam(s). FINDINGS: Patient presents for radioactive seed localization prior to breast conservation surgery (2 site lumpectomies). I met with the patient and we discussed the procedure of seed localization including benefits and alternatives. We discussed the high likelihood of a successful procedure. We discussed the risks of the procedure including infection, bleeding, tissue injury and further surgery. We discussed the low dose of radioactivity involved in the procedure. Informed, written consent was given. The usual time-out protocol was performed immediately prior to the procedure. Site 1: Using mammographic guidance, sterile technique, 1% lidocaine and an I-125 radioactive seed, the coil shaped clip within the outer LEFT breast was localized using a lateral approach. The follow-up mammogram images  confirm the seed in the expected location and were marked for Dr. Donne Hazel. Follow-up survey of the patient confirms presence of the radioactive seed. Order number of I-125 seed:  [id number]. Total activity:  0.347 millicuries reference Date: 01/23/2019 Site 2: Using mammographic guidance, sterile technique, 1% lidocaine and an I-125 radioactive seed, the X shaped clip near the lumpectomy site was was localized using a superior approach. The follow-up mammogram images confirm the seed in the expected location and were marked for Dr. Donne Hazel. Follow-up survey of the patient confirms presence of the radioactive seed. Order number of I-125 seed:  [id number]. Total activity:  5.643 millicuries reference Date: 02/01/2019 The patient tolerated the procedure well and was released from the [place]. She was given instructions regarding seed removal. IMPRESSION: Radioactive seed localization left breast (coil shaped clip within the outer LEFT breast). Radioactive seed localization left breast (X shaped clip within the upper LEFT breast adjacent to the lumpectomy site). No  apparent complications. Electronically Signed   By: Franki Cabot M.D.   On: 02/08/2019 12:33    ELIGIBLE FOR AVAILABLE RESEARCH PROTOCOL: no  ASSESSMENT: 53 y.o. [place] woman status post left breast biopsy 01/06/2023 ductal carcinoma in situ, grade 3, estrogen and progesterone receptor positive  (a) biopsy of a second area of calcifications in the left breast on 01/17/2019 -  (1) status post left lumpectomy 02/09/2023 2.4 cm ductal carcinoma in situ, grade 2, with negative margins  (2) adjuvant radiation to follow  (3) consider adjuvant antiestrogens  (4) genetics testing 01/19/2019 through the [facility] Breast Cancer STAT Panel + Common Hereditary Cancers Panel found no deleterious mutations in ATM, BRCA1, BRCA2, CDH1, CHEK2, PALB2, PTEN, STK11 and TP53 // APC, ATM, AXIN2, BARD1, BMPR1A, BRCA1, BRCA2, BRIP1, CDH1, CDKN2A (p14ARF),  CDKN2A (  p16INK4a), CKD4, CHEK2, CTNNA1, DICER1, EPCAM (Deletion/duplication testing only), GREM1 (promoter region deletion/duplication testing only), KIT, MEN1, MLH1, MSH2, MSH3, MSH6, MUTYH, NBN, NF1, NHTL1, PALB2, PDGFRA, PMS2, POLD1, POLE, PTEN, RAD50, RAD51C, RAD51D, SDHB, SDHC, SDHD, SMAD4, SMARCA4. STK11, TP53, TSC1, TSC2, and VHL.  The following genes were evaluated for sequence changes only: SDHA and HOXB13 c.251G>A variant only.  PLAN: I spent approximately 60 minutes face to face with Beverlee with more than 50% of that time spent in counseling and coordination of care. Specifically we reviewed the biology of the patient's diagnosis and the specifics of her situation.  Saidi understands that in noninvasive ductal carcinoma, also called ductal carcinoma in situ ("DCIS") the breast cancer cells remain trapped in the ducts were they started. They cannot travel to a vital organ. For that reason these cancers in themselves are not life-threatening.  If the whole breast is removed then all the ducts are removed and since the cancer cells are trapped in the ducts, the cure rate with mastectomy for noninvasive breast cancer is approximately 99%. Nevertheless we recommended lumpectomy, because there is no survival advantage to mastectomy and because the cosmetic result is generally superior with breast conservation.  Since the patient is kept her breast, there will be some risk of recurrence. The recurrence can only be in the same breast since, again, the cells are trapped in the ducts. There is no connection from one breast to the other. The risk of local recurrence is cut by more than half with radiation, which is standard in this situation.  In estrogen receptor positive cancers like Arynn's, anti-estrogens can also be considered. They will further reduce the risk of recurrence by one half. In addition anti-estrogens will lower the risk of a new breast cancer developing in either breast, also by one  half. That risk otherwise approaches 1% per year.   Millie had many questions including whether she could avoid radiation so that if she has a further recurrence in the left breast she could avoid a mastectomy.  She also wondered if she did have radiation and did have a recurrence whether she would absolutely need to have a mastectomy.  We discussed the current standard of care which is our recommendation and certainly mine.  After this discussion she is agreeable to proceeding with radiation and then starting antiestrogens.  We discussed tamoxifen versus anastrozole in detail.  She is very interested in tamoxifen and also would be interested in using Estring.  She has a good understanding of the possible toxicities complications and side effects of these medications.  She is also interested in participating in our pelvic rehabilitation program.  I have placed all those orders in for her.  She will start tamoxifen in January and return to see me early March.  Zendaya has a good understanding of the overall plan. She agrees with it. She knows the goal of treatment in her case is cure. She will call with any problems that may develop before her next visit here.   Chauncey Cruel, MD   02/28/2019 5:42 PM Medical Oncology and Hematology [facility] [place], [place] [address] Tel. [phone]    Fax. [phone]   This document serves as a record of services personally performed by Lurline Del, MD. It was created on his behalf by Wilburn Mylar, a trained medical scribe. The creation of this record is based on the scribe's personal observations and the provider's statements to them.   Joylene Grapes Tonya Carlile  MD, have reviewed the above documentation for accuracy and completeness, and I agree with the above.

## 2019-02-28 ENCOUNTER — Inpatient Hospital Stay: Payer: 59 | Attending: Oncology | Admitting: Oncology

## 2019-02-28 ENCOUNTER — Other Ambulatory Visit: Payer: Self-pay

## 2019-02-28 ENCOUNTER — Other Ambulatory Visit: Payer: Self-pay | Admitting: *Deleted

## 2019-02-28 ENCOUNTER — Inpatient Hospital Stay: Payer: 59

## 2019-02-28 VITALS — BP 134/70 | HR 81 | Temp 98.5°F | Resp 18 | Ht 63.0 in | Wt 149.4 lb

## 2019-02-28 DIAGNOSIS — F418 Other specified anxiety disorders: Secondary | ICD-10-CM | POA: Insufficient documentation

## 2019-02-28 DIAGNOSIS — M8580 Other specified disorders of bone density and structure, unspecified site: Secondary | ICD-10-CM | POA: Insufficient documentation

## 2019-02-28 DIAGNOSIS — C50412 Malignant neoplasm of upper-outer quadrant of left female breast: Secondary | ICD-10-CM

## 2019-02-28 DIAGNOSIS — Z170 Estrogen receptor positive status [ER+]: Secondary | ICD-10-CM | POA: Insufficient documentation

## 2019-02-28 DIAGNOSIS — D0512 Intraductal carcinoma in situ of left breast: Secondary | ICD-10-CM | POA: Insufficient documentation

## 2019-02-28 DIAGNOSIS — Z79899 Other long term (current) drug therapy: Secondary | ICD-10-CM | POA: Insufficient documentation

## 2019-02-28 MED ORDER — TAMOXIFEN CITRATE 20 MG PO TABS: 20.0000 mg | ORAL_TABLET | Freq: Every day | ORAL | 12 refills | Status: AC

## 2019-02-28 MED ORDER — ESTRING 2 MG VA RING: 2.0000 mg | VAGINAL_RING | VAGINAL | 12 refills | Status: DC

## 2019-03-01 ENCOUNTER — Telehealth: Payer: Self-pay | Admitting: Oncology

## 2019-03-01 NOTE — Telephone Encounter (Signed)
I talk with patient regarding schedule  

## 2019-03-03 ENCOUNTER — Encounter: Payer: Self-pay | Admitting: Radiation Oncology

## 2019-03-03 ENCOUNTER — Ambulatory Visit
Admission: RE | Admit: 2019-03-03 | Discharge: 2019-03-03 | Disposition: A | Discharge status: Home / Self Care | Payer: 59 | Source: Ambulatory Visit | Attending: Radiation Oncology | Admitting: Radiation Oncology

## 2019-03-03 ENCOUNTER — Other Ambulatory Visit: Payer: Self-pay

## 2019-03-03 DIAGNOSIS — Z510 Encounter for antineoplastic radiation therapy: Secondary | ICD-10-CM | POA: Insufficient documentation

## 2019-03-03 DIAGNOSIS — C50412 Malignant neoplasm of upper-outer quadrant of left female breast: Secondary | ICD-10-CM

## 2019-03-03 DIAGNOSIS — D0512 Intraductal carcinoma in situ of left breast: Secondary | ICD-10-CM | POA: Diagnosis not present

## 2019-03-03 DIAGNOSIS — Z9889 Other specified postprocedural states: Secondary | ICD-10-CM | POA: Diagnosis not present

## 2019-03-03 DIAGNOSIS — L7633 Postprocedural seroma of skin and subcutaneous tissue following a dermatologic procedure: Secondary | ICD-10-CM | POA: Diagnosis not present

## 2019-03-03 DIAGNOSIS — Z170 Estrogen receptor positive status [ER+]: Secondary | ICD-10-CM | POA: Diagnosis not present

## 2019-03-03 NOTE — Progress Notes (Signed)
Radiation Oncology         [phone] ________________________________  Initial outpatient Consultation  Name: Stephanie [name] MRN: [id number]  Date: 03/03/2019  DOB: [birth year]  AL:[id number], Rebeca Alert, MD  [name], MD   REFERRING PHYSICIAN: [name], MD  DIAGNOSIS:    ICD-10-CM   1. Malignant neoplasm of upper-outer quadrant of left breast in female, estrogen receptor positive ([place])  C50.412    Z17.0    Cancer Staging Malignant neoplasm of upper-outer quadrant of left breast in female, estrogen receptor positive ([place]) Staging form: Breast, AJCC 8th Edition - Clinical stage from 01/06/2019: Stage 0 (cTis (DCIS), cN0, cM0, ER+, PR+) - Signed by Gardenia Phlegm, NP on 01/18/2019 - Pathologic stage from 02/09/2019: Stage 0 (pTis (DCIS), pN0, cM0, ER+, PR+) - Signed by Gardenia Phlegm, NP on 03/01/2019    CHIEF COMPLAINT: Here to discuss management of left breast DCIS  HISTORY OF PRESENT ILLNESS::Stephanie [name] is a 53 y.o. female with a history of two left breast lumpectomies. In 07/2015, pathology showed fibrocystic changes, and in 12/2016, pathology showed lobular neoplasia.   She presented with breast abnormality on the following imaging: diagnostic mammogram on the date of 01/03/2019.  Symptoms, if any, at that time, were: none.   This revealed 1.5cm grouped calcifications in upper-outer left breast.   Biopsy on date of 01/06/2019 showed DCIS.  ER status: positive; PR status positive; Grade 2.  She underwent genetic testing on 01/16/2019, which was negative.  She opted to proceed with left lumpectomy on date of 02/09/2019 with pathology report revealing: tumor size of 2.4 cm; histology of dcutal carcinoma in situ; margin status to in situ disease of negative; Grade 2.  Negative genetic testing. She had a seroma drained about 10 days ago.  She returns to see Dr. Donne Hazel 11/13. Swelling is much improved but she still has a seroma that is "not  large."  PREVIOUS RADIATION THERAPY: No  PAST MEDICAL HISTORY:  has a past medical history of Anxiety, Breast mass, left, Cancer ([place]), Complication of anesthesia, Depression, Headache, and Osteopenia.    PAST SURGICAL HISTORY: Past Surgical History:  Procedure Laterality Date   BREAST BIOPSY Left 07/10/2015   high risk stereo   BREAST EXCISIONAL BIOPSY Left 08/15/2015   ADH   BREAST LUMPECTOMY WITH RADIOACTIVE SEED LOCALIZATION Left 08/15/2015   Procedure:  LEFT BREAST LUMPECTOMY WITH RADIOACTIVE SEED LOCALIZATION;  Surgeon: Erroll Luna, MD;  Location: [place];  Service: General;  Laterality: Left;   BREAST LUMPECTOMY WITH RADIOACTIVE SEED LOCALIZATION Left 02/09/2019   Procedure: LEFT BREAST LUMPECTOMY WITH RADIOACTIVE SEED LOCALIZATION;  Surgeon: [name], MD;  Location: [place] [place];  Service: General;  Laterality: Left;   EXCISION OF BREAST BIOPSY Left 12/30/2016   Benign ALH sclerosing lesion   RADIOACTIVE SEED GUIDED EXCISIONAL BREAST BIOPSY Left 12/30/2016   Procedure: RADIOACTIVE SEED GUIDED EXCISIONAL LEFT BREAST BIOPSY;  Surgeon: [name], MD;  Location: [place];  Service: General;  Laterality: Left;   RADIOACTIVE SEED GUIDED EXCISIONAL BREAST BIOPSY Left 02/09/2019   Procedure: RADIOACTIVE SEED GUIDED EXCISIONAL LEFT  BREAST BIOPSY;  Surgeon: [name], MD;  Location: [place];  Service: General;  Laterality: Left;   TONSILLECTOMY  Age 67   TRIGGER FINGER RELEASE  2010   Right Thumb    FAMILY HISTORY: family history includes ADD / ADHD in her daughter and son; Atrial fibrillation in her brother; Depression in her brother; Diabetes in her maternal grandfather, maternal  grandmother, paternal grandfather, and paternal grandmother; Hypertension in her mother; Osteoporosis in her mother; Parkinson's disease in her maternal aunt. She was adopted. (CORRECTION - HER FATHER , not the patient, WAS ADOPTED).   SOCIAL  HISTORY:  reports that she has never smoked. She has never used smokeless tobacco. She reports that she does not drink alcohol or use drugs.  ALLERGIES: Wellbutrin xl [bupropion]  MEDICATIONS:  Current Outpatient Medications  Medication Sig Dispense Refill   alendronate (FOSAMAX) 70 MG tablet TAKE 1 TABLET BY MOUTH ONCE A WEEK. TAKE WITH A FULL GLASS OF WATER ON AN EMPTY STOMACH 12 tablet 3   Biotin w/ Vitamins C & E (HAIR/SKIN/NAILS PO) Take 1 tablet by mouth daily.     Calcium Carb-Cholecalciferol (CALCIUM + D3 PO) Take 1 tablet by mouth daily.     sertraline (ZOLOFT) 50 MG tablet Take 1 tablet (50 mg total) by mouth daily. 90 tablet 3   TURMERIC PO Take by mouth.     estradiol (ESTRING) 2 MG vaginal ring Place 2 mg vaginally every 3 (three) months. follow package directions (Patient not taking: Reported on 03/03/2019) 1 each 12   tamoxifen (NOLVADEX) 20 MG tablet Take 1 tablet (20 mg total) by mouth daily. Start 04/28/2019 (Patient not taking: Reported on 03/03/2019) 90 tablet 12   No current facility-administered medications for this encounter.     REVIEW OF SYSTEMS: As above   PHYSICAL EXAM:  vitals were not taken for this visit.       LABORATORY DATA:  Lab Results  Component Value Date   WBC 6.7 02/02/2019   HGB 13.4 02/02/2019   HCT 39.7 02/02/2019   MCV 91.7 02/02/2019   PLT 309 02/02/2019   CMP     Component Value Date/Time   NA 138 02/02/2019 1000   NA 140 09/06/2017 1533   K 4.2 02/02/2019 1000   CL 101 02/02/2019 1000   CO2 25 02/02/2019 1000   GLUCOSE 138 (H) 02/02/2019 1000   BUN 13 02/02/2019 1000   BUN 14 09/06/2017 1533   CREATININE 0.83 02/02/2019 1000   CALCIUM 9.5 02/02/2019 1000   PROT 6.7 09/06/2017 1533   ALBUMIN 4.3 09/06/2017 1533   AST 31 09/06/2017 1533   ALT 17 09/06/2017 1533   ALKPHOS 78 09/06/2017 1533   BILITOT <0.2 09/06/2017 1533   GFRNONAA >60 02/02/2019 1000   GFRAA >60 02/02/2019 1000         RADIOGRAPHY: Mm Breast  Surgical Specimen  Result Date: 02/09/2019 CLINICAL DATA:  Evaluate specimen EXAM: SPECIMEN RADIOGRAPH OF THE LEFT BREAST COMPARISON:  Previous exam(s). FINDINGS: Status post excision of the left breast. The radioactive seed and biopsy marker clip are present, completely intact, and were marked for pathology. IMPRESSION: Specimen radiograph of the left breast. Electronically Signed   By: Dorise Bullion III M.D   On: 02/09/2019 12:01   Mm Breast Surgical Specimen  Result Date: 02/09/2019 CLINICAL DATA:  Evaluate specimen EXAM: SPECIMEN RADIOGRAPH OF THE LEFT BREAST COMPARISON:  Previous exam(s). FINDINGS: Status post excision of the left breast. The radioactive seed and biopsy marker clip are present, completely intact, and were marked for pathology. IMPRESSION: Specimen radiograph of the left breast. Electronically Signed   By: Dorise Bullion III M.D   On: 02/09/2019 11:46   Mm Lt Radioactive Seed Loc Mammo Guide  Result Date: 02/08/2019 CLINICAL DATA:  Patient with biopsy-proven DCIS within the outer LEFT breast (coil shaped clip) scheduled for lumpectomy requiring preoperative radioactive seed  localization. Patient is also status post a benign biopsy at her earlier lumpectomy site within the upper-outer quadrant of the LEFT breast (X shaped clip) scheduled for same day excision requiring preoperative radioactive seed localization. EXAM: MAMMOGRAPHIC GUIDED RADIOACTIVE SEED LOCALIZATION OF THE LEFT BREAST MAMMOGRAPHIC GUIDED RADIOACTIVE SEED LOCALIZATION OF THE LEFT BREAST COMPARISON:  Previous exam(s). FINDINGS: Patient presents for radioactive seed localization prior to breast conservation surgery (2 site lumpectomies). I met with the patient and we discussed the procedure of seed localization including benefits and alternatives. We discussed the high likelihood of a successful procedure. We discussed the risks of the procedure including infection, bleeding, tissue injury and further surgery. We  discussed the low dose of radioactivity involved in the procedure. Informed, written consent was given. The usual time-out protocol was performed immediately prior to the procedure. Site 1: Using mammographic guidance, sterile technique, 1% lidocaine and an I-125 radioactive seed, the coil shaped clip within the outer LEFT breast was localized using a lateral approach. The follow-up mammogram images confirm the seed in the expected location and were marked for Dr. Donne Hazel. Follow-up survey of the patient confirms presence of the radioactive seed. Order number of I-125 seed:  [id number]. Total activity:  1.735 millicuries reference Date: 01/23/2019 Site 2: Using mammographic guidance, sterile technique, 1% lidocaine and an I-125 radioactive seed, the X shaped clip near the lumpectomy site was was localized using a superior approach. The follow-up mammogram images confirm the seed in the expected location and were marked for Dr. Donne Hazel. Follow-up survey of the patient confirms presence of the radioactive seed. Order number of I-125 seed:  [id number]. Total activity:  1.314 millicuries reference Date: 02/01/2019 The patient tolerated the procedure well and was released from the [facility]. She was given instructions regarding seed removal. IMPRESSION: Radioactive seed localization left breast (coil shaped clip within the outer LEFT breast). Radioactive seed localization left breast (X shaped clip within the upper LEFT breast adjacent to the lumpectomy site). No  apparent complications. Electronically Signed   By: Franki Cabot M.D.   On: 02/08/2019 12:33   Mm Lt Rad Seed Ea Add Lesion Loc Mammo  Result Date: 02/08/2019 CLINICAL DATA:  Patient with biopsy-proven DCIS within the outer LEFT breast (coil shaped clip) scheduled for lumpectomy requiring preoperative radioactive seed localization. Patient is also status post a benign biopsy at her earlier lumpectomy site within the upper-outer quadrant of the LEFT  breast (X shaped clip) scheduled for same day excision requiring preoperative radioactive seed localization. EXAM: MAMMOGRAPHIC GUIDED RADIOACTIVE SEED LOCALIZATION OF THE LEFT BREAST MAMMOGRAPHIC GUIDED RADIOACTIVE SEED LOCALIZATION OF THE LEFT BREAST COMPARISON:  Previous exam(s). FINDINGS: Patient presents for radioactive seed localization prior to breast conservation surgery (2 site lumpectomies). I met with the patient and we discussed the procedure of seed localization including benefits and alternatives. We discussed the high likelihood of a successful procedure. We discussed the risks of the procedure including infection, bleeding, tissue injury and further surgery. We discussed the low dose of radioactivity involved in the procedure. Informed, written consent was given. The usual time-out protocol was performed immediately prior to the procedure. Site 1: Using mammographic guidance, sterile technique, 1% lidocaine and an I-125 radioactive seed, the coil shaped clip within the outer LEFT breast was localized using a lateral approach. The follow-up mammogram images confirm the seed in the expected location and were marked for Dr. Donne Hazel. Follow-up survey of the patient confirms presence of the radioactive seed. Order number of I-125 seed:  [id number]. Total  activity:  6.644 millicuries reference Date: 01/23/2019 Site 2: Using mammographic guidance, sterile technique, 1% lidocaine and an I-125 radioactive seed, the X shaped clip near the lumpectomy site was was localized using a superior approach. The follow-up mammogram images confirm the seed in the expected location and were marked for Dr. Donne Hazel. Follow-up survey of the patient confirms presence of the radioactive seed. Order number of I-125 seed:  [id number]. Total activity:  6.387 millicuries reference Date: 02/01/2019 The patient tolerated the procedure well and was released from the [facility]. She was given instructions regarding seed removal.  IMPRESSION: Radioactive seed localization left breast (coil shaped clip within the outer LEFT breast). Radioactive seed localization left breast (X shaped clip within the upper LEFT breast adjacent to the lumpectomy site). No  apparent complications. Electronically Signed   By: Franki Cabot M.D.   On: 02/08/2019 12:33      IMPRESSION/PLAN: Left breast DCIS   It was a pleasure meeting the patient today. We discussed the risks, benefits, and side effects of radiotherapy. I recommend radiotherapy to the left breast to reduce her risk of locoregional recurrence by 2/3.  We discussed that radiation would take approximately 4 weeks to complete.  We spoke about acute effects including skin irritation and fatigue as well as much less common late effects including internal organ injury or irritation. We spoke about the latest technology that is used to minimize the risk of late effects for patients undergoing radiotherapy to the breast or chest wall. No guarantees of treatment were given. The patient is enthusiastic about proceeding with treatment. I look forward to participating in the patient's care.    We will proceed with CT simulation today.  I will examine her breast at that time.  We will sign her consent form at that time.  This encounter was provided by telemedicine platform Webex.  The patient has given verbal consent for this type of encounter and has been advised to only accept a meeting of this type in a secure network environment. The time spent during this encounter was 20 minutes. The attendants for this meeting include Eppie Gibson  and Ernst Bowler.  During the encounter, Eppie Gibson was located at [facility] Radiation Oncology Department.  Ernst Bowler was located at home.  __________________________________________   Eppie Gibson, MD   This document serves as a record of services personally performed by Eppie Gibson, MD. It was created on her behalf by Wilburn Mylar, a trained medical scribe. The creation of this record is based on the scribe's personal observations and the provider's statements to them. This document has been checked and approved by the attending provider.

## 2019-03-03 NOTE — Progress Notes (Signed)
Radiation Oncology         [phone] ________________________________  Name: Stephanie [name] MRN: [id number]  Date: 03/03/2019  DOB: 18-Dec-1965  SIMULATION AND TREATMENT PLANNING NOTE  //  Special treatment procedure  Outpatient  DIAGNOSIS:     ICD-10-CM   1. Malignant neoplasm of upper-outer quadrant of left breast in female, estrogen receptor positive ([place])  C50.412    Z17.0     NARRATIVE:  The patient was brought to the [facility].  Identity was confirmed.  All relevant records and images related to the planned course of therapy were reviewed.  The patient freely provided informed written consent to proceed with treatment after reviewing the details related to the planned course of therapy. The consent form was witnessed and verified by the simulation staff.    Then, the patient was set-up in a stable reproducible supine position for radiation therapy with her ipsilateral arm over her head, and her upper body secured in a custom-made Vac-lok device.  CT images were obtained.  Surface markings were placed.  The CT images were loaded into the planning software.    Special treatment procedure: Special treatment procedure was performed today due to the extra time and effort required by myself to plan and prepare this patient for deep inspiration breath hold technique.  I have determined cardiac sparing to be of benefit to this patient to prevent long term cardiac damage due to radiation of the heart.  Bellows were placed on the patient's abdomen. To facilitate cardiac sparing, the patient was coached by the radiation therapists on breath hold techniques and breathing practice was performed. Practice waveforms were obtained. The patient was then scanned while maintaining breath hold in the treatment position.  This image was then transferred over to the imaging specialist. The imaging specialist then created a fusion of the free breathing and breath hold scans using the chest  wall as the stable structure. I personally reviewed the fusion in axial, coronal and sagittal image planes.  Excellent cardiac sparing was obtained.  I felt the patient is an appropriate candidate for breath hold and the patient will be treated as such.  The image fusion was then reviewed with the patient to reinforce the necessity of reproducible breath hold.  TREATMENT PLANNING NOTE: Treatment planning then occurred.  The radiation prescription was entered and confirmed.     A total of 3 medically necessary complex treatment devices were fabricated and supervised by me: 2 fields with MLCs for custom blocks to protect heart, and lungs;  and, a Vac-lok. MORE COMPLEX DEVICES MAY BE MADE IN DOSIMETRY FOR FIELD IN FIELD BEAMS FOR DOSE HOMOGENEITY.  I have requested : 3D Simulation which is medically necessary to give adequate dose to at risk tissues while sparing lungs and heart.  I have requested a DVH of the following structures: lungs, heart, left lumpectomy.    The patient will receive 40.05 Gy in 15 fractions to the left breast with 2 tangential fields.   This will be followed by a boost.  Optical Surface Tracking Plan:  Since intensity modulated radiotherapy (IMRT) and 3D conformal radiation treatment methods are predicated on accurate and precise positioning for treatment, intrafraction motion monitoring is medically necessary to ensure accurate and safe treatment delivery. The ability to quantify intrafraction motion without excessive ionizing radiation dose can only be performed with optical surface tracking. Accordingly, surface imaging offers the opportunity to obtain 3D measurements of patient position throughout IMRT and 3D treatments without  excessive radiation exposure. I am ordering optical surface tracking for this patient's upcoming course of radiotherapy.  ________________________________   Reference:  Ursula Alert, J, et al. Surface imaging-based analysis of  intrafraction motion for breast radiotherapy patients.Journal of [place], n. 6, nov. 2014. ISSN [id number].  Available at: <http://www.jacmp.org/index.php/jacmp/article/view/4957>.    -----------------------------------  Eppie Gibson, MD

## 2019-03-06 ENCOUNTER — Encounter: Payer: Self-pay | Admitting: *Deleted

## 2019-03-10 DIAGNOSIS — Z510 Encounter for antineoplastic radiation therapy: Secondary | ICD-10-CM | POA: Diagnosis not present

## 2019-03-10 DIAGNOSIS — C50412 Malignant neoplasm of upper-outer quadrant of left female breast: Secondary | ICD-10-CM | POA: Diagnosis not present

## 2019-03-10 DIAGNOSIS — D0512 Intraductal carcinoma in situ of left breast: Secondary | ICD-10-CM | POA: Diagnosis not present

## 2019-03-13 ENCOUNTER — Ambulatory Visit
Admission: RE | Admit: 2019-03-13 | Discharge: 2019-03-13 | Disposition: A | Discharge status: Home / Self Care | Payer: 59 | Source: Ambulatory Visit | Attending: Radiation Oncology | Admitting: Radiation Oncology

## 2019-03-13 ENCOUNTER — Other Ambulatory Visit: Payer: Self-pay

## 2019-03-13 DIAGNOSIS — C50412 Malignant neoplasm of upper-outer quadrant of left female breast: Secondary | ICD-10-CM | POA: Diagnosis not present

## 2019-03-13 DIAGNOSIS — D0512 Intraductal carcinoma in situ of left breast: Secondary | ICD-10-CM | POA: Diagnosis not present

## 2019-03-13 DIAGNOSIS — Z510 Encounter for antineoplastic radiation therapy: Secondary | ICD-10-CM | POA: Diagnosis not present

## 2019-03-13 MED ORDER — SONAFINE EX EMUL
1.0000 "application " | Freq: Once | CUTANEOUS | Status: AC
  Administered 2019-03-13: 1 via TOPICAL

## 2019-03-13 MED ORDER — ALRA NON-METALLIC DEODORANT (RAD-ONC)
1.0000 "application " | Freq: Once | TOPICAL | Status: AC
  Administered 2019-03-13: 1 via TOPICAL

## 2019-03-13 NOTE — Progress Notes (Signed)
duplicate

## 2019-03-14 ENCOUNTER — Encounter: Payer: Self-pay | Admitting: *Deleted

## 2019-03-14 ENCOUNTER — Ambulatory Visit
Admission: RE | Admit: 2019-03-14 | Discharge: 2019-03-14 | Disposition: A | Discharge status: Home / Self Care | Payer: 59 | Source: Ambulatory Visit | Attending: Radiation Oncology | Admitting: Radiation Oncology

## 2019-03-14 ENCOUNTER — Other Ambulatory Visit: Payer: Self-pay

## 2019-03-14 DIAGNOSIS — Z510 Encounter for antineoplastic radiation therapy: Secondary | ICD-10-CM | POA: Diagnosis not present

## 2019-03-14 DIAGNOSIS — C50412 Malignant neoplasm of upper-outer quadrant of left female breast: Secondary | ICD-10-CM | POA: Diagnosis not present

## 2019-03-14 DIAGNOSIS — D0512 Intraductal carcinoma in situ of left breast: Secondary | ICD-10-CM | POA: Diagnosis not present

## 2019-03-15 ENCOUNTER — Other Ambulatory Visit: Payer: Self-pay

## 2019-03-15 ENCOUNTER — Ambulatory Visit
Admission: RE | Admit: 2019-03-15 | Discharge: 2019-03-15 | Disposition: A | Discharge status: Home / Self Care | Payer: 59 | Source: Ambulatory Visit | Attending: Radiation Oncology | Admitting: Radiation Oncology

## 2019-03-15 DIAGNOSIS — C50412 Malignant neoplasm of upper-outer quadrant of left female breast: Secondary | ICD-10-CM | POA: Diagnosis not present

## 2019-03-15 DIAGNOSIS — Z510 Encounter for antineoplastic radiation therapy: Secondary | ICD-10-CM | POA: Diagnosis not present

## 2019-03-15 DIAGNOSIS — D0512 Intraductal carcinoma in situ of left breast: Secondary | ICD-10-CM | POA: Diagnosis not present

## 2019-03-16 ENCOUNTER — Ambulatory Visit
Admission: RE | Admit: 2019-03-16 | Discharge: 2019-03-16 | Disposition: A | Discharge status: Home / Self Care | Payer: 59 | Source: Ambulatory Visit | Attending: Radiation Oncology | Admitting: Radiation Oncology

## 2019-03-16 ENCOUNTER — Other Ambulatory Visit: Payer: Self-pay

## 2019-03-16 DIAGNOSIS — C50412 Malignant neoplasm of upper-outer quadrant of left female breast: Secondary | ICD-10-CM | POA: Diagnosis not present

## 2019-03-16 DIAGNOSIS — Z510 Encounter for antineoplastic radiation therapy: Secondary | ICD-10-CM | POA: Diagnosis not present

## 2019-03-16 DIAGNOSIS — D0512 Intraductal carcinoma in situ of left breast: Secondary | ICD-10-CM | POA: Diagnosis not present

## 2019-03-17 ENCOUNTER — Ambulatory Visit
Admission: RE | Admit: 2019-03-17 | Discharge: 2019-03-17 | Disposition: A | Discharge status: Home / Self Care | Payer: 59 | Source: Ambulatory Visit | Attending: Radiation Oncology | Admitting: Radiation Oncology

## 2019-03-17 ENCOUNTER — Other Ambulatory Visit: Payer: Self-pay

## 2019-03-17 DIAGNOSIS — Z510 Encounter for antineoplastic radiation therapy: Secondary | ICD-10-CM | POA: Diagnosis not present

## 2019-03-17 DIAGNOSIS — C50412 Malignant neoplasm of upper-outer quadrant of left female breast: Secondary | ICD-10-CM | POA: Diagnosis not present

## 2019-03-17 DIAGNOSIS — D0512 Intraductal carcinoma in situ of left breast: Secondary | ICD-10-CM | POA: Diagnosis not present

## 2019-03-20 ENCOUNTER — Other Ambulatory Visit: Payer: Self-pay

## 2019-03-20 ENCOUNTER — Ambulatory Visit
Admission: RE | Admit: 2019-03-20 | Discharge: 2019-03-20 | Disposition: A | Discharge status: Home / Self Care | Payer: 59 | Source: Ambulatory Visit | Attending: Radiation Oncology | Admitting: Radiation Oncology

## 2019-03-20 DIAGNOSIS — C50412 Malignant neoplasm of upper-outer quadrant of left female breast: Secondary | ICD-10-CM | POA: Diagnosis not present

## 2019-03-20 DIAGNOSIS — Z510 Encounter for antineoplastic radiation therapy: Secondary | ICD-10-CM | POA: Diagnosis not present

## 2019-03-20 DIAGNOSIS — Z170 Estrogen receptor positive status [ER+]: Secondary | ICD-10-CM

## 2019-03-20 DIAGNOSIS — D0512 Intraductal carcinoma in situ of left breast: Secondary | ICD-10-CM | POA: Diagnosis not present

## 2019-03-20 MED ORDER — SONAFINE EX EMUL
1.0000 "application " | Freq: Once | CUTANEOUS | Status: AC
  Administered 2019-03-20: 1 via TOPICAL

## 2019-03-21 ENCOUNTER — Ambulatory Visit
Admission: RE | Admit: 2019-03-21 | Discharge: 2019-03-21 | Disposition: A | Discharge status: Home / Self Care | Payer: 59 | Source: Ambulatory Visit | Attending: Radiation Oncology | Admitting: Radiation Oncology

## 2019-03-21 ENCOUNTER — Other Ambulatory Visit: Payer: Self-pay

## 2019-03-21 DIAGNOSIS — C50412 Malignant neoplasm of upper-outer quadrant of left female breast: Secondary | ICD-10-CM | POA: Diagnosis not present

## 2019-03-21 DIAGNOSIS — Z510 Encounter for antineoplastic radiation therapy: Secondary | ICD-10-CM | POA: Diagnosis not present

## 2019-03-21 DIAGNOSIS — D0512 Intraductal carcinoma in situ of left breast: Secondary | ICD-10-CM | POA: Diagnosis not present

## 2019-03-22 ENCOUNTER — Ambulatory Visit
Admission: RE | Admit: 2019-03-22 | Discharge: 2019-03-22 | Disposition: A | Discharge status: Home / Self Care | Payer: 59 | Source: Ambulatory Visit | Attending: Radiation Oncology | Admitting: Radiation Oncology

## 2019-03-22 ENCOUNTER — Other Ambulatory Visit: Payer: Self-pay

## 2019-03-22 DIAGNOSIS — C50412 Malignant neoplasm of upper-outer quadrant of left female breast: Secondary | ICD-10-CM | POA: Diagnosis not present

## 2019-03-22 DIAGNOSIS — Z510 Encounter for antineoplastic radiation therapy: Secondary | ICD-10-CM | POA: Diagnosis not present

## 2019-03-22 DIAGNOSIS — D0512 Intraductal carcinoma in situ of left breast: Secondary | ICD-10-CM | POA: Diagnosis not present

## 2019-03-27 ENCOUNTER — Ambulatory Visit
Admission: RE | Admit: 2019-03-27 | Discharge: 2019-03-27 | Disposition: A | Discharge status: Home / Self Care | Payer: 59 | Source: Ambulatory Visit | Attending: Radiation Oncology | Admitting: Radiation Oncology

## 2019-03-27 ENCOUNTER — Other Ambulatory Visit: Payer: Self-pay

## 2019-03-27 DIAGNOSIS — Z510 Encounter for antineoplastic radiation therapy: Secondary | ICD-10-CM | POA: Diagnosis not present

## 2019-03-27 DIAGNOSIS — C50412 Malignant neoplasm of upper-outer quadrant of left female breast: Secondary | ICD-10-CM | POA: Diagnosis not present

## 2019-03-28 ENCOUNTER — Ambulatory Visit
Admission: RE | Admit: 2019-03-28 | Discharge: 2019-03-28 | Disposition: A | Discharge status: Home / Self Care | Payer: 59 | Source: Ambulatory Visit | Attending: Radiation Oncology | Admitting: Radiation Oncology

## 2019-03-28 ENCOUNTER — Other Ambulatory Visit: Payer: Self-pay

## 2019-03-28 DIAGNOSIS — Z510 Encounter for antineoplastic radiation therapy: Secondary | ICD-10-CM | POA: Insufficient documentation

## 2019-03-28 DIAGNOSIS — C50412 Malignant neoplasm of upper-outer quadrant of left female breast: Secondary | ICD-10-CM | POA: Diagnosis not present

## 2019-03-28 DIAGNOSIS — D0512 Intraductal carcinoma in situ of left breast: Secondary | ICD-10-CM | POA: Diagnosis not present

## 2019-03-29 ENCOUNTER — Ambulatory Visit
Admission: RE | Admit: 2019-03-29 | Discharge: 2019-03-29 | Disposition: A | Discharge status: Home / Self Care | Payer: 59 | Source: Ambulatory Visit | Attending: Radiation Oncology | Admitting: Radiation Oncology

## 2019-03-29 ENCOUNTER — Other Ambulatory Visit: Payer: Self-pay | Admitting: Family Medicine

## 2019-03-29 ENCOUNTER — Other Ambulatory Visit: Payer: Self-pay | Admitting: *Deleted

## 2019-03-29 ENCOUNTER — Other Ambulatory Visit: Payer: Self-pay

## 2019-03-29 DIAGNOSIS — F411 Generalized anxiety disorder: Secondary | ICD-10-CM

## 2019-03-29 DIAGNOSIS — D0512 Intraductal carcinoma in situ of left breast: Secondary | ICD-10-CM | POA: Diagnosis not present

## 2019-03-29 DIAGNOSIS — C50412 Malignant neoplasm of upper-outer quadrant of left female breast: Secondary | ICD-10-CM | POA: Diagnosis not present

## 2019-03-29 DIAGNOSIS — Z510 Encounter for antineoplastic radiation therapy: Secondary | ICD-10-CM | POA: Diagnosis not present

## 2019-03-29 MED FILL — SERTRALINE HCL 50 MG TABLET: 50 | 90 days supply | Qty: 90 | Fill #0

## 2019-03-30 ENCOUNTER — Other Ambulatory Visit: Payer: Self-pay

## 2019-03-30 ENCOUNTER — Ambulatory Visit
Admission: RE | Admit: 2019-03-30 | Discharge: 2019-03-30 | Disposition: A | Discharge status: Home / Self Care | Payer: 59 | Source: Ambulatory Visit | Attending: Radiation Oncology | Admitting: Radiation Oncology

## 2019-03-30 DIAGNOSIS — Z510 Encounter for antineoplastic radiation therapy: Secondary | ICD-10-CM | POA: Diagnosis not present

## 2019-03-30 DIAGNOSIS — D0512 Intraductal carcinoma in situ of left breast: Secondary | ICD-10-CM | POA: Diagnosis not present

## 2019-03-30 DIAGNOSIS — C50412 Malignant neoplasm of upper-outer quadrant of left female breast: Secondary | ICD-10-CM | POA: Diagnosis not present

## 2019-03-31 ENCOUNTER — Ambulatory Visit
Admission: RE | Admit: 2019-03-31 | Discharge: 2019-03-31 | Disposition: A | Discharge status: Home / Self Care | Payer: 59 | Source: Ambulatory Visit | Attending: Radiation Oncology | Admitting: Radiation Oncology

## 2019-03-31 ENCOUNTER — Other Ambulatory Visit: Payer: Self-pay

## 2019-03-31 DIAGNOSIS — C50412 Malignant neoplasm of upper-outer quadrant of left female breast: Secondary | ICD-10-CM | POA: Diagnosis not present

## 2019-03-31 DIAGNOSIS — D0512 Intraductal carcinoma in situ of left breast: Secondary | ICD-10-CM | POA: Diagnosis not present

## 2019-03-31 DIAGNOSIS — Z510 Encounter for antineoplastic radiation therapy: Secondary | ICD-10-CM | POA: Diagnosis not present

## 2019-04-01 ENCOUNTER — Encounter: Payer: Self-pay | Admitting: *Deleted

## 2019-04-02 ENCOUNTER — Encounter: Payer: Self-pay | Admitting: Oncology

## 2019-04-03 ENCOUNTER — Other Ambulatory Visit: Payer: Self-pay

## 2019-04-03 ENCOUNTER — Ambulatory Visit
Admission: RE | Admit: 2019-04-03 | Discharge: 2019-04-03 | Disposition: A | Discharge status: Home / Self Care | Payer: 59 | Source: Ambulatory Visit | Attending: Radiation Oncology | Admitting: Radiation Oncology

## 2019-04-03 DIAGNOSIS — Z510 Encounter for antineoplastic radiation therapy: Secondary | ICD-10-CM | POA: Diagnosis not present

## 2019-04-03 DIAGNOSIS — D0512 Intraductal carcinoma in situ of left breast: Secondary | ICD-10-CM | POA: Diagnosis not present

## 2019-04-03 DIAGNOSIS — C50412 Malignant neoplasm of upper-outer quadrant of left female breast: Secondary | ICD-10-CM | POA: Diagnosis not present

## 2019-04-04 ENCOUNTER — Ambulatory Visit
Admission: RE | Admit: 2019-04-04 | Discharge: 2019-04-04 | Disposition: A | Discharge status: Home / Self Care | Payer: 59 | Source: Ambulatory Visit | Attending: Radiation Oncology | Admitting: Radiation Oncology

## 2019-04-04 ENCOUNTER — Other Ambulatory Visit: Payer: Self-pay

## 2019-04-04 DIAGNOSIS — D0512 Intraductal carcinoma in situ of left breast: Secondary | ICD-10-CM | POA: Diagnosis not present

## 2019-04-04 DIAGNOSIS — C50412 Malignant neoplasm of upper-outer quadrant of left female breast: Secondary | ICD-10-CM | POA: Diagnosis not present

## 2019-04-04 DIAGNOSIS — Z510 Encounter for antineoplastic radiation therapy: Secondary | ICD-10-CM | POA: Diagnosis not present

## 2019-04-05 ENCOUNTER — Other Ambulatory Visit: Payer: Self-pay

## 2019-04-05 ENCOUNTER — Ambulatory Visit
Admission: RE | Admit: 2019-04-05 | Discharge: 2019-04-05 | Disposition: A | Discharge status: Home / Self Care | Payer: 59 | Source: Ambulatory Visit | Attending: Radiation Oncology | Admitting: Radiation Oncology

## 2019-04-05 DIAGNOSIS — C50412 Malignant neoplasm of upper-outer quadrant of left female breast: Secondary | ICD-10-CM | POA: Diagnosis not present

## 2019-04-05 DIAGNOSIS — D0512 Intraductal carcinoma in situ of left breast: Secondary | ICD-10-CM | POA: Diagnosis not present

## 2019-04-05 DIAGNOSIS — Z510 Encounter for antineoplastic radiation therapy: Secondary | ICD-10-CM | POA: Diagnosis not present

## 2019-04-06 ENCOUNTER — Other Ambulatory Visit: Payer: Self-pay

## 2019-04-06 ENCOUNTER — Ambulatory Visit
Admission: RE | Admit: 2019-04-06 | Discharge: 2019-04-06 | Disposition: A | Discharge status: Home / Self Care | Payer: 59 | Source: Ambulatory Visit | Attending: Radiation Oncology | Admitting: Radiation Oncology

## 2019-04-06 DIAGNOSIS — C50412 Malignant neoplasm of upper-outer quadrant of left female breast: Secondary | ICD-10-CM | POA: Diagnosis not present

## 2019-04-06 DIAGNOSIS — Z510 Encounter for antineoplastic radiation therapy: Secondary | ICD-10-CM | POA: Diagnosis not present

## 2019-04-06 DIAGNOSIS — D0512 Intraductal carcinoma in situ of left breast: Secondary | ICD-10-CM | POA: Diagnosis not present

## 2019-04-07 ENCOUNTER — Ambulatory Visit
Admission: RE | Admit: 2019-04-07 | Discharge: 2019-04-07 | Disposition: A | Discharge status: Home / Self Care | Payer: 59 | Source: Ambulatory Visit | Attending: Radiation Oncology | Admitting: Radiation Oncology

## 2019-04-07 ENCOUNTER — Other Ambulatory Visit: Payer: Self-pay

## 2019-04-07 DIAGNOSIS — D0512 Intraductal carcinoma in situ of left breast: Secondary | ICD-10-CM | POA: Diagnosis not present

## 2019-04-07 DIAGNOSIS — C50412 Malignant neoplasm of upper-outer quadrant of left female breast: Secondary | ICD-10-CM | POA: Diagnosis not present

## 2019-04-07 DIAGNOSIS — Z510 Encounter for antineoplastic radiation therapy: Secondary | ICD-10-CM | POA: Diagnosis not present

## 2019-04-10 ENCOUNTER — Ambulatory Visit
Admission: RE | Admit: 2019-04-10 | Discharge: 2019-04-10 | Disposition: A | Discharge status: Home / Self Care | Payer: 59 | Source: Ambulatory Visit | Attending: Radiation Oncology | Admitting: Radiation Oncology

## 2019-04-10 ENCOUNTER — Other Ambulatory Visit: Payer: Self-pay

## 2019-04-10 DIAGNOSIS — Z510 Encounter for antineoplastic radiation therapy: Secondary | ICD-10-CM | POA: Diagnosis not present

## 2019-04-10 DIAGNOSIS — C50412 Malignant neoplasm of upper-outer quadrant of left female breast: Secondary | ICD-10-CM | POA: Diagnosis not present

## 2019-04-10 DIAGNOSIS — D0512 Intraductal carcinoma in situ of left breast: Secondary | ICD-10-CM | POA: Diagnosis not present

## 2019-04-11 ENCOUNTER — Encounter: Payer: Self-pay | Admitting: Radiation Oncology

## 2019-04-11 ENCOUNTER — Other Ambulatory Visit: Payer: Self-pay

## 2019-04-11 ENCOUNTER — Ambulatory Visit: Payer: 59

## 2019-04-11 DIAGNOSIS — C50412 Malignant neoplasm of upper-outer quadrant of left female breast: Secondary | ICD-10-CM | POA: Diagnosis not present

## 2019-04-11 DIAGNOSIS — Z510 Encounter for antineoplastic radiation therapy: Secondary | ICD-10-CM | POA: Diagnosis not present

## 2019-04-11 DIAGNOSIS — D0512 Intraductal carcinoma in situ of left breast: Secondary | ICD-10-CM | POA: Diagnosis not present

## 2019-04-12 ENCOUNTER — Encounter: Payer: Self-pay | Admitting: *Deleted

## 2019-04-12 ENCOUNTER — Ambulatory Visit: Payer: 59

## 2019-04-13 DIAGNOSIS — H01004 Unspecified blepharitis left upper eyelid: Secondary | ICD-10-CM | POA: Diagnosis not present

## 2019-04-13 DIAGNOSIS — H01001 Unspecified blepharitis right upper eyelid: Secondary | ICD-10-CM | POA: Diagnosis not present

## 2019-04-18 ENCOUNTER — Ambulatory Visit (INDEPENDENT_AMBULATORY_CARE_PROVIDER_SITE_OTHER): Payer: 59 | Admitting: Family Medicine

## 2019-04-18 ENCOUNTER — Encounter: Payer: Self-pay | Admitting: Family Medicine

## 2019-04-18 ENCOUNTER — Other Ambulatory Visit: Payer: Self-pay

## 2019-04-18 VITALS — BP 94/63 | HR 86

## 2019-04-18 DIAGNOSIS — M810 Age-related osteoporosis without current pathological fracture: Secondary | ICD-10-CM

## 2019-04-18 DIAGNOSIS — L900 Lichen sclerosus et atrophicus: Secondary | ICD-10-CM | POA: Diagnosis not present

## 2019-04-18 DIAGNOSIS — C50412 Malignant neoplasm of upper-outer quadrant of left female breast: Secondary | ICD-10-CM | POA: Diagnosis not present

## 2019-04-18 DIAGNOSIS — Z170 Estrogen receptor positive status [ER+]: Secondary | ICD-10-CM

## 2019-04-18 MED ORDER — CLOBETASOL PROPIONATE 0.05 % EX GEL: 1.0000 | Freq: Two times a day (BID) | CUTANEOUS | 3 refills | Status: DC

## 2019-04-18 MED ORDER — ALENDRONATE SODIUM 70 MG PO TABS: ORAL_TABLET | ORAL | 3 refills | Status: DC

## 2019-04-18 MED FILL — CLOBETASOL 0.05% GEL: 0.05 | 25 days supply | Qty: 60 | Fill #0

## 2019-04-18 NOTE — Assessment & Plan Note (Signed)
Not typical, but given partial response to hydrocortisone will begin temovate. Advised to limit use as possible.

## 2019-04-18 NOTE — Assessment & Plan Note (Signed)
Refilled fosamax.

## 2019-04-18 NOTE — Assessment & Plan Note (Signed)
To begin Tamoxifen, advised risks of endometrial issues and with estring, unlikely to be a good candidate for prog for uterine protection, though could consider IUD if needed.

## 2019-04-18 NOTE — Progress Notes (Signed)
   Subjective:    Patient ID: Stephanie [name] is a 53 y.o. female presenting with vaginal dryness  on 04/18/2019  HPI: Vaginal itching, swelling, dryness. Using cortisone and it gets better then gets worse. Notes she as been having symptoms x 3-4 months. Notes it feels like it is in the top right. Could start estring in January when starts tamoxifen from her oncologist, as she is having vaginal dryness. Has an intact uterus.   Review of Systems  Constitutional: Negative for chills and fever.  Respiratory: Negative for shortness of breath.   Cardiovascular: Negative for chest pain.  Gastrointestinal: Negative for abdominal pain, nausea and vomiting.  Genitourinary: Negative for dysuria.  Skin: Negative for rash.      Objective:    BP 94/63   Pulse 86   LMP 01/25/2014 (Approximate)  Physical Exam Constitutional:      General: She is not in acute distress.    Appearance: She is well-developed.  HENT:     Head: Normocephalic and atraumatic.  Eyes:     General: No scleral icterus. Cardiovascular:     Rate and Rhythm: Normal rate.  Pulmonary:     Effort: Pulmonary effort is normal.  Abdominal:     Palpations: Abdomen is soft.  Genitourinary:    Comments: Minimal paleness and loss of architecture Musculoskeletal:     Cervical back: Neck supple.  Skin:    General: Skin is warm and dry.  Neurological:     Mental Status: She is alert and oriented to person, place, and time.         Assessment & Plan:   Problem List Items Addressed This Visit      Unprioritized   Osteoporosis of lumbar spine    Refilled fosamax      Relevant Medications   alendronate (FOSAMAX) 70 MG tablet   Malignant neoplasm of upper-outer quadrant of left breast in female, estrogen receptor positive ([place])    To begin Tamoxifen, advised risks of endometrial issues and with estring, unlikely to be a good candidate for prog for uterine protection, though could consider IUD if needed.      Lichen sclerosus et atrophicus - Primary    Not typical, but given partial response to hydrocortisone will begin temovate. Advised to limit use as possible.      Relevant Medications   clobetasol (TEMOVATE) 0.05 % GEL      Total face-to-face time with patient: 15 minutes. Over 50% of encounter was spent on counseling and coordination of care. Return in about 6 months (around 10/17/2019).  Donnamae Jude 04/18/2019 11:54 AM

## 2019-04-29 MED FILL — TAMOXIFEN 20 MG TABLET: 20 | 90 days supply | Qty: 90 | Fill #0

## 2019-04-29 MED FILL — ALENDRONATE NA 70 MG TAB: 70 | 84 days supply | Qty: 12 | Fill #1

## 2019-04-29 MED FILL — ESTRING 2 MG VAGINAL RING: 2 | 90 days supply | Qty: 1 | Fill #0

## 2019-04-30 ENCOUNTER — Encounter: Payer: Self-pay | Admitting: Oncology

## 2019-05-03 ENCOUNTER — Encounter: Payer: Self-pay | Admitting: Oncology

## 2019-05-15 NOTE — Progress Notes (Signed)
I called the patient today about her upcoming follow-up appointment in radiation oncology.   Given the state of the COVID-19 pandemic, concerning case numbers in our community, and guidance from [facility], I offered a phone assessment with the patient to determine if coming to the clinic was necessary. She accepted.  I let the patient know that I had spoken with Dr. Isidore Moos, and she wanted them to know the importance of washing their hands for at least 20 seconds at a time, especially after going out in public, and before they eat.  Limit going out in public whenever possible. Do not touch your face, unless your hands are clean, such as when bathing. Get plenty of rest, eat well, and stay hydrated.   The patient denies any symptomatic concerns.  Specifically, they report good healing of their skin in the radiation fields.  Skin is intact.    I recommended that she continue skin care by applying oil or lotion with vitamin E to the skin in the radiation fields, BID, for 2 more months.  Continue follow-up with medical oncology - follow-up is scheduled on 07/04/2019 with Dr. Jana Hakim .  I explained that yearly mammograms are important for patients with intact breast tissue, and physical exams are important after mastectomy for patients that cannot undergo mammography.  I encouraged her to call if she had further questions or concerns about her healing. Otherwise, she will follow-up PRN in radiation oncology. Patient is pleased with this plan, and we will cancel her upcoming follow-up to reduce the risk of COVID-19 transmission.

## 2019-05-16 ENCOUNTER — Ambulatory Visit
Admission: RE | Admit: 2019-05-16 | Discharge: 2019-05-16 | Disposition: A | Discharge status: Home / Self Care | Payer: 59 | Source: Ambulatory Visit | Attending: Radiation Oncology | Admitting: Radiation Oncology

## 2019-05-20 NOTE — Progress Notes (Signed)
  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  Cancer Staging: Cancer Staging Malignant neoplasm of upper-outer quadrant of left breast in female, estrogen receptor positive ([place]) Staging form: Breast, AJCC 8th Edition - Clinical stage from 01/06/2019: Stage 0 (cTis (DCIS), cN0, cM0, ER+, PR+) - Signed by Gardenia Phlegm, NP on 01/18/2019 - Pathologic stage from 02/09/2019: Stage 0 (pTis (DCIS), pN0, cM0, ER+, PR+) - Signed by Gardenia Phlegm, NP on 03/01/2019   Intent: Curative  Radiation Treatment Dates: 03/13/2019 through 04/11/2019 Site Technique Total Dose (Gy) Dose per Fx (Gy) Completed Fx Beam Energies  Breast, Left: Breast_Lt 3D 42.56/42.56 2.66 16/16 6X  Breast, Left: Breast_Lt_Bst 3D 8/8 2 4/4 6X   Narrative: The patient tolerated radiation therapy relatively well.   Plan: The patient will follow-up with radiation oncology in 1 mo or as needed.  -----------------------------------  Eppie Gibson, MD

## 2019-05-23 ENCOUNTER — Other Ambulatory Visit: Payer: Self-pay | Admitting: Oncology

## 2019-05-23 NOTE — Progress Notes (Unsigned)
Radiation Treatment Dates: 03/13/2019 through 04/11/2019 Site Technique Total Dose (Gy) Dose per Fx (Gy) Completed Fx Beam Energies  Breast, Left: Breast_Lt 3D 42.56/42.56 2.66 16/16 6X  Breast, Left: Breast_Lt_Bst 3D 8/8 2 4/4 6X   Narrative: The patient tolerated radiation therapy relatively well.

## 2019-05-26 ENCOUNTER — Encounter: Payer: Self-pay | Admitting: Oncology

## 2019-06-27 ENCOUNTER — Encounter: Payer: Self-pay | Admitting: Oncology

## 2019-07-03 NOTE — Progress Notes (Signed)
[place]  [phone] [phone]     ID: Stephanie [name] DOB: June 21, 1965  MR#: [id number]  SKA#:[id number]  Patient Care Team: [name], MD as PCP - General (Family Medicine) Kennith Center, RD as Dietitian (Family Medicine) Donnamae Jude, MD as Consulting Physician (Obstetrics and Gynecology) Magrinat, Virgie Dad, MD as Consulting Physician (Oncology) [name], MD as Consulting Physician (General Surgery) Eppie Gibson, MD as Attending Physician (Radiation Oncology) Chauncey Cruel, MD OTHER MD:  CHIEF COMPLAINT: Ductal carcinoma in situ  CURRENT TREATMENT: tamoxifen   INTERVAL HISTORY: Stephanie [name] returns today for follow up of her noninvasive breast cancer. She was evaluated in the breast cancer clinic on 02/28/2019.  Since that time, she met with Dr. Isidore Moos on 03/03/2019 to discuss radiation therapy. She was treated from 03/13/2019 through 04/11/2019.  She tolerated radiation well, with some desquamation which has completely resolved and minimal fatigue.  She was able to continue to work full-time.  She started on tamoxifen on 04/28/2019.  She is having hot flashes about 3 times daily and about 2 or 3 times nightly as well.  These do wake her up.  She has some vaginal discharge which she describes as similar to the way it was before she stopped having periods   REVIEW OF SYSTEMS: Stephanie [name] has had both her COVID-19 vaccines.  She had the [place] and tolerated it well.  She is walking about 3 miles most days depending on the weather.  She is thinking of getting back into running and has bought a new pair of shoes.  She is having some decrease in range of motion left upper extremity and has not yet been scheduled for physical therapy.  She has a history of constipation which persists.  She takes MiraLAX perhaps once a week.  She is having issues with concentration.  This is not entirely a new problem.  She does have a history of ADD she  tells me and was on Vyvanse previously.  She feels this is now worse and really does not want to get back on Vyvanse or Adderall.   HISTORY OF CURRENT ILLNESS: From the original intake note:  Stephanie [name] has a history of left breast lumpectomy ([id number]) performed on 08/15/2015 for fibroscystic changes with calcifications. She underwent a second left breast lumpectomy on 12/30/2016 [id number]) for lobular neoplasia, complex sclerosing lesion, and fibrocystic change and sclerosing adenosis with calcifications.  She had routine screening mammography on 12/14/2017 showing a possible abnormality in the left breast. She underwent left diagnostic mammogram on 01/27/2018, which showed: breast density category C; probably benign left breast calcifications, most likely fat necrosis (measurements not given). Short term follow up was recommended.  She returned for follow up on 09/29/2018 (likely delayed due to the pandemic) and underwent diagnostic left mammogram. This showed: breast density category C; unchanged 1.5 cm group of calcifications at the lumpectomy site; 1.2 cm group of calcifications within the anterior upper-outer left breast. Short term follow up was again recommended.  She bilateral diagnostic mammography with tomography at [facility] on 01/03/2019 showing: breast density category C; stable probably benign calcifications at the left lumpectomy site; 1.5 cm grouped calcifications within the upper-outer quadrant of the left breast with suspicious morphology and distribution; no evidence of right breast malignancy.   Accordingly on 01/06/2019 she proceeded to biopsy of the left breast area in question. The pathology from this procedure ([id number]) showed: ductal carcinoma in situ, high grade. Prognostic indicators significant for:  estrogen receptor, 100% positive and progesterone receptor, 80% positive, both with strong staining intensity.   She underwent genetic counseling on 01/16/2019,  which showed negative results.  She also underwent biopsy of the calcifications at the left lumpectomy site on 01/17/2019. Pathology [id number]) showed: focal fibrocystic changes; dense fibrosis with pigmented histiocytes and foreign body giant cells.  She opted to proceed with left breast lumpectomy on 02/09/2019 under Dr. Donne Hazel. Pathology from the procedure ([id number]) showed:  1. Left Breast, lumpectomy  - fibrocystic changes 2. Left Breast, lumpectomy  - ductal carcinoma in situ with calcifications, intermediate grade, spanning 2.4 cm  - lobular neoplasia  -negative resection margins   The patient's subsequent history is as detailed below.   PAST MEDICAL HISTORY: Past Medical History:  Diagnosis Date  . Anxiety   . Breast mass, left   . Cancer ([place])   . Complication of anesthesia    felt lethargic x 4 days following last surgery in 2017, but 2018 did okay  . Depression   . Headache    migraines  . History of radiation therapy 03/13/19- 04/11/19   Left Breast 16 fraction of 2. 66 Gy each to total 42.56 Gy. Left breast boost 4 fractions of 2 Gy to total 8 Gy.   . Osteopenia     PAST SURGICAL HISTORY: Past Surgical History:  Procedure Laterality Date  . BREAST BIOPSY Left 07/10/2015   high risk stereo  . BREAST EXCISIONAL BIOPSY Left 08/15/2015   ADH  . BREAST LUMPECTOMY WITH RADIOACTIVE SEED LOCALIZATION Left 08/15/2015   Procedure:  LEFT BREAST LUMPECTOMY WITH RADIOACTIVE SEED LOCALIZATION;  Surgeon: Erroll Luna, MD;  Location: [place];  Service: General;  Laterality: Left;  . BREAST LUMPECTOMY WITH RADIOACTIVE SEED LOCALIZATION Left 02/09/2019   Procedure: LEFT BREAST LUMPECTOMY WITH RADIOACTIVE SEED LOCALIZATION;  Surgeon: [name], MD;  Location: [place];  Service: General;  Laterality: Left;  . EXCISION OF BREAST BIOPSY Left 12/30/2016   Benign ALH sclerosing lesion  . RADIOACTIVE SEED GUIDED EXCISIONAL BREAST BIOPSY Left 12/30/2016    Procedure: RADIOACTIVE SEED GUIDED EXCISIONAL LEFT BREAST BIOPSY;  Surgeon: [name], MD;  Location: [place];  Service: General;  Laterality: Left;  . RADIOACTIVE SEED GUIDED EXCISIONAL BREAST BIOPSY Left 02/09/2019   Procedure: RADIOACTIVE SEED GUIDED EXCISIONAL LEFT  BREAST BIOPSY;  Surgeon: [name], MD;  Location: [place];  Service: General;  Laterality: Left;  . TONSILLECTOMY  Age 47  . TRIGGER FINGER RELEASE  2010   Right Thumb    FAMILY HISTORY: Family History  Problem Relation Age of Onset  . Depression Brother   . Atrial fibrillation Brother   . Hypertension Mother   . Osteoporosis Mother   . ADD / ADHD Son   . ADD / ADHD Daughter   . Diabetes Maternal Grandmother   . Diabetes Maternal Grandfather   . Diabetes Paternal Grandmother   . Diabetes Paternal Grandfather   . Parkinson's disease Maternal Aunt    Patient's father was 86 years old when he died from complications of cardiomyopathy.  He was adopted and there is no family history on his side. Patient's mother is 63 years old as of November 2020. The patient has 3 brothers, no sisters.  She denies/or notes a family hx of breast, prostate, pancreatic or ovarian cancer.    GYNECOLOGIC HISTORY:  Patient's last menstrual period was 01/25/2014 (approximate). Menarche: 54 years old Age at first live birth: Any 54 years old GX P 2  LMP 49 Contraceptive oral contraceptives at least 7 years, with no complications HRT 9 months, discontinued because of malaise  Hysterectomy? no BSO?  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  She is currently doing preadmissions at the new [facility].  Her husband Nicole Kindred works in Radio producer..  Daughter Apolonio Schneiders, 23, lives in [place] and works as a Immunologist.  Son [place], [place], with Asperger's, currently attends [employer], and lives at home with the patient.      ADVANCED DIRECTIVES: In the absence of any documents to the contrary the patient's husband is her healthcare power of attorney   HEALTH MAINTENANCE: Social History   Tobacco Use  . Smoking status: Never Smoker  . Smokeless tobacco: Never Used  Substance Use Topics  . Alcohol use: No    Comment: rare use  . Drug use: No     Colonoscopy: not on file  PAP: 08/2017, negative  Bone density: 11/2017, -2.0   Allergies  Allergen Reactions  . Wellbutrin Xl [Bupropion] Hives    Current Outpatient Medications  Medication Sig Dispense Refill  . alendronate (FOSAMAX) 70 MG tablet TAKE 1 TABLET BY MOUTH ONCE A WEEK. TAKE WITH A FULL GLASS OF WATER ON AN EMPTY STOMACH 12 tablet 3  . Biotin w/ Vitamins C & E (HAIR/SKIN/NAILS PO) Take 1 tablet by mouth daily.    . Calcium Carb-Cholecalciferol (CALCIUM + D3 PO) Take 1 tablet by mouth daily.    . clobetasol (TEMOVATE) 0.05 % GEL Apply 1 Dose topically 2 (two) times daily. 2x/day x 3-4 wks, then 1x/day x 4 wks, then every other day x  4 wks, then 2x/wk 60 g 3  . estradiol (ESTRING) 2 MG vaginal ring Place 2 mg vaginally every 3 (three) months. follow package directions (Patient not taking: Reported on 03/03/2019) 1 each 12  . sertraline (ZOLOFT) 50 MG tablet TAKE 1 TABLET (50 MG TOTAL) BY MOUTH DAILY. 90 tablet 3  . TURMERIC PO Take by mouth.     No current facility-administered medications for this visit.    OBJECTIVE: Middle-aged white woman who appears younger than stated age 54:   07/04/19 1245  BP: (!) 123/53  Pulse: 88  Resp: 20  Temp: 98.2 F (36.8 C)  SpO2: 98%     Body mass index is 27.23 kg/m.   Wt Readings from Last 3 Encounters:  07/04/19 153 lb 11.2 oz (69.7 kg)  02/28/19 149 lb 6.4 oz (67.8 kg)  02/09/19 148 lb 4.8 oz (67.3 kg)      ECOG FS:0 - Asymptomatic  Sclerae unicteric, EOMs intact Wearing a mask No cervical or supraclavicular adenopathy Lungs no rales or rhonchi Heart regular rate and rhythm Abd soft,  nontender, positive bowel sounds MSK no focal spinal tenderness, no upper extremity lymphedema Neuro: nonfocal, well oriented, appropriate affect Breasts: The right breast is unremarkable.  The left breast is status post lumpectomy and radiation.  The cosmetic result is excellent.  There is still slight erythema and there is some swelling in the left breast currently which as she is aware is not uncommon after radiation..  Both axillae are benign.   LAB RESULTS:  CMP     Component Value Date/Time   NA 138 02/02/2019 1000   NA 140 09/06/2017 1533   K 4.2 02/02/2019 1000   CL 101 02/02/2019 1000   CO2 25 02/02/2019 1000  GLUCOSE 138 (H) 02/02/2019 1000   BUN 13 02/02/2019 1000   BUN 14 09/06/2017 1533   CREATININE 0.83 02/02/2019 1000   CALCIUM 9.5 02/02/2019 1000   PROT 6.7 09/06/2017 1533   ALBUMIN 4.3 09/06/2017 1533   AST 31 09/06/2017 1533   ALT 17 09/06/2017 1533   ALKPHOS 78 09/06/2017 1533   BILITOT <0.2 09/06/2017 1533   GFRNONAA >60 02/02/2019 1000   GFRAA >60 02/02/2019 1000    No results found for: TOTALPROTELP, ALBUMINELP, A1GS, A2GS, BETS, BETA2SER, GAMS, MSPIKE, SPEI  No results found for: KPAFRELGTCHN, LAMBDASER, KAPLAMBRATIO  Lab Results  Component Value Date   WBC 6.7 02/02/2019   HGB 13.4 02/02/2019   HCT 39.7 02/02/2019   MCV 91.7 02/02/2019   PLT 309 02/02/2019   No results found for: LABCA2  No components found for: TMHDQQ229  No results for input(s): INR in the last 168 hours.  No results found for: LABCA2  No results found for: NLG921  No results found for: JHE174  No results found for: YCX448  No results found for: CA2729  No components found for: HGQUANT  No results found for: CEA1 / No results found for: CEA1   No results found for: AFPTUMOR  No results found for: CHROMOGRNA  No results found for: HGBA, HGBA2QUANT, HGBFQUANT, HGBSQUAN (Hemoglobinopathy evaluation)   No results found for: LDH  No results found for: IRON,  TIBC, IRONPCTSAT (Iron and TIBC)  No results found for: FERRITIN  Urinalysis    Component Value Date/Time   LABSPEC 1.025 09/09/2009 1037   PHURINE 6.0 09/09/2009 1037   GLUCOSEU NEGATIVE 09/09/2009 1037   HGBUR TRACE (A) 09/09/2009 1037   BILIRUBINUR NEGATIVE 09/09/2009 [place] 09/09/2009 1037   PROTEINUR NEGATIVE 09/09/2009 1037   UROBILINOGEN 0.2 09/09/2009 1037   NITRITE NEGATIVE 09/09/2009 1037   LEUKOCYTESUR  09/09/2009 1037    NEGATIVE Biochemical Testing Only. Please order routine urinalysis from main lab if confirmatory testing is needed.    STUDIES: No results found.   ELIGIBLE FOR AVAILABLE RESEARCH PROTOCOL: no  ASSESSMENT: 54 y.o.  woman status post left breast biopsy 01/06/2023 ductal carcinoma in situ, grade 3, estrogen and progesterone receptor positive  (a) biopsy of a second area of calcifications in the left breast on 01/17/2019 -  (1) status post left lumpectomy 02/09/2023 2.4 cm ductal carcinoma in situ, grade 2, with negative margins  (2) adjuvant radiation 03/13/2019 - 04/11/2019  (a) left breast / 42.56 Gy in 16 fractions  (b) boost / 8 Gy in 4 fractions  (3) tamoxifen started 04/28/2019  (4) genetics testing 01/19/2019 through the [facility] Breast Cancer STAT Panel + Common Hereditary Cancers Panel found no deleterious mutations in ATM, BRCA1, BRCA2, CDH1, CHEK2, PALB2, PTEN, STK11 and TP53 // APC, ATM, AXIN2, BARD1, BMPR1A, BRCA1, BRCA2, BRIP1, CDH1, CDKN2A (p14ARF), CDKN2A (p16INK4a), CKD4, CHEK2, CTNNA1, DICER1, EPCAM (Deletion/duplication testing only), GREM1 (promoter region deletion/duplication testing only), KIT, MEN1, MLH1, MSH2, MSH3, MSH6, MUTYH, NBN, NF1, NHTL1, PALB2, PDGFRA, PMS2, POLD1, POLE, PTEN, RAD50, RAD51C, RAD51D, SDHB, SDHC, SDHD, SMAD4, SMARCA4. STK11, TP53, TSC1, TSC2, and VHL.  The following genes were evaluated for sequence changes only: SDHA and HOXB13 c.251G>A variant only.   PLAN: Stephanie [name] did very  well with her local treatment, surgery and radiation, and has largely recovered from both.  She does need physical therapy and I have written for that to be done with 10 the next couple of weeks.  She is tolerating tamoxifen generally  well but is having significant issues with hot flashes.  We are going to start gabapentin at bedtime.  I do think that will make in a normal difference.  Hopefully the daytime hot flashes will decrease with a little bit of time and by the time I see her again in June will have lessened.  If not we will consider additional interventions.  We did discuss going back on methylphenidate or congeners but decided to increase these sertraline 200 mg and see if that works for her.  Note that although there is an interaction between sertraline and tamoxifen this is not of clinical significance as the tamoxifen level (as opposed to in Doxaphene) is not affected  She has an excellent exercise program.  This will help with the "brain fog" she is experiencing.  She understands this has elements of posttraumatic stress, grief, as well as medication.  I have set her up for repeat mammography in June and to see me shortly thereafter.  I can then see her on a yearly basis in June and if she sees her surgeon in October and her gynecologist in February she will have optimized her doctor visits.  Total encounter time 30 minutes.Chauncey Cruel, MD   07/04/2019 12:57 PM Medical Oncology and Hematology [facility] [place], [place] [address] Tel. [phone]    Fax. [phone]   This document serves as a record of services personally performed by Lurline Del, MD. It was created on his behalf by Wilburn Mylar, a trained medical scribe. The creation of this record is based on the scribe's personal observations and the provider's statements to them.   I, Lurline Del MD, have reviewed the above documentation for accuracy and completeness, and I  agree with the above.   *Total Encounter Time as defined by the Centers for Medicare and Medicaid Services includes, in addition to the face-to-face time of a patient visit (documented in the note above) non-face-to-face time: obtaining and reviewing outside history, ordering and reviewing medications, tests or procedures, care coordination (communications with other health care professionals or caregivers) and documentation in the medical record.

## 2019-07-04 ENCOUNTER — Inpatient Hospital Stay: Payer: 59 | Attending: Oncology | Admitting: Oncology

## 2019-07-04 ENCOUNTER — Encounter: Payer: Self-pay | Admitting: Oncology

## 2019-07-04 ENCOUNTER — Other Ambulatory Visit: Payer: Self-pay

## 2019-07-04 ENCOUNTER — Telehealth: Payer: Self-pay | Admitting: Oncology

## 2019-07-04 VITALS — BP 123/53 | HR 88 | Temp 98.2°F | Resp 20 | Ht 63.0 in | Wt 153.7 lb

## 2019-07-04 DIAGNOSIS — Z923 Personal history of irradiation: Secondary | ICD-10-CM | POA: Insufficient documentation

## 2019-07-04 DIAGNOSIS — Z79810 Long term (current) use of selective estrogen receptor modulators (SERMs): Secondary | ICD-10-CM | POA: Insufficient documentation

## 2019-07-04 DIAGNOSIS — R232 Flushing: Secondary | ICD-10-CM | POA: Insufficient documentation

## 2019-07-04 DIAGNOSIS — D0512 Intraductal carcinoma in situ of left breast: Secondary | ICD-10-CM | POA: Insufficient documentation

## 2019-07-04 DIAGNOSIS — M8580 Other specified disorders of bone density and structure, unspecified site: Secondary | ICD-10-CM | POA: Insufficient documentation

## 2019-07-04 DIAGNOSIS — C50412 Malignant neoplasm of upper-outer quadrant of left female breast: Secondary | ICD-10-CM | POA: Diagnosis not present

## 2019-07-04 DIAGNOSIS — Z170 Estrogen receptor positive status [ER+]: Secondary | ICD-10-CM | POA: Insufficient documentation

## 2019-07-04 DIAGNOSIS — Z79899 Other long term (current) drug therapy: Secondary | ICD-10-CM | POA: Diagnosis not present

## 2019-07-04 DIAGNOSIS — F418 Other specified anxiety disorders: Secondary | ICD-10-CM | POA: Diagnosis not present

## 2019-07-04 DIAGNOSIS — M810 Age-related osteoporosis without current pathological fracture: Secondary | ICD-10-CM

## 2019-07-04 DIAGNOSIS — R5383 Other fatigue: Secondary | ICD-10-CM | POA: Insufficient documentation

## 2019-07-04 MED ORDER — SERTRALINE HCL 100 MG PO TABS: 100.0000 mg | ORAL_TABLET | Freq: Every day | ORAL | 4 refills | Status: DC

## 2019-07-04 MED ORDER — GABAPENTIN 300 MG PO CAPS: 300.0000 mg | ORAL_CAPSULE | Freq: Every day | ORAL | 4 refills | Status: DC

## 2019-07-04 MED FILL — GABAPENTIN 300 MG CAPSULE: 300 | 90 days supply | Qty: 90 | Fill #0

## 2019-07-04 MED FILL — SERTRALINE HCL 100 MG TAB: 100 | 90 days supply | Qty: 90 | Fill #0

## 2019-07-04 NOTE — Telephone Encounter (Signed)
Scheduled 6/14 appt per 3/9 sch msg. Pt is aware of new appt date and time.

## 2019-07-13 ENCOUNTER — Ambulatory Visit: Payer: 59 | Attending: Oncology

## 2019-07-13 ENCOUNTER — Other Ambulatory Visit: Payer: Self-pay

## 2019-07-13 ENCOUNTER — Encounter: Payer: Self-pay | Admitting: Oncology

## 2019-07-13 DIAGNOSIS — C50412 Malignant neoplasm of upper-outer quadrant of left female breast: Secondary | ICD-10-CM | POA: Insufficient documentation

## 2019-07-13 DIAGNOSIS — Z170 Estrogen receptor positive status [ER+]: Secondary | ICD-10-CM | POA: Insufficient documentation

## 2019-07-13 DIAGNOSIS — I890 Lymphedema, not elsewhere classified: Secondary | ICD-10-CM | POA: Diagnosis not present

## 2019-07-13 DIAGNOSIS — M25612 Stiffness of left shoulder, not elsewhere classified: Secondary | ICD-10-CM | POA: Diagnosis not present

## 2019-07-13 DIAGNOSIS — M25512 Pain in left shoulder: Secondary | ICD-10-CM | POA: Insufficient documentation

## 2019-07-13 NOTE — Patient Instructions (Signed)
Access Code: [email or web address] URL: [email or web address] Date: 07/13/2019 Prepared by: Tomma Rakers  Exercises Supine Shoulder Flexion with Dowel - 2 x daily - 7 x weekly - 1 sets - 10 reps Supine Chest Stretch with Elbows Bent - 2 x daily - 7 x weekly - 1 sets - 10 reps Hooklying Shoulder T - 2 x daily - 7 x weekly - 1 sets - 10 reps

## 2019-07-13 NOTE — Therapy (Signed)
[place], [place], [address] Phone: [phone]   Fax:  [phone]  Physical Therapy Evaluation  Patient Details  Name: Stephanie [name] MRN: [id number] Date of Birth: 1966/04/09 Referring Provider (PT): Lurline Del MD   Encounter Date: 07/13/2019  PT End of Session - 07/13/19 1702    Visit Number  1    Number of Visits  9    Date for PT Re-Evaluation  08/17/19    PT Start Time  [id number]    PT Stop Time  1700    PT Time Calculation (min)  53 min    Activity Tolerance  Patient tolerated treatment well    Behavior During Therapy  [facility] for tasks assessed/performed       Past Medical History:  Diagnosis Date  . Anxiety   . Breast mass, left   . Cancer ([place])   . Complication of anesthesia    felt lethargic x 4 days following last surgery in 2017, but 2018 did okay  . Depression   . Headache    migraines  . History of radiation therapy 03/13/19- 04/11/19   Left Breast 16 fraction of 2. 66 Gy each to total 42.56 Gy. Left breast boost 4 fractions of 2 Gy to total 8 Gy.   . Osteopenia     Past Surgical History:  Procedure Laterality Date  . BREAST BIOPSY Left 07/10/2015   high risk stereo  . BREAST EXCISIONAL BIOPSY Left 08/15/2015   ADH  . BREAST LUMPECTOMY WITH RADIOACTIVE SEED LOCALIZATION Left 08/15/2015   Procedure:  LEFT BREAST LUMPECTOMY WITH RADIOACTIVE SEED LOCALIZATION;  Surgeon: Erroll Luna, MD;  Location: [facility];  Service: General;  Laterality: Left;  . BREAST LUMPECTOMY WITH RADIOACTIVE SEED LOCALIZATION Left 02/09/2019   Procedure: LEFT BREAST LUMPECTOMY WITH RADIOACTIVE SEED LOCALIZATION;  Surgeon: [name], MD;  Location: [place];  Service: General;  Laterality: Left;  . EXCISION OF BREAST BIOPSY Left 12/30/2016   Benign ALH sclerosing lesion  . RADIOACTIVE SEED GUIDED EXCISIONAL BREAST BIOPSY Left 12/30/2016   Procedure: RADIOACTIVE SEED GUIDED  EXCISIONAL LEFT BREAST BIOPSY;  Surgeon: [name], MD;  Location: [place];  Service: General;  Laterality: Left;  . RADIOACTIVE SEED GUIDED EXCISIONAL BREAST BIOPSY Left 02/09/2019   Procedure: RADIOACTIVE SEED GUIDED EXCISIONAL LEFT  BREAST BIOPSY;  Surgeon: [name], MD;  Location: [place];  Service: General;  Laterality: Left;  . TONSILLECTOMY  Age 45  . TRIGGER FINGER RELEASE  2010   Right Thumb    There were no vitals filed for this visit.   Subjective Assessment - 07/13/19 1609    Subjective  Pt reports that she wentto see her MD the other day and was telling him that she was very stiff in her L shoulder. He asked her if she had been performing her exercises and she had not been given any at the time because she had not yet been referred to physical therapy. She states that she did not feel this after surgery but started to feel it after radiation. She reports that she didn't do anything about it becuase she nknew she was going to be seeing her MD soon. She reports that she has found some exercises on line that she was doing that do seem to have helped.    Pertinent History  ductal carcinoma in situ with calcifications, intermediate grade with no lymph node removal and radiation on 04/11/2019    How long  can you sit comfortably?  1-2 hours at work before she needs to stretch her arm.    Patient Stated Goals  Maintian and or improve ease of movement and range of motion. ( per patient)    Currently in Pain?  Yes    Pain Score  2     Pain Location  Shoulder    Pain Orientation  Left    Pain Descriptors / Indicators  Aching    Pain Type  Acute pain    Pain Onset  1 to 4 weeks ago    Pain Frequency  Intermittent    Aggravating Factors   getting into end range    Pain Relieving Factors  position changes    Effect of Pain on Daily Activities  it bothers her at work but she is able to work through the pain.         [facility] PT Assessment - 07/13/19 0001       Assessment   Medical Diagnosis  L breast cancer    Referring Provider (PT)  Lurline Del MD    Onset Date/Surgical Date  02/09/19    Hand Dominance  Right    Next MD Visit  10/09/2019    Prior Therapy  None      Precautions   Precaution Comments  hx cancer and radiation/risk for lymphdema       Balance Screen   Has the patient fallen in the past 6 months  No    Has the patient had a decrease in activity level because of a fear of falling?   No    Is the patient reluctant to leave their home because of a fear of falling?   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      Cognition   Overall Cognitive Status  Within Functional Limits for tasks assessed      Observation/Other Assessments   Observations  increased edemain the L breast, small cord from the L axilla to the proximal medial L brachium, scar tissue noted in the L axilla      Posture/Postural Control   Posture/Postural Control  Postural limitations    Postural Limitations  Forward head      ROM / Strength   AROM / PROM / Strength  AROM      AROM   AROM Assessment Site  Shoulder    Right/Left Shoulder  Right;Left    Right Shoulder Flexion  160 Degrees    Right Shoulder ABduction  172 Degrees    Right Shoulder Internal Rotation  87 Degrees    Right Shoulder External Rotation  90 Degrees    Left Shoulder Flexion  165 Degrees    Left Shoulder ABduction  153 Degrees    Left Shoulder Internal Rotation  60 Degrees    Left Shoulder External Rotation  83 Degrees        LYMPHEDEMA/ONCOLOGY QUESTIONNAIRE - 07/13/19 1621      Surgeries   Lumpectomy Date  02/09/19    Number Lymph Nodes Removed  0      Treatment   Active Chemotherapy  Treatment  No  Past Chemotherapy Treatment  No    Active Radiation Treatment  No    Past Radiation Treatment  Yes    Date  04/11/19    Body Site  L breast and axilla    Current Hormone Treatment  Yes    Drug Name  tamoxifen       What other symptoms do you have   Are you Having Heaviness or Tightness  Yes    Are you having Pain  Yes    Are you having pitting edema  No    Is it Hard or Difficult finding clothes that fit  No    Do you have infections  No    Is there Decreased scar mobility  Yes      Lymphedema Assessments   Lymphedema Assessments  Upper extremities      Right Upper Extremity Lymphedema   15 cm Proximal to Olecranon Process  28 cm    10 cm Proximal to Olecranon Process  24.4 cm    Olecranon Process  21.8 cm    15 cm Proximal to Ulnar Styloid Process  20.3 cm    10 cm Proximal to Ulnar Styloid Process  17.5 cm    Just Proximal to Ulnar Styloid Process  12.6 cm    Across Hand at [employer]  17 cm    At [place] of 2nd Digit  5.8 cm      Left Upper Extremity Lymphedema   15 cm Proximal to Olecranon Process  28 cm    10 cm Proximal to Olecranon Process  24.5 cm    Olecranon Process  20.5 cm    15 cm Proximal to Ulnar Styloid Process  19.4 cm    10 cm Proximal to Ulnar Styloid Process  17 cm    Just Proximal to Ulnar Styloid Process  12.5 cm    Across Hand at [employer]  16.7 cm    At [place] of 2nd Digit  5.4 cm             Objective measurements completed on examination: See above findings.      [facility] Adult PT Treatment/Exercise - 07/13/19 0001      Exercises   Exercises  Shoulder      Shoulder Exercises: Supine   Horizontal ABduction  AROM;Both;10 reps    Horizontal ABduction Limitations  VC to not compensate into scaption w/tactile cues and VC to avoid pain.     External Rotation  AROM;Both;10 reps    External Rotation Limitations  abduction/ER with hands behind head; VC to avoid pain and squeeze shoulder blades.     Flexion   AAROM;Both;10 reps    Flexion Limitations  VC for correct movement and to avoid pain             PT Education - 07/13/19 1649    Education Details  Pt was educated on the anatomy and physiology of the lymphatic system. Pt was educated on lymphedema and complete decongestive therapy including skin care, exercise, compression and manual lymph drainage. Pt was educatedon exercises and risk reduction precautions as well as provided with handouts.    Person(s) Educated  Patient    Methods  Explanation;Tactile cues;Verbal cues;Handout    Comprehension  Verbalized understanding;Returned demonstration       PT Short Term Goals - 07/13/19 1708      PT SHORT TERM GOAL #1   Title  Pt will be independent with HEP and self MLD to demonstrate  autonomy of care.    Baseline  Pt currently does not have an HEP or know how to perform MLD    Time  2    Period  Weeks    Status  New    Target Date  08/03/19        PT Long Term Goals - 07/13/19 1713      PT LONG TERM GOAL #1   Title  Patient to be properly fitted with compression garment to wear on daily basis.    Baseline  Pt currently does not have compression    Time  4    Period  Weeks    Status  New    Target Date  08/17/19      PT LONG TERM GOAL #2   Title  Pt will improve L shoulder ROM to 165 degrees abduction and 70 degrees IR in order to demonstrate improve functional ROM.    Baseline  L shoulder abduction 153 and IR 60    Time  4    Period  Weeks    Status  New    Target Date  08/17/19      PT LONG TERM GOAL #3   Title  Pt will report 50% improvement in function and pain at home and work in order to demonstrate an improved quality of life.    Baseline  2/10 pain and pain at end range    Time  4    Period  Weeks    Status  New    Target Date  08/17/19             Plan - 07/13/19 1703    Clinical Impression Statement  Pt presents to physical therapy with reports of pain in her L shoulder. During her evaluation  noticed increased edema in her L breast which pt stated she had noticed in the past month. Pt has no increased in circumferential measurements in her LUE compared to her RUE; swelling seems to be in the posterior L upper quadrant and in the L breast. Pt has slight decrease in L shoulder ROM compared to the R shoulder. One small cord noted in the L axilla and medial brachium. Pt will benefit from skilled physical therapy services 2x/week for 4 weeks in order to decrease risk for immobility related to swelling and loss of ROM.    Personal Factors and Comorbidities  Comorbidity 1    Comorbidities  radiation    Stability/Clinical Decision Making  Stable/Uncomplicated    Clinical Decision Making  Low    Rehab Potential  Excellent    PT Frequency  2x / week    PT Duration  4 weeks    PT Treatment/Interventions  Electrical Stimulation;Iontophoresis 4mg /ml Dexamethasone;Therapeutic activities;Therapeutic exercise;Neuromuscular re-education;Manual techniques    PT Next Visit Plan  begin to perform and teach MLD. Assess exercises, scapular series for strengthening, near end of treatment strenth ABC    PT Home Exercise Plan  Access Code: [email or web address]    Recommended Other Services  compression bra, provided with script    Consulted and Agree with Plan of Care  Patient       Patient will benefit from skilled therapeutic intervention in order to improve the following deficits and impairments:  Decreased range of motion, Increased edema, Postural dysfunction, Decreased knowledge of precautions  Visit Diagnosis: Malignant neoplasm of upper-outer quadrant of left breast in female, estrogen receptor positive ([place]) - Plan: PT plan of care cert/re-cert  Lymphedema, not elsewhere classified -  Plan: PT plan of care cert/re-cert  Acute pain of left shoulder - Plan: PT plan of care cert/re-cert  Stiffness of left shoulder, not elsewhere classified - Plan: PT plan of care cert/re-cert     Problem List Patient  Active Problem List   Diagnosis Date Noted  . Lichen sclerosus et atrophicus 04/18/2019  . Genetic testing 01/20/2019  . Malignant neoplasm of upper-outer quadrant of left breast in female, estrogen receptor positive ([place]) 01/16/2019  . Achilles tendinosis of left lower extremity 11/30/2016  . Depression, major 01/10/2013  . Generalized anxiety disorder 01/10/2013  . Osteoporosis of lumbar spine 04/05/2012  . Attention deficit disorder without mention of hyperactivity 04/05/2012  . Tendonitis of wrist, right 02/05/2011    Ander Purpura, PT 07/13/2019, [place], [place], [address] Phone: [phone]   Fax:  [phone]  Name: CHARLEEN SALSBERRY MRN: [id number] Date of Birth: 03-Apr-1966

## 2019-07-18 ENCOUNTER — Ambulatory Visit: Payer: 59

## 2019-07-18 ENCOUNTER — Other Ambulatory Visit: Payer: Self-pay

## 2019-07-18 DIAGNOSIS — M25512 Pain in left shoulder: Secondary | ICD-10-CM | POA: Diagnosis not present

## 2019-07-18 DIAGNOSIS — I890 Lymphedema, not elsewhere classified: Secondary | ICD-10-CM

## 2019-07-18 DIAGNOSIS — Z170 Estrogen receptor positive status [ER+]: Secondary | ICD-10-CM

## 2019-07-18 DIAGNOSIS — M25612 Stiffness of left shoulder, not elsewhere classified: Secondary | ICD-10-CM | POA: Diagnosis not present

## 2019-07-18 DIAGNOSIS — C50412 Malignant neoplasm of upper-outer quadrant of left female breast: Secondary | ICD-10-CM | POA: Diagnosis not present

## 2019-07-18 NOTE — Therapy (Signed)
[place], [place], [address] Phone: [phone]   Fax:  [phone]  Physical Therapy Treatment  Patient Details  Name: Stephanie [name] MRN: [id number] Date of Birth: 06-Mar-1966 Referring Provider (PT): Lurline Del MD   Encounter Date: 07/18/2019  PT End of Session - 07/18/19 1138    Visit Number  2    Number of Visits  9    Date for PT Re-Evaluation  08/17/19    PT Start Time  0906    PT Stop Time  1007    PT Time Calculation (min)  61 min    Activity Tolerance  Patient tolerated treatment well    Behavior During Therapy  [facility] for tasks assessed/performed       Past Medical History:  Diagnosis Date  . Anxiety   . Breast mass, left   . Cancer ([place])   . Complication of anesthesia    felt lethargic x 4 days following last surgery in 2017, but 2018 did okay  . Depression   . Headache    migraines  . History of radiation therapy 03/13/19- 04/11/19   Left Breast 16 fraction of 2. 66 Gy each to total 42.56 Gy. Left breast boost 4 fractions of 2 Gy to total 8 Gy.   . Osteopenia     Past Surgical History:  Procedure Laterality Date  . BREAST BIOPSY Left 07/10/2015   high risk stereo  . BREAST EXCISIONAL BIOPSY Left 08/15/2015   ADH  . BREAST LUMPECTOMY WITH RADIOACTIVE SEED LOCALIZATION Left 08/15/2015   Procedure:  LEFT BREAST LUMPECTOMY WITH RADIOACTIVE SEED LOCALIZATION;  Surgeon: Erroll Luna, MD;  Location: [place];  Service: General;  Laterality: Left;  . BREAST LUMPECTOMY WITH RADIOACTIVE SEED LOCALIZATION Left 02/09/2019   Procedure: LEFT BREAST LUMPECTOMY WITH RADIOACTIVE SEED LOCALIZATION;  Surgeon: [name], MD;  Location: [place];  Service: General;  Laterality: Left;  . EXCISION OF BREAST BIOPSY Left 12/30/2016   Benign ALH sclerosing lesion  . RADIOACTIVE SEED GUIDED EXCISIONAL BREAST BIOPSY Left 12/30/2016   Procedure: RADIOACTIVE SEED GUIDED  EXCISIONAL LEFT BREAST BIOPSY;  Surgeon: [name], MD;  Location: [place];  Service: General;  Laterality: Left;  . RADIOACTIVE SEED GUIDED EXCISIONAL BREAST BIOPSY Left 02/09/2019   Procedure: RADIOACTIVE SEED GUIDED EXCISIONAL LEFT  BREAST BIOPSY;  Surgeon: [name], MD;  Location: [place];  Service: General;  Laterality: Left;  . TONSILLECTOMY  Age 54  . TRIGGER FINGER RELEASE  2010   Right Thumb    There were no vitals filed for this visit.  Subjective Assessment - 07/18/19 0912    Subjective  I got my compression bra but it rolls up on my waist when I'm sitting so I'm going to return it. May or may not want another one for now.    Pertinent History  ductal carcinoma in situ with calcifications, intermediate grade with no lymph node removal and radiation on 04/11/2019    Patient Stated Goals  Maintian and or improve ease of movement and range of motion. ( per patient)    Currently in Pain?  No/denies                       [facility] Adult PT Treatment/Exercise - 07/18/19 0001      Shoulder Exercises: Supine   Horizontal ABduction  Strengthening;Both;5 reps;Theraband    Theraband Level (Shoulder Horizontal ABduction)  Level 2 (Red)  External Rotation  Strengthening;Both;5 reps;Theraband    Theraband Level (Shoulder External Rotation)  Level 2 (Red)    Internal Rotation  Strengthening;Both;5 reps;Theraband    Theraband Level (Shoulder Internal Rotation)  Level 2 (Red)    Flexion  Strengthening;Both;5 reps;Theraband   Narrow and Wide Grip, 5 times each   Theraband Level (Shoulder Flexion)  Level 2 (Red)    Diagonals  Strengthening;Right;Left;5 reps;Theraband    Theraband Level (Shoulder Diagonals)  Level 2 (Red)    Diagonals Limitations  Pt able to return good demo of all above exercises.      Manual Therapy   Manual Therapy  Manual Lymphatic Drainage (MLD)    Manual Lymphatic Drainage (MLD)  In Supine: Short neck, superficial and  deep abdominals, Lt inguinal and Rt axillary nodes, Lt axillo-inguinal and anterior inter-axillary anastmosis, then focused on Lt breast redirecting towards anastomosis.             PT Education - 07/18/19 1138    Education Details  Supine scapular series with red theraband    Person(s) Educated  Patient    Methods  Explanation;Demonstration;Handout    Comprehension  Verbalized understanding;Returned demonstration       PT Short Term Goals - 07/13/19 1708      PT SHORT TERM GOAL #1   Title  Pt will be independent with HEP and self MLD to demonstrate autonomy of care.    Baseline  Pt currently does not have an HEP or know how to perform MLD    Time  2    Period  Weeks    Status  New    Target Date  08/03/19        PT Long Term Goals - 07/13/19 1713      PT LONG TERM GOAL #1   Title  Patient to be properly fitted with compression garment to wear on daily basis.    Baseline  Pt currently does not have compression    Time  4    Period  Weeks    Status  New    Target Date  08/17/19      PT LONG TERM GOAL #2   Title  Pt will improve L shoulder ROM to 165 degrees abduction and 70 degrees IR in order to demonstrate improve functional ROM.    Baseline  L shoulder abduction 153 and IR 60    Time  4    Period  Weeks    Status  New    Target Date  08/17/19      PT LONG TERM GOAL #3   Title  Pt will report 50% improvement in function and pain at home and work in order to demonstrate an improved quality of life.    Baseline  2/10 pain and pain at end range    Time  4    Period  Weeks    Status  New    Target Date  08/17/19            Plan - 07/18/19 1140    Clinical Impression Statement  Pt came in c/o new compression bra being uncomfortable as it rolls up her waist when she is sitting and wanted to know if she really needed one. So instructed her to try returning/exchanging compression bra for better fitting one. But also instructed her that if she was adamant  about not wanting one her breast lymphedema at this time does semm mild, and she has had no lymph nodes removed so  she may be okay with self manual lymph drainage only and can always return to buy one if lymphedema progresses. Pt verbalized understanding. So began instructing her in basics of anatomy of lymphatic system and principles of MLD today while performing. Also progressed HEP to include supine scapular series which she tolerated very well and reported feeling good stretches into axilla. Pt reports feeling good after session today.    Personal Factors and Comorbidities  Comorbidity 1    Comorbidities  radiation    Stability/Clinical Decision Making  Stable/Uncomplicated    Rehab Potential  Excellent    PT Frequency  2x / week    PT Duration  4 weeks    PT Treatment/Interventions  Electrical Stimulation;Iontophoresis 4mg /ml Dexamethasone;Therapeutic activities;Therapeutic exercise;Neuromuscular re-education;Manual techniques    PT Next Visit Plan  Cont and instruct pt next in MLD, issuing handout. Assess exercises, review prn supine scapular series for strengthening, near end of treatment strenth ABC    Consulted and Agree with Plan of Care  Patient       Patient will benefit from skilled therapeutic intervention in order to improve the following deficits and impairments:  Decreased range of motion, Increased edema, Postural dysfunction, Decreased knowledge of precautions  Visit Diagnosis: Malignant neoplasm of upper-outer quadrant of left breast in female, estrogen receptor positive ([place])  Lymphedema, not elsewhere classified  Acute pain of left shoulder  Stiffness of left shoulder, not elsewhere classified     Problem List Patient Active Problem List   Diagnosis Date Noted  . Lichen sclerosus et atrophicus 04/18/2019  . Genetic testing 01/20/2019  . Malignant neoplasm of upper-outer quadrant of left breast in female, estrogen receptor positive ([place]) 01/16/2019  . Achilles  tendinosis of left lower extremity 11/30/2016  . Depression, major 01/10/2013  . Generalized anxiety disorder 01/10/2013  . Osteoporosis of lumbar spine 04/05/2012  . Attention deficit disorder without mention of hyperactivity 04/05/2012  . Tendonitis of wrist, right 02/05/2011    Otelia Limes, PTA 07/18/2019, 11:47 AM  [place], [place], [address] Phone: [phone]   Fax:  [phone]  Name: Stephanie [name] MRN: [id number] Date of Birth: 1966/02/01

## 2019-07-18 NOTE — Patient Instructions (Signed)

## 2019-07-20 ENCOUNTER — Other Ambulatory Visit: Payer: Self-pay

## 2019-07-20 ENCOUNTER — Ambulatory Visit: Payer: 59

## 2019-07-20 DIAGNOSIS — C50412 Malignant neoplasm of upper-outer quadrant of left female breast: Secondary | ICD-10-CM | POA: Diagnosis not present

## 2019-07-20 DIAGNOSIS — M25512 Pain in left shoulder: Secondary | ICD-10-CM

## 2019-07-20 DIAGNOSIS — I890 Lymphedema, not elsewhere classified: Secondary | ICD-10-CM | POA: Diagnosis not present

## 2019-07-20 DIAGNOSIS — Z170 Estrogen receptor positive status [ER+]: Secondary | ICD-10-CM | POA: Diagnosis not present

## 2019-07-20 DIAGNOSIS — M25612 Stiffness of left shoulder, not elsewhere classified: Secondary | ICD-10-CM

## 2019-07-20 NOTE — Therapy (Addendum)
[place], [place], [address] Phone: [phone]   Fax:  [phone]  Physical Therapy Treatment  Patient Details  Name: Stephanie [name] MRN: [id number] Date of Birth: [birth year] Referring Provider (PT): Lurline Del MD   Encounter Date: 07/20/2019  PT End of Session - 07/20/19 1014    Visit Number  3    Number of Visits  9    Date for PT Re-Evaluation  08/17/19    PT Start Time  0906    PT Stop Time  1005    PT Time Calculation (min)  59 min    Activity Tolerance  Patient tolerated treatment well    Behavior During Therapy  [facility] for tasks assessed/performed       Past Medical History:  Diagnosis Date  . Anxiety   . Breast mass, left   . Cancer ([place])   . Complication of anesthesia    felt lethargic x 4 days following last surgery in 2017, but 2018 did okay  . Depression   . Headache    migraines  . History of radiation therapy 03/13/19- 04/11/19   Left Breast 16 fraction of 2. 66 Gy each to total 42.56 Gy. Left breast boost 4 fractions of 2 Gy to total 8 Gy.   . Osteopenia     Past Surgical History:  Procedure Laterality Date  . BREAST BIOPSY Left 07/10/2015   high risk stereo  . BREAST EXCISIONAL BIOPSY Left 08/15/2015   ADH  . BREAST LUMPECTOMY WITH RADIOACTIVE SEED LOCALIZATION Left 08/15/2015   Procedure:  LEFT BREAST LUMPECTOMY WITH RADIOACTIVE SEED LOCALIZATION;  Surgeon: Erroll Luna, MD;  Location: [place];  Service: General;  Laterality: Left;  . BREAST LUMPECTOMY WITH RADIOACTIVE SEED LOCALIZATION Left 02/09/2019   Procedure: LEFT BREAST LUMPECTOMY WITH RADIOACTIVE SEED LOCALIZATION;  Surgeon: [name], MD;  Location: [place];  Service: General;  Laterality: Left;  . EXCISION OF BREAST BIOPSY Left 12/30/2016   Benign ALH sclerosing lesion  . RADIOACTIVE SEED GUIDED EXCISIONAL BREAST BIOPSY Left 12/30/2016   Procedure: RADIOACTIVE SEED GUIDED  EXCISIONAL LEFT BREAST BIOPSY;  Surgeon: [name], MD;  Location: [place];  Service: General;  Laterality: Left;  . RADIOACTIVE SEED GUIDED EXCISIONAL BREAST BIOPSY Left 02/09/2019   Procedure: RADIOACTIVE SEED GUIDED EXCISIONAL LEFT  BREAST BIOPSY;  Surgeon: [name], MD;  Location: [place];  Service: General;  Laterality: Left;  . TONSILLECTOMY  Age 54  . TRIGGER FINGER RELEASE  2010   Right Thumb    There were no vitals filed for this visit.  Subjective Assessment - 07/20/19 0910    Subjective  I've been doing my stretches throughout the day and already can tell an improvement with my tightness. I tried doing some of the self manaul lymph drainage last night. I was up urinating Monday night after last session which I don't normally do so fluid was definitely moving. And my Lt breast felt lighter yesterday so I guess more fluid had built up than I'd realzed over the past few motnhs.    Pertinent History  ductal carcinoma in situ with calcifications, intermediate grade with no lymph node removal and radiation on 04/11/2019    Patient Stated Goals  Maintian and or improve ease of movement and range of motion. ( per patient)    Currently in Pain?  No/denies  [facility] Adult PT Treatment/Exercise - 07/20/19 0001      Manual Therapy   Manual Therapy  Manual Lymphatic Drainage (MLD);Soft tissue mobilization    Soft tissue mobilization  Scar tissue mobilization and massage at incision and slightly superior to where scar tissue and fibrosis palpable during MLD and instructing pt how to do same.     Manual Lymphatic Drainage (MLD)  In Supine: Short neck, superficial and deep abdominals, Lt inguinal and Rt axillary nodes, Lt axillo-inguinal and anterior inter-axillary anastmosis, then focused on Lt breast redirecting towards anastomosis, then in Rt S/L for further work along [employer] axillo-inguinal and added posterior inter-axillary  anastomosis and focusing on lateral upper breast where fibrosis palpable, then finished retracing anastomosis in supine instructing pt throughout today and having her return demo at varying sequences.              PT Education - 07/20/19 0915    Education Details  Self manual lymph drainage    Person(s) Educated  Patient    Methods  Explanation;Demonstration;Handout    Comprehension  Verbalized understanding;Returned demonstration       PT Short Term Goals - 07/13/19 1708      PT SHORT TERM GOAL #1   Title  Pt will be independent with HEP and self MLD to demonstrate autonomy of care.    Baseline  Pt currently does not have an HEP or know how to perform MLD    Time  2    Period  Weeks    Status  New    Target Date  08/03/19        PT Long Term Goals - 07/13/19 1713      PT LONG TERM GOAL #1   Title  Patient to be properly fitted with compression garment to wear on daily basis.    Baseline  Pt currently does not have compression    Time  4    Period  Weeks    Status  New    Target Date  08/17/19      PT LONG TERM GOAL #2   Title  Pt will improve L shoulder ROM to 165 degrees abduction and 70 degrees IR in order to demonstrate improve functional ROM.    Baseline  L shoulder abduction 153 and IR 60    Time  4    Period  Weeks    Status  New    Target Date  08/17/19      PT LONG TERM GOAL #3   Title  Pt will report 50% improvement in function and pain at home and work in order to demonstrate an improved quality of life.    Baseline  2/10 pain and pain at end range    Time  4    Period  Weeks    Status  New    Target Date  08/17/19            Plan - 07/20/19 1014    Clinical Impression Statement  During manual therapy discussed HEP issued at last session and pt explaining how she is already incorporating all into her day showing good compliance and working towards independence. Then continued with Lt breast manual lymph drainage instructing pt throughout  today during session and having her return demo at varying parts of sequences. She was able to return good skin stretch and was able to show excellent understanding by being able to verbalize sequence and basic principles OF MLD by end of session. She did  require tactile and VCs though for lighter pressure. Issued handout for reinforcement at home.    Personal Factors and Comorbidities  Comorbidity 1    Comorbidities  radiation    Stability/Clinical Decision Making  Stable/Uncomplicated    Rehab Potential  Excellent    PT Frequency  2x / week    PT Duration  4 weeks    PT Treatment/Interventions  Electrical Stimulation;Iontophoresis 4mg /ml Dexamethasone;Therapeutic activities;Therapeutic exercise;Neuromuscular re-education;Manual techniques    PT Next Visit Plan  Did she return compression bra/planning on getting a new one? Cont and review MLD, assess to see if any questions and how is pressure? Add ball roll up wall and maybe pulleys if strtch felt, remeasure A/ROM. Near end of treatment strength ABC    PT Home Exercise Plan  Access Code: [email or web address]    Consulted and Agree with Plan of Care  Patient       Patient will benefit from skilled therapeutic intervention in order to improve the following deficits and impairments:  Decreased range of motion, Increased edema, Postural dysfunction, Decreased knowledge of precautions  Visit Diagnosis: Malignant neoplasm of upper-outer quadrant of left breast in female, estrogen receptor positive ([place])  Lymphedema, not elsewhere classified  Acute pain of left shoulder  Stiffness of left shoulder, not elsewhere classified     Problem List Patient Active Problem List   Diagnosis Date Noted  . Lichen sclerosus et atrophicus 04/18/2019  . Genetic testing 01/20/2019  . Malignant neoplasm of upper-outer quadrant of left breast in female, estrogen receptor positive ([place]) 01/16/2019  . Achilles tendinosis of left lower extremity 11/30/2016  . Depression,  major 01/10/2013  . Generalized anxiety disorder 01/10/2013  . Osteoporosis of lumbar spine 04/05/2012  . Attention deficit disorder without mention of hyperactivity 04/05/2012  . Tendonitis of wrist, right 02/05/2011    Otelia Limes, PTA 07/20/2019, 10:41 AM  [place], [place], [address] Phone: [phone]   Fax:  [phone]  Name: DAMYRIA HOOSIER MRN: [id number] Date of Birth: 22-Sep-1965

## 2019-07-20 NOTE — Patient Instructions (Signed)
Self manual lymph drainage: Perform this sequence once a day.  Only give enough pressure no your skin to make the skin move. Hug yourself.  Do circles at your neck just above your collarbones.  Repeat this 10 times. Diaphragmatic - Supine   Inhale through nose making navel move out toward hands. Exhale through puckered lips, hands follow navel in. Repeat _5__ times. Rest _10__ seconds between repeats.   Axilla - One at a Time   Using full weight of flat hand and fingers at center of uninvolved armpit, make _10__ in-place circles.   LEG: Inguinal Nodes Stimulation   With small finger side of hand against hip crease on involved side, gently perform circles at the crease. Repeat __10_ times.   Copyright  VHI. All rights reserved.  1) Axilla to Inguinal Nodes - Sweep   On involved side, sweep _4__ times from armpit along side of trunk to hip crease.  Now gently stretch skin from the involved side to the uninvolved side across the chest at the shoulder line.  Repeat that 4 times.  Draw an imaginary diagonal line from upper outer breast through the nipple area toward lower inner breast.  Direct fluid upward and inward from this line toward the pathway across your upper chest .  Do this in three rows to treat all of the upper inner breast tissue, and do each row 3-4x.      Direct fluid to treat all of lower outer breast tissue downward and outward toward pathway that is aimed at the left groin.  Finish by doing the pathways as described above going from your involved armpit to the same side groin and going across your upper chest from the involved shoulder to the uninvolved shoulder.  Repeat the steps above where you do circles in your left groin and right armpit.

## 2019-07-22 ENCOUNTER — Encounter: Payer: Self-pay | Admitting: Oncology

## 2019-07-25 ENCOUNTER — Ambulatory Visit: Payer: 59

## 2019-07-25 ENCOUNTER — Other Ambulatory Visit: Payer: Self-pay

## 2019-07-25 DIAGNOSIS — C50412 Malignant neoplasm of upper-outer quadrant of left female breast: Secondary | ICD-10-CM | POA: Diagnosis not present

## 2019-07-25 DIAGNOSIS — M25512 Pain in left shoulder: Secondary | ICD-10-CM | POA: Diagnosis not present

## 2019-07-25 DIAGNOSIS — I890 Lymphedema, not elsewhere classified: Secondary | ICD-10-CM | POA: Diagnosis not present

## 2019-07-25 DIAGNOSIS — M25612 Stiffness of left shoulder, not elsewhere classified: Secondary | ICD-10-CM

## 2019-07-25 DIAGNOSIS — Z170 Estrogen receptor positive status [ER+]: Secondary | ICD-10-CM | POA: Diagnosis not present

## 2019-07-25 NOTE — Therapy (Signed)
[place], [place], [address] Phone: [phone]   Fax:  [phone]  Physical Therapy Treatment  Patient Details  Name: Stephanie [name] MRN: [id number] Date of Birth: 1965-10-15 Referring Provider (PT): Lurline Del MD   Encounter Date: 07/25/2019  PT End of Session - 07/25/19 1004    Visit Number  4    Number of Visits  9    Date for PT Re-Evaluation  08/17/19    PT Start Time  0906    PT Stop Time  1003    PT Time Calculation (min)  57 min    Activity Tolerance  Patient tolerated treatment well    Behavior During Therapy  [facility] for tasks assessed/performed       Past Medical History:  Diagnosis Date  . Anxiety   . Breast mass, left   . Cancer ([place])   . Complication of anesthesia    felt lethargic x 4 days following last surgery in 2017, but 2018 did okay  . Depression   . Headache    migraines  . History of radiation therapy 03/13/19- 04/11/19   Left Breast 16 fraction of 2. 66 Gy each to total 42.56 Gy. Left breast boost 4 fractions of 2 Gy to total 8 Gy.   . Osteopenia     Past Surgical History:  Procedure Laterality Date  . BREAST BIOPSY Left 07/10/2015   high risk stereo  . BREAST EXCISIONAL BIOPSY Left 08/15/2015   ADH  . BREAST LUMPECTOMY WITH RADIOACTIVE SEED LOCALIZATION Left 08/15/2015   Procedure:  LEFT BREAST LUMPECTOMY WITH RADIOACTIVE SEED LOCALIZATION;  Surgeon: Erroll Luna, MD;  Location: [place];  Service: General;  Laterality: Left;  . BREAST LUMPECTOMY WITH RADIOACTIVE SEED LOCALIZATION Left 02/09/2019   Procedure: LEFT BREAST LUMPECTOMY WITH RADIOACTIVE SEED LOCALIZATION;  Surgeon: [name], MD;  Location: [place];  Service: General;  Laterality: Left;  . EXCISION OF BREAST BIOPSY Left 12/30/2016   Benign ALH sclerosing lesion  . RADIOACTIVE SEED GUIDED EXCISIONAL BREAST BIOPSY Left 12/30/2016   Procedure: RADIOACTIVE SEED GUIDED  EXCISIONAL LEFT BREAST BIOPSY;  Surgeon: [name], MD;  Location: [place];  Service: General;  Laterality: Left;  . RADIOACTIVE SEED GUIDED EXCISIONAL BREAST BIOPSY Left 02/09/2019   Procedure: RADIOACTIVE SEED GUIDED EXCISIONAL LEFT  BREAST BIOPSY;  Surgeon: [name], MD;  Location: [place];  Service: General;  Laterality: Left;  . TONSILLECTOMY  Age 54  . TRIGGER FINGER RELEASE  2010   Right Thumb    There were no vitals filed for this visit.  Subjective Assessment - 07/25/19 0910    Subjective  My Lt breast is doing okay. I've been working on the scar tissue as well and can tell an improvement. Also been doing my stretches, the door "hang" is my favorite. Overall I'm doing better.    Pertinent History  ductal carcinoma in situ with calcifications, intermediate grade with no lymph node removal and radiation on 04/11/2019    Patient Stated Goals  Maintian and or improve ease of movement and range of motion. ( per patient)    Currently in Pain?  No/denies                       [facility] Adult PT Treatment/Exercise - 07/25/19 0001      Shoulder Exercises: Therapy Ball   Flexion  Both;10 reps   with dropping Lt shoulder to look under Rt  axilla     Shoulder Exercises: Stretch   Wall Stretch - ABduction  5 reps   back against wall for "snow angel"   Wall Stretch - ABduction Limitations  Pt reports not feeling stretch at chest at end ROM but only some in Lt scapula.      Manual Therapy   Soft tissue mobilization  Scar tissue mobilization and massage at incision and slightly superior to where scar tissue and fibrosis palpable during MLD and instructing pt how to do same.     Manual Lymphatic Drainage (MLD)  In Supine: Short neck, superficial and deep abdominals, Lt inguinal and Rt axillary nodes, Lt axillo-inguinal and anterior inter-axillary anastmosis, then focused on Lt breast redirecting towards anastomosis, then in Rt S/L for further work  along [employer] axillo-inguinal and added posterior inter-axillary anastomosis and focusing on lateral upper breast where fibrosis palpable, then finished retracing anastomosis in supine instructing pt throughout today and having her return demo at varying sequences.                PT Short Term Goals - 07/13/19 1708      PT SHORT TERM GOAL #1   Title  Pt will be independent with HEP and self MLD to demonstrate autonomy of care.    Baseline  Pt currently does not have an HEP or know how to perform MLD    Time  2    Period  Weeks    Status  New    Target Date  08/03/19        PT Long Term Goals - 07/13/19 1713      PT LONG TERM GOAL #1   Title  Patient to be properly fitted with compression garment to wear on daily basis.    Baseline  Pt currently does not have compression    Time  4    Period  Weeks    Status  New    Target Date  08/17/19      PT LONG TERM GOAL #2   Title  Pt will improve L shoulder ROM to 165 degrees abduction and 70 degrees IR in order to demonstrate improve functional ROM.    Baseline  L shoulder abduction 153 and IR 60    Time  4    Period  Weeks    Status  New    Target Date  08/17/19      PT LONG TERM GOAL #3   Title  Pt will report 50% improvement in function and pain at home and work in order to demonstrate an improved quality of life.    Baseline  2/10 pain and pain at end range    Time  4    Period  Weeks    Status  New    Target Date  08/17/19            Plan - 07/25/19 1005    Clinical Impression Statement  Continued with focus on manual therapy but added some active stretching at end of session. Pt was able to feel good stretch with ball roll up wall but none in chest with abduction ("snow angel") on wall and pt was able to demonstrate very good end A/ROM. She reports noting great improvement over weekend with stretching softening her scar tissue at breast incision. This was also noticeably softer during manual therapy as well. Pt is  also planning on getting a better fitting compression bra.    Personal Factors and Comorbidities  Comorbidity 1  Comorbidities  radiation    Stability/Clinical Decision Making  Stable/Uncomplicated    Rehab Potential  Excellent    PT Frequency  2x / week    PT Duration  4 weeks    PT Treatment/Interventions  Electrical Stimulation;Iontophoresis 4mg /ml Dexamethasone;Therapeutic activities;Therapeutic exercise;Neuromuscular re-education;Manual techniques    PT Next Visit Plan  Cont and review MLD, assess to see if any questions and how is pressure? Cont ball roll up wall, remeasure A/ROM. Instruct in strength ABC    Consulted and Agree with Plan of Care  Patient       Patient will benefit from skilled therapeutic intervention in order to improve the following deficits and impairments:  Decreased range of motion, Increased edema, Postural dysfunction, Decreased knowledge of precautions  Visit Diagnosis: Malignant neoplasm of upper-outer quadrant of left breast in female, estrogen receptor positive ([place])  Lymphedema, not elsewhere classified  Acute pain of left shoulder  Stiffness of left shoulder, not elsewhere classified     Problem List Patient Active Problem List   Diagnosis Date Noted  . Lichen sclerosus et atrophicus 04/18/2019  . Genetic testing 01/20/2019  . Malignant neoplasm of upper-outer quadrant of left breast in female, estrogen receptor positive ([place]) 01/16/2019  . Achilles tendinosis of left lower extremity 11/30/2016  . Depression, major 01/10/2013  . Generalized anxiety disorder 01/10/2013  . Osteoporosis of lumbar spine 04/05/2012  . Attention deficit disorder without mention of hyperactivity 04/05/2012  . Tendonitis of wrist, right 02/05/2011    Otelia Limes, PTA 07/25/2019, 11:36 AM  [place], [place], [address] Phone: [phone]   Fax:  [phone]  Name:  Stephanie [name] MRN: [id number] Date of Birth: [birth year]

## 2019-07-27 ENCOUNTER — Other Ambulatory Visit: Payer: Self-pay

## 2019-07-27 ENCOUNTER — Ambulatory Visit: Payer: 59 | Attending: Oncology

## 2019-07-27 DIAGNOSIS — M25512 Pain in left shoulder: Secondary | ICD-10-CM | POA: Insufficient documentation

## 2019-07-27 DIAGNOSIS — C50412 Malignant neoplasm of upper-outer quadrant of left female breast: Secondary | ICD-10-CM | POA: Diagnosis not present

## 2019-07-27 DIAGNOSIS — I890 Lymphedema, not elsewhere classified: Secondary | ICD-10-CM | POA: Insufficient documentation

## 2019-07-27 DIAGNOSIS — M25612 Stiffness of left shoulder, not elsewhere classified: Secondary | ICD-10-CM | POA: Diagnosis not present

## 2019-07-27 DIAGNOSIS — Z170 Estrogen receptor positive status [ER+]: Secondary | ICD-10-CM | POA: Diagnosis not present

## 2019-07-27 NOTE — Therapy (Signed)
[place], [place], [address] Phone: [phone]   Fax:  [phone]  Physical Therapy Treatment  Patient Details  Name: Stephanie [name] MRN: [id number] Date of Birth: 12/04/1965 Referring Provider (PT): Lurline Del MD   Encounter Date: 07/27/2019  PT End of Session - 07/27/19 1102    Visit Number  5    Number of Visits  9    Date for PT Re-Evaluation  08/17/19    PT Start Time  1007    PT Stop Time  1106    PT Time Calculation (min)  59 min    Activity Tolerance  Patient tolerated treatment well    Behavior During Therapy  [facility] for tasks assessed/performed       Past Medical History:  Diagnosis Date  . Anxiety   . Breast mass, left   . Cancer ([place])   . Complication of anesthesia    felt lethargic x 4 days following last surgery in 2017, but 2018 did okay  . Depression   . Headache    migraines  . History of radiation therapy 03/13/19- 04/11/19   Left Breast 16 fraction of 2. 66 Gy each to total 42.56 Gy. Left breast boost 4 fractions of 2 Gy to total 8 Gy.   . Osteopenia     Past Surgical History:  Procedure Laterality Date  . BREAST BIOPSY Left 07/10/2015   high risk stereo  . BREAST EXCISIONAL BIOPSY Left 08/15/2015   ADH  . BREAST LUMPECTOMY WITH RADIOACTIVE SEED LOCALIZATION Left 08/15/2015   Procedure:  LEFT BREAST LUMPECTOMY WITH RADIOACTIVE SEED LOCALIZATION;  Surgeon: Erroll Luna, MD;  Location: [place];  Service: General;  Laterality: Left;  . BREAST LUMPECTOMY WITH RADIOACTIVE SEED LOCALIZATION Left 02/09/2019   Procedure: LEFT BREAST LUMPECTOMY WITH RADIOACTIVE SEED LOCALIZATION;  Surgeon: [name], MD;  Location: [place];  Service: General;  Laterality: Left;  . EXCISION OF BREAST BIOPSY Left 12/30/2016   Benign ALH sclerosing lesion  . RADIOACTIVE SEED GUIDED EXCISIONAL BREAST BIOPSY Left 12/30/2016   Procedure: RADIOACTIVE SEED GUIDED  EXCISIONAL LEFT BREAST BIOPSY;  Surgeon: [name], MD;  Location: [place];  Service: General;  Laterality: Left;  . RADIOACTIVE SEED GUIDED EXCISIONAL BREAST BIOPSY Left 02/09/2019   Procedure: RADIOACTIVE SEED GUIDED EXCISIONAL LEFT  BREAST BIOPSY;  Surgeon: [name], MD;  Location: [place];  Service: General;  Laterality: Left;  . TONSILLECTOMY  Age 29  . TRIGGER FINGER RELEASE  2010   Right Thumb    There were no vitals filed for this visit.  Subjective Assessment - 07/27/19 1012    Subjective  When I do the self MLD I feel like I'm doing it good. The video and then your instruction was very helpful.    Pertinent History  ductal carcinoma in situ with calcifications, intermediate grade with no lymph node removal and radiation on 04/11/2019    Patient Stated Goals  Maintian and or improve ease of movement and range of motion. ( per patient)    Currently in Pain?  No/denies                       [facility] Adult PT Treatment/Exercise - 07/27/19 0001      Exercises   Exercises  Other Exercises    Other Exercises   Began instruction of Strength ABC program getting through all stretches holding all 15 sec, 1 rep each , then  core exercises x10 each, returning therapist demo throughout.      Manual Therapy   Soft tissue mobilization  Scar tissue mobilization and massage at incision and slightly superior to where scar tissue and fibrosis palpable during MLD and instructing pt how to do same.     Manual Lymphatic Drainage (MLD)  In Supine: Short neck, superficial and deep abdominals, Lt inguinal and Rt axillary nodes, Lt axillo-inguinal and anterior inter-axillary anastmosis, then focused on Lt breast redirecting towards anastomosis, then in Rt S/L for further work along [employer] axillo-inguinal and added posterior inter-axillary anastomosis and focusing on lateral upper breast where fibrosis palpable, then finished retracing anastomosis in supine  instructing pt throughout today and having her return demo at varying sequences.                PT Short Term Goals - 07/13/19 1708      PT SHORT TERM GOAL #1   Title  Pt will be independent with HEP and self MLD to demonstrate autonomy of care.    Baseline  Pt currently does not have an HEP or know how to perform MLD    Time  2    Period  Weeks    Status  New    Target Date  08/03/19        PT Long Term Goals - 07/13/19 1713      PT LONG TERM GOAL #1   Title  Patient to be properly fitted with compression garment to wear on daily basis.    Baseline  Pt currently does not have compression    Time  4    Period  Weeks    Status  New    Target Date  08/17/19      PT LONG TERM GOAL #2   Title  Pt will improve L shoulder ROM to 165 degrees abduction and 70 degrees IR in order to demonstrate improve functional ROM.    Baseline  L shoulder abduction 153 and IR 60    Time  4    Period  Weeks    Status  New    Target Date  08/17/19      PT LONG TERM GOAL #3   Title  Pt will report 50% improvement in function and pain at home and work in order to demonstrate an improved quality of life.    Baseline  2/10 pain and pain at end range    Time  4    Period  Weeks    Status  New    Target Date  08/17/19            Plan - 07/27/19 1106    Clinical Impression Statement  Pt is doing very well with independence with self MLD and stretching at home so progressed her today to begin instruction of Strength ABC program. She tolerated this very well and she will benefit from continued instruction of this at next session.    Personal Factors and Comorbidities  Comorbidity 1    Comorbidities  radiation    Stability/Clinical Decision Making  Stable/Uncomplicated    Rehab Potential  Excellent    PT Frequency  2x / week    PT Duration  4 weeks    PT Treatment/Interventions  Electrical Stimulation;Iontophoresis 4mg /ml Dexamethasone;Therapeutic activities;Therapeutic  exercise;Neuromuscular re-education;Manual techniques    PT Next Visit Plan  Complete instruction of Strenth ABC program resuming at chest presses. Cont manual therapy including MLD to Lt breast as time allows.  PT Home Exercise Plan  Access Code: [email or web address]; Strength ABC program    Consulted and Agree with Plan of Care  Patient       Patient will benefit from skilled therapeutic intervention in order to improve the following deficits and impairments:  Decreased range of motion, Increased edema, Postural dysfunction, Decreased knowledge of precautions  Visit Diagnosis: Malignant neoplasm of upper-outer quadrant of left breast in female, estrogen receptor positive ([place])  Lymphedema, not elsewhere classified  Acute pain of left shoulder  Stiffness of left shoulder, not elsewhere classified     Problem List Patient Active Problem List   Diagnosis Date Noted  . Lichen sclerosus et atrophicus 04/18/2019  . Genetic testing 01/20/2019  . Malignant neoplasm of upper-outer quadrant of left breast in female, estrogen receptor positive ([place]) 01/16/2019  . Achilles tendinosis of left lower extremity 11/30/2016  . Depression, major 01/10/2013  . Generalized anxiety disorder 01/10/2013  . Osteoporosis of lumbar spine 04/05/2012  . Attention deficit disorder without mention of hyperactivity 04/05/2012  . Tendonitis of wrist, right 02/05/2011    Otelia Limes, PTA 07/27/2019, 11:22 AM  [place], [place], [address] Phone: [phone]   Fax:  [phone]  Name: Stephanie [name] MRN: [id number] Date of Birth: 01/15/1966

## 2019-07-31 MED FILL — ESTRING 2 MG VAGINAL RING: 2 | 90 days supply | Qty: 1 | Fill #1

## 2019-07-31 MED FILL — TAMOXIFEN 20 MG TABLET: 20 | 90 days supply | Qty: 90 | Fill #1

## 2019-08-01 ENCOUNTER — Ambulatory Visit: Payer: 59

## 2019-08-01 ENCOUNTER — Other Ambulatory Visit: Payer: Self-pay

## 2019-08-01 DIAGNOSIS — Z170 Estrogen receptor positive status [ER+]: Secondary | ICD-10-CM | POA: Diagnosis not present

## 2019-08-01 DIAGNOSIS — I890 Lymphedema, not elsewhere classified: Secondary | ICD-10-CM

## 2019-08-01 DIAGNOSIS — C50412 Malignant neoplasm of upper-outer quadrant of left female breast: Secondary | ICD-10-CM

## 2019-08-01 DIAGNOSIS — M25612 Stiffness of left shoulder, not elsewhere classified: Secondary | ICD-10-CM

## 2019-08-01 DIAGNOSIS — M25512 Pain in left shoulder: Secondary | ICD-10-CM | POA: Diagnosis not present

## 2019-08-01 NOTE — Therapy (Signed)
[place], [place], [address] Phone: [phone]   Fax:  [phone]  Physical Therapy Treatment  Patient Details  Name: Stephanie [name] MRN: [id number] Date of Birth: 02/13/1966 Referring Provider (PT): Lurline Del MD   Encounter Date: 08/01/2019  PT End of Session - 08/01/19 0907    Visit Number  6    Number of Visits  9    Date for PT Re-Evaluation  08/17/19    PT Start Time  0906    PT Stop Time  1000    PT Time Calculation (min)  54 min    Activity Tolerance  Patient tolerated treatment well    Behavior During Therapy  [facility] for tasks assessed/performed       Past Medical History:  Diagnosis Date  . Anxiety   . Breast mass, left   . Cancer ([place])   . Complication of anesthesia    felt lethargic x 4 days following last surgery in 2017, but 2018 did okay  . Depression   . Headache    migraines  . History of radiation therapy 03/13/19- 04/11/19   Left Breast 16 fraction of 2. 66 Gy each to total 42.56 Gy. Left breast boost 4 fractions of 2 Gy to total 8 Gy.   . Osteopenia     Past Surgical History:  Procedure Laterality Date  . BREAST BIOPSY Left 07/10/2015   high risk stereo  . BREAST EXCISIONAL BIOPSY Left 08/15/2015   ADH  . BREAST LUMPECTOMY WITH RADIOACTIVE SEED LOCALIZATION Left 08/15/2015   Procedure:  LEFT BREAST LUMPECTOMY WITH RADIOACTIVE SEED LOCALIZATION;  Surgeon: Erroll Luna, MD;  Location: [place];  Service: General;  Laterality: Left;  . BREAST LUMPECTOMY WITH RADIOACTIVE SEED LOCALIZATION Left 02/09/2019   Procedure: LEFT BREAST LUMPECTOMY WITH RADIOACTIVE SEED LOCALIZATION;  Surgeon: [name], MD;  Location: [place];  Service: General;  Laterality: Left;  . EXCISION OF BREAST BIOPSY Left 12/30/2016   Benign ALH sclerosing lesion  . RADIOACTIVE SEED GUIDED EXCISIONAL BREAST BIOPSY Left 12/30/2016   Procedure: RADIOACTIVE SEED GUIDED  EXCISIONAL LEFT BREAST BIOPSY;  Surgeon: [name], MD;  Location: [place];  Service: General;  Laterality: Left;  . RADIOACTIVE SEED GUIDED EXCISIONAL BREAST BIOPSY Left 02/09/2019   Procedure: RADIOACTIVE SEED GUIDED EXCISIONAL LEFT  BREAST BIOPSY;  Surgeon: [name], MD;  Location: [facility];  Service: General;  Laterality: Left;  . TONSILLECTOMY  Age 54  . TRIGGER FINGER RELEASE  2010   Right Thumb    There were no vitals filed for this visit.  Subjective Assessment - 08/01/19 0907    Subjective  Pt states that she wished she knew when her breast was swelling but she has a hard time due to it does not feel heavy or aching but it is definitely swollen compared to the size after her surgery.    Pertinent History  ductal carcinoma in situ with calcifications, intermediate grade with no lymph node removal and radiation on 04/11/2019    How long can you sit comfortably?  1-2 hours at work before she needs to stretch her arm.    Patient Stated Goals  Maintain and or improve ease of movement and range of motion. ( per patient)    Currently in Pain?  No/denies    Pain Score  0-No pain  [facility] Adult PT Treatment/Exercise - 08/01/19 0001      Exercises   Exercises  Other Exercises    Other Exercises   Finished ABC strength program starting at chest press with 2# for all weights used in exercises and VC/tactile cueing for activated core throughout exercises as well as VC and demonstration for correct spinal alignment throughout all exercises.       Manual Therapy   Manual Therapy  Manual Lymphatic Drainage (MLD)    Manual Lymphatic Drainage (MLD)  In supine: short neck, swimming inthe terminus, Bil shoulder collectors, Bil axillary and L inguinal nodes, L axillo-inguinal anastomosis and anterior inter-axillary anastomosis, L breast towrad anastomosis, re-worked all surfaces then deep abdominals             PT Education  - 08/01/19 1009    Education Details  Pt finished strength ABC packet this session with demonstration VC and tactile cueing.    Person(s) Educated  Patient    Methods  Explanation;Demonstration;Verbal cues;Tactile cues    Comprehension  Verbalized understanding;Returned demonstration       PT Short Term Goals - 07/13/19 1708      PT SHORT TERM GOAL #1   Title  Pt will be independent with HEP and self MLD to demonstrate autonomy of care.    Baseline  Pt currently does not have an HEP or know how to perform MLD    Time  2    Period  Weeks    Status  New    Target Date  08/03/19        PT Long Term Goals - 07/13/19 1713      PT LONG TERM GOAL #1   Title  Patient to be properly fitted with compression garment to wear on daily basis.    Baseline  Pt currently does not have compression    Time  4    Period  Weeks    Status  New    Target Date  08/17/19      PT LONG TERM GOAL #2   Title  Pt will improve L shoulder ROM to 165 degrees abduction and 70 degrees IR in order to demonstrate improve functional ROM.    Baseline  L shoulder abduction 153 and IR 60    Time  4    Period  Weeks    Status  New    Target Date  08/17/19      PT LONG TERM GOAL #3   Title  Pt will report 50% improvement in function and pain at home and work in order to demonstrate an improved quality of life.    Baseline  2/10 pain and pain at end range    Time  4    Period  Weeks    Status  New    Target Date  08/17/19            Plan - 08/01/19 0907    Clinical Impression Statement  Finished strength ABC program today with pt requiring VC especially for core activation throughout session in order to maintain good spinal alignment. Discussed neural input to muscle vs. muscle hypertrophy with exercise. MLD was performed to the L breast with minimal improvement; discussed when she will see her surgeon to determine draining of the seroma again. Pt was signed up for an appointment to get fittedby Melissa  the rep from [facility] in order to get an appropriate bra. Pt will benefit from continued POC at this time.  Personal Factors and Comorbidities  Comorbidity 1    Comorbidities  radiation    Rehab Potential  Excellent    PT Frequency  2x / week    PT Duration  4 weeks    PT Treatment/Interventions  Electrical Stimulation;Iontophoresis 4mg /ml Dexamethasone;Therapeutic activities;Therapeutic exercise;Neuromuscular re-education;Manual techniques    PT Next Visit Plan  Cont manual therapy including MLD to Lt breast as time allows.    PT Home Exercise Plan  Access Code: [email or web address]; Strength ABC program    Recommended Other Services  signed up for measurements for bra with Melissa due to bra from [employer] was not adequate.    Consulted and Agree with Plan of Care  Patient       Patient will benefit from skilled therapeutic intervention in order to improve the following deficits and impairments:  Decreased range of motion, Increased edema, Postural dysfunction, Decreased knowledge of precautions  Visit Diagnosis: Malignant neoplasm of upper-outer quadrant of left breast in female, estrogen receptor positive ([place])  Lymphedema, not elsewhere classified  Acute pain of left shoulder  Stiffness of left shoulder, not elsewhere classified     Problem List Patient Active Problem List   Diagnosis Date Noted  . Lichen sclerosus et atrophicus 04/18/2019  . Genetic testing 01/20/2019  . Malignant neoplasm of upper-outer quadrant of left breast in female, estrogen receptor positive ([place]) 01/16/2019  . Achilles tendinosis of left lower extremity 11/30/2016  . Depression, major 01/10/2013  . Generalized anxiety disorder 01/10/2013  . Osteoporosis of lumbar spine 04/05/2012  . Attention deficit disorder without mention of hyperactivity 04/05/2012  . Tendonitis of wrist, right 02/05/2011    Ander Purpura, PT 08/01/2019, 10:26 AM  [place], [place], [address] Phone: [phone]   Fax:  [phone]  Name: Stephanie [name] MRN: [id number] Date of Birth: 01-02-1966

## 2019-08-03 ENCOUNTER — Other Ambulatory Visit: Payer: Self-pay

## 2019-08-03 ENCOUNTER — Ambulatory Visit: Payer: 59

## 2019-08-03 DIAGNOSIS — M25612 Stiffness of left shoulder, not elsewhere classified: Secondary | ICD-10-CM | POA: Diagnosis not present

## 2019-08-03 DIAGNOSIS — M25512 Pain in left shoulder: Secondary | ICD-10-CM | POA: Diagnosis not present

## 2019-08-03 DIAGNOSIS — Z170 Estrogen receptor positive status [ER+]: Secondary | ICD-10-CM | POA: Diagnosis not present

## 2019-08-03 DIAGNOSIS — I890 Lymphedema, not elsewhere classified: Secondary | ICD-10-CM | POA: Diagnosis not present

## 2019-08-03 DIAGNOSIS — C50412 Malignant neoplasm of upper-outer quadrant of left female breast: Secondary | ICD-10-CM | POA: Diagnosis not present

## 2019-08-03 NOTE — Therapy (Signed)
[place], [place], [address] Phone: [phone]   Fax:  [phone]  Physical Therapy Treatment  Patient Details  Name: Stephanie [name] MRN: [id number] Date of Birth: 08-Aug-1965 Referring Provider (PT): Lurline Del MD   Encounter Date: 08/03/2019  PT End of Session - 08/03/19 0905    Visit Number  7    Number of Visits  9    Date for PT Re-Evaluation  08/17/19    PT Start Time  0904    PT Stop Time  0958    PT Time Calculation (min)  54 min    Activity Tolerance  Patient tolerated treatment well    Behavior During Therapy  [facility] for tasks assessed/performed       Past Medical History:  Diagnosis Date  . Anxiety   . Breast mass, left   . Cancer ([place])   . Complication of anesthesia    felt lethargic x 4 days following last surgery in 2017, but 2018 did okay  . Depression   . Headache    migraines  . History of radiation therapy 03/13/19- 04/11/19   Left Breast 16 fraction of 2. 66 Gy each to total 42.56 Gy. Left breast boost 4 fractions of 2 Gy to total 8 Gy.   . Osteopenia     Past Surgical History:  Procedure Laterality Date  . BREAST BIOPSY Left 07/10/2015   high risk stereo  . BREAST EXCISIONAL BIOPSY Left 08/15/2015   ADH  . BREAST LUMPECTOMY WITH RADIOACTIVE SEED LOCALIZATION Left 08/15/2015   Procedure:  LEFT BREAST LUMPECTOMY WITH RADIOACTIVE SEED LOCALIZATION;  Surgeon: Erroll Luna, MD;  Location: [place];  Service: General;  Laterality: Left;  . BREAST LUMPECTOMY WITH RADIOACTIVE SEED LOCALIZATION Left 02/09/2019   Procedure: LEFT BREAST LUMPECTOMY WITH RADIOACTIVE SEED LOCALIZATION;  Surgeon: [name], MD;  Location: [place];  Service: General;  Laterality: Left;  . EXCISION OF BREAST BIOPSY Left 12/30/2016   Benign ALH sclerosing lesion  . RADIOACTIVE SEED GUIDED EXCISIONAL BREAST BIOPSY Left 12/30/2016   Procedure: RADIOACTIVE SEED GUIDED  EXCISIONAL LEFT BREAST BIOPSY;  Surgeon: [name], MD;  Location: [facility];  Service: General;  Laterality: Left;  . RADIOACTIVE SEED GUIDED EXCISIONAL BREAST BIOPSY Left 02/09/2019   Procedure: RADIOACTIVE SEED GUIDED EXCISIONAL LEFT  BREAST BIOPSY;  Surgeon: [name], MD;  Location: [place];  Service: General;  Laterality: Left;  . TONSILLECTOMY  Age 54  . TRIGGER FINGER RELEASE  2010   Right Thumb    There were no vitals filed for this visit.  Subjective Assessment - 08/03/19 0906    Subjective  Pt states that her muscles are a little sore after her last session using the weights. She states that she has been more conscience of her shoulders and trying to keep them down. Pt states that after the first time MLD was performed she has not noticed a big difference with fluid reduction when doing MLD or after it is performed in physical therapy.    Pertinent History  ductal carcinoma in situ with calcifications, intermediate grade with no lymph node removal and radiation on 04/11/2019    How long can you sit comfortably?  1-2 hours at work before she needs to stretch her arm.    Patient Stated Goals  Maintain and or improve ease of movement and range of motion. ( per patient)    Currently in Pain?  No/denies    Pain  Score  0-No pain            LYMPHEDEMA/ONCOLOGY QUESTIONNAIRE - 08/03/19 1023      Left Upper Extremity Lymphedema   At [facility] of 2nd Digit  8 cm                [facility] Adult PT Treatment/Exercise - 08/03/19 0001      Manual Therapy   Manual Therapy  Manual Lymphatic Drainage (MLD);Myofascial release    Myofascial Release  Myofascial release along the lateral L breast, then from inferior lateral L breast to the axilla, across the superior chest wall, from inferior medial breast to superior chest wall/shoulder, in side-lying along the lateral trunk wall into the L axilla, then back into supine across the L breast from medial inferior  corder to the lateral/superior corner, scar tissue noted along the lateral curvature and medial curvature of the L breast and into the L axilla from the lateral side. Tightness noted in fascia that decreased minimally following myofascial release.     Manual Lymphatic Drainage (MLD)  In supine: short neck, swimming in the terminus, Bil shoulder collectors, Bil axillary and L inguinal nodes, L axillo-inguinal anastomosis and anterior inter-axillary anastomosis, L breast superior/inferior toward appropriate anastomosis, re-worked all surfaces then deep abdominals with extra time spent at the breast following myofascial release             PT Education - 08/03/19 1031    Education Details  Educated pt on scar tissue and discussed mobilizing the fascia in order to help fluid flow out of the area. Educated on the importance of a compression bra and she is coming tomorrow to be fit for a compression bra by the [employer].    Person(s) Educated  Patient    Methods  Explanation    Comprehension  Verbalized understanding       PT Short Term Goals - 07/13/19 1708      PT SHORT TERM GOAL #1   Title  Pt will be independent with HEP and self MLD to demonstrate autonomy of care.    Baseline  Pt currently does not have an HEP or know how to perform MLD    Time  2    Period  Weeks    Status  New    Target Date  08/03/19        PT Long Term Goals - 07/13/19 1713      PT LONG TERM GOAL #1   Title  Patient to be properly fitted with compression garment to wear on daily basis.    Baseline  Pt currently does not have compression    Time  4    Period  Weeks    Status  New    Target Date  08/17/19      PT LONG TERM GOAL #2   Title  Pt will improve L shoulder ROM to 165 degrees abduction and 70 degrees IR in order to demonstrate improve functional ROM.    Baseline  L shoulder abduction 153 and IR 60    Time  4    Period  Weeks    Status  New    Target Date  08/17/19      PT LONG  TERM GOAL #3   Title  Pt will report 50% improvement in function and pain at home and work in order to demonstrate an improved quality of life.    Baseline  2/10 pain and pain at end range  Time  4    Period  Weeks    Status  New    Target Date  08/17/19            Plan - 08/03/19 0905    Clinical Impression Statement  Due to pt reports of no reduction in fluid after performing MLD myofascial release was done over the L breat, lateral trunk wall, anterior chest in order to decrease any adhesions and facilitate fluid flow out of the area. Minimal improvement in fluid in the L breast following myofascial release with multiple areas noted of tissue adhesion in the medial/lateral breast and from the lateral breast into the L axilla; minimal improvement following myofascial release. MLD was performed following myofascial release with minimal improvement. Discussed continueing MLD at home and reporting if she notices a difference in fluid movement. Pt will benefit from current POC at this time.    Personal Factors and Comorbidities  Comorbidity 1    Comorbidities  radiation    Rehab Potential  Excellent    PT Frequency  2x / week    PT Duration  4 weeks    PT Treatment/Interventions  Electrical Stimulation;Iontophoresis 4mg /ml Dexamethasone;Therapeutic activities;Therapeutic exercise;Neuromuscular re-education;Manual techniques    PT Next Visit Plan  myofascial release, ask if better fluid movement with MLD. Cont manual therapy including MLD to Lt breast as time allows. Ask about strength ABC    PT Home Exercise Plan  Access Code: [email or web address]; Strength ABC program    Consulted and Agree with Plan of Care  Patient       Patient will benefit from skilled therapeutic intervention in order to improve the following deficits and impairments:  Decreased range of motion, Increased edema, Postural dysfunction, Decreased knowledge of precautions  Visit Diagnosis: Malignant neoplasm of upper-outer  quadrant of left breast in female, estrogen receptor positive ([facility])  Lymphedema, not elsewhere classified  Acute pain of left shoulder  Stiffness of left shoulder, not elsewhere classified     Problem List Patient Active Problem List   Diagnosis Date Noted  . Lichen sclerosus et atrophicus 04/18/2019  . Genetic testing 01/20/2019  . Malignant neoplasm of upper-outer quadrant of left breast in female, estrogen receptor positive ([place]) 01/16/2019  . Achilles tendinosis of left lower extremity 11/30/2016  . Depression, major 01/10/2013  . Generalized anxiety disorder 01/10/2013  . Osteoporosis of lumbar spine 04/05/2012  . Attention deficit disorder without mention of hyperactivity 04/05/2012  . Tendonitis of wrist, right 02/05/2011    Ander Purpura, PT 08/03/2019, 10:40 AM  [place], [place], [address] Phone: [phone]   Fax:  [phone]  Name: Stephanie [name] MRN: [id number] Date of Birth: 1965-10-08

## 2019-08-08 ENCOUNTER — Other Ambulatory Visit: Payer: Self-pay

## 2019-08-08 ENCOUNTER — Ambulatory Visit: Payer: 59

## 2019-08-08 DIAGNOSIS — C50412 Malignant neoplasm of upper-outer quadrant of left female breast: Secondary | ICD-10-CM

## 2019-08-08 DIAGNOSIS — M25612 Stiffness of left shoulder, not elsewhere classified: Secondary | ICD-10-CM

## 2019-08-08 DIAGNOSIS — M25512 Pain in left shoulder: Secondary | ICD-10-CM

## 2019-08-08 DIAGNOSIS — Z170 Estrogen receptor positive status [ER+]: Secondary | ICD-10-CM | POA: Diagnosis not present

## 2019-08-08 DIAGNOSIS — I890 Lymphedema, not elsewhere classified: Secondary | ICD-10-CM | POA: Diagnosis not present

## 2019-08-08 NOTE — Therapy (Signed)
[place], [place], [address] Phone: [phone]   Fax:  [phone]  Physical Therapy Treatment  Patient Details  Name: Stephanie [name] MRN: [id number] Date of Birth: 06/05/1965 Referring Provider (PT): Lurline Del MD   Encounter Date: 08/08/2019  PT End of Session - 08/08/19 1006    Visit Number  8    Number of Visits  9    Date for PT Re-Evaluation  08/17/19    PT Start Time  0911   pt arrived late   PT Stop Time  1005    PT Time Calculation (min)  54 min    Activity Tolerance  Patient tolerated treatment well    Behavior During Therapy  [facility] for tasks assessed/performed       Past Medical History:  Diagnosis Date  . Anxiety   . Breast mass, left   . Cancer ([place])   . Complication of anesthesia    felt lethargic x 4 days following last surgery in 2017, but 2018 did okay  . Depression   . Headache    migraines  . History of radiation therapy 03/13/19- 04/11/19   Left Breast 16 fraction of 2. 66 Gy each to total 42.56 Gy. Left breast boost 4 fractions of 2 Gy to total 8 Gy.   . Osteopenia     Past Surgical History:  Procedure Laterality Date  . BREAST BIOPSY Left 07/10/2015   high risk stereo  . BREAST EXCISIONAL BIOPSY Left 08/15/2015   ADH  . BREAST LUMPECTOMY WITH RADIOACTIVE SEED LOCALIZATION Left 08/15/2015   Procedure:  LEFT BREAST LUMPECTOMY WITH RADIOACTIVE SEED LOCALIZATION;  Surgeon: Erroll Luna, MD;  Location: [place];  Service: General;  Laterality: Left;  . BREAST LUMPECTOMY WITH RADIOACTIVE SEED LOCALIZATION Left 02/09/2019   Procedure: LEFT BREAST LUMPECTOMY WITH RADIOACTIVE SEED LOCALIZATION;  Surgeon: [name], MD;  Location: [place];  Service: General;  Laterality: Left;  . EXCISION OF BREAST BIOPSY Left 12/30/2016   Benign ALH sclerosing lesion  . RADIOACTIVE SEED GUIDED EXCISIONAL BREAST BIOPSY Left 12/30/2016   Procedure: RADIOACTIVE  SEED GUIDED EXCISIONAL LEFT BREAST BIOPSY;  Surgeon: [name], MD;  Location: [place];  Service: General;  Laterality: Left;  . RADIOACTIVE SEED GUIDED EXCISIONAL BREAST BIOPSY Left 02/09/2019   Procedure: RADIOACTIVE SEED GUIDED EXCISIONAL LEFT  BREAST BIOPSY;  Surgeon: [name], MD;  Location: [place];  Service: General;  Laterality: Left;  . TONSILLECTOMY  Age 70  . TRIGGER FINGER RELEASE  2010   Right Thumb    There were no vitals filed for this visit.  Subjective Assessment - 08/08/19 0912    Subjective  I did all my exercises last night and they went well. I'm still not noticing a huge difference with the reduction of breast fluid. I did though notice some better reduction after last session, but it came back and I have trouble getting in gone myself.    Pertinent History  ductal carcinoma in situ with calcifications, intermediate grade with no lymph node removal and radiation on 04/11/2019    Patient Stated Goals  Maintain and or improve ease of movement and range of motion. ( per patient)    Currently in Pain?  No/denies                       [facility] Adult PT Treatment/Exercise - 08/08/19 0001      Manual Therapy   Manual  Therapy  Manual Lymphatic Drainage (MLD);Myofascial release    Myofascial Release  Myofascial release along the lateral L breast, then from inferior lateral L breast to the axilla, across the superior chest wall, from inferior medial breast to superior chest wall/shoulder, in side-lying along the lateral trunk wall into the L axilla, then back into supine across the L breast from medial inferior corder to the lateral/superior corner, scar tissue noted along the lateral curvature and medial curvature of the L breast and into the L axilla from the lateral side.    Manual Lymphatic Drainage (MLD)  In supine: short neck, swimming in the terminus, Bil shoulder collectors, Bil axillary and L inguinal nodes, L axillo-inguinal  anastomosis and anterior inter-axillary anastomosis, L breast superior/inferior toward appropriate anastomosis, re-worked all surfaces then deep abdominals with extra time spent at the breast following myofascial release               PT Short Term Goals - 07/13/19 1708      PT SHORT TERM GOAL #1   Title  Pt will be independent with HEP and self MLD to demonstrate autonomy of care.    Baseline  Pt currently does not have an HEP or know how to perform MLD    Time  2    Period  Weeks    Status  New    Target Date  08/03/19        PT Long Term Goals - 07/13/19 1713      PT LONG TERM GOAL #1   Title  Patient to be properly fitted with compression garment to wear on daily basis.    Baseline  Pt currently does not have compression    Time  4    Period  Weeks    Status  New    Target Date  08/17/19      PT LONG TERM GOAL #2   Title  Pt will improve L shoulder ROM to 165 degrees abduction and 70 degrees IR in order to demonstrate improve functional ROM.    Baseline  L shoulder abduction 153 and IR 60    Time  4    Period  Weeks    Status  New    Target Date  08/17/19      PT LONG TERM GOAL #3   Title  Pt will report 50% improvement in function and pain at home and work in order to demonstrate an improved quality of life.    Baseline  2/10 pain and pain at end range    Time  4    Period  Weeks    Status  New    Target Date  08/17/19            Plan - 08/08/19 1006    Clinical Impression Statement  Pt came in reporting she noticed some minor reduction with fluid in Lt breast after last session so continued with myofascial release to varying areas of Lt breast and into axilla as done at last session. Pt provided feedback during of best stretches felt and so would focus at those areas more. Also continued with manual lymph drainage to decrease any increase in fluid that MFR may have caused. Some softening was noted at Lt lateral upper chest during manual theapy today  and pt reports of feeling the stretches lessened some during session. Encouraged pt that when her compression bra arrives to wear daily, close to 24/7, for at least 2 weeks if she can tolerate  this to further aid in reduction of fluid, because she was only planning on wearing it at night. She verbalized understanding.    Personal Factors and Comorbidities  Comorbidity 1    Comorbidities  radiation    Stability/Clinical Decision Making  Stable/Uncomplicated    Rehab Potential  Excellent    PT Frequency  2x / week    PT Duration  4 weeks    PT Treatment/Interventions  Electrical Stimulation;Iontophoresis 4mg /ml Dexamethasone;Therapeutic activities;Therapeutic exercise;Neuromuscular re-education;Manual techniques    PT Next Visit Plan  myofascial release, ask if better fluid movement with MLD. Cont manual therapy including MLD to Lt breast as time allows. Assess fit of compression bra when this arrives.    PT Home Exercise Plan  Access Code: [email or web address]; Strength ABC program    Consulted and Agree with Plan of Care  Patient       Patient will benefit from skilled therapeutic intervention in order to improve the following deficits and impairments:  Decreased range of motion, Increased edema, Postural dysfunction, Decreased knowledge of precautions  Visit Diagnosis: Malignant neoplasm of upper-outer quadrant of left breast in female, estrogen receptor positive ([place])  Lymphedema, not elsewhere classified  Acute pain of left shoulder  Stiffness of left shoulder, not elsewhere classified     Problem List Patient Active Problem List   Diagnosis Date Noted  . Lichen sclerosus et atrophicus 04/18/2019  . Genetic testing 01/20/2019  . Malignant neoplasm of upper-outer quadrant of left breast in female, estrogen receptor positive ([place]) 01/16/2019  . Achilles tendinosis of left lower extremity 11/30/2016  . Depression, major 01/10/2013  . Generalized anxiety disorder 01/10/2013  . Osteoporosis of  lumbar spine 04/05/2012  . Attention deficit disorder without mention of hyperactivity 04/05/2012  . Tendonitis of wrist, right 02/05/2011    Otelia Limes, PTA 08/08/2019, 11:43 AM  [place], [place], [address] Phone: [phone]   Fax:  [phone]  Name: DARINA MORETA MRN: [id number] Date of Birth: 19-Jun-1965

## 2019-08-10 ENCOUNTER — Other Ambulatory Visit: Payer: Self-pay

## 2019-08-10 ENCOUNTER — Ambulatory Visit: Payer: 59

## 2019-08-10 DIAGNOSIS — M25612 Stiffness of left shoulder, not elsewhere classified: Secondary | ICD-10-CM | POA: Diagnosis not present

## 2019-08-10 DIAGNOSIS — Z170 Estrogen receptor positive status [ER+]: Secondary | ICD-10-CM | POA: Diagnosis not present

## 2019-08-10 DIAGNOSIS — M25512 Pain in left shoulder: Secondary | ICD-10-CM | POA: Diagnosis not present

## 2019-08-10 DIAGNOSIS — I890 Lymphedema, not elsewhere classified: Secondary | ICD-10-CM

## 2019-08-10 DIAGNOSIS — C50412 Malignant neoplasm of upper-outer quadrant of left female breast: Secondary | ICD-10-CM | POA: Diagnosis not present

## 2019-08-10 NOTE — Therapy (Signed)
[place], [place], [address] Phone: [phone]   Fax:  [phone]  Physical Therapy Progress Note  Progress Note Reporting Period 07/13/2019 to 08/10/2019  See note below for Objective Data and Assessment of Progress/Goals.      Patient Details  Name: Stephanie [name] MRN: [id number] Date of Birth: 1965-11-21 Referring Provider (PT): Lurline Del MD   Encounter Date: 08/10/2019  PT End of Session - 08/10/19 0907    Visit Number  9    Number of Visits  11    Date for PT Re-Evaluation  09/14/19    PT Start Time  0905    PT Stop Time  1000    PT Time Calculation (min)  55 min    Activity Tolerance  Patient tolerated treatment well    Behavior During Therapy  [facility] for tasks assessed/performed       Past Medical History:  Diagnosis Date  . Anxiety   . Breast mass, left   . Cancer ([place])   . Complication of anesthesia    felt lethargic x 4 days following last surgery in 2017, but 2018 did okay  . Depression   . Headache    migraines  . History of radiation therapy 03/13/19- 04/11/19   Left Breast 16 fraction of 2. 66 Gy each to total 42.56 Gy. Left breast boost 4 fractions of 2 Gy to total 8 Gy.   . Osteopenia     Past Surgical History:  Procedure Laterality Date  . BREAST BIOPSY Left 07/10/2015   high risk stereo  . BREAST EXCISIONAL BIOPSY Left 08/15/2015   ADH  . BREAST LUMPECTOMY WITH RADIOACTIVE SEED LOCALIZATION Left 08/15/2015   Procedure:  LEFT BREAST LUMPECTOMY WITH RADIOACTIVE SEED LOCALIZATION;  Surgeon: Erroll Luna, MD;  Location: [place];  Service: General;  Laterality: Left;  . BREAST LUMPECTOMY WITH RADIOACTIVE SEED LOCALIZATION Left 02/09/2019   Procedure: LEFT BREAST LUMPECTOMY WITH RADIOACTIVE SEED LOCALIZATION;  Surgeon: [name], MD;  Location: [place];  Service: General;  Laterality: Left;  . EXCISION OF BREAST BIOPSY Left 12/30/2016   Benign ALH sclerosing lesion  . RADIOACTIVE SEED GUIDED EXCISIONAL BREAST BIOPSY Left 12/30/2016   Procedure: RADIOACTIVE SEED GUIDED EXCISIONAL LEFT BREAST BIOPSY;  Surgeon: [name], MD;  Location: [place];  Service: General;  Laterality: Left;  . RADIOACTIVE SEED GUIDED EXCISIONAL BREAST BIOPSY Left 02/09/2019   Procedure: RADIOACTIVE SEED GUIDED EXCISIONAL LEFT  BREAST BIOPSY;  Surgeon: [name], MD;  Location: [place];  Service: General;  Laterality: Left;  . TONSILLECTOMY  Age 54  . TRIGGER FINGER RELEASE  2010   Right Thumb    There were no vitals filed for this visit.  Subjective Assessment - 08/10/19 0907    Subjective  Pt states that she hasn't noticed a big difference. She continues to feel fullness in her L breast that improves during the night and gets worse over the day. She states that the fluctuations are very subtle.    Pertinent History  ductal carcinoma in situ with calcifications, intermediate grade with no lymph node removal and radiation on 04/11/2019    How long can you sit comfortably?  1-2 hours at work before she needs to stretch her arm.    Patient Stated Goals  Maintain and or improve ease of movement and range of motion. ( per patient)    Currently in Pain?  No/denies    Pain Score  0-No pain  [facility] Adult PT Treatment/Exercise - 08/10/19 0001      Manual Therapy   Manual Therapy  Manual Lymphatic Drainage (MLD);Myofascial release    Myofascial Release  Myofascial release across the L breast, cross hand across the incision in the L breast, L lateral chest wall from the axilla to below the L breast, from lateral L bresat to mid-brachium.     Manual Lymphatic Drainage (MLD)  In supine: short neck, swimming in the terminus, Bil shoulder collectors, Bil axillary and L inguinal nodes, L axillo-inguinal anastomosis and anterior inter-axillary anastomosis, L breast superior/inferior toward appropriate  anastomosis, re-worked all surfaces then deep abdominals with extra time spent at the breast prior to myofascial release then re-worked anastomosis after myofascial release and R side-lying to work posterior inter-axillary anastomosis.              PT Education - 08/10/19 1005    Education Details  Pt educated to continue with MLD at home for the L breast and to continue with strength ABC exercises. She will call physical therapy if she has difficulty with her compression bra. She will return in 1 month for assessment. Educated pt that MD office was notified about her seroma. (Dr. Donne Hazel was messaged today)    Person(s) Educated  Patient    Methods  Explanation    Comprehension  Verbalized understanding       PT Short Term Goals - 08/10/19 0911      PT SHORT TERM GOAL #1   Title  Pt will be independent with HEP and self MLD to demonstrate autonomy of care.    Baseline  Pt currently is performing her HEP and MLD daily    Status  Achieved        PT Long Term Goals - 08/10/19 0911      PT LONG TERM GOAL #1   Title  Patient to be properly fitted with compression garment to wear on daily basis.    Baseline  Pt was fitted for a compression bra she has not yet recieved it.    Time  4    Period  Weeks    Status  Partially Met    Target Date  09/14/19      PT LONG TERM GOAL #2   Title  Pt will improve L shoulder ROM to 165 degrees abduction and 70 degrees IR in order to demonstrate improve functional ROM.    Baseline  165 L shoulder flexion, L shoulder internal rotation: 85 degrees    Status  Achieved      PT LONG TERM GOAL #3   Title  Pt will report 50% improvement in function and pain at home and work in order to demonstrate an improved quality of life.    Baseline  Pt reports 0/10 pain and 70% improvement since beginning physical therapy    Status  Achieved            Plan - 08/10/19 0906    Clinical Impression Statement  Pt presents to physical therapy with  continued edema in her L breast. She continues with seroma in the L breast that is not decreasing in size. She is not seeing good results with MLD just very mild relief from swelling; discussed this with patient and MD was notified for possible drainage of seroma. Pt goals were assessed today and she has met her ROM goals for her shoulder and is reporting 70% improvement in pain/function since beginning physical therapy. She currently has not yet  recieved her compression garment. Pt will benefit from skilled physical therapy services 1x EOW for 4 weeks in order to ensure that she has received adequate compression and care for the edema in the L breast.    Personal Factors and Comorbidities  Comorbidity 1    Comorbidities  radiation    PT Frequency  Biweekly    PT Duration  4 weeks    PT Treatment/Interventions  Electrical Stimulation;Iontophoresis 54m/ml Dexamethasone;Therapeutic activities;Therapeutic exercise;Neuromuscular re-education;Manual techniques    PT Next Visit Plan  ask about compression bra, MLD?, heard from dr. WDonne Hazel    PT Home Exercise Plan  Access Code: [email or web address]; Strength ABC program    Consulted and Agree with Plan of Care  Patient       Patient will benefit from skilled therapeutic intervention in order to improve the following deficits and impairments:  Decreased range of motion, Increased edema, Postural dysfunction, Decreased knowledge of precautions  Visit Diagnosis: Malignant neoplasm of upper-outer quadrant of left breast in [place], [place]  Lymphedema, not elsewhere classified  Acute pain of left shoulder  Stiffness of left shoulder, not elsewhere classified     Problem List Patient Active Problem List   Diagnosis Date Noted  . Lichen sclerosus et atrophicus 04/18/2019  . Genetic testing 01/20/2019  . Malignant ne[place] of upper-outer quadrant of [place] in [place], [place] 01/16/2019  . Achilles  tendinosis of left lower extremity 11/30/2016  . Depression, major 01/10/2013  . Generalized anxiety disorder 01/10/2013  . Osteoporosis of lumbar spine 04/05/2012  . Attention deficit disorder without mention of hyperactivity 04/05/2012  . Tendonitis of wrist, right 02/05/2011    CGretel AcreHealy,PT 08/10/2019, 10:19 AM  [place] [address]Phone: [phone]  Fax:  [phone] Name: Stephanie HAZELETTMRN: [id number] of Birth: [birth year]

## 2019-08-14 ENCOUNTER — Other Ambulatory Visit: Payer: Self-pay | Admitting: General Surgery

## 2019-08-14 DIAGNOSIS — N6489 Other specified disorders of breast: Secondary | ICD-10-CM

## 2019-08-15 ENCOUNTER — Other Ambulatory Visit: Payer: 59

## 2019-08-15 ENCOUNTER — Other Ambulatory Visit: Payer: Self-pay | Admitting: General Surgery

## 2019-08-15 DIAGNOSIS — N6489 Other specified disorders of breast: Secondary | ICD-10-CM

## 2019-08-16 ENCOUNTER — Ambulatory Visit
Admission: RE | Admit: 2019-08-16 | Discharge: 2019-08-16 | Disposition: A | Discharge status: Home / Self Care | Payer: 59 | Source: Ambulatory Visit | Attending: General Surgery | Admitting: General Surgery

## 2019-08-16 ENCOUNTER — Other Ambulatory Visit: Payer: Self-pay

## 2019-08-16 DIAGNOSIS — N6489 Other specified disorders of breast: Secondary | ICD-10-CM

## 2019-08-16 DIAGNOSIS — R922 Inconclusive mammogram: Secondary | ICD-10-CM | POA: Diagnosis not present

## 2019-08-18 DIAGNOSIS — D0512 Intraductal carcinoma in situ of left breast: Secondary | ICD-10-CM | POA: Diagnosis not present

## 2019-08-21 ENCOUNTER — Other Ambulatory Visit: Payer: Self-pay | Admitting: Family Medicine

## 2019-08-21 DIAGNOSIS — M810 Age-related osteoporosis without current pathological fracture: Secondary | ICD-10-CM

## 2019-09-05 ENCOUNTER — Other Ambulatory Visit: Payer: 59

## 2019-09-06 ENCOUNTER — Encounter: Payer: Self-pay | Admitting: Oncology

## 2019-09-06 ENCOUNTER — Telehealth: Payer: Self-pay | Admitting: *Deleted

## 2019-09-06 NOTE — Telephone Encounter (Signed)
This RN spoke with pt per her my chart message regarding ongoing leg cramps for further information.  [name] states she uses the gabapentin at night before bed - and the leg cramps no longer wake her up.  She notices the leg cramps during the day " usually when I am exercising and then sometimes in the evening when I am relaxing watching tv "  She states cramps are bilateral - in calves ( not at the same time ) " can feel as tight as a tennis ball" As well as in feet/toes at times. Cramps are intermittent.   This RN discussed above and recommended use of magnesium supplement and or tonic water for benefit.  She will let us know if cramps continue for further recommendations or work up.

## 2019-09-12 ENCOUNTER — Ambulatory Visit: Payer: 59 | Attending: Oncology

## 2019-09-12 ENCOUNTER — Other Ambulatory Visit: Payer: Self-pay

## 2019-09-12 DIAGNOSIS — Z170 Estrogen receptor positive status [ER+]: Secondary | ICD-10-CM | POA: Diagnosis not present

## 2019-09-12 DIAGNOSIS — M25512 Pain in left shoulder: Secondary | ICD-10-CM | POA: Diagnosis not present

## 2019-09-12 DIAGNOSIS — C50412 Malignant neoplasm of upper-outer quadrant of left female breast: Secondary | ICD-10-CM | POA: Diagnosis not present

## 2019-09-12 DIAGNOSIS — M25612 Stiffness of left shoulder, not elsewhere classified: Secondary | ICD-10-CM | POA: Insufficient documentation

## 2019-09-12 DIAGNOSIS — I890 Lymphedema, not elsewhere classified: Secondary | ICD-10-CM | POA: Diagnosis not present

## 2019-09-12 NOTE — Therapy (Signed)
[place], [place], [address] Phone: [phone]   Fax:  [phone]  Physical Therapy Discharge Note  Patient Details  Name: Stephanie [name] MRN: [id number] Date of Birth: 1965/11/30 Referring Provider (PT): Lurline Del MD   Encounter Date: 09/12/2019  PT End of Session - 09/12/19 0814    Visit Number  10    Number of Visits  11    Date for PT Re-Evaluation  09/14/19    PT Start Time  0809    PT Stop Time  0902    PT Time Calculation (min)  53 min    Activity Tolerance  Patient tolerated treatment well    Behavior During Therapy  [facility] for tasks assessed/performed       Past Medical History:  Diagnosis Date  . Anxiety   . Breast mass, left   . Cancer ([place])   . Complication of anesthesia    felt lethargic x 4 days following last surgery in 2017, but 2018 did okay  . Depression   . Headache    migraines  . History of radiation therapy 03/13/19- 04/11/19   Left Breast 16 fraction of 2. 66 Gy each to total 42.56 Gy. Left breast boost 4 fractions of 2 Gy to total 8 Gy.   . Osteopenia     Past Surgical History:  Procedure Laterality Date  . BREAST BIOPSY Left 07/10/2015   high risk stereo  . BREAST EXCISIONAL BIOPSY Left 08/15/2015   ADH  . BREAST LUMPECTOMY WITH RADIOACTIVE SEED LOCALIZATION Left 08/15/2015   Procedure:  LEFT BREAST LUMPECTOMY WITH RADIOACTIVE SEED LOCALIZATION;  Surgeon: Erroll Luna, MD;  Location: [facility];  Service: General;  Laterality: Left;  . BREAST LUMPECTOMY WITH RADIOACTIVE SEED LOCALIZATION Left 02/09/2019   Procedure: LEFT BREAST LUMPECTOMY WITH RADIOACTIVE SEED LOCALIZATION;  Surgeon: [name], MD;  Location: [facility];  Service: General;  Laterality: Left;  . EXCISION OF BREAST BIOPSY Left 12/30/2016   Benign ALH sclerosing lesion  . RADIOACTIVE SEED GUIDED EXCISIONAL BREAST BIOPSY Left 12/30/2016   Procedure: RADIOACTIVE SEED GUIDED  EXCISIONAL LEFT BREAST BIOPSY;  Surgeon: [name], MD;  Location: [facility];  Service: General;  Laterality: Left;  . RADIOACTIVE SEED GUIDED EXCISIONAL BREAST BIOPSY Left 02/09/2019   Procedure: RADIOACTIVE SEED GUIDED EXCISIONAL LEFT  BREAST BIOPSY;  Surgeon: [name], MD;  Location: [facility];  Service: General;  Laterality: Left;  . TONSILLECTOMY  Age 54  . TRIGGER FINGER RELEASE  2010   Right Thumb    There were no vitals filed for this visit.  Subjective Assessment - 09/12/19 0815    Subjective  Pt states that she is starting to do restorative yoga on suggestion by Dr. Donne Hazel and can notice a huge difference in her stiffness. She states that she had no seroma with imaging and still has not recieved her compression bra.    Pertinent History  ductal carcinoma in situ with calcifications, intermediate grade with no lymph node removal and radiation on 04/11/2019    How long can you sit comfortably?  1-2 hours at work before she needs to stretch her arm.    Patient Stated Goals  Maintain and or improve ease of movement and range of motion. ( per patient)    Currently in Pain?  No/denies    Pain Score  0-No pain  [facility] Adult PT Treatment/Exercise - 09/12/19 0001      Manual Therapy   Manual Therapy  Manual Lymphatic Drainage (MLD);Edema management    Edema Management  discussed compression garments with patient including compression cami vs. compression bra. Pt was measured for both.     Manual Lymphatic Drainage (MLD)  In supine: short neck, swimming in the terminus, Bil shoulder collectors, Bil axillary and L inguinal nodes, L axillo-inguinal anastomosis and anterior inter-axillary anastomosis, L breast superior/inferior toward appropriate anastomosis, re-worked all surfaces then deep abdominals with extra time spent at the breast              PT Education - 09/12/19 0901    Education Details  Extra time  spent today discussing different types of compression for the breast and trunk. Pt has tried a compression bra but didn't like it due to it rolled up under her breast. DIscussed trying a compression cami to help facilitate fluid flow out of this area as well as re-iterated education that time will help with inflammation and swelling. Pt will continue to perform MLD at home discussed performing with light sweeping vs. deeper pull to create a suction effect in the lymphatic system.    Person(s) Educated  Patient    Methods  Explanation    Comprehension  Verbalized understanding       PT Short Term Goals - 08/10/19 0911      PT SHORT TERM GOAL #1   Title  Pt will be independent with HEP and self MLD to demonstrate autonomy of care.    Baseline  Pt currently is performing her HEP and MLD daily    Status  Achieved        PT Long Term Goals - 08/10/19 0911      PT LONG TERM GOAL #1   Title  Patient to be properly fitted with compression garment to wear on daily basis.    Baseline  Pt was fitted for a compression bra she has not yet recieved it.    Time  4    Period  Weeks    Status  Partially Met    Target Date  09/14/19      PT LONG TERM GOAL #2   Title  Pt will improve L shoulder ROM to 165 degrees abduction and 70 degrees IR in order to demonstrate improve functional ROM.    Baseline  165 L shoulder flexion, L shoulder internal rotation: 85 degrees    Status  Achieved      PT LONG TERM GOAL #3   Title  Pt will report 50% improvement in function and pain at home and work in order to demonstrate an improved quality of life.    Baseline  Pt reports 0/10 pain and 70% improvement since beginning physical therapy    Status  Achieved            Plan - 09/12/19 0813    Clinical Impression Statement  Pt presents to physical therapy with continued edema in her L lateral breast. Pt has met most of her goals and has been participating in restorative yoga that she reports is helping her  with stiffness and mobility. She has been imaged and no seroma was found but continues to only see mild results with MLD; though she does see better results with deeper massage and skin stretch. Pt was measured for a compression cami today due to the compression bra was rolling when she would sit in her car due  to pt will benefit from some compression as she continues to heal from radiation and surgery. Pt will be discharged today and will notify MD or call physical therapy clinic if she needs to return.    Personal Factors and Comorbidities  Comorbidity 1    Comorbidities  radiation    Rehab Potential  Excellent    PT Frequency  Biweekly    PT Duration  4 weeks    PT Treatment/Interventions  Electrical Stimulation;Iontophoresis 54m/ml Dexamethasone;Therapeutic activities;Therapeutic exercise;Neuromuscular re-education;Manual techniques    PT Next Visit Plan  Pt will be discharged today.    PT Home Exercise Plan  Access Code: [email or web address]; Strength ABC program    Recommended Other Services  slimmer by wearease Size medium [employer]    Consulted and Agree with Plan of Care  Patient       Patient will benefit from skilled therapeutic intervention in order to improve the following deficits and impairments:  Decreased range of motion, Increased edema, Postural dysfunction, Decreased knowledge of precautions  Visit Diagnosis: Malignant neoplasm of upper-outer quadrant of left breast in female, estrogen receptor positive ([place]  Lymphedema, not elsewhere classified  Acute pain of left shoulder  Stiffness of left shoulder, not elsewhere classified     Problem List Patient Active Problem List   Diagnosis Date Noted  . Lichen sclerosus et atrophicus 04/18/2019  . Genetic testing 01/20/2019  . Malignant neoplasm of upper-outer quadrant of left breast in female, estrogen receptor positive ([place] 01/16/2019  . Achilles tendinosis of left lower extremity 11/30/2016  . Depression, major 01/10/2013  .  Generalized anxiety disorder 01/10/2013  . Osteoporosis of lumbar spine 04/05/2012  . Attention deficit disorder without mention of hyperactivity 04/05/2012  . Tendonitis of wrist, right 02/05/2011    CAnder Purpura PT 09/12/2019, 9:58 AM  [place] [address]Phone: [phone]  Fax:  [phone] Name: Stephanie GIEBELMRN: [id number] of Birth: 808/04/1965

## 2019-09-25 MED FILL — GABAPENTIN 300 MG CAPSULE: 300 | 90 days supply | Qty: 90 | Fill #1

## 2019-09-26 MED FILL — SERTRALINE HCL 100 MG TAB: 100 | 90 days supply | Qty: 90 | Fill #1

## 2019-10-02 ENCOUNTER — Encounter: Payer: Self-pay | Admitting: Oncology

## 2019-10-02 ENCOUNTER — Other Ambulatory Visit: Payer: Self-pay

## 2019-10-02 ENCOUNTER — Ambulatory Visit
Admission: RE | Admit: 2019-10-02 | Discharge: 2019-10-02 | Disposition: A | Discharge status: Home / Self Care | Payer: 59 | Source: Ambulatory Visit | Attending: Oncology | Admitting: Oncology

## 2019-10-02 DIAGNOSIS — C50412 Malignant neoplasm of upper-outer quadrant of left female breast: Secondary | ICD-10-CM

## 2019-10-02 DIAGNOSIS — Z170 Estrogen receptor positive status [ER+]: Secondary | ICD-10-CM

## 2019-10-02 DIAGNOSIS — M810 Age-related osteoporosis without current pathological fracture: Secondary | ICD-10-CM

## 2019-10-09 ENCOUNTER — Encounter: Payer: Self-pay | Admitting: Oncology

## 2019-10-09 ENCOUNTER — Inpatient Hospital Stay: Payer: 59 | Admitting: Oncology

## 2019-10-16 ENCOUNTER — Other Ambulatory Visit: Payer: Self-pay | Admitting: General Surgery

## 2019-10-16 DIAGNOSIS — Z1231 Encounter for screening mammogram for malignant neoplasm of breast: Secondary | ICD-10-CM

## 2019-10-18 ENCOUNTER — Other Ambulatory Visit: Payer: 59

## 2019-10-24 ENCOUNTER — Other Ambulatory Visit: Payer: Self-pay | Admitting: Family Medicine

## 2019-10-24 ENCOUNTER — Other Ambulatory Visit: Payer: Self-pay

## 2019-10-24 ENCOUNTER — Encounter: Payer: Self-pay | Admitting: Family Medicine

## 2019-10-24 ENCOUNTER — Ambulatory Visit (INDEPENDENT_AMBULATORY_CARE_PROVIDER_SITE_OTHER): Payer: 59 | Admitting: Family Medicine

## 2019-10-24 VITALS — BP 114/75 | HR 72 | Wt 149.0 lb

## 2019-10-24 DIAGNOSIS — M810 Age-related osteoporosis without current pathological fracture: Secondary | ICD-10-CM

## 2019-10-24 DIAGNOSIS — Z0000 Encounter for general adult medical examination without abnormal findings: Secondary | ICD-10-CM | POA: Diagnosis not present

## 2019-10-24 MED ORDER — ALENDRONATE SODIUM 70 MG PO TABS: ORAL_TABLET | ORAL | 3 refills | Status: DC

## 2019-10-24 MED FILL — ALENDRONATE NA 70 MG TAB: 70 | 84 days supply | Qty: 12 | Fill #0

## 2019-10-24 NOTE — Progress Notes (Signed)
°  Subjective:     Stephanie [name] is a 54 y.o. female and is here for a comprehensive physical exam. The patient reports no problems.   The following portions of the patient's history were reviewed and updated as appropriate: allergies, current medications, past family history, past medical history, past social history, past surgical history and problem list.  Review of Systems Pertinent items noted in HPI and remainder of comprehensive ROS otherwise negative.   Objective:    BP 114/75    Pulse 72    Wt 149 lb (67.6 kg)    LMP 01/25/2014 (Approximate)    BMI 26.39 kg/m  General appearance: alert, cooperative and appears stated age Head: Normocephalic, without obvious abnormality, atraumatic Neck: supple, symmetrical, trachea midline Lungs: normal effort Heart: regular rate and rhythm Abdomen: soft, non-tender; bowel sounds normal; no masses,  no organomegaly Extremities: extremities normal, atraumatic, no cyanosis or edema Skin: Skin color, texture, turgor normal. No rashes or lesions Neurologic: Grossly normal    Assessment:    Healthy female exam.      Plan:  Adult general medical exam - Annual labs today, pap is up to date, mammogram + Dexa in 9/21. - Plan: CBC, TSH, Hemoglobin A1c, Comprehensive metabolic panel, Lipid panel, VITAMIN D 25 Hydroxy (Vit-D Deficiency, Fractures)  Osteoporosis of lumbar spine - Plan: alendronate (FOSAMAX) 70 MG tablet  Return in 1 year (on 10/23/2020).    See After Visit Summary for Counseling Recommendations

## 2019-10-24 NOTE — Patient Instructions (Signed)

## 2019-10-25 LAB — CBC
Hematocrit: 39.8 % (ref 34.0–46.6)
Hemoglobin: 13.4 g/dL (ref 11.1–15.9)
MCH: 30.4 pg (ref 26.6–33.0)
MCHC: 33.7 g/dL (ref 31.5–35.7)
MCV: 90 fL (ref 79–97)
Platelets: 332 10*3/uL (ref 150–450)
RBC: 4.41 x10E6/uL (ref 3.77–5.28)
RDW: 11.8 % (ref 11.7–15.4)
WBC: 6.1 10*3/uL (ref 3.4–10.8)

## 2019-10-25 LAB — COMPREHENSIVE METABOLIC PANEL
ALT: 13 IU/L (ref 0–32)
AST: 29 IU/L (ref 0–40)
Albumin/Globulin Ratio: 1.8 (ref 1.2–2.2)
Albumin: 4.6 g/dL (ref 3.8–4.9)
Alkaline Phosphatase: 81 IU/L (ref 48–121)
BUN/Creatinine Ratio: 16 (ref 9–23)
BUN: 11 mg/dL (ref 6–24)
Bilirubin Total: <0.2 mg/dL (ref 0.0–1.2)
CO2: 27 mmol/L (ref 20–29)
Calcium: 9.7 mg/dL (ref 8.7–10.2)
Chloride: 101 mmol/L (ref 96–106)
Creatinine, Ser: 0.69 mg/dL (ref 0.57–1.00)
GFR calc Af Amer: 115 mL/min/{1.73_m2} (ref >59)
GFR calc non Af Amer: 100 mL/min/{1.73_m2} (ref >59)
Globulin, Total: 2.5 g/dL (ref 1.5–4.5)
Glucose: 75 mg/dL (ref 65–99)
Potassium: 4.3 mmol/L (ref 3.5–5.2)
Sodium: 142 mmol/L (ref 134–144)
Total Protein: 7.1 g/dL (ref 6.0–8.5)

## 2019-10-25 LAB — LIPID PANEL
Chol/HDL Ratio: 3.8 ratio (ref 0.0–4.4)
Cholesterol, Total: 252 mg/dL — ABNORMAL HIGH (ref 100–199)
HDL: 66 mg/dL (ref >39)
LDL Chol Calc (NIH): 144 mg/dL — ABNORMAL HIGH (ref 0–99)
Triglycerides: 235 mg/dL — ABNORMAL HIGH (ref 0–149)
VLDL Cholesterol Cal: 42 mg/dL — ABNORMAL HIGH (ref 5–40)

## 2019-10-25 LAB — HEMOGLOBIN A1C
Est. average glucose Bld gHb Est-mCnc: 105 mg/dL
Hgb A1c MFr Bld: 5.3 % (ref 4.8–5.6)

## 2019-10-25 LAB — VITAMIN D 25 HYDROXY (VIT D DEFICIENCY, FRACTURES): Vit D, 25-Hydroxy: 29.6 ng/mL — ABNORMAL LOW (ref 30.0–100.0)

## 2019-10-25 LAB — TSH: TSH: 2.88 u[IU]/mL (ref 0.450–4.500)

## 2019-10-26 MED FILL — ESTRING 2 MG VAGINAL RING: 2 | 90 days supply | Qty: 1 | Fill #2

## 2019-10-26 MED FILL — TAMOXIFEN 20 MG TABLET: 20 | 90 days supply | Qty: 90 | Fill #2

## 2019-10-31 ENCOUNTER — Encounter: Payer: Self-pay | Admitting: Oncology

## 2019-11-03 ENCOUNTER — Encounter: Payer: Self-pay | Admitting: Oncology

## 2019-11-03 ENCOUNTER — Other Ambulatory Visit: Payer: Self-pay | Admitting: *Deleted

## 2019-11-03 MED ORDER — SERTRALINE HCL 50 MG PO TABS: 100.0000 mg | ORAL_TABLET | Freq: Every day | ORAL | 1 refills | Status: DC

## 2019-11-03 MED FILL — SERTRALINE HCL 50 MG TABS: 50 | 45 days supply | Qty: 90 | Fill #0

## 2019-12-04 ENCOUNTER — Other Ambulatory Visit: Payer: Self-pay | Admitting: Family Medicine

## 2019-12-04 ENCOUNTER — Other Ambulatory Visit: Payer: Self-pay

## 2019-12-04 ENCOUNTER — Encounter: Payer: Self-pay | Admitting: Family Medicine

## 2019-12-04 ENCOUNTER — Ambulatory Visit (INDEPENDENT_AMBULATORY_CARE_PROVIDER_SITE_OTHER): Payer: 59 | Admitting: Family Medicine

## 2019-12-04 VITALS — BP 120/76 | HR 88 | Wt 149.0 lb

## 2019-12-04 DIAGNOSIS — N950 Postmenopausal bleeding: Secondary | ICD-10-CM | POA: Diagnosis not present

## 2019-12-04 MED ORDER — SCOPOLAMINE 1 MG/3DAYS TD PT72: 1.0000 | MEDICATED_PATCH | TRANSDERMAL | 2 refills | Status: DC

## 2019-12-04 MED FILL — SCOPOLAMINE 1 MG/3DAYS PT72: 1 | 12 days supply | Qty: 4 | Fill #0

## 2019-12-04 NOTE — Progress Notes (Signed)
   Subjective:    Patient ID: Stephanie [name] is a 54 y.o. female presenting with Biopsy  on 12/04/2019  HPI: On Tamoxifen. Working on weaning her zoloft. Also on Estring. Now with some new PMB x 2 weeks. Just spotting.  Review of Systems  Constitutional: Negative for chills and fever.  Respiratory: Negative for shortness of breath.   Cardiovascular: Negative for chest pain.  Gastrointestinal: Negative for abdominal pain, nausea and vomiting.  Genitourinary: Negative for dysuria.  Skin: Negative for rash.      Objective:    BP 120/76   Pulse 88   Wt 149 lb (67.6 kg)   LMP 01/25/2014 (Approximate)   BMI 26.39 kg/m  Physical Exam Constitutional:      General: She is not in acute distress.    Appearance: She is well-developed.  HENT:     Head: Normocephalic and atraumatic.  Eyes:     General: No scleral icterus. Cardiovascular:     Rate and Rhythm: Normal rate.  Pulmonary:     Effort: Pulmonary effort is normal.  Abdominal:     Palpations: Abdomen is soft.  Genitourinary:    Comments: BUS normal, vagina is atrophic, cervix is nulliparous without lesion, uterus is small and anteverted  Musculoskeletal:     Cervical back: Neck supple.  Skin:    General: Skin is warm and dry.  Neurological:     Mental Status: She is alert and oriented to person, place, and time.    Procedure: Patient given informed consent, signed copy in the chart, time out was performed. Appropriate time out taken. The patient was placed in the lithotomy position and the cervix brought into view with sterile speculum.  Portio of cervix cleansed x 2 with betadine swabs.  A tenaculum was placed in the anterior lip of the cervix.  The uterus was sounded for depth of 7 cm. A pipelle was introduced to into the uterus, suction created,  and an endometrial sample was obtained. All equipment was removed and accounted for.  The patient tolerated the procedure well.      Assessment & Plan:   Problem List  Items Addressed This Visit      Unprioritized   Postmenopausal bleeding - Primary    Likely related to tamoxifen plus Estring use.  Will check pelvic sonogram.  Status post endometrial biopsy today.  Await pathology results.  Discussed options including potential endometrial ablation if bleeding is persistent.      Relevant Orders   Surgical pathology( [facility]/ [facility])   US PELVIC COMPLETE WITH TRANSVAGINAL      Total time in review of prior notes, pathology, labs, history taking, review with patient, exam, note writing, discussion of options, plan for next steps, alternatives and risks of treatment: 20 minutes.  Return if symptoms worsen or fail to improve, for needs U/S.  Donnamae Jude 12/04/2019 5:09 PM

## 2019-12-04 NOTE — Patient Instructions (Signed)
Postmenopausal Bleeding  Postmenopausal bleeding is any bleeding that occurs after menopause. Menopause is when a woman's period stops. Any type of bleeding after menopause should be checked by your doctor. Treatment will depend on the cause. Follow these instructions at home:  Pay attention to any changes in your symptoms.  Avoid using tampons and douches as told by your doctor.  Change your pads regularly.  Get regular pelvic exams and Pap tests.  Take iron pills as told by your doctor.  Take over-the-counter and prescription medicines only as told by your doctor.  Keep all follow-up visits as told by your doctor. This is important. Contact a doctor if:  Your bleeding lasts for more than 1 week.  You have pain in your belly (abdomen).  You have bleeding during or after sex.  You have bleeding that happens more often than every 3 weeks. Get help right away if:  You have fever, chills, headache, dizziness, muscle aches, or bleeding.  You have very bad pain with bleeding.  You have clumps of blood (blood clots) coming from your vagina.  You have a lot of bleeding, and: ? You use more than 1 pad an hour. ? This kind of bleeding has never happened before.  You feel like you are going to pass out (faint). Summary  Any type of bleeding after menopause should be checked by your doctor.  Pay attention to any changes in your symptoms.  Keep all follow-up visits as told by your doctor. This information is not intended to replace advice given to you by your health care provider. Make sure you discuss any questions you have with your health care provider. Document Revised: 06/30/2018 Document Reviewed: 05/19/2016 Elsevier Patient Education  2020 Elsevier Inc.  

## 2019-12-04 NOTE — Progress Notes (Unsigned)
Needs scopolamine for motion sickness.

## 2019-12-04 NOTE — Assessment & Plan Note (Signed)
Likely related to tamoxifen plus Estring use.  Will check pelvic sonogram.  Status post endometrial biopsy today.  Await pathology results.  Discussed options including potential endometrial ablation if bleeding is persistent.

## 2019-12-06 MED FILL — SERTRALINE HCL 50 MG TABLET: 50 | 45 days supply | Qty: 90 | Fill #0

## 2019-12-21 ENCOUNTER — Telehealth: Payer: 59 | Admitting: Emergency Medicine

## 2019-12-21 DIAGNOSIS — W57XXXA Bitten or stung by nonvenomous insect and other nonvenomous arthropods, initial encounter: Secondary | ICD-10-CM

## 2019-12-21 DIAGNOSIS — R21 Rash and other nonspecific skin eruption: Secondary | ICD-10-CM | POA: Diagnosis not present

## 2019-12-21 MED ORDER — PREDNISONE 20 MG PO TABS: 40.0000 mg | ORAL_TABLET | Freq: Every day | ORAL | 0 refills | Status: DC

## 2019-12-21 MED FILL — predniSONE 20 MG TABS: 20 | 5 days supply | Qty: 10 | Fill #0

## 2019-12-21 NOTE — Progress Notes (Signed)
E Visit for Insect Sting  Thank you for describing the insect sting for [place].  Here is how we plan to help!  A sting that we will treat with a short course of prednisone.  The 2 greatest risks from insect stings are allergic reaction, which can be fatal in some people and infection, which is more common and less serious.  Bees, wasps, yellow jackets, and hornets belong to a class of insects called Hymenoptera.  Most insect stings cause only minor discomfort.  Stings can happen anywhere on the body and can be painful.  Most stings are from honey bees or yellow jackets.  Fire ants can sting multiple times.  The sites of the stings are more likely to become infected.    I have sent in prednisone 40 mg by mouth daily for 5 days to the pharmacy you selected.  Please make sure that you selected a pharmacy that is open now.  What can be used to prevent Insect Stings?   Insect repellant with at least 20% DEET.    Wearing long pants and shirts with socks and shoes.    Wear dark or drab-colored clothes rather than bright colors.    Avoid using perfumes and hair sprays; these attract insects.  HOME CARE ADVICE:  1. Stinger removal:  The stinger looks like a tiny black dot in the sting.  Use a fingernail, credit card edge, or knife-edge to scrape it off.  Don't pull it out because it squeezes out more venom.  If the stinger is below the skin surface, leave it alone.  It will be shed with normal skin healing. 2. Use cold compresses to the area of the sting for 10-20 minutes.  You may repeat this as needed to relieve symptoms of pain and swelling. 3.  For pain relief, take acetominophen 650 mg 4-6 hours as needed or ibuprofen 400 mg every 6-8 hours as needed or naproxen 250-500 mg every 12 hours as needed. 4.  You can also use hydrocortisone cream 0.5% or 1% up to 4 times daily as needed for itching. 5.  If the sting becomes very itchy, take Benadryl 25-50 mg, follow directions on box. 6.   Wash the area 2-3 times daily with antibacterial soap and warm water. 7. Call your Doctor if:  Fever, a severe headache, or rash occur in the next 2 weeks.  Sting area begins to look infected.  Redness and swelling worsens after home treatment.  Your current symptoms become worse.    MAKE SURE YOU:   Understand these instructions.  Will watch your condition.  Will get help right away if you are not doing well or get worse.  Thank you for choosing an e-visit. Your e-visit answers were reviewed by a board certified advanced clinical practitioner to complete your personal care plan. Depending upon the condition, your plan could have included both over the counter or prescription medications. Please review your pharmacy choice. Be sure that the pharmacy you have chosen is open so that you can pick up your prescription now.  If there is a problem you may message your provider in [place] to have the prescription routed to another pharmacy. Your safety is important to [place]. If you have drug allergies check your prescription carefully.  For the next 24 hours, you can use MyChart to ask questions about today's visit, request a non-urgent call back, or ask for a work or school excuse from your e-visit provider. You will get an email in the  next two days asking about your experience. I hope that your e-visit has been valuable and will speed your recovery.  **Please do not respond to this message unless you have follow up questions.** Greater than 5 but less than 10 minutes spent researching, coordinating, and implementing care for this patient today

## 2019-12-30 MED FILL — GABAPENTIN 300 MG CAPSULE: 300 | 90 days supply | Qty: 90 | Fill #2

## 2020-01-02 ENCOUNTER — Other Ambulatory Visit: Payer: 59 | Admitting: Family Medicine

## 2020-01-03 ENCOUNTER — Encounter: Payer: Self-pay | Admitting: Oncology

## 2020-01-03 ENCOUNTER — Other Ambulatory Visit: Payer: Self-pay | Admitting: General Surgery

## 2020-01-03 DIAGNOSIS — Z853 Personal history of malignant neoplasm of breast: Secondary | ICD-10-CM

## 2020-01-04 NOTE — Telephone Encounter (Signed)
This RN called pt per issue-  She states she was in [place] about a month ago and " got bite by multiple and I mean totally ate up with deer flies "  She states noted improvement in bruising of left breast which is in location of surgery and radiation.  She denies any tenderness or redness nor is breast warm to touch.  Of note she was put on 4 days of steroids at 40 mg a day by primary MD.  She states she did scratch the area as well prior to getting the steroid.  This RN informed pt above likely related to impaired site and use of steroids and scratching.  She will use cool compresses and monitor for any further issues.  Of note she is scheduled for mammogram and bone density tomorrow and needs an appointment with MD for follow up.  This RN informed pt best to proceed with the bone density but to reschedule the mammogram for at least 2 weeks to allow further healing of a compromised area to not cause further injury.  Appointment for MD will be requesting for October 2021.  [name] stated understanding of above plan.

## 2020-01-05 ENCOUNTER — Ambulatory Visit
Admission: RE | Admit: 2020-01-05 | Discharge: 2020-01-05 | Disposition: A | Discharge status: Home / Self Care | Payer: 59 | Source: Ambulatory Visit | Attending: Family Medicine | Admitting: Family Medicine

## 2020-01-05 ENCOUNTER — Other Ambulatory Visit: Payer: Self-pay

## 2020-01-05 DIAGNOSIS — M810 Age-related osteoporosis without current pathological fracture: Secondary | ICD-10-CM

## 2020-01-05 DIAGNOSIS — M8588 Other specified disorders of bone density and structure, other site: Secondary | ICD-10-CM | POA: Diagnosis not present

## 2020-01-05 DIAGNOSIS — Z780 Asymptomatic menopausal state: Secondary | ICD-10-CM | POA: Diagnosis not present

## 2020-01-19 ENCOUNTER — Encounter: Payer: Self-pay | Admitting: Oncology

## 2020-01-19 ENCOUNTER — Ambulatory Visit
Admission: RE | Admit: 2020-01-19 | Discharge: 2020-01-19 | Disposition: A | Discharge status: Home / Self Care | Payer: 59 | Source: Ambulatory Visit | Attending: General Surgery | Admitting: General Surgery

## 2020-01-19 ENCOUNTER — Other Ambulatory Visit: Payer: Self-pay

## 2020-01-19 ENCOUNTER — Telehealth: Payer: Self-pay | Admitting: Adult Health

## 2020-01-19 DIAGNOSIS — Z853 Personal history of malignant neoplasm of breast: Secondary | ICD-10-CM

## 2020-01-19 DIAGNOSIS — R922 Inconclusive mammogram: Secondary | ICD-10-CM | POA: Diagnosis not present

## 2020-01-19 NOTE — Telephone Encounter (Signed)
Scheduled apt per 9/24 sch msg - left message with appt date and time

## 2020-01-23 ENCOUNTER — Other Ambulatory Visit: Payer: Self-pay

## 2020-01-23 DIAGNOSIS — C50412 Malignant neoplasm of upper-outer quadrant of left female breast: Secondary | ICD-10-CM

## 2020-01-24 ENCOUNTER — Encounter: Payer: Self-pay | Admitting: Adult Health

## 2020-01-24 ENCOUNTER — Other Ambulatory Visit: Payer: Self-pay

## 2020-01-24 ENCOUNTER — Inpatient Hospital Stay: Payer: 59 | Attending: Adult Health | Admitting: Adult Health

## 2020-01-24 ENCOUNTER — Inpatient Hospital Stay: Payer: 59

## 2020-01-24 VITALS — BP 114/59 | HR 59 | Temp 98.0°F | Resp 17 | Ht 63.0 in | Wt 148.6 lb

## 2020-01-24 DIAGNOSIS — Z7952 Long term (current) use of systemic steroids: Secondary | ICD-10-CM | POA: Insufficient documentation

## 2020-01-24 DIAGNOSIS — D0512 Intraductal carcinoma in situ of left breast: Secondary | ICD-10-CM | POA: Diagnosis not present

## 2020-01-24 DIAGNOSIS — C50412 Malignant neoplasm of upper-outer quadrant of left female breast: Secondary | ICD-10-CM | POA: Diagnosis not present

## 2020-01-24 DIAGNOSIS — Z8249 Family history of ischemic heart disease and other diseases of the circulatory system: Secondary | ICD-10-CM | POA: Diagnosis not present

## 2020-01-24 DIAGNOSIS — Z833 Family history of diabetes mellitus: Secondary | ICD-10-CM | POA: Diagnosis not present

## 2020-01-24 DIAGNOSIS — Z923 Personal history of irradiation: Secondary | ICD-10-CM | POA: Insufficient documentation

## 2020-01-24 DIAGNOSIS — Z79899 Other long term (current) drug therapy: Secondary | ICD-10-CM | POA: Insufficient documentation

## 2020-01-24 DIAGNOSIS — F329 Major depressive disorder, single episode, unspecified: Secondary | ICD-10-CM | POA: Insufficient documentation

## 2020-01-24 DIAGNOSIS — Z170 Estrogen receptor positive status [ER+]: Secondary | ICD-10-CM

## 2020-01-24 DIAGNOSIS — Z79810 Long term (current) use of selective estrogen receptor modulators (SERMs): Secondary | ICD-10-CM | POA: Insufficient documentation

## 2020-01-24 DIAGNOSIS — F419 Anxiety disorder, unspecified: Secondary | ICD-10-CM | POA: Insufficient documentation

## 2020-01-24 DIAGNOSIS — M8580 Other specified disorders of bone density and structure, unspecified site: Secondary | ICD-10-CM | POA: Diagnosis not present

## 2020-01-24 LAB — CMP (CANCER CENTER ONLY)
ALT: 15 U/L (ref 0–44)
AST: 28 U/L (ref 15–41)
Albumin: 3.7 g/dL (ref 3.5–5.0)
Alkaline Phosphatase: 59 U/L (ref 38–126)
Anion gap: 7 (ref 5–15)
BUN: 17 mg/dL (ref 6–20)
CO2: 30 mmol/L (ref 22–32)
Calcium: 9.5 mg/dL (ref 8.9–10.3)
Chloride: 103 mmol/L (ref 98–111)
Creatinine: 0.73 mg/dL (ref 0.44–1.00)
GFR, Est AFR Am: >60 mL/min (ref >60)
GFR, Estimated: >60 mL/min (ref >60)
Glucose, Bld: 86 mg/dL (ref 70–99)
Potassium: 4.4 mmol/L (ref 3.5–5.1)
Sodium: 140 mmol/L (ref 135–145)
Total Bilirubin: 0.4 mg/dL (ref 0.3–1.2)
Total Protein: 7 g/dL (ref 6.5–8.1)

## 2020-01-24 LAB — CBC WITH DIFFERENTIAL (CANCER CENTER ONLY)
Abs Immature Granulocytes: 0.01 10*3/uL (ref 0.00–0.07)
Basophils Absolute: 0 10*3/uL (ref 0.0–0.1)
Basophils Relative: 1 %
Eosinophils Absolute: 0.1 10*3/uL (ref 0.0–0.5)
Eosinophils Relative: 1 %
HCT: 36.5 % (ref 36.0–46.0)
Hemoglobin: 12.5 g/dL (ref 12.0–15.0)
Immature Granulocytes: 0 %
Lymphocytes Relative: 25 %
Lymphs Abs: 1.5 10*3/uL (ref 0.7–4.0)
MCH: 30.6 pg (ref 26.0–34.0)
MCHC: 34.2 g/dL (ref 30.0–36.0)
MCV: 89.5 fL (ref 80.0–100.0)
Monocytes Absolute: 0.4 10*3/uL (ref 0.1–1.0)
Monocytes Relative: 7 %
Neutro Abs: 4 10*3/uL (ref 1.7–7.7)
Neutrophils Relative %: 66 %
Platelet Count: 306 10*3/uL (ref 150–400)
RBC: 4.08 MIL/uL (ref 3.87–5.11)
RDW: 11.3 % — ABNORMAL LOW (ref 11.5–15.5)
WBC Count: 6.1 10*3/uL (ref 4.0–10.5)
nRBC: 0 % (ref 0.0–0.2)

## 2020-01-24 NOTE — Progress Notes (Signed)
[place]  [phone] [phone]     ID: Stephanie [name] DOB: 1965-10-14  MR#: [id number]  QHU#:[id number]  Patient Care Team: [name], MD as PCP - General (Family Medicine) Kennith Center, RD as Dietitian (Family Medicine) Donnamae Jude, MD as Consulting Physician (Obstetrics and Gynecology) Magrinat, Virgie Dad, MD as Consulting Physician (Oncology) [name], MD as Consulting Physician (General Surgery) Eppie Gibson, MD as Attending Physician (Radiation Oncology) Scot Dock, NP OTHER MD:  CHIEF COMPLAINT: Ductal carcinoma in situ  CURRENT TREATMENT: tamoxifen   INTERVAL HISTORY: Stephanie "[name]" returns today for follow up of her noninvasive breast cancer.  She is taking Tamoxifen daily.  At her last visit with Dr. Jana Hakim she was experiencing hot flashes and was started on Gabapentin at bedtime.  She notes her hot flashes are better at night.    She underwent bilateral diagnostic mammogram with CAD and TOMO on 01/19/2020 that showed no evidence of breast malignancy and breast density category C.  She underwent bone density testing on 01/05/2020 that showed osteopenia with a T score of -2.3 in her L1-L4 spine.    She has been prescribed Fosamax weekly for her bones, and is taking calcium and vitamin D.  She notes that she had missed doses of Fosamax due to it being weekly and more difficult to remember.  Due to her difficulty with taking Fosamax, her osteopenia has worsened.   REVIEW OF SYSTEMS: Sanyiah is doing moderately well today.  She is exercising regularly with restorative yoga 3 days a week and walking two days a week.  She has lost about 5 pounds over the past 6 months.  She notes she is due for colon cancer screening.  She sees GYN regularly.  She has had no vaginal bleeding.  She denies any other issues today and a detailed ROS was otherwise non contributory.    HISTORY OF CURRENT ILLNESS: From the original  intake note:  ALEE GRESSMAN has a history of left breast lumpectomy ([id number]) performed on 08/15/2015 for fibroscystic changes with calcifications. She underwent a second left breast lumpectomy on 12/30/2016 [phone]) for lobular neoplasia, complex sclerosing lesion, and fibrocystic change and sclerosing adenosis with calcifications.  She had routine screening mammography on 12/14/2017 showing a possible abnormality in the left breast. She underwent left diagnostic mammogram on 01/27/2018, which showed: breast density category C; probably benign left breast calcifications, most likely fat necrosis (measurements not given). Short term follow up was recommended.  She returned for follow up on 09/29/2018 (likely delayed due to the pandemic) and underwent diagnostic left mammogram. This showed: breast density category C; unchanged 1.5 cm group of calcifications at the lumpectomy site; 1.2 cm group of calcifications within the anterior upper-outer left breast. Short term follow up was again recommended.  She bilateral diagnostic mammography with tomography at [facility] on 01/03/2019 showing: breast density category C; stable probably benign calcifications at the left lumpectomy site; 1.5 cm grouped calcifications within the upper-outer quadrant of the left breast with suspicious morphology and distribution; no evidence of right breast malignancy.   Accordingly on 01/06/2019 she proceeded to biopsy of the left breast area in question. The pathology from this procedure ([id number]) showed: ductal carcinoma in situ, high grade. Prognostic indicators significant for: estrogen receptor, 100% positive and progesterone receptor, 80% positive, both with strong staining intensity.   She underwent genetic counseling on 01/16/2019, which showed negative results.  She also underwent biopsy of the calcifications at  the left lumpectomy site on 01/17/2019. Pathology [id number]) showed: focal fibrocystic changes;  dense fibrosis with pigmented histiocytes and foreign body giant cells.  She opted to proceed with left breast lumpectomy on 02/09/2019 under Dr. Donne Hazel. Pathology from the procedure ([id number]) showed:  1. Left Breast, lumpectomy  - fibrocystic changes 2. Left Breast, lumpectomy  - ductal carcinoma in situ with calcifications, intermediate grade, spanning 2.4 cm  - lobular neoplasia  -negative resection margins   The patient's subsequent history is as detailed below.   PAST MEDICAL HISTORY: Past Medical History:  Diagnosis Date  . Anxiety   . Breast mass, left   . Cancer ([place])   . Complication of anesthesia    felt lethargic x 4 days following last surgery in 2017, but 2018 did okay  . Depression   . Headache    migraines  . History of radiation therapy 03/13/19- 04/11/19   Left Breast 16 fraction of 2. 66 Gy each to total 42.56 Gy. Left breast boost 4 fractions of 2 Gy to total 8 Gy.   . Osteopenia     PAST SURGICAL HISTORY: Past Surgical History:  Procedure Laterality Date  . BREAST BIOPSY Left 07/10/2015   high risk stereo  . BREAST EXCISIONAL BIOPSY Left 08/15/2015   ADH  . BREAST LUMPECTOMY WITH RADIOACTIVE SEED LOCALIZATION Left 08/15/2015   Procedure:  LEFT BREAST LUMPECTOMY WITH RADIOACTIVE SEED LOCALIZATION;  Surgeon: Erroll Luna, MD;  Location: [place];  Service: General;  Laterality: Left;  . BREAST LUMPECTOMY WITH RADIOACTIVE SEED LOCALIZATION Left 02/09/2019   Procedure: LEFT BREAST LUMPECTOMY WITH RADIOACTIVE SEED LOCALIZATION;  Surgeon: [name], MD;  Location: [place];  Service: General;  Laterality: Left;  . EXCISION OF BREAST BIOPSY Left 12/30/2016   Benign ALH sclerosing lesion  . RADIOACTIVE SEED GUIDED EXCISIONAL BREAST BIOPSY Left 12/30/2016   Procedure: RADIOACTIVE SEED GUIDED EXCISIONAL LEFT BREAST BIOPSY;  Surgeon: [name], MD;  Location: [place];  Service: General;  Laterality: Left;   . RADIOACTIVE SEED GUIDED EXCISIONAL BREAST BIOPSY Left 02/09/2019   Procedure: RADIOACTIVE SEED GUIDED EXCISIONAL LEFT  BREAST BIOPSY;  Surgeon: [name], MD;  Location: [place];  Service: General;  Laterality: Left;  . TONSILLECTOMY  Age 52  . TRIGGER FINGER RELEASE  2010   Right Thumb    FAMILY HISTORY: Family History  Problem Relation Age of Onset  . Depression Brother   . Atrial fibrillation Brother   . Hypertension Mother   . Osteoporosis Mother   . ADD / ADHD Son   . ADD / ADHD Daughter   . Diabetes Maternal Grandmother   . Diabetes Maternal Grandfather   . Diabetes Paternal Grandmother   . Diabetes Paternal Grandfather   . Parkinson's disease Maternal Aunt    Patient's father was 88 years old when he died from complications of cardiomyopathy.  He was adopted and there is no family history on his side. Patient's mother is 90 years old as of November 2020. The patient has 3 brothers, no sisters.  She denies/or notes a family hx of breast, prostate, pancreatic or ovarian cancer.    GYNECOLOGIC HISTORY:  Patient's last menstrual period was 01/25/2014 (approximate). Menarche: 54 years old Age at first live birth: Any 54 years old GX P 2 LMP 49 Contraceptive oral contraceptives at least 7 years, with no complications HRT 9 months, discontinued because of malaise  Hysterectomy? no BSO?  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  She is currently doing preadmissions at the new [facility].  Her husband Nicole Kindred works in Radio producer..  Daughter Apolonio Schneiders, 23, lives in [place] and works as a Immunologist.  Son Erie, [place], with Asperger's, currently attends [employer], and lives at home with the patient.     ADVANCED DIRECTIVES: In the absence of any documents to the contrary the patient's husband is her healthcare power of attorney   HEALTH MAINTENANCE: Social History    Tobacco Use  . Smoking status: Never Smoker  . Smokeless tobacco: Never Used  Vaping Use  . Vaping Use: Never used  Substance Use Topics  . Alcohol use: No    Comment: rare use  . Drug use: No     Colonoscopy: not on file  PAP: 08/2017, negative  Bone density: 11/2017, -2.0   Allergies  Allergen Reactions  . Wellbutrin Xl [Bupropion] Hives    Current Outpatient Medications  Medication Sig Dispense Refill  . alendronate (FOSAMAX) 70 MG tablet TAKE 1 TABLET BY MOUTH ONCE A WEEK. TAKE WITH A FULL GLASS OF WATER ON AN EMPTY STOMACH 12 tablet 3  . Biotin w/ Vitamins C & E (HAIR/SKIN/NAILS PO) Take 1 tablet by mouth daily.     . Calcium Carb-Cholecalciferol (CALCIUM + D3 PO) Take 1 tablet by mouth daily.     . calcium carbonate (OS-CAL) 1250 (500 Ca) MG chewable tablet Chew 1 tablet by mouth daily.    . clobetasol (TEMOVATE) 0.05 % GEL Apply 1 Dose topically 2 (two) times daily. 2x/day x 3-4 wks, then 1x/day x 4 wks, then every other day x  4 wks, then 2x/wk 60 g 3  . estradiol (ESTRING) 2 MG vaginal ring Place 2 mg vaginally every 3 (three) months. follow package directions 1 each 12  . gabapentin (NEURONTIN) 300 MG capsule Take 1 capsule (300 mg total) by mouth at bedtime. 90 capsule 4  . predniSONE (DELTASONE) 20 MG tablet Take 2 tablets (40 mg total) by mouth daily. 10 tablet 0  . scopolamine (TRANSDERM-SCOP, 1.5 MG,) 1 MG/3DAYS Place 1 patch (1.5 mg total) onto the skin every 3 (three) days. 4 patch 2  . sertraline (ZOLOFT) 50 MG tablet Take 2 tablets (100 mg total) by mouth daily. 90 tablet 1  . tamoxifen (NOLVADEX) 20 MG tablet Take 20 mg by mouth daily.    . TURMERIC PO Take by mouth.     No current facility-administered medications for this visit.    OBJECTIVE:  Vitals:   01/24/20 1100  BP: (!) 114/59  Pulse: (!) 59  Resp: 17  Temp: 98 F (36.7 C)  SpO2: 100%     Body mass index is 26.32 kg/m.   Wt Readings from Last 3 Encounters:  01/24/20 148 lb 9.6 oz (67.4  kg)  12/04/19 149 lb (67.6 kg)  10/24/19 149 lb (67.6 kg)      ECOG FS:0 - Asymptomatic GENERAL: Patient is a well appearing female in no acute distress HEENT:  Sclerae anicteric.  Mask in place.  Neck is supple.  NODES:  No cervical, supraclavicular, or axillary lymphadenopathy palpated.  BREAST EXAM:  Left breast s/p lumpectomy, no sign of local recurrence, right breast benign LUNGS:  Clear to auscultation bilaterally.  No wheezes or rhonchi. HEART:  Regular rate and rhythm. No murmur appreciated. ABDOMEN:  Soft, nontender.  Positive, normoactive bowel sounds. No organomegaly palpated. MSK:  No  focal spinal tenderness to palpation. Full range of motion bilaterally in the upper extremities. EXTREMITIES:  No peripheral edema.   SKIN:  Clear with no obvious rashes or skin changes. No nail dyscrasia. NEURO:  Nonfocal. Well oriented.  Appropriate affect.     LAB RESULTS:  CMP     Component Value Date/Time   NA 142 10/24/2019 1116   K 4.3 10/24/2019 1116   CL 101 10/24/2019 1116   CO2 27 10/24/2019 1116   GLUCOSE 75 10/24/2019 1116   GLUCOSE 138 (H) 02/02/2019 1000   BUN 11 10/24/2019 1116   CREATININE 0.69 10/24/2019 1116   CALCIUM 9.7 10/24/2019 1116   PROT 7.1 10/24/2019 1116   ALBUMIN 4.6 10/24/2019 1116   AST 29 10/24/2019 1116   ALT 13 10/24/2019 1116   ALKPHOS 81 10/24/2019 1116   BILITOT <0.2 10/24/2019 1116   GFRNONAA 100 10/24/2019 1116   GFRAA 115 10/24/2019 1116    No results found for: TOTALPROTELP, ALBUMINELP, A1GS, A2GS, BETS, BETA2SER, GAMS, MSPIKE, SPEI  No results found for: KPAFRELGTCHN, LAMBDASER, KAPLAMBRATIO  Lab Results  Component Value Date   WBC 6.1 01/24/2020   NEUTROABS 4.0 01/24/2020   HGB 12.5 01/24/2020   HCT 36.5 01/24/2020   MCV 89.5 01/24/2020   PLT 306 01/24/2020   No results found for: LABCA2  No components found for: XBDZHG992  No results for input(s): INR in the last 168 hours.  No results found for: LABCA2  No results  found for: EQA834  No results found for: HDQ222  No results found for: LNL892  No results found for: CA2729  No components found for: HGQUANT  No results found for: CEA1 / No results found for: CEA1   No results found for: AFPTUMOR  No results found for: CHROMOGRNA  No results found for: HGBA, HGBA2QUANT, HGBFQUANT, HGBSQUAN (Hemoglobinopathy evaluation)   No results found for: LDH  No results found for: IRON, TIBC, IRONPCTSAT (Iron and TIBC)  No results found for: FERRITIN  Urinalysis    Component Value Date/Time   LABSPEC 1.025 09/09/2009 1037   PHURINE 6.0 09/09/2009 1037   GLUCOSEU NEGATIVE 09/09/2009 1037   HGBUR TRACE (A) 09/09/2009 1037   BILIRUBINUR NEGATIVE 09/09/2009 [place] 09/09/2009 1037   PROTEINUR NEGATIVE 09/09/2009 1037   UROBILINOGEN 0.2 09/09/2009 1037   NITRITE NEGATIVE 09/09/2009 1037   LEUKOCYTESUR  09/09/2009 1037    NEGATIVE Biochemical Testing Only. Please order routine urinalysis from main lab if confirmatory testing is needed.    STUDIES: DG Bone Density  Result Date: 01/05/2020 EXAM: DUAL X-RAY ABSORPTIOMETRY (DXA) FOR BONE MINERAL DENSITY IMPRESSION: Referring Physician:  Donnamae Jude Your patient completed a BMD test using Lunar IDXA DXA system ( analysis version: 16 ) manufactured by [employer]. Technologist: AW PATIENT: Name: Tanylah, Schnoebelen Patient ID: [id number] Birth Date: 06-09-1965 Height: 63.0 in. Sex: Female Measured: 01/05/2020 Weight: 149.0 lbs. Indications: Breast Cancer History, Caucasian, Depression, Estrogen Deficient, Family History of Osteoporosis, Height Loss (781.91), Postmenopausal, Tamoxifen, Zoloft Fractures: None Treatments: Calcium (E943.0), Fosamax, Vitamin D (E933.5) ASSESSMENT: The BMD measured at AP Spine L1-L4 (L2,L3) is 0.894 g/cm2 with a T-score of -2.3. This patient is considered osteopenic according to [facility]) criteria. The scan quality is good. L-2 and L-3 were  excluded due to degenerative changes. Site Region Measured Date Measured Age YA BMD Significant CHANGE T-score AP Spine L1-L4 (L2,L3) 01/05/2020 54.0 -2.3 0.894 g/cm2 AP Spine L1-L4 (L2,L3) 12/14/2017 52.0 -2.1 0.911 g/cm2  DualFemur Total Left 01/05/2020 54.0 -0.9 0.890 g/cm2 DualFemur Total Left 12/14/2017 52.0 -1.0 0.884 g/cm2 DualFemur Total Mean 01/05/2020 54.0 -0.7 0.916 g/cm2 DualFemur Total Mean 12/14/2017 52.0 -0.8 0.907 g/cm2 World Health Organization [facility]) criteria for post-menopausal, Caucasian Women: Normal       T-score at or above -1 SD Osteopenia   T-score between -1 and -2.5 SD Osteoporosis T-score at or below -2.5 SD RECOMMENDATION: 1. All patients should optimize calcium and vitamin D intake. 2. Consider FDA approved medical therapies in postmenopausal women and men aged 81 years and older, based on the following: a. A hip or vertebral (clinical or morphometric) fracture b. T- score < or = -2.5 at the femoral neck or spine after appropriate evaluation to exclude secondary causes c. Low bone mass (T-score between -1.0 and -2.5 at the femoral neck or spine) and a 10 year probability of a hip fracture > or = 3% or a 10 year probability of a major osteoporosis-related fracture > or = 20% based on the [place]-adapted WHO algorithm d. Clinician judgment and/or patient preferences may indicate treatment for people with 10-year fracture probabilities above or below these levels FOLLOW-UP: Patients with diagnosis of osteoporosis or at high risk for fracture should have regular bone mineral density tests. For patients eligible for Medicare, routine testing is allowed once every 2 years. The testing frequency can be increased to one year for patients who have rapidly progressing disease, those who are receiving or discontinuing medical therapy to restore bone mass, or have additional risk factors. I have reviewed this report and agree with the above findings. [facility] FRAX* 10-year Probability of  Fracture Based on femoral neck BMD: DualFemur (Left) Major Osteoporotic Fracture: 4.9% Hip Fracture:                0.1% Population:                  [place] (Caucasian) Risk Factors:                None *FRAX is a Materials engineer of the [employer] for Metabolic Bone Disease, a World Pharmacologist (WHO) [facility]. ASSESSMENT: The probability of a major osteoporotic fracture is 4.9 % within the next ten years. The probability of a hip fracture is 0.1 % within the next ten years. Electronically Signed   By: Lowella Grip III M.D.   On: 01/05/2020 09:53   MM DIAG BREAST TOMO BILATERAL  Result Date: 01/19/2020 CLINICAL DATA:  54 year old female for annual follow-up. History of LEFT breast DCIS and lumpectomy in 2020.History of prior LEFT breast excision of lobular neoplasia. EXAM: DIGITAL DIAGNOSTIC BILATERAL MAMMOGRAM WITH CAD AND TOMO COMPARISON:  Previous exam(s). ACR Breast Density Category c: The breast tissue is heterogeneously dense, which may obscure small masses. FINDINGS: 2D and 3D full field views of both breasts and a magnification view of the lumpectomy site demonstrate no suspicious mass, nonsurgical distortion or worrisome calcifications. LEFT lumpectomy changes are again noted. Mammographic images were processed with CAD. IMPRESSION: No evidence of breast malignancy. RECOMMENDATION: Bilateral diagnostic mammogram in 1 year, unless sooner if clinically indicated. I have discussed the findings and recommendations with the patient. If applicable, a reminder letter will be sent to the patient regarding the next appointment. BI-RADS CATEGORY  2: Benign. Electronically Signed   By: Margarette Canada M.D.   On: 01/19/2020 12:18     ELIGIBLE FOR AVAILABLE RESEARCH PROTOCOL: no  ASSESSMENT: 54 y.o. [place] woman status  post left breast biopsy 01/06/2023 ductal carcinoma in situ, grade 3, estrogen and progesterone receptor positive  (a) biopsy of a second  area of calcifications in the left breast on 01/17/2019 -  (1) status post left lumpectomy 02/09/2023 2.4 cm ductal carcinoma in situ, grade 2, with negative margins  (2) adjuvant radiation 03/13/2019 - 04/11/2019  (a) left breast / 42.56 Gy in 16 fractions  (b) boost / 8 Gy in 4 fractions  (3) tamoxifen started 04/28/2019  (4) genetics testing 01/19/2019 through the [facility] Breast Cancer STAT Panel + Common Hereditary Cancers Panel found no deleterious mutations in ATM, BRCA1, BRCA2, CDH1, CHEK2, PALB2, PTEN, STK11 and TP53 // APC, ATM, AXIN2, BARD1, BMPR1A, BRCA1, BRCA2, BRIP1, CDH1, CDKN2A (p14ARF), CDKN2A (p16INK4a), CKD4, CHEK2, CTNNA1, DICER1, EPCAM (Deletion/duplication testing only), GREM1 (promoter region deletion/duplication testing only), KIT, MEN1, MLH1, MSH2, MSH3, MSH6, MUTYH, NBN, NF1, NHTL1, PALB2, PDGFRA, PMS2, POLD1, POLE, PTEN, RAD50, RAD51C, RAD51D, SDHB, SDHC, SDHD, SMAD4, SMARCA4. STK11, TP53, TSC1, TSC2, and VHL.  The following genes were evaluated for sequence changes only: SDHA and HOXB13 c.251G>A variant only.   PLAN: Carsyn is here today for follow up of her estrogen receptor positive breast cancer.  She has no clinical or radiographic sign of breast cancer recurrence.  She continues on Tamoxifen and tolerates this well with the exception of hot flashes during the day which she notes are tolerable.   She does take gabapentin at night and will continue this as helps with her night time hot flashes.    [name] has osteopenia that is worsening.  The tamoxifen does have a protective effect on the bones, and she does have fosamax prescribed, however she has difficulty taking it.  We discussed potentially changing to Prolia.  She is going to work on taking the fosamax regularly, and if in one month she is still struggling, she will call us and we will get her set up for the prolia.  I reviewed with her risks and benefits of the treatment, and she understands.    I gave [name] a  handout in her AVS about bone health, calcium, vitamin d, and weight bearing exercise.  She will be due for her mammogram in 12/2020 and bone density in 12/2021.  [name] was recommended to continue with healthy diet and exercise.  She is up to date with GYN screening, and is overdue for colon cancer screening.  I recommended that she get this scheduled.    [name] will return in 6 months to see Dr. Donne Hazel, and Dr. Jana Hakim will see her in a year.  She knows to call for any questions that may arise between now and her next appointment.  We are happy to see her sooner if needed.   Total encounter time: 20 minutes  Wilber Bihari, NP 01/24/20 11:25 AM Medical Oncology and Hematology [facility] [place], [place] [address] Tel. [phone]    Fax. [phone]   *Total Encounter Time as defined by the Centers for Medicare and Medicaid Services includes, in addition to the face-to-face time of a patient visit (documented in the note above) non-face-to-face time: obtaining and reviewing outside history, ordering and reviewing medications, tests or procedures, care coordination (communications with other health care professionals or caregivers) and documentation in the medical record.

## 2020-01-24 NOTE — Patient Instructions (Signed)

## 2020-01-25 ENCOUNTER — Telehealth: Payer: Self-pay | Admitting: Oncology

## 2020-01-25 NOTE — Telephone Encounter (Signed)
Scheduled appts per 9/29 los. Left voicemail with appt date and time.

## 2020-01-27 ENCOUNTER — Encounter: Payer: Self-pay | Admitting: Oncology

## 2020-01-29 MED FILL — TAMOXIFEN 20 MG TABLET: 20 | 90 days supply | Qty: 90 | Fill #3

## 2020-01-29 MED FILL — ESTRING 2 MG VAGINAL RING: 2 | 90 days supply | Qty: 1 | Fill #3

## 2020-02-05 ENCOUNTER — Encounter: Payer: Self-pay | Admitting: Oncology

## 2020-02-23 ENCOUNTER — Other Ambulatory Visit: Payer: Self-pay | Admitting: Oncology

## 2020-02-23 ENCOUNTER — Other Ambulatory Visit: Payer: Self-pay | Admitting: *Deleted

## 2020-02-23 MED ORDER — SERTRALINE HCL 50 MG PO TABS: ORAL_TABLET | ORAL | 1 refills | Status: DC

## 2020-02-23 MED FILL — SERTRALINE HCL 50 MG TABLET: 50 | 90 days supply | Qty: 90 | Fill #0

## 2020-03-06 MED FILL — SERTRALINE HCL 50 MG TABLET: 50 | 90 days supply | Qty: 90 | Fill #0

## 2020-03-15 MED FILL — ALENDRONATE NA 70 MG TAB: 70 | 84 days supply | Qty: 12 | Fill #1

## 2020-03-29 ENCOUNTER — Other Ambulatory Visit: Payer: Self-pay | Admitting: Oncology

## 2020-03-29 MED FILL — GABAPENTIN 300 MG CAPSULE: 300 | 90 days supply | Qty: 90 | Fill #3

## 2020-04-01 ENCOUNTER — Other Ambulatory Visit: Payer: Self-pay | Admitting: Oncology

## 2020-04-30 ENCOUNTER — Other Ambulatory Visit: Payer: Self-pay | Admitting: Oncology

## 2020-04-30 ENCOUNTER — Encounter: Payer: Self-pay | Admitting: Oncology

## 2020-04-30 MED FILL — TAMOXIFEN 20 MG TABLET: 20 | 90 days supply | Qty: 90 | Fill #0

## 2020-05-08 MED FILL — ESTRING 2 MG VAGINAL RING: 2 | 90 days supply | Qty: 1 | Fill #0

## 2020-05-31 ENCOUNTER — Telehealth: Payer: 59 | Admitting: Physician Assistant

## 2020-05-31 DIAGNOSIS — U099 Post covid-19 condition, unspecified: Secondary | ICD-10-CM

## 2020-05-31 NOTE — Progress Notes (Signed)
E-Visit for Corona Virus Screening  We are sorry you are not feeling well. We are here to help!  You have tested positive for COVID-19, meaning that you were infected with the novel coronavirus and could give the virus to others.  It is vitally important that you stay home so you do not spread it to others.      Please continue isolation at home, for at least 10 days since the start of your symptoms and until you have had 24 hours with no fever (without taking a fever reducer) and with improving of symptoms.  If you have no symptoms but tested positive (or all symptoms resolve after 5 days and you have no fever) you can leave your house but continue to wear a mask around others for an additional 5 days. If you have a fever,continue to stay home until you have had 24 hours of no fever. Most cases improve 5-10 days from onset but we have seen a small number of patients who have gotten worse after the 10 days.  Please be sure to watch for worsening symptoms and remain taking the proper precautions.   Since you are having a recurrence of fever I would recommend you stay at home so we can monitor. If you are otherwise feeling better overall then that is a reassuring sign. Keep hydrated. Rest. Tylenol or Motrin for fever > 101 or if fever >100 and you feel bad. I would keep home until fever free again for 24 hours without medication. If you note any worsening of fever or recurrence of respiratory symptoms, this could indicate a secondary bronchitis or pneumonia for which you would need evaluation and potential chest x-ray.   Go to the nearest hospital ED for assessment if fever/cough/breathlessness are severe or illness seems like a threat to life.    The following symptoms may appear 2-14 days after exposure: . Fever . Cough . Shortness of breath or difficulty breathing . Chills . Repeated shaking with chills . Muscle pain . Headache . Sore throat . New loss of taste or  smell . Fatigue . Congestion or runny nose . Nausea or vomiting . Diarrhea  You have been enrolled in [facility] for COVID-19. Daily you will receive a questionnaire within the [facility] website. Our COVID-19 response team will be monitoring your responses daily.    You may also take acetaminophen (Tylenol) as needed for fever.  HOME CARE: . Only take medications as instructed by your medical team. . Drink plenty of fluids and get plenty of rest. . A steam or ultrasonic humidifier can help if you have congestion.   GET HELP RIGHT AWAY IF YOU HAVE EMERGENCY WARNING SIGNS.  Call 911 or proceed to your closest emergency facility if: . You develop worsening high fever. . Trouble breathing . Bluish lips or face . Persistent pain or pressure in the chest . New confusion . Inability to wake or stay awake . You cough up blood. . Your symptoms become more severe . Inability to hold down food or fluids  This list is not all possible symptoms. Contact your medical provider for any symptoms that are severe or concerning to you.    Your e-visit answers were reviewed by a board certified advanced clinical practitioner to complete your personal care plan.  Depending on the condition, your plan could have included both over the counter or prescription medications.  If there is a problem please reply once you have received a response from your  provider.  Your safety is important to [place].  If you have drug allergies check your prescription carefully.    You can use MyChart to ask questions about today's visit, request a non-urgent call back, or ask for a work or school excuse for 24 hours related to this e-Visit. If it has been greater than 24 hours you will need to follow up with your provider, or enter a new e-Visit to address those concerns. You will get an e-mail in the next two days asking about your experience.  I hope that your e-visit has been valuable and will speed your  recovery. Thank you for using e-visits.

## 2020-05-31 NOTE — Progress Notes (Signed)
I have spent 5 minutes in review of e-visit questionnaire, review and updating patient chart, medical decision making and response to patient.   Jode Lippe Cody Jalicia Roszak, PA-C    

## 2020-06-07 MED FILL — SERTRALINE HCL 50 MG TABLET: 50 | 90 days supply | Qty: 90 | Fill #1

## 2020-06-07 MED FILL — ALENDRONATE NA 70 MG TAB: 70 | 84 days supply | Qty: 12 | Fill #2

## 2020-07-03 ENCOUNTER — Other Ambulatory Visit: Payer: Self-pay | Admitting: Oncology

## 2020-07-03 MED FILL — GABAPENTIN 300 MG CAPSULE: 300 | 90 days supply | Qty: 90 | Fill #0

## 2020-07-31 ENCOUNTER — Other Ambulatory Visit (HOSPITAL_COMMUNITY): Payer: Self-pay

## 2020-07-31 MED FILL — Tamoxifen Citrate Tab 20 MG (Base Equivalent): ORAL | 90 days supply | Qty: 90 | Fill #0 | Status: AC

## 2020-07-31 MED FILL — Estradiol Vaginal Ring 2 MG (7.5 MCG/24HRS): VAGINAL | 90 days supply | Qty: 1 | Fill #0 | Status: AC

## 2020-08-02 ENCOUNTER — Other Ambulatory Visit (HOSPITAL_COMMUNITY): Payer: Self-pay

## 2020-08-07 ENCOUNTER — Other Ambulatory Visit (HOSPITAL_COMMUNITY): Payer: Self-pay

## 2020-08-07 DIAGNOSIS — D0512 Intraductal carcinoma in situ of left breast: Secondary | ICD-10-CM | POA: Diagnosis not present

## 2020-08-21 ENCOUNTER — Encounter: Payer: Self-pay | Admitting: Radiology

## 2020-09-02 ENCOUNTER — Other Ambulatory Visit: Payer: Self-pay | Admitting: Oncology

## 2020-09-02 ENCOUNTER — Other Ambulatory Visit (HOSPITAL_COMMUNITY): Payer: Self-pay

## 2020-09-02 MED ORDER — SERTRALINE HCL 50 MG PO TABS
ORAL_TABLET | Freq: Every day | ORAL | 1 refills | Status: DC
  Filled 2020-09-02: qty 90, 90d supply, fill #0

## 2020-09-02 MED FILL — Alendronate Sodium Tab 70 MG: ORAL | 84 days supply | Qty: 12 | Fill #0 | Status: AC

## 2020-09-11 ENCOUNTER — Encounter: Payer: Self-pay | Admitting: Oncology

## 2020-10-04 ENCOUNTER — Other Ambulatory Visit (HOSPITAL_COMMUNITY): Payer: Self-pay

## 2020-10-04 MED FILL — Gabapentin Cap 300 MG: ORAL | 90 days supply | Qty: 90 | Fill #0 | Status: AC

## 2020-10-21 ENCOUNTER — Encounter: Payer: Self-pay | Admitting: Family Medicine

## 2020-10-21 ENCOUNTER — Encounter: Payer: Self-pay | Admitting: Oncology

## 2020-10-22 ENCOUNTER — Telehealth: Payer: Self-pay | Admitting: *Deleted

## 2020-10-22 NOTE — Telephone Encounter (Signed)
This RN spoke with pt per her my chart message stating onset of vaginal bleeding on Sat 6/25  Per phone inquiry- pt denies bleeding associated with intercourse or other activities.  She states she did have a normal BM pre blood on tissue - but " it was not from rectal area "  She removed her Estring and noted blood on it with mucus.  She wore a pad and had scant dark spotting overnight with resolution of bleeding by Sunday afternoon and no recurrence.  She is currently on tamoxifen with Estring support.  She has contacted her GYN per above- and is due for an annual U/S now schedule for 7/12 with GYN visit the following week.  Note pt states she is postmenopausal per cessation of menses and lab work in 2015.  Pt is very concerned about " I do not want to stop the tamoxifen or switch it to the other because I already have bone loss "  This RN discussed above - as well as presently to proceed with GYN plan and this office will follow up post results of U/S and GYN visit.  Stephanie [name] verbalized understanding and appreciation of discussion.

## 2020-10-29 ENCOUNTER — Other Ambulatory Visit (HOSPITAL_COMMUNITY): Payer: Self-pay

## 2020-10-29 MED FILL — Tamoxifen Citrate Tab 20 MG (Base Equivalent): ORAL | 90 days supply | Qty: 90 | Fill #1 | Status: AC

## 2020-10-29 MED FILL — Estradiol Vaginal Ring 2 MG (7.5 MCG/24HRS): VAGINAL | 90 days supply | Qty: 1 | Fill #1 | Status: AC

## 2020-10-31 ENCOUNTER — Other Ambulatory Visit (HOSPITAL_COMMUNITY): Payer: Self-pay

## 2020-11-05 ENCOUNTER — Ambulatory Visit
Admission: RE | Admit: 2020-11-05 | Discharge: 2020-11-05 | Disposition: A | Discharge status: Home / Self Care | Payer: 59 | Source: Ambulatory Visit | Attending: Family Medicine | Admitting: Family Medicine

## 2020-11-05 DIAGNOSIS — N950 Postmenopausal bleeding: Secondary | ICD-10-CM

## 2020-11-05 DIAGNOSIS — R9389 Abnormal findings on diagnostic imaging of other specified body structures: Secondary | ICD-10-CM | POA: Diagnosis not present

## 2020-11-06 ENCOUNTER — Telehealth: Payer: Self-pay | Admitting: *Deleted

## 2020-11-06 ENCOUNTER — Encounter: Payer: Self-pay | Admitting: Oncology

## 2020-11-06 NOTE — Telephone Encounter (Signed)
This RN spoke with pt per call from last week with onset of vaginal bleeding on tamoxifen and Estring.  [name] states the ultrasound shows endometrial thickening of 15 mm " and that is scaring me because then I read that 48% of women with thickening of 15 mm can be cancer"  This RN validated her concern. Discussed need to stop the tamoxifen at present as well as the Estring.  She is scheduled for biopsy on 8/3.  This RN informed her we will follow up post biopsy for further recommendations.  This RN also informed pt to call if needed or other concerns occur- pt verbalized understanding and appreciation.

## 2020-11-07 ENCOUNTER — Telehealth: Payer: Self-pay | Admitting: Family Medicine

## 2020-11-07 ENCOUNTER — Encounter: Payer: Self-pay | Admitting: Oncology

## 2020-11-07 NOTE — Telephone Encounter (Signed)
Options of endometrial sampling discussed. Wants to pursue in office hysteroscopy.

## 2020-11-10 IMAGING — US US BREAST*L* LIMITED INC AXILLA
1 series · 6 of 6 positions shown · non-contrast
Comparison: Previous exam(s).

CLINICAL DATA: Heaviness in left breast after surgery. Evaluate for
possible seroma.

EXAM:
DIGITAL DIAGNOSTIC LEFT MAMMOGRAM WITH CAD AND TOMO
ULTRASOUND LEFT BREAST

[Series 1: us breast*left* limited inc axilla · 0.07mm/px · 6 of 6 slices shown]
[im 1/6]
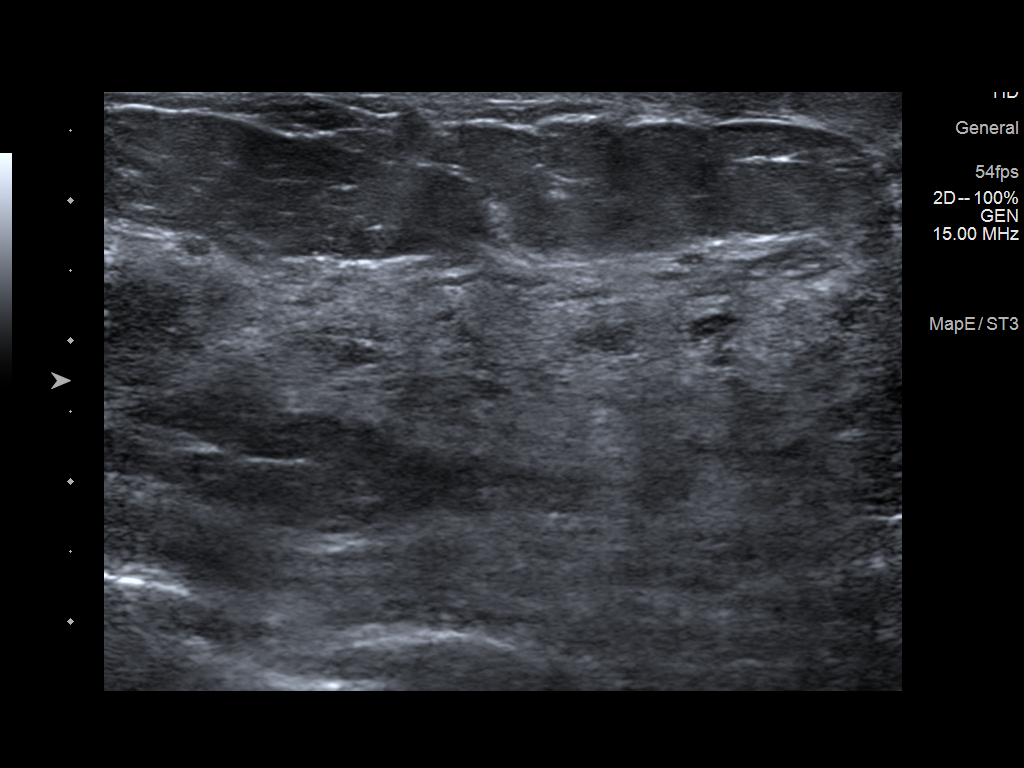
[im 2/6]
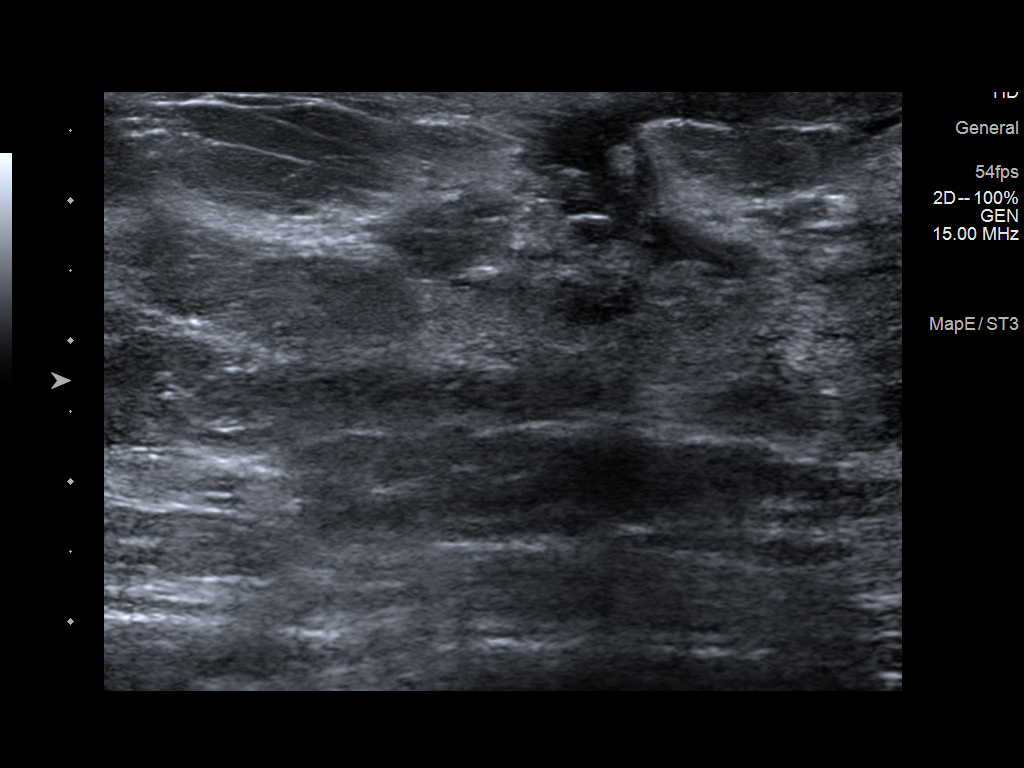
[im 3/6]
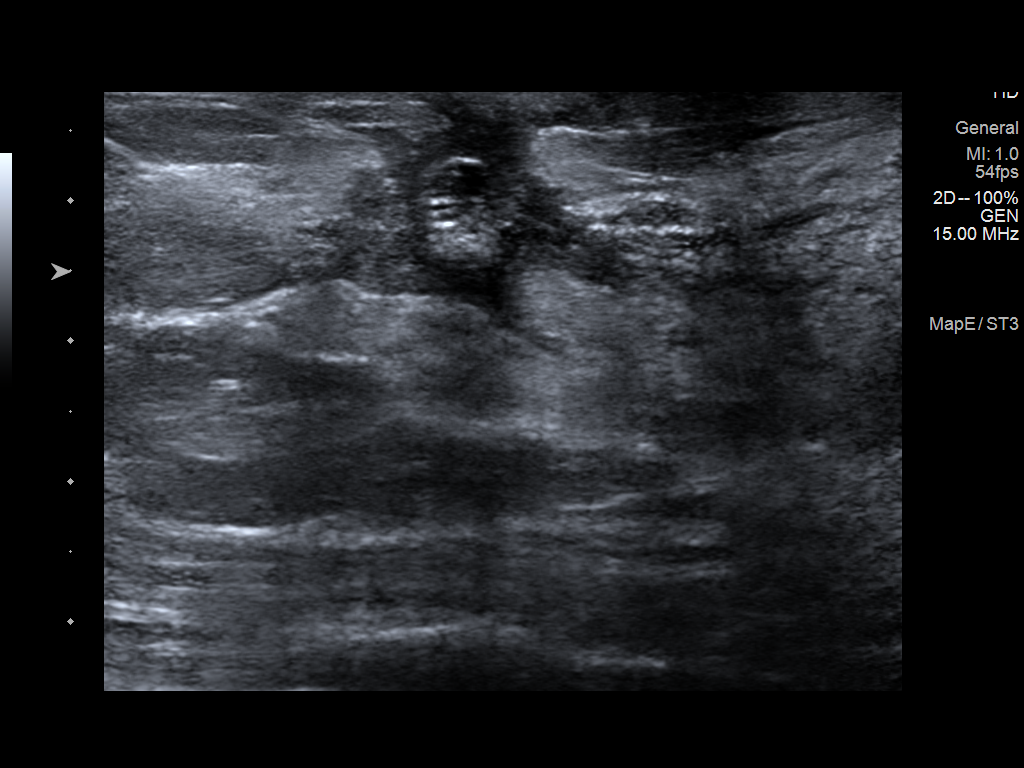
[im 4/6]
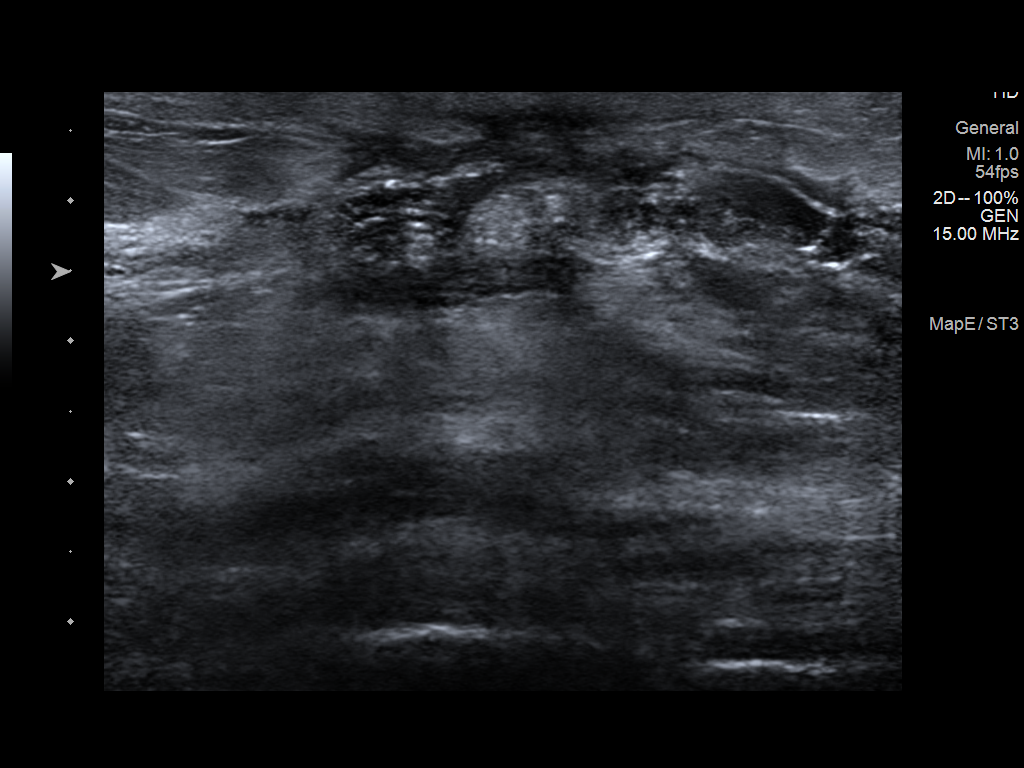
[im 5/6]
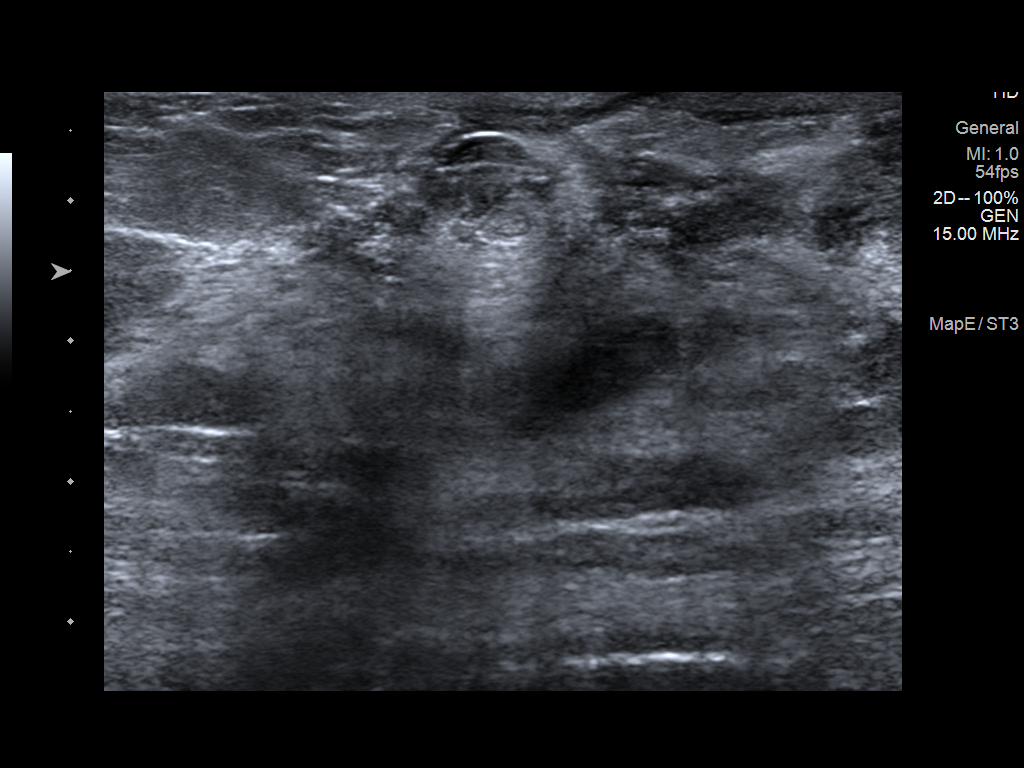
[im 6/6]
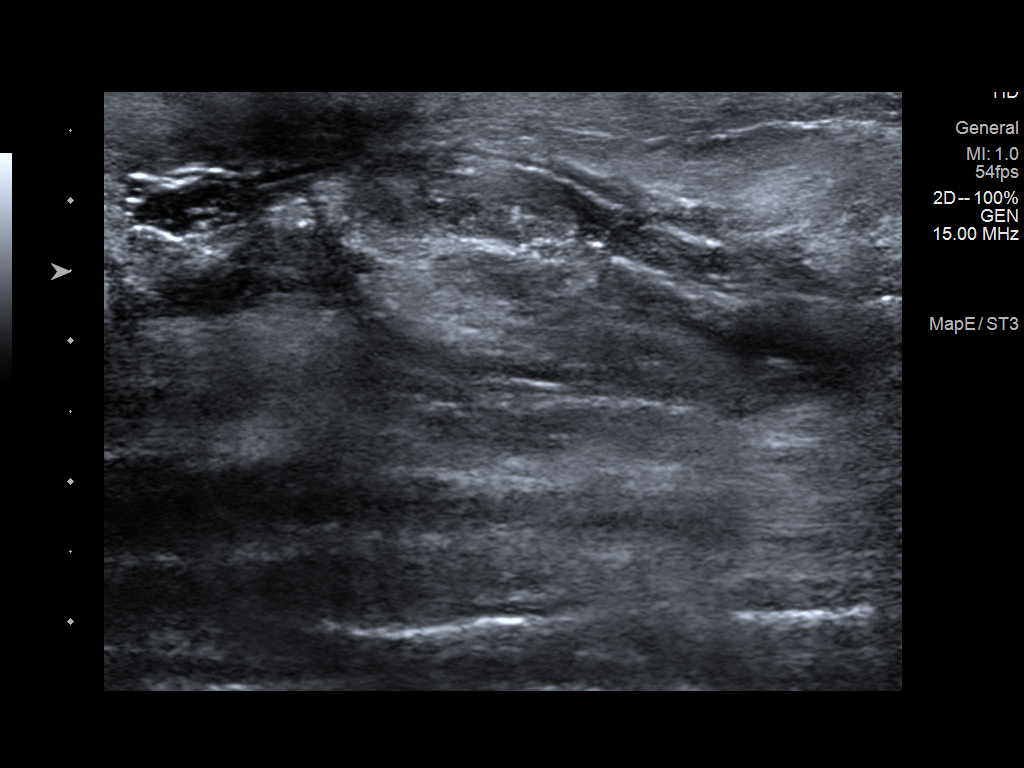

[6 of 6 positions shown; findings below may reference images not displayed]

ACR Breast Density Category c: The breast tissue is heterogeneously
dense, which may obscure small masses.
FINDINGS: Postoperative changes are seen in the left breast. No suspicious
masses, calcifications, distortion, or masses.

Mammographic images were processed with CAD.

On physical exam, no suspicious lumps are identified.

Targeted ultrasound is performed, showing postoperative changes with
no evidence of seroma.
IMPRESSION: Postoperative changes in the left breast. No evidence of malignancy
or seroma.

RECOMMENDATION:
Annual diagnostic mammography due in Friday December, 2019.

I have discussed the findings and recommendations with the patient.
If applicable, a reminder letter will be sent to the patient
regarding the next appointment.

BI-RADS CATEGORY  2: Benign.

## 2020-11-10 IMAGING — MG MM DIGITAL DIAGNOSTIC UNILAT*L* W/ TOMO W/ CAD
2 series · 3 of 6 positions shown · non-contrast
Comparison: Previous exam(s).

CLINICAL DATA: Heaviness in left breast after surgery. Evaluate for
possible seroma.

EXAM:
DIGITAL DIAGNOSTIC LEFT MAMMOGRAM WITH CAD AND TOMO
ULTRASOUND LEFT BREAST

[L CC synth-2D]
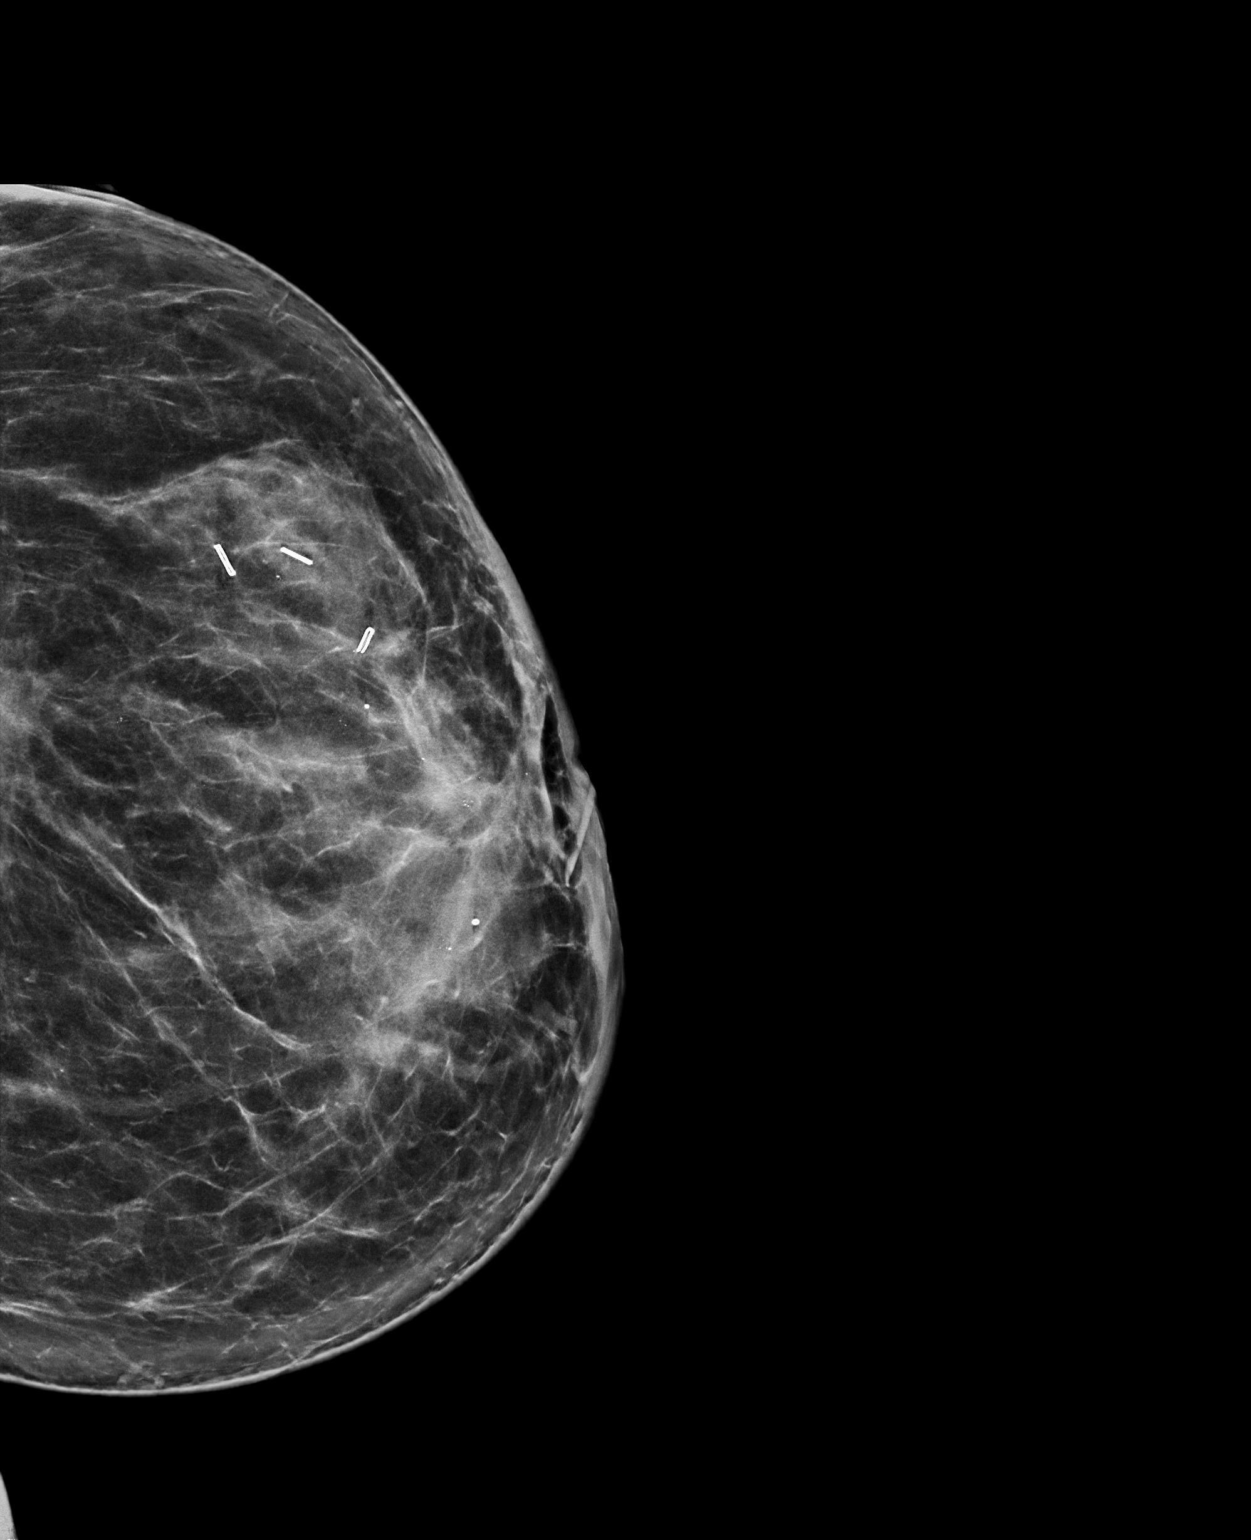

[L CC tomo · 2 of 86 frames shown]
[frame 28/86]
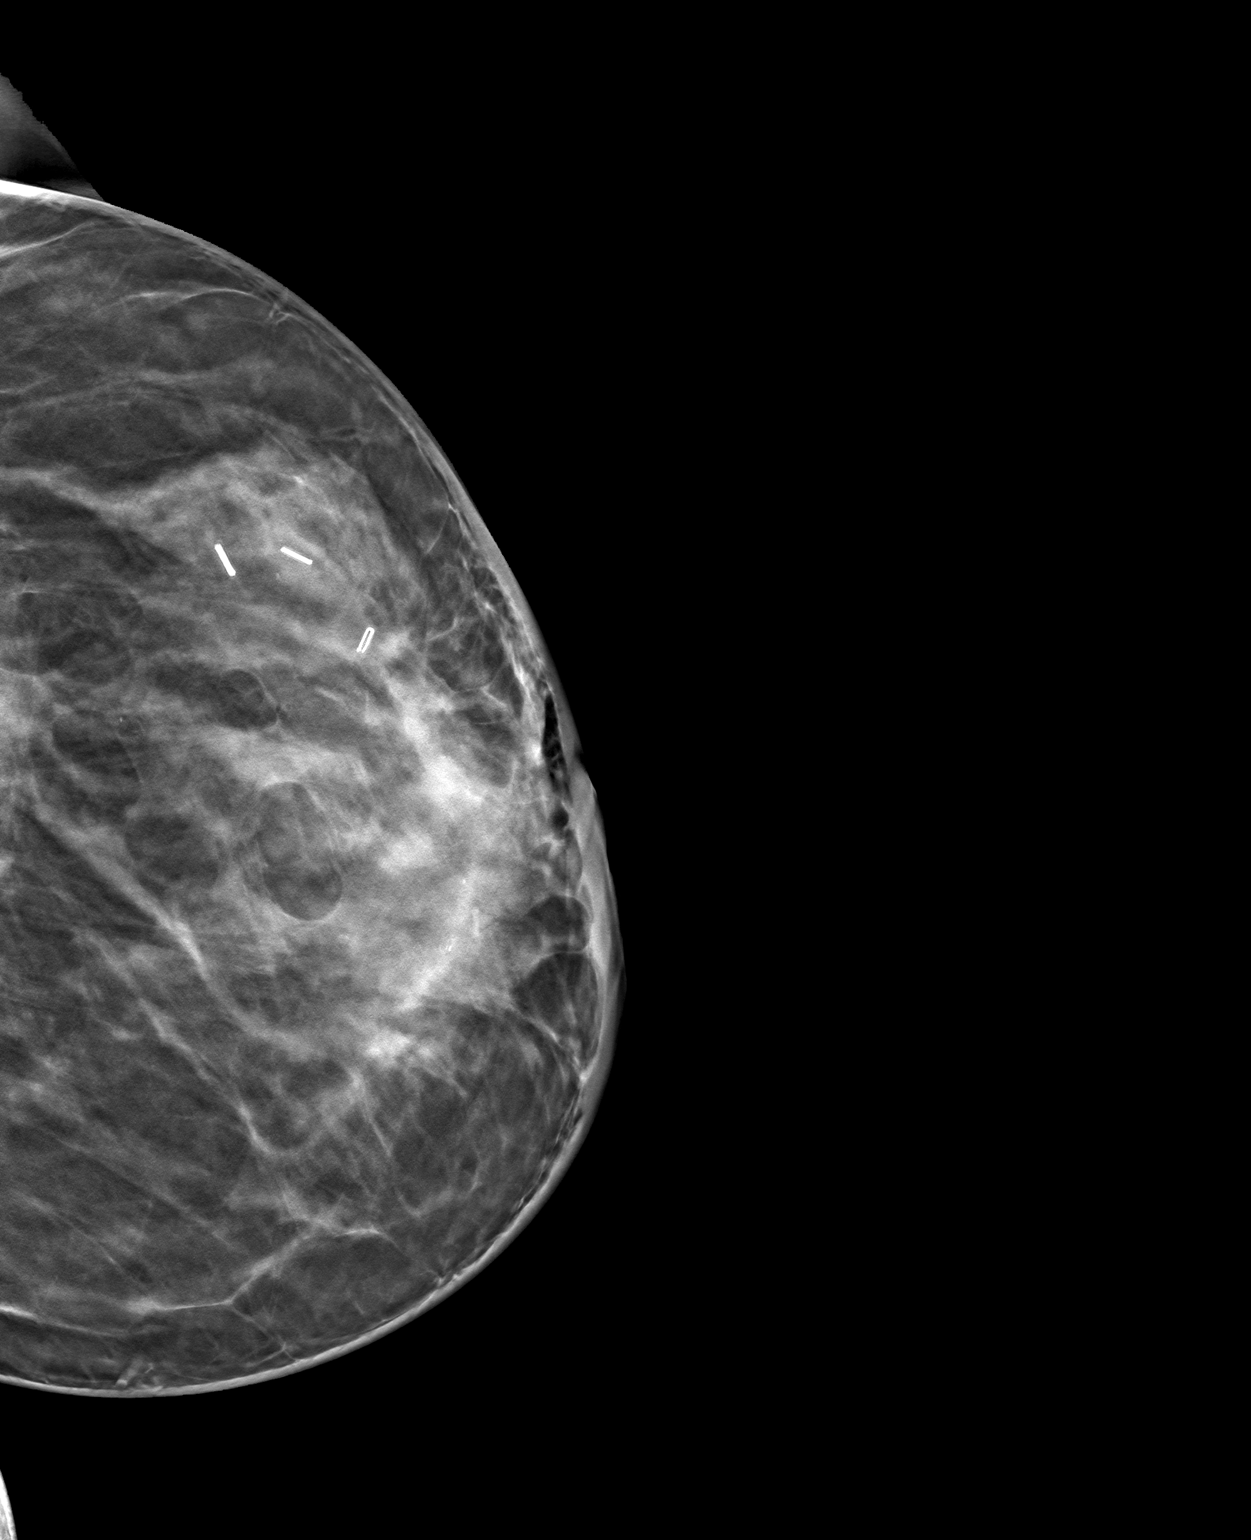
[frame 43/86]
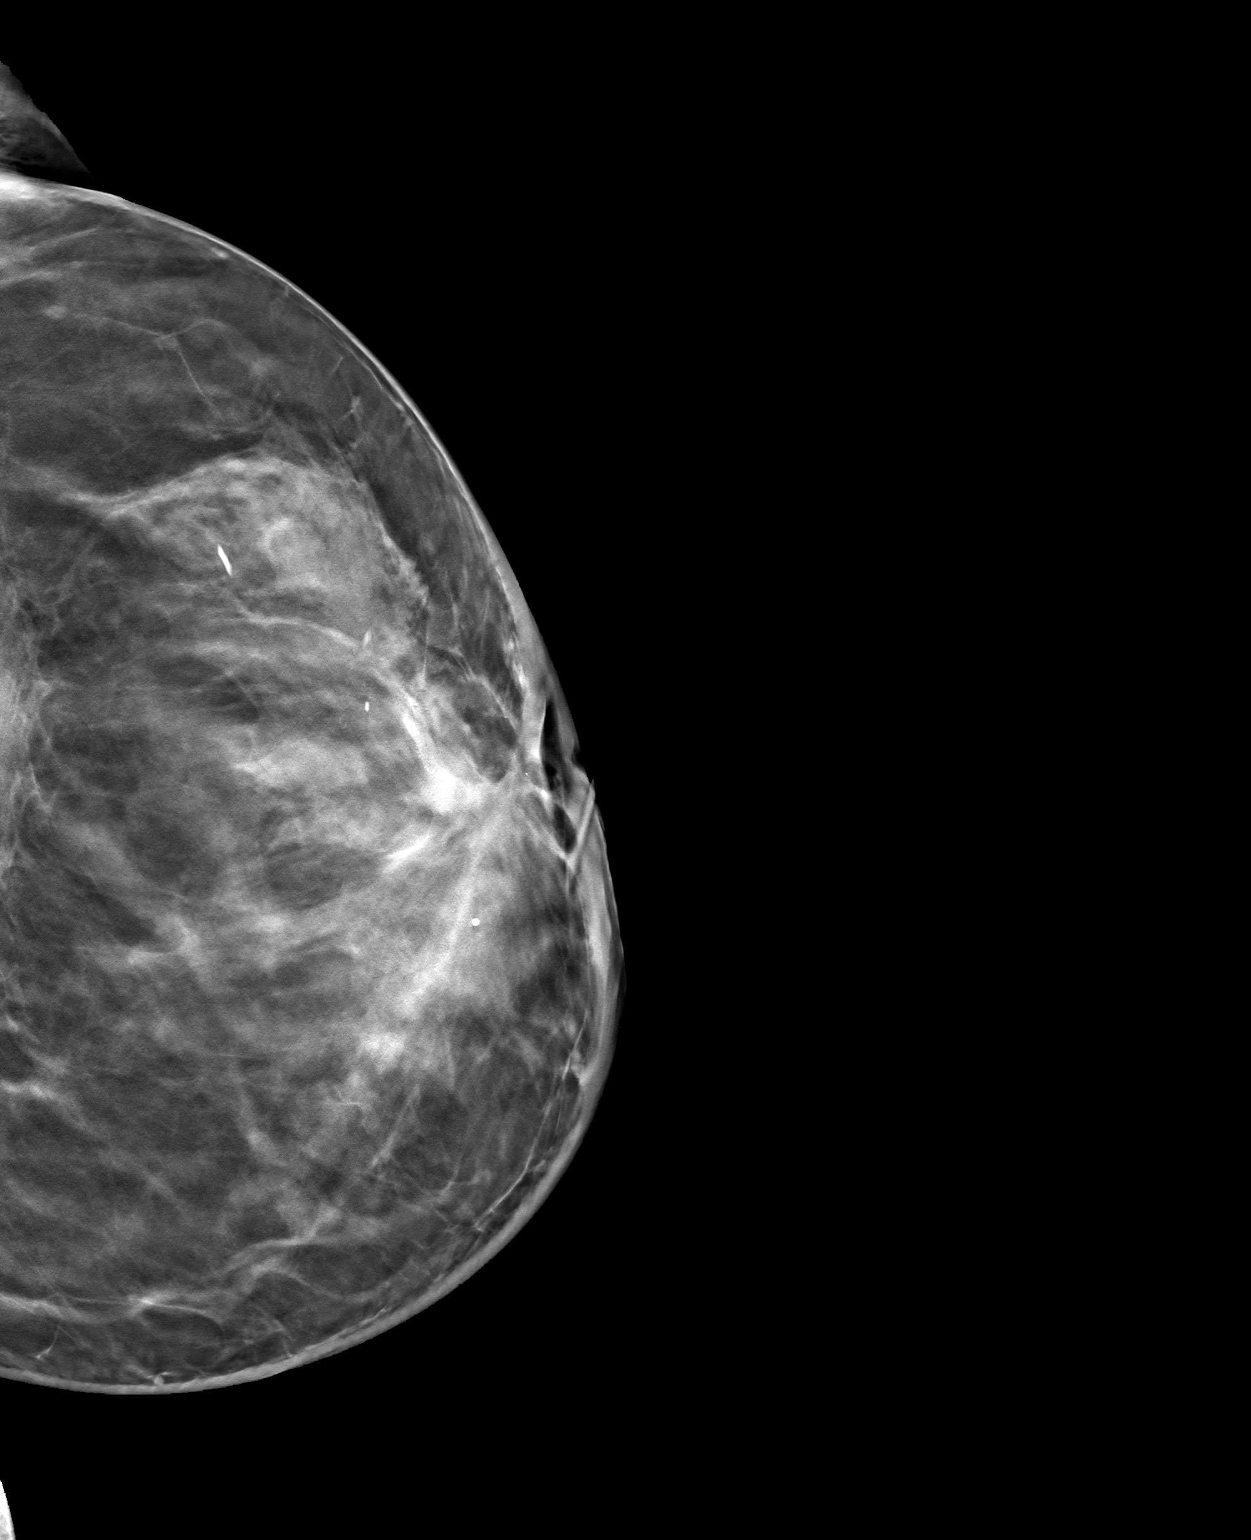

[3 of 6 positions shown; findings below may reference images not displayed]

ACR Breast Density Category c: The breast tissue is heterogeneously
dense, which may obscure small masses.
FINDINGS: Postoperative changes are seen in the left breast. No suspicious
masses, calcifications, distortion, or masses.

Mammographic images were processed with CAD.

On physical exam, no suspicious lumps are identified.

Targeted ultrasound is performed, showing postoperative changes with
no evidence of seroma.
IMPRESSION: Postoperative changes in the left breast. No evidence of malignancy
or seroma.

RECOMMENDATION:
Annual diagnostic mammography due in Friday December, 2019.

I have discussed the findings and recommendations with the patient.
If applicable, a reminder letter will be sent to the patient
regarding the next appointment.

BI-RADS CATEGORY  2: Benign.

## 2020-11-15 ENCOUNTER — Encounter: Payer: Self-pay | Admitting: Oncology

## 2020-11-18 ENCOUNTER — Other Ambulatory Visit: Payer: Self-pay | Admitting: Oncology

## 2020-11-18 ENCOUNTER — Encounter: Payer: Self-pay | Admitting: Oncology

## 2020-11-19 ENCOUNTER — Telehealth: Payer: Self-pay | Admitting: Adult Health

## 2020-11-19 NOTE — Telephone Encounter (Signed)
Scheduled appt per 7/26 sch msg. Pt aware.

## 2020-11-25 ENCOUNTER — Other Ambulatory Visit: Payer: Self-pay

## 2020-11-25 ENCOUNTER — Inpatient Hospital Stay: Payer: 59 | Attending: Adult Health | Admitting: Adult Health

## 2020-11-25 ENCOUNTER — Encounter: Payer: Self-pay | Admitting: Adult Health

## 2020-11-25 ENCOUNTER — Ambulatory Visit: Payer: 59 | Admitting: Adult Health

## 2020-11-25 ENCOUNTER — Other Ambulatory Visit: Payer: Self-pay | Admitting: Family Medicine

## 2020-11-25 ENCOUNTER — Other Ambulatory Visit (HOSPITAL_COMMUNITY): Payer: Self-pay

## 2020-11-25 VITALS — BP 130/58 | HR 84 | Temp 98.1°F | Resp 18 | Ht 63.0 in | Wt 149.7 lb

## 2020-11-25 DIAGNOSIS — D0512 Intraductal carcinoma in situ of left breast: Secondary | ICD-10-CM | POA: Insufficient documentation

## 2020-11-25 DIAGNOSIS — N939 Abnormal uterine and vaginal bleeding, unspecified: Secondary | ICD-10-CM | POA: Insufficient documentation

## 2020-11-25 DIAGNOSIS — F411 Generalized anxiety disorder: Secondary | ICD-10-CM

## 2020-11-25 DIAGNOSIS — M810 Age-related osteoporosis without current pathological fracture: Secondary | ICD-10-CM

## 2020-11-25 MED ORDER — SERTRALINE HCL 100 MG PO TABS
100.0000 mg | ORAL_TABLET | Freq: Every day | ORAL | 1 refills | Status: DC
  Filled 2020-11-25: qty 90, 90d supply, fill #0

## 2020-11-25 MED ORDER — ALENDRONATE SODIUM 70 MG PO TABS
ORAL_TABLET | ORAL | 3 refills | Status: AC
  Filled 2020-11-25: qty 12, 84d supply, fill #0
  Filled 2021-04-17: qty 12, 84d supply, fill #1
  Filled 2021-06-12 – 2021-06-23 (×2): qty 12, 84d supply, fill #2
  Filled 2021-10-22: qty 12, 84d supply, fill #0

## 2020-11-25 NOTE — Progress Notes (Signed)
[place]  [phone] [phone]) [phone]     ID: Stephanie [name] DOB: 1965/10/23  MR#: [id number]  NTZ#:[id number]  Patient Care Team: [name], MD as PCP - General (Family Medicine) Kennith Center, RD as Dietitian (Family Medicine) Donnamae Jude, MD as Consulting Physician (Obstetrics and Gynecology) Magrinat, Virgie Dad, MD as Consulting Physician (Oncology) [name], MD as Consulting Physician (General Surgery) Eppie Gibson, MD as Attending Physician (Radiation Oncology) Scot Dock, NP OTHER MD:  CHIEF COMPLAINT: Ductal carcinoma in situ  CURRENT TREATMENT: tamoxifen (on hold currently)   INTERVAL HISTORY: Stephanie "Stephanie [name]" returns today for follow up of her noninvasive breast cancer.  Her most recent bilateral diagnostic mammogram with CAD and TOMO was conducted on 01/19/2020 that showed no evidence of breast malignancy and breast density category C.  She underwent bone density testing on 01/05/2020 that showed osteopenia with a T score of -2.3 in her L1-L4 spine.    Stephanie [name] developed vaginal bleeding on 10/19/2020 x 1 day.  She had another episode of spotting on 11/16/2020.  Mid July she underwent a transvaginal ultrasound with Dr. Kennon Rounds to further evaluate the bleeding.  She was noted to have thickening of the lining of the uterus approximately 35mm, and she is scheduled for a hysteroscopy and biopsy on 11/27/2020.  She called our office on 7/26 and was instructed to stop the Tamoxifen and Estring and she did so.  Last week she stopped the Gabapentin.  Stephanie [name] wants to increase her Zoloft because she is very worried.  Her stituational anxiety has worsened and she received incrased stressors with her parents, her finances, and now her health.     REVIEW OF SYSTEMS: Review of Systems  Constitutional:  Negative for appetite change, chills, fatigue, fever and unexpected weight change.  HENT:   Negative for hearing loss, lump/mass and trouble  swallowing.   Eyes:  Negative for eye problems and icterus.  Respiratory:  Negative for chest tightness, cough and shortness of breath.   Cardiovascular:  Negative for chest pain, leg swelling and palpitations.  Gastrointestinal:  Negative for abdominal distention, abdominal pain, constipation, diarrhea, nausea and vomiting.  Endocrine: Negative for hot flashes.  Genitourinary:  Positive for vaginal bleeding. Negative for difficulty urinating, pelvic pain and vaginal discharge.   Musculoskeletal:  Negative for arthralgias.  Skin:  Negative for itching and rash.  Neurological:  Negative for dizziness, extremity weakness, headaches and numbness.  Hematological:  Negative for adenopathy. Does not bruise/bleed easily.  Psychiatric/Behavioral:  Negative for depression. The patient is nervous/anxious.     HISTORY OF CURRENT ILLNESS: From the original intake note:  Stephanie [name] has a history of left breast lumpectomy ([id number]) performed on 08/15/2015 for fibroscystic changes with calcifications. She underwent a second left breast lumpectomy on 12/30/[phone]) for lobular neoplasia, complex sclerosing lesion, and fibrocystic change and sclerosing adenosis with calcifications.  She had routine screening mammography on 12/14/2017 showing a possible abnormality in the left breast. She underwent left diagnostic mammogram on 01/27/2018, which showed: breast density category C; probably benign left breast calcifications, most likely fat necrosis (measurements not given). Short term follow up was recommended.  She returned for follow up on 09/29/2018 (likely delayed due to the pandemic) and underwent diagnostic left mammogram. This showed: breast density category C; unchanged 1.5 cm group of calcifications at the lumpectomy site; 1.2 cm group of calcifications within the anterior upper-outer left breast. Short term follow up was again recommended.  She bilateral diagnostic  mammography with tomography  at [facility] on 01/03/2019 showing: breast density category C; stable probably benign calcifications at the left lumpectomy site; 1.5 cm grouped calcifications within the upper-outer quadrant of the left breast with suspicious morphology and distribution; no evidence of right breast malignancy.   Accordingly on 01/06/2019 she proceeded to biopsy of the left breast area in question. The pathology from this procedure ([id number]) showed: ductal carcinoma in situ, high grade. Prognostic indicators significant for: estrogen receptor, 100% positive and progesterone receptor, 80% positive, both with strong staining intensity.   She underwent genetic counseling on 01/16/2019, which showed negative results.  She also underwent biopsy of the calcifications at the left lumpectomy site on 01/17/2019. Pathology [id number]) showed: focal fibrocystic changes; dense fibrosis with pigmented histiocytes and foreign body giant cells.  She opted to proceed with left breast lumpectomy on 02/09/2019 under Dr. Donne Hazel. Pathology from the procedure ([id number]) showed:  1. Left Breast, lumpectomy  - fibrocystic changes 2. Left Breast, lumpectomy  - ductal carcinoma in situ with calcifications, intermediate grade, spanning 2.4 cm  - lobular neoplasia  -negative resection margins   The patient's subsequent history is as detailed below.   PAST MEDICAL HISTORY: Past Medical History:  Diagnosis Date   Anxiety    Breast mass, left    Cancer ([place])    Complication of anesthesia    felt lethargic x 4 days following last surgery in 2017, but 2018 did okay   Depression    Headache    migraines   History of radiation therapy 03/13/19- 04/11/19   Left Breast 16 fraction of 2. 66 Gy each to total 42.56 Gy. Left breast boost 4 fractions of 2 Gy to total 8 Gy.    Osteopenia     PAST SURGICAL HISTORY: Past Surgical History:  Procedure Laterality Date   BREAST BIOPSY Left 07/10/2015   high risk stereo    BREAST EXCISIONAL BIOPSY Left 08/15/2015   ADH   BREAST LUMPECTOMY WITH RADIOACTIVE SEED LOCALIZATION Left 08/15/2015   Procedure:  LEFT BREAST LUMPECTOMY WITH RADIOACTIVE SEED LOCALIZATION;  Surgeon: Erroll Luna, MD;  Location: [facility];  Service: General;  Laterality: Left;   BREAST LUMPECTOMY WITH RADIOACTIVE SEED LOCALIZATION Left 02/09/2019   Procedure: LEFT BREAST LUMPECTOMY WITH RADIOACTIVE SEED LOCALIZATION;  Surgeon: [name], MD;  Location: [facility];  Service: General;  Laterality: Left;   EXCISION OF BREAST BIOPSY Left 12/30/2016   Benign ALH sclerosing lesion   RADIOACTIVE SEED GUIDED EXCISIONAL BREAST BIOPSY Left 12/30/2016   Procedure: RADIOACTIVE SEED GUIDED EXCISIONAL LEFT BREAST BIOPSY;  Surgeon: [name], MD;  Location: [facility];  Service: General;  Laterality: Left;   RADIOACTIVE SEED GUIDED EXCISIONAL BREAST BIOPSY Left 02/09/2019   Procedure: RADIOACTIVE SEED GUIDED EXCISIONAL LEFT  BREAST BIOPSY;  Surgeon: [name], MD;  Location: [facility];  Service: General;  Laterality: Left;   TONSILLECTOMY  Age 76   TRIGGER FINGER RELEASE  2010   Right Thumb    FAMILY HISTORY: Family History  Problem Relation Age of Onset   Depression Brother    Atrial fibrillation Brother    Hypertension Mother    Osteoporosis Mother    ADD / ADHD Son    ADD / ADHD Daughter    Diabetes Maternal Grandmother    Diabetes Maternal Grandfather    Diabetes Paternal Grandmother    Diabetes Paternal Grandfather    Parkinson's disease Maternal Aunt    Patient's father was 63 years old when  he died from complications of cardiomyopathy.  He was adopted and there is no family history on his side. Patient's mother is 98 years old as of November 2020. The patient has 3 brothers, no sisters.  She denies/or notes a family hx of breast, prostate, pancreatic or ovarian cancer.    GYNECOLOGIC HISTORY:  Patient's last menstrual period was 01/25/2014  (approximate). Menarche: 55 years old Age at first live birth: Any 55 years old GX P 2 LMP 49 Contraceptive oral contraceptives at least 7 years, with no complications HRT 9 months, discontinued because of malaise  Hysterectomy? no BSO?  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  Her husband Nicole Kindred works in Radio producer..  Daughter Apolonio Schneiders, 23, lives in [place] and works as a Immunologist.  Son [place], [place], with Asperger's, currently attends [employer], and lives at home with the patient.     ADVANCED DIRECTIVES: In the absence of any documents to the contrary the patient's husband is her healthcare power of attorney   HEALTH MAINTENANCE: Social History   Tobacco Use   Smoking status: Never   Smokeless tobacco: Never  Vaping Use   Vaping Use: Never used  Substance Use Topics   Alcohol use: No    Comment: rare use   Drug use: No     Colonoscopy: not on file  PAP: 08/2017, negative  Bone density: 11/2017, -2.0   Allergies  Allergen Reactions   Wellbutrin Xl [Bupropion] Hives    Current Outpatient Medications  Medication Sig Dispense Refill   alendronate (FOSAMAX) 70 MG tablet TAKE 1 TABLET BY MOUTH ONCE A WEEK. TAKE WITH A FULL GLASS OF WATER ON AN EMPTY STOMACH 12 tablet 3   Biotin w/ Vitamins C & E (HAIR/SKIN/NAILS PO) Take 1 tablet by mouth daily.      Calcium Carb-Cholecalciferol (CALCIUM + D3 PO) Take 1 tablet by mouth daily.      calcium carbonate (OS-CAL) 1250 (500 Ca) MG chewable tablet Chew 1 tablet by mouth daily.     clobetasol (TEMOVATE) 0.05 % GEL Apply 1 Dose topically 2 (two) times daily. 2x/day x 3-4 wks, then 1x/day x 4 wks, then every other day x  4 wks, then 2x/wk 60 g 3   estradiol (ESTRING) 2 MG vaginal ring PLACE 1 RING VAGINALLY EVERY 3 MONTHS, FOLLOW PACKAGE DIRECTIONS 1 each 12   gabapentin  (NEURONTIN) 300 MG capsule TAKE 1 CAPSULE BY MOUTH AT BEDTIME 90 capsule 4   predniSONE (DELTASONE) 20 MG tablet Take 2 tablets (40 mg total) by mouth daily. 10 tablet 0   scopolamine (TRANSDERM-SCOP, 1.5 MG,) 1 MG/3DAYS Place 1 patch (1.5 mg total) onto the skin every 3 (three) days. 4 patch 2   sertraline (ZOLOFT) 50 MG tablet TAKE 1 TABLET BY MOUTH ONCE DAILY 90 tablet 1   tamoxifen (NOLVADEX) 20 MG tablet TAKE 1 TABLET BY MOUTH ONCE DAILY 90 tablet 12   TURMERIC PO Take by mouth.     No current facility-administered medications for this visit.    OBJECTIVE:  Vitals:   11/25/20 1446  BP: (!) 130/58  Pulse: 84  Resp: 18  Temp: 98.1 F (36.7 C)  SpO2: 99%     Body mass index is 26.52 kg/m.   Wt Readings from Last 3 Encounters:  11/25/20 149 lb 11.2 oz (  67.9 kg)  01/24/20 148 lb 9.6 oz (67.4 kg)  12/04/19 149 lb (67.6 kg)      ECOG FS:1 GENERAL: Patient is a well appearing female in no acute distress EXTREMITIES:  No peripheral edema.   SKIN:  Clear with no obvious rashes or skin changes. No nail dyscrasia. NEURO:  Nonfocal. Well oriented.  Appropriate affect.     LAB RESULTS:  CMP     Component Value Date/Time   NA 140 01/24/2020 1033   NA 142 10/24/2019 1116   K 4.4 01/24/2020 1033   CL 103 01/24/2020 1033   CO2 30 01/24/2020 1033   GLUCOSE 86 01/24/2020 1033   BUN 17 01/24/2020 1033   BUN 11 10/24/2019 1116   CREATININE 0.73 01/24/2020 1033   CALCIUM 9.5 01/24/2020 1033   PROT 7.0 01/24/2020 1033   PROT 7.1 10/24/2019 1116   ALBUMIN 3.7 01/24/2020 1033   ALBUMIN 4.6 10/24/2019 1116   AST 28 01/24/2020 1033   ALT 15 01/24/2020 1033   ALKPHOS 59 01/24/2020 1033   BILITOT 0.4 01/24/2020 1033   GFRNONAA >60 01/24/2020 1033   GFRAA >60 01/24/2020 1033    No results found for: TOTALPROTELP, ALBUMINELP, A1GS, A2GS, BETS, BETA2SER, GAMS, MSPIKE, SPEI  No results found for: KPAFRELGTCHN, LAMBDASER, KAPLAMBRATIO  Lab Results  Component Value Date   WBC  6.1 01/24/2020   NEUTROABS 4.0 01/24/2020   HGB 12.5 01/24/2020   HCT 36.5 01/24/2020   MCV 89.5 01/24/2020   PLT 306 01/24/2020   No results found for: LABCA2  No components found for: WIOMBT597  No results for input(s): INR in the last 168 hours.  No results found for: LABCA2  No results found for: CBU384  No results found for: TXM468  No results found for: EHO122  No results found for: CA2729  No components found for: HGQUANT  No results found for: CEA1 / No results found for: CEA1   No results found for: AFPTUMOR  No results found for: CHROMOGRNA  No results found for: HGBA, HGBA2QUANT, HGBFQUANT, HGBSQUAN (Hemoglobinopathy evaluation)   No results found for: LDH  No results found for: IRON, TIBC, IRONPCTSAT (Iron and TIBC)  No results found for: FERRITIN  Urinalysis    Component Value Date/Time   LABSPEC 1.025 09/09/2009 1037   PHURINE 6.0 09/09/2009 1037   GLUCOSEU NEGATIVE 09/09/2009 1037   HGBUR TRACE (A) 09/09/2009 1037   BILIRUBINUR NEGATIVE 09/09/2009 [place] 09/09/2009 1037   PROTEINUR NEGATIVE 09/09/2009 1037   UROBILINOGEN 0.2 09/09/2009 1037   NITRITE NEGATIVE 09/09/2009 1037   LEUKOCYTESUR  09/09/2009 1037    NEGATIVE Biochemical Testing Only. Please order routine urinalysis from main lab if confirmatory testing is needed.    STUDIES: US PELVIC COMPLETE WITH TRANSVAGINAL  Result Date: 11/06/2020 CLINICAL DATA:  Postmenopausal bleeding EXAM: TRANSABDOMINAL AND TRANSVAGINAL ULTRASOUND OF PELVIS TECHNIQUE: Both transabdominal and transvaginal ultrasound examinations of the pelvis were performed. Transabdominal technique was performed for global imaging of the pelvis including uterus, ovaries, adnexal regions, and pelvic cul-de-sac. It was necessary to proceed with endovaginal exam following the transabdominal exam to visualize the ovaries. COMPARISON:  None FINDINGS: Uterus Measurements: 7.4 x 4.6 x 4.4 cm = volume: 78 mL.  Anteverted. Heterogeneous myometrium. No focal mass. Endometrium Thickness: 15 mm. Thickened, heterogeneous, containing tiny areas of cystic change. No discrete mass. Right ovary Measurements: 3.2 x 1.7 x 1.8 cm = volume: 5.2 mL. Normal morphology without mass Left ovary Not visualized, likely obscured by  bowel Other findings No free pelvic fluid.  No adnexal masses. IMPRESSION: Unremarkable uterus and RIGHT ovary with nonvisualization of LEFT ovary. Thickened and heterogeneous endometrial complex 15 mm thick; in the setting of post-menopausal bleeding, endometrial sampling is indicated to exclude carcinoma. If results are benign, sonohysterogram should be considered for focal lesion work-up. (Ref: Radiological Reasoning: Algorithmic Workup of Abnormal Vaginal Bleeding with Endovaginal Sonography and Sonohysterography. AJR 2008; 366:Y40-34) These results will be called to the ordering clinician or representative by the Radiologist Assistant, and communication documented in the PACS or [employer]. Electronically Signed   By: Lavonia Dana M.D.   On: 11/06/2020 10:23      ELIGIBLE FOR AVAILABLE RESEARCH PROTOCOL: no  ASSESSMENT: 55 y.o. [place] woman status post left breast biopsy 01/06/2019 ductal carcinoma in situ, grade 3, estrogen and progesterone receptor positive  (a) biopsy of a second area of calcifications in the left breast on 01/17/2019 -  (1) status post left lumpectomy 02/09/2019 2.4 cm ductal carcinoma in situ, grade 2, with negative margins  (2) adjuvant radiation 03/13/2019 - 04/11/2019  (a) left breast / 42.56 Gy in 16 fractions  (b) boost / 8 Gy in 4 fractions  (3) tamoxifen started 04/28/2019  (4) genetics testing 01/19/2019 through the [facility] Breast Cancer STAT Panel + Common Hereditary Cancers Panel found no deleterious mutations in ATM, BRCA1, BRCA2, CDH1, CHEK2, PALB2, PTEN, STK11 and TP53 // APC, ATM, AXIN2, BARD1, BMPR1A, BRCA1, BRCA2, BRIP1, CDH1, CDKN2A (p14ARF),  CDKN2A (p16INK4a), CKD4, CHEK2, CTNNA1, DICER1, EPCAM (Deletion/duplication testing only), GREM1 (promoter region deletion/duplication testing only), KIT, MEN1, MLH1, MSH2, MSH3, MSH6, MUTYH, NBN, NF1, NHTL1, PALB2, PDGFRA, PMS2, POLD1, POLE, PTEN, RAD50, RAD51C, RAD51D, SDHB, SDHC, SDHD, SMAD4, SMARCA4. STK11, TP53, TSC1, TSC2, and VHL.  The following genes were evaluated for sequence changes only: SDHA and HOXB13 c.251G>A variant only.   PLAN: Viveka is here today for follow up and discussion of her vaginal bleeding while on Tamoxifen.  She has been holding the Tamoxifen since 11/19/2020.  She is very motivated to continue on Tamoxifen once she undergoes the hysteroscopy.  She would really rather have a hysterectomy and continue therapy.    I reivewed with her the [facility] DCIS risk nomogram.  Her risk of recurrence with antiestrogen therapy in 5 years is 2% and in 10 years 3%; without antiestrogen therapy her risk is 4% and 6%.  I initially recommended that she stay off of tamoxifen until her f/u with Dr. Jana Hakim.  After our discussion, we will await on her hysteroscopy results that should be back at the end of this week, to the beginning of next week.  Once we have those we will discuss her next steps.    She does not want to take aromatase inhibitors and is very interested in hysterectomy so she can continue tamoxifen.  Once we have her results we will further discuss recommendations.    Stephanie [name] is scheduled to see Dr. Jana Hakim in 12/2020.  We will be touching base with her in the interim.  She knows to call for any questions that may arise between now and her next appointment.  We are happy to see her sooner if needed.  Total encounter time: 30 minutes in face to face visit time, chart review, and documentation of the encounter.   Wilber Bihari, NP 11/25/20 2:49 PM Medical Oncology and Hematology [facility] [place], [place] [address] Tel. [phone]    Fax.  [phone]   *Total Encounter Time  as defined by the Centers for Medicare and Medicaid Services includes, in addition to the face-to-face time of a patient visit (documented in the note above) non-face-to-face time: obtaining and reviewing outside history, ordering and reviewing medications, tests or procedures, care coordination (communications with other health care professionals or caregivers) and documentation in the medical record.

## 2020-11-27 ENCOUNTER — Encounter: Payer: Self-pay | Admitting: Obstetrics & Gynecology

## 2020-11-27 ENCOUNTER — Other Ambulatory Visit: Payer: 59 | Admitting: Family Medicine

## 2020-11-27 ENCOUNTER — Encounter: Payer: Self-pay | Admitting: General Practice

## 2020-11-27 ENCOUNTER — Ambulatory Visit: Payer: 59 | Admitting: Obstetrics & Gynecology

## 2020-11-27 ENCOUNTER — Other Ambulatory Visit (HOSPITAL_COMMUNITY)
Admission: RE | Admit: 2020-11-27 | Discharge: 2020-11-27 | Disposition: A | Discharge status: Home / Self Care | Payer: 59 | Source: Ambulatory Visit | Attending: Obstetrics & Gynecology | Admitting: Obstetrics & Gynecology

## 2020-11-27 ENCOUNTER — Other Ambulatory Visit: Payer: Self-pay

## 2020-11-27 VITALS — BP 114/58 | HR 83 | Ht 63.0 in | Wt 143.0 lb

## 2020-11-27 DIAGNOSIS — N950 Postmenopausal bleeding: Secondary | ICD-10-CM | POA: Diagnosis not present

## 2020-11-27 DIAGNOSIS — N858 Other specified noninflammatory disorders of uterus: Secondary | ICD-10-CM | POA: Diagnosis not present

## 2020-11-27 NOTE — Progress Notes (Signed)
Patient has thickened endometrium and break through bleeding. Kathrene Alu RN

## 2020-11-28 ENCOUNTER — Encounter: Payer: Self-pay | Admitting: Adult Health

## 2020-12-02 LAB — SURGICAL PATHOLOGY

## 2020-12-03 NOTE — Progress Notes (Signed)
INDICATIONS: 55 y.o. [id number]  here for scheduled surgery for PMPB while on Tamoxifen. Risks of surgery were discussed with the patient including but not limited to: bleeding which may require transfusion; infection which may require antibiotics; injury to uterus or surrounding organs; intrauterine scarring which may impair future fertility; need for additional procedures including laparotomy or laparoscopy; and other postoperative/anesthesia complications. Written informed consent was obtained.    FINDINGS:  A 8 week size uterus.  Diffuse proliferative endometrium.  Normal ostia bilaterally.  ANESTHESIA:   General, paracervical block. FLUID DEFICITS:  <50m of Normal saline ESTIMATED BLOOD LOSS:  Less than 20 ml SPECIMENS: Endometrial curettings sent to pathology COMPLICATIONS:  None immediate.  PROCEDURE DETAILS:  The patient was taken to the procedure room where she received Toradol 10 mg IM. She also took Valium '10mg'$  in the waiting area just prior to the procedure and cytotec 4053m ~ hours prior to the visit.  After an adequate timeout was performed, she was placed in the dorsal lithotomy position and examined; then prepped and draped in the sterile manner.   A speculum was then placed in the patient's vagina.  A paracervical block of 10cc of 2% lidocaine with epinephrine was placed with 10cc at both 5 and 7 o'clock.  A single tooth tenaculum was applied to the anterior lip of the cervix.   A 21m73mysteroscope was inserted under direct visualization using normal saline as a distending medium.  The uterine cavity was carefully examined, both ostia were recognized, and diffusely proliferative endometrium was noted.  After further careful visualization of the uterine cavity, the hysteroscope was removed under direct visualization.  A sharp curettage was then performed to obtain a moderate amount of endometrial curettings.  The hysteroscope was reinserted  After further careful visualization of the uterine  cavity, the hysteroscope was removed under direct visualization.  There were no masses noted.  The tenaculum was removed from the anterior lip of the cervix and the vaginal speculum was removed after noting good hemostasis.  The patient tolerated the procedure well.   There were no immediate complications.  The patient will be discharged to home. Routine postoperative instructions given.   We will f/u in 1 week via MyChart or telephone.  F/u sooner prn   Aprille Sawhney L. Harraway-Smith, M.D., FACCherlynn June

## 2020-12-05 ENCOUNTER — Other Ambulatory Visit: Payer: Self-pay | Admitting: General Surgery

## 2020-12-05 DIAGNOSIS — Z09 Encounter for follow-up examination after completed treatment for conditions other than malignant neoplasm: Secondary | ICD-10-CM

## 2020-12-11 ENCOUNTER — Other Ambulatory Visit: Payer: Self-pay | Admitting: Adult Health

## 2020-12-11 DIAGNOSIS — Z853 Personal history of malignant neoplasm of breast: Secondary | ICD-10-CM

## 2020-12-18 ENCOUNTER — Other Ambulatory Visit: Payer: Self-pay | Admitting: Obstetrics & Gynecology

## 2020-12-18 DIAGNOSIS — N61 Mastitis without abscess: Secondary | ICD-10-CM

## 2020-12-18 MED ORDER — AMOXICILLIN-POT CLAVULANATE 875-125 MG PO TABS: 1.0000 | ORAL_TABLET | Freq: Two times a day (BID) | ORAL | 1 refills | Status: DC

## 2020-12-18 NOTE — Progress Notes (Signed)
Meds ordered this encounter  Medications   amoxicillin-clavulanate (AUGMENTIN) 875-125 MG tablet    Sig: Take 1 tablet by mouth 2 (two) times daily.    Dispense:  20 tablet    Refill:  1  Phone call: Patient reports unilateral breast pain suspects mastitis, requested antibiotic.

## 2020-12-19 ENCOUNTER — Encounter: Payer: Self-pay | Admitting: Oncology

## 2020-12-20 ENCOUNTER — Telehealth: Payer: 59 | Admitting: Nurse Practitioner

## 2020-12-20 DIAGNOSIS — N61 Mastitis without abscess: Secondary | ICD-10-CM | POA: Diagnosis not present

## 2020-12-21 ENCOUNTER — Other Ambulatory Visit (HOSPITAL_COMMUNITY): Payer: Self-pay

## 2020-12-21 MED ORDER — CLINDAMYCIN HCL 300 MG PO CAPS
300.0000 mg | ORAL_CAPSULE | Freq: Three times a day (TID) | ORAL | 0 refills | Status: DC
  Filled 2020-12-21: qty 30, 10d supply, fill #0

## 2020-12-21 NOTE — Progress Notes (Signed)
E Visit for Cellulitis  We are sorry that you are not feeling well. Here is how we plan to help!  Based on what you shared with me it looks like you have mastitis. Mastitis is infection of breast glands. Sounds like not responding to current treatment  I have prescribed:  Clindamycin 300 mg take one by mouth three times a day for 10 days.  If you are not improving you will need a face ti face visit for dhot or IV antibiotics.  HOME CARE:  Take your medications as ordered and take all of them, even if the skin irritation appears to be healing.  Cool compresses  GET HELP RIGHT AWAY IF:  Symptoms that don't begin to go away within 48 hours. Severe redness persists or worsens If the area turns color, spreads or swells. If it blisters and opens, develops yellow-brown crust or bleeds. You develop a fever or chills. If the pain increases or becomes unbearable.  Are unable to keep fluids and food down.  MAKE SURE YOU   Understand these instructions. Will watch your condition. Will get help right away if you are not doing well or get worse.  Thank you for choosing an e-visit.  Your e-visit answers were reviewed by a board certified advanced clinical practitioner to complete your personal care plan. Depending upon the condition, your plan could have included both over the counter or prescription medications.  Please review your pharmacy choice. Make sure the pharmacy is open so you can pick up prescription now. If there is a problem, you may contact your provider through [employer] and have the prescription routed to another pharmacy.  Your safety is important to [place]. If you have drug allergies check your prescription carefully.   For the next 24 hours you can use MyChart to ask questions about today's visit, request a non-urgent call back, or ask for a work or school excuse. You will get an email in the next two days asking about your experience. I hope that your e-visit has been  valuable and will speed your recovery.  5-10 minutes spent reviewing and documenting in chart.

## 2020-12-24 ENCOUNTER — Telehealth: Payer: Self-pay | Admitting: *Deleted

## 2020-12-24 ENCOUNTER — Encounter: Payer: Self-pay | Admitting: Oncology

## 2020-12-24 NOTE — Telephone Encounter (Signed)
This RN spoke with pt per her My Chart message stating onset of diarrhea with new antibiotic ordered thru her primary MD for breast cellulitis.  She states overall diarrhea is minimal - loose but not watery.  Presently she states overall improvement in the cellulitis.  Per phone discussion- she will use imodium and monitor - if worsens she will let us know.

## 2020-12-25 ENCOUNTER — Encounter: Payer: Self-pay | Admitting: Oncology

## 2020-12-31 ENCOUNTER — Other Ambulatory Visit: Payer: Self-pay

## 2020-12-31 ENCOUNTER — Telehealth: Payer: Self-pay | Admitting: *Deleted

## 2020-12-31 ENCOUNTER — Inpatient Hospital Stay: Payer: 59 | Attending: Adult Health | Admitting: Adult Health

## 2020-12-31 ENCOUNTER — Inpatient Hospital Stay: Payer: 59

## 2020-12-31 ENCOUNTER — Ambulatory Visit: Payer: 59 | Admitting: Obstetrics and Gynecology

## 2020-12-31 ENCOUNTER — Other Ambulatory Visit: Payer: Self-pay | Admitting: *Deleted

## 2020-12-31 ENCOUNTER — Encounter: Payer: Self-pay | Admitting: Oncology

## 2020-12-31 ENCOUNTER — Encounter: Payer: Self-pay | Admitting: Adult Health

## 2020-12-31 VITALS — BP 127/66 | HR 78 | Temp 97.9°F | Resp 18 | Ht 63.0 in | Wt 149.0 lb

## 2020-12-31 DIAGNOSIS — C50412 Malignant neoplasm of upper-outer quadrant of left female breast: Secondary | ICD-10-CM

## 2020-12-31 DIAGNOSIS — Z170 Estrogen receptor positive status [ER+]: Secondary | ICD-10-CM

## 2020-12-31 LAB — CMP (CANCER CENTER ONLY)
ALT: 33 U/L (ref 0–44)
AST: 47 U/L — ABNORMAL HIGH (ref 15–41)
Albumin: 3.7 g/dL (ref 3.5–5.0)
Alkaline Phosphatase: 73 U/L (ref 38–126)
Anion gap: 13 (ref 5–15)
BUN: 12 mg/dL (ref 6–20)
CO2: 28 mmol/L (ref 22–32)
Calcium: 10.2 mg/dL (ref 8.9–10.3)
Chloride: 103 mmol/L (ref 98–111)
Creatinine: 0.8 mg/dL (ref 0.44–1.00)
GFR, Estimated: >60 mL/min (ref >60)
Glucose, Bld: 89 mg/dL (ref 70–99)
Potassium: 4.4 mmol/L (ref 3.5–5.1)
Sodium: 144 mmol/L (ref 135–145)
Total Bilirubin: 0.3 mg/dL (ref 0.3–1.2)
Total Protein: 6.9 g/dL (ref 6.5–8.1)

## 2020-12-31 LAB — CBC WITH DIFFERENTIAL (CANCER CENTER ONLY)
Abs Immature Granulocytes: 0.02 10*3/uL (ref 0.00–0.07)
Basophils Absolute: 0.1 10*3/uL (ref 0.0–0.1)
Basophils Relative: 1 %
Eosinophils Absolute: 0.2 10*3/uL (ref 0.0–0.5)
Eosinophils Relative: 3 %
HCT: 35.8 % — ABNORMAL LOW (ref 36.0–46.0)
Hemoglobin: 12 g/dL (ref 12.0–15.0)
Immature Granulocytes: 0 %
Lymphocytes Relative: 27 %
Lymphs Abs: 1.9 10*3/uL (ref 0.7–4.0)
MCH: 30.5 pg (ref 26.0–34.0)
MCHC: 33.5 g/dL (ref 30.0–36.0)
MCV: 91.1 fL (ref 80.0–100.0)
Monocytes Absolute: 0.4 10*3/uL (ref 0.1–1.0)
Monocytes Relative: 6 %
Neutro Abs: 4.6 10*3/uL (ref 1.7–7.7)
Neutrophils Relative %: 63 %
Platelet Count: 400 10*3/uL (ref 150–400)
RBC: 3.93 MIL/uL (ref 3.87–5.11)
RDW: 11.6 % (ref 11.5–15.5)
WBC Count: 7.2 10*3/uL (ref 4.0–10.5)
nRBC: 0 % (ref 0.0–0.2)

## 2020-12-31 NOTE — Progress Notes (Signed)
Stephanie [name] Follow up:    [name], MD [place] [address]   DIAGNOSIS: [name] Staging Malignant neoplasm of upper-outer quadrant of left breast in female, estrogen receptor positive ([place]) Staging form: Breast, AJCC 8th Edition - Clinical stage from 01/06/2019: Stage 0 (cTis (DCIS), cN0, cM0, ER+, PR+) - Signed by Gardenia Phlegm, NP on 01/18/2019 Stage prefix: Initial diagnosis - Pathologic stage from 02/09/2019: Stage 0 (pTis (DCIS), pN0, cM0, ER+, PR+) - Signed by Gardenia Phlegm, NP on 03/01/2019 Stage prefix: Initial diagnosis   SUMMARY OF ONCOLOGIC HISTORY:  [name] status post left breast biopsy 01/06/2019 ductal carcinoma in situ, grade 3, estrogen and progesterone receptor positive             (a) biopsy of a second area of calcifications in the left breast on 01/17/2019 -   (1) status post left lumpectomy 02/09/2019 2.4 cm ductal carcinoma in situ, grade 2, with negative margins   (2) adjuvant radiation 03/13/2019 - 04/11/2019             (a) left breast / 42.56 Gy in 16 fractions             (b) boost / 8 Gy in 4 fractions   (3) tamoxifen started 04/28/2019   (4) genetics testing 01/19/2019 through the [facility] [name] STAT Panel + Common Hereditary Cancers Panel found no deleterious mutations in ATM, BRCA1, BRCA2, CDH1, CHEK2, PALB2, PTEN, STK11 and TP53 // APC, ATM, AXIN2, BARD1, BMPR1A, BRCA1, BRCA2, BRIP1, CDH1, CDKN2A (p14ARF), CDKN2A (p16INK4a), CKD4, CHEK2, CTNNA1, DICER1, EPCAM (Deletion/duplication testing only), GREM1 (promoter region deletion/duplication testing only), KIT, MEN1, MLH1, MSH2, MSH3, MSH6, MUTYH, NBN, NF1, NHTL1, PALB2, PDGFRA, PMS2, POLD1, POLE, PTEN, RAD50, RAD51C, RAD51D, SDHB, SDHC, SDHD, SMAD4, SMARCA4. STK11, TP53, TSC1, TSC2, and VHL.  The following genes were evaluated for sequence changes only: SDHA and HOXB13 c.251G>A variant only.  CURRENT THERAPY:  tamoxifen  INTERVAL HISTORY: Stephanie [name] 55 y.o. female returns for urgent evaluation of breast cellulitis.  She was sent in amoxicillin on 12/18/2020 and there was no change. She notes her breast was bright red and it covered the entire breast.  She had an e visit on 8/27 and was sent in Clindamycin which she has taken TID since then.  Her last dose of Clindamycin is due tomorrow.  She notes that the swelling of her breast has persisted.  She says that it is very mildly achy about a 1/10.  The redness today has improved and is now covering only the inner line where she received her radiation.  She has no fever.     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  MEDICAL HISTORY: Past Medical History:  Diagnosis Date   Anxiety    Breast mass, left    [name] (HCC)    Complication of anesthesia    felt lethargic x 4 days following  last surgery in 2017, but 2018 did okay   Depression    Headache    migraines   History of radiation therapy 03/13/19- 04/11/19   Left Breast 16 fraction of 2. 66 Gy each to total 42.56 Gy. Left breast boost 4 fractions of 2 Gy to total 8 Gy.    Osteopenia     SURGICAL HISTORY: Past Surgical History:  Procedure Laterality Date   BREAST BIOPSY Left 07/10/2015   high risk stereo   BREAST EXCISIONAL BIOPSY Left 08/15/2015   ADH   BREAST LUMPECTOMY WITH  RADIOACTIVE SEED LOCALIZATION Left 08/15/2015   Procedure:  LEFT BREAST LUMPECTOMY WITH RADIOACTIVE SEED LOCALIZATION;  Surgeon: Erroll Luna, MD;  Location: [facility];  Service: General;  Laterality: Left;   BREAST LUMPECTOMY WITH RADIOACTIVE SEED LOCALIZATION Left 02/09/2019   Procedure: LEFT BREAST LUMPECTOMY WITH RADIOACTIVE SEED LOCALIZATION;  Surgeon: [name], MD;  Location: [facility];  Service: General;  Laterality: Left;   EXCISION OF BREAST BIOPSY Left 12/30/2016   Benign ALH sclerosing lesion   RADIOACTIVE SEED GUIDED EXCISIONAL BREAST BIOPSY Left 12/30/2016   Procedure: RADIOACTIVE SEED GUIDED EXCISIONAL LEFT BREAST BIOPSY;  Surgeon: [name], MD;  Location: [facility];  Service: General;  Laterality: Left;   RADIOACTIVE SEED GUIDED EXCISIONAL BREAST BIOPSY Left 02/09/2019   Procedure: RADIOACTIVE SEED GUIDED EXCISIONAL LEFT  BREAST BIOPSY;  Surgeon: [name], MD;  Location: [facility];  Service: General;  Laterality: Left;   TONSILLECTOMY  Age 3   TRIGGER FINGER RELEASE  2010   Right Thumb    SOCIAL HISTORY: Social History   Socioeconomic History   Marital status: Married    Spouse name: Not on file   Number of children: Not on file   Years of education: Not on file   Highest education level: Not on file  Occupational History   Occupation: RN    Employer: [employer]  Tobacco Use   Smoking status: Never   Smokeless tobacco: Never  Vaping Use   Vaping Use: Never used  Substance and Sexual Activity   Alcohol use: No    Comment: rare use   Drug use: No   Sexual activity: Yes    Partners: Male    Birth control/protection: Post-menopausal  Other Topics Concern   Not on file  Social History Narrative   04/05/12 AM he was born and grew up in [place]. She has 3 older brothers. She never suffered any abuse. Her father died when she was 25 years of age. She completed her bachelor of science in nursing  in 1989, and has been working as an Therapist, sports . She has been married for 21 years, and she and her husband have a 66 year old son and a 17 year old daughter. She affiliates as Psychologist, forensic. She denies any legal involvement. She enjoys hiking and reading. She reports her social support consists of friends from college, and a friend from church. 04/05/2012   Social Determinants of Health   Financial Resource Strain: Not on file  Food Insecurity: Not on file  Transportation Needs: Not on file  Physical Activity: Not on file  Stress: Not on file  Social Connections: Not on file  Intimate Partner Violence: Not on file    FAMILY HISTORY: Family History  Problem Relation Age of Onset   Depression Brother    Atrial fibrillation Brother    Hypertension Mother    Osteoporosis Mother    ADD / ADHD Son    ADD /  ADHD Daughter    Diabetes Maternal Grandmother    Diabetes Maternal Grandfather    Diabetes Paternal Grandmother    Diabetes Paternal Grandfather    Parkinson's disease Maternal Aunt     Review of Systems  Constitutional:  Positive for fatigue. Negative for appetite change, chills, fever and unexpected weight change.  HENT:   Negative for hearing loss, lump/mass and trouble swallowing.   Eyes:  Negative for eye problems and icterus.  Respiratory:  Negative for chest tightness, cough and shortness of breath.   Cardiovascular:  Negative for chest pain, leg swelling and palpitations.  Gastrointestinal:  Negative for abdominal distention, abdominal pain, constipation, diarrhea, nausea and vomiting.  Endocrine: Negative for hot flashes.  Genitourinary:  Negative for difficulty urinating.   Musculoskeletal:  Negative for arthralgias.  Skin:  Negative for itching and rash.  Neurological:  Negative for dizziness, extremity weakness, headaches and numbness.  Hematological:  Negative for adenopathy. Does not bruise/bleed easily.  Psychiatric/Behavioral:  Negative for depression. The patient is not  nervous/anxious.      PHYSICAL EXAMINATION  ECOG PERFORMANCE STATUS: 1 - Symptomatic but completely ambulatory  Vitals:   12/31/20 1337  BP: 127/66  Pulse: 78  Resp: 18  Temp: 97.9 F (36.6 C)  SpO2: 100%    Physical Exam Constitutional:      General: She is not in acute distress.    Appearance: Normal appearance. She is not toxic-appearing.  HENT:     Head: Normocephalic and atraumatic.  Eyes:     General: No scleral icterus. Cardiovascular:     Rate and Rhythm: Normal rate and regular rhythm.     Pulses: Normal pulses.     Heart sounds: Normal heart sounds.  Pulmonary:     Effort: Pulmonary effort is normal.     Breath sounds: Normal breath sounds.     Comments: Very faint erythema on left breast, mild swelling, no area of fluctuance or induration, no warmth noted Abdominal:     General: Abdomen is flat. Bowel sounds are normal. There is no distension.     Palpations: Abdomen is soft.     Tenderness: There is no abdominal tenderness.  Musculoskeletal:        General: No swelling.     Cervical back: Neck supple.  Lymphadenopathy:     Cervical: No cervical adenopathy.  Skin:    General: Skin is warm and dry.     Findings: No rash.  Neurological:     General: No focal deficit present.     Mental Status: She is alert.  Psychiatric:        Mood and Affect: Mood normal.        Behavior: Behavior normal.    LABORATORY DATA:  CBC    Component Value Date/Time   WBC 7.2 12/31/2020 1317   WBC 6.7 02/02/2019 1000   RBC 3.93 12/31/2020 1317   HGB 12.0 12/31/2020 1317   HGB 13.4 10/24/2019 1114   HCT 35.8 (L) 12/31/2020 1317   HCT 39.8 10/24/2019 1114   PLT 400 12/31/2020 1317   PLT 332 10/24/2019 1114   MCV 91.1 12/31/2020 1317   MCV 90 10/24/2019 1114   MCH 30.5 12/31/2020 1317   MCHC 33.5 12/31/2020 1317   RDW 11.6 12/31/2020 1317   RDW 11.8 10/24/2019 1114   LYMPHSABS 1.9 12/31/2020 1317   MONOABS 0.4 12/31/2020 1317   EOSABS 0.2 12/31/2020 1317    BASOSABS 0.1 12/31/2020 1317    CMP  Component Value Date/Time   NA 144 12/31/2020 1317   NA 142 10/24/2019 1116   K 4.4 12/31/2020 1317   CL 103 12/31/2020 1317   CO2 28 12/31/2020 1317   GLUCOSE 89 12/31/2020 1317   BUN 12 12/31/2020 1317   BUN 11 10/24/2019 1116   CREATININE 0.80 12/31/2020 1317   CALCIUM 10.2 12/31/2020 1317   PROT 6.9 12/31/2020 1317   PROT 7.1 10/24/2019 1116   ALBUMIN 3.7 12/31/2020 1317   ALBUMIN 4.6 10/24/2019 1116   AST 47 (H) 12/31/2020 1317   ALT 33 12/31/2020 1317   ALKPHOS 73 12/31/2020 1317   BILITOT 0.3 12/31/2020 1317   GFRNONAA >60 12/31/2020 1317   GFRAA >60 01/24/2020 1033          ASSESSMENT and THERAPY PLAN:   Malignant neoplasm of upper-outer quadrant of left breast in female, estrogen receptor positive ([place]) Stephanie [name] is doing well today.  She has no sign of breast [name] recurrence.  For her breast cellulitis, she will complete the clindamycin tomorrow.  Her breast has much improved, and it isn't uncommon for the faint erythema to take a little bit longer to completely fade.  I recommended that after she stops the clindamycin to monitor the breast closely and if she develops any worsening we can send in another round of antibiotics and consider imaging with ultrasound.  Stephanie [name] understands this.  She knows to call for any questions or concerns.     All questions were answered. The patient knows to call the clinic with any problems, questions or concerns. We can certainly see the patient much sooner if necessary.  Total encounter time: 20 minutes  Wilber Bihari, NP 12/31/20 4:09 PM Medical Oncology and Hematology [facility] [place], [place] [address] Tel. [phone]    Fax. [phone]  *Total Encounter Time as defined by the Centers for Medicare and Medicaid Services includes, in addition to the face-to-face time of a patient visit (documented in the note above) non-face-to-face time: obtaining and  reviewing outside history, ordering and reviewing medications, tests or procedures, care coordination (communications with other health care professionals or caregivers) and documentation in the medical record.

## 2020-12-31 NOTE — Telephone Encounter (Signed)
This RN spoke with pt per her My Chart message stating she is having continued symptoms with the cellulitis is her breast - and is inquiring if she needs further antibiotics.  This RN stated need for visit for evaluation for best outcome - especially since she was started on antibiotics per her primary MD and had to switch to another one due to symptoms not improving.  [name] states has not been physically evaluated by her primary MD - just prescribed antibiotics.  This RN stated concern for need for evaluation - and offered her an appt with [facility]/NP today.  [name] stated she would do what is best but she is charged not only her co-pay for a visit in this office but also a facility fee.  This RN discussed above with plan for pt to contact her primary MD for visit for evaluation and further recommendations.  She will call this RN back if needed.

## 2020-12-31 NOTE — Assessment & Plan Note (Signed)
Stephanie [name] is doing well today.  She has no sign of breast cancer recurrence.  For her breast cellulitis, she will complete the clindamycin tomorrow.  Her breast has much improved, and it isn't uncommon for the faint erythema to take a little bit longer to completely fade.  I recommended that after she stops the clindamycin to monitor the breast closely and if she develops any worsening we can send in another round of antibiotics and consider imaging with ultrasound.  Stephanie [name] understands this.  She knows to call for any questions or concerns.

## 2021-01-03 ENCOUNTER — Encounter: Payer: Self-pay | Admitting: Oncology

## 2021-01-03 ENCOUNTER — Encounter: Payer: Self-pay | Admitting: Adult Health

## 2021-01-03 ENCOUNTER — Other Ambulatory Visit (HOSPITAL_BASED_OUTPATIENT_CLINIC_OR_DEPARTMENT_OTHER): Payer: Self-pay

## 2021-01-03 ENCOUNTER — Telehealth: Payer: Self-pay | Admitting: *Deleted

## 2021-01-03 MED ORDER — CLINDAMYCIN HCL 300 MG PO CAPS
300.0000 mg | ORAL_CAPSULE | Freq: Three times a day (TID) | ORAL | 0 refills | Status: DC
  Filled 2021-01-03: qty 30, 10d supply, fill #0

## 2021-01-03 NOTE — Telephone Encounter (Signed)
This RN spoke with pt per her my chart message stating recurring symptoms with cellulitis in her breast and axilla.  She is running a low grade temp and " kind of feel lousy".  Per review with [facility]/NP renewed clindamycin prescription.  This RN will follow up next week per above and possible need for [place] for further evaluation.

## 2021-01-06 ENCOUNTER — Encounter: Payer: Self-pay | Admitting: Oncology

## 2021-01-07 ENCOUNTER — Other Ambulatory Visit: Payer: Self-pay | Admitting: *Deleted

## 2021-01-09 ENCOUNTER — Other Ambulatory Visit: Payer: Self-pay | Admitting: Adult Health

## 2021-01-09 DIAGNOSIS — C50412 Malignant neoplasm of upper-outer quadrant of left female breast: Secondary | ICD-10-CM

## 2021-01-09 MED ORDER — FLUCONAZOLE 150 MG PO TABS: 150.0000 mg | ORAL_TABLET | Freq: Every day | ORAL | 0 refills | Status: DC

## 2021-01-09 NOTE — Progress Notes (Signed)
See mychart message for more details 

## 2021-01-17 ENCOUNTER — Encounter: Payer: Self-pay | Admitting: Oncology

## 2021-01-17 ENCOUNTER — Other Ambulatory Visit: Payer: Self-pay | Admitting: Adult Health

## 2021-01-17 ENCOUNTER — Other Ambulatory Visit: Payer: Self-pay | Admitting: General Surgery

## 2021-01-17 ENCOUNTER — Encounter: Payer: Self-pay | Admitting: Adult Health

## 2021-01-17 DIAGNOSIS — N61 Mastitis without abscess: Secondary | ICD-10-CM

## 2021-01-17 DIAGNOSIS — Z853 Personal history of malignant neoplasm of breast: Secondary | ICD-10-CM

## 2021-01-17 DIAGNOSIS — Z09 Encounter for follow-up examination after completed treatment for conditions other than malignant neoplasm: Secondary | ICD-10-CM

## 2021-01-20 ENCOUNTER — Encounter: Payer: Self-pay | Admitting: Oncology

## 2021-01-20 ENCOUNTER — Other Ambulatory Visit: Payer: 59

## 2021-01-20 ENCOUNTER — Ambulatory Visit
Admission: RE | Admit: 2021-01-20 | Discharge: 2021-01-20 | Disposition: A | Discharge status: Home / Self Care | Payer: 59 | Source: Ambulatory Visit | Attending: General Surgery | Admitting: General Surgery

## 2021-01-20 ENCOUNTER — Other Ambulatory Visit: Payer: Self-pay

## 2021-01-20 DIAGNOSIS — Z09 Encounter for follow-up examination after completed treatment for conditions other than malignant neoplasm: Secondary | ICD-10-CM

## 2021-01-20 DIAGNOSIS — N61 Mastitis without abscess: Secondary | ICD-10-CM

## 2021-01-20 DIAGNOSIS — Z853 Personal history of malignant neoplasm of breast: Secondary | ICD-10-CM

## 2021-01-20 DIAGNOSIS — R922 Inconclusive mammogram: Secondary | ICD-10-CM | POA: Diagnosis not present

## 2021-01-20 DIAGNOSIS — N6489 Other specified disorders of breast: Secondary | ICD-10-CM | POA: Diagnosis not present

## 2021-01-21 ENCOUNTER — Other Ambulatory Visit: Payer: Self-pay | Admitting: *Deleted

## 2021-01-21 ENCOUNTER — Telehealth: Payer: Self-pay | Admitting: *Deleted

## 2021-01-21 ENCOUNTER — Encounter: Payer: Self-pay | Admitting: *Deleted

## 2021-01-21 DIAGNOSIS — N61 Mastitis without abscess: Secondary | ICD-10-CM

## 2021-01-21 MED ORDER — AMOXICILLIN-POT CLAVULANATE 875-125 MG PO TABS
1.0000 | ORAL_TABLET | Freq: Two times a day (BID) | ORAL | 0 refills | Status: DC
Start: 2021-01-21 — End: 2021-02-25

## 2021-01-21 NOTE — Telephone Encounter (Signed)
Pt left my chart message :  It's coming back. Redness never went away completely and is returning. Fever of 100. Had US done. No abscess or fluid. Throat soreness and bad taste got better for about 2 days and has come back.    Dr Richardson Dopp at Imaging reccomended getting Dr Donne Hazel involved for next antibiotic choice. Left a message with his office.    He is in surgery all day and the office person didn't sound like she cared if my med question was addressed today or not.    Suggestions welcome. Feel free to contact Dr Cristal Generous nurse if you want to. I got stuck with the Triage nurse.    I'm trying not to miss more work.  Of note pt had mammogram and [place] yesterday noting area of concern. This note will be forwarded to [facility]/NP and Dr Donne Hazel Please advise for further recommendations- thank you Val

## 2021-01-22 ENCOUNTER — Other Ambulatory Visit: Payer: Self-pay | Admitting: *Deleted

## 2021-01-22 DIAGNOSIS — Z170 Estrogen receptor positive status [ER+]: Secondary | ICD-10-CM

## 2021-01-22 DIAGNOSIS — C50412 Malignant neoplasm of upper-outer quadrant of left female breast: Secondary | ICD-10-CM

## 2021-01-23 ENCOUNTER — Ambulatory Visit: Payer: 59 | Admitting: Oncology

## 2021-01-23 ENCOUNTER — Other Ambulatory Visit: Payer: 59

## 2021-01-23 ENCOUNTER — Other Ambulatory Visit (HOSPITAL_BASED_OUTPATIENT_CLINIC_OR_DEPARTMENT_OTHER): Payer: Self-pay

## 2021-01-23 DIAGNOSIS — N61 Mastitis without abscess: Secondary | ICD-10-CM | POA: Diagnosis not present

## 2021-01-23 MED ORDER — DOXYCYCLINE HYCLATE 100 MG PO CAPS
ORAL_CAPSULE | ORAL | 0 refills | Status: DC
  Filled 2021-01-23: qty 60, 30d supply, fill #0

## 2021-01-23 MED FILL — Gabapentin Cap 300 MG: ORAL | 90 days supply | Qty: 90 | Fill #1 | Status: AC

## 2021-01-27 ENCOUNTER — Telehealth: Payer: Self-pay | Admitting: Oncology

## 2021-01-27 NOTE — Telephone Encounter (Signed)
R/s October appts to mid November per sch msg. Called and left msg

## 2021-02-02 ENCOUNTER — Other Ambulatory Visit: Payer: Self-pay | Admitting: Surgery

## 2021-02-02 MED ORDER — SULFAMETHOXAZOLE-TRIMETHOPRIM 800-160 MG PO TABS: 1.0000 | ORAL_TABLET | Freq: Two times a day (BID) | ORAL | 0 refills | Status: AC

## 2021-02-03 DIAGNOSIS — Z9889 Other specified postprocedural states: Secondary | ICD-10-CM | POA: Diagnosis not present

## 2021-02-04 ENCOUNTER — Other Ambulatory Visit: Payer: Self-pay | Admitting: General Surgery

## 2021-02-04 ENCOUNTER — Other Ambulatory Visit (HOSPITAL_BASED_OUTPATIENT_CLINIC_OR_DEPARTMENT_OTHER): Payer: Self-pay

## 2021-02-04 DIAGNOSIS — L308 Other specified dermatitis: Secondary | ICD-10-CM | POA: Diagnosis not present

## 2021-02-04 DIAGNOSIS — N61 Mastitis without abscess: Secondary | ICD-10-CM | POA: Diagnosis not present

## 2021-02-04 DIAGNOSIS — L309 Dermatitis, unspecified: Secondary | ICD-10-CM | POA: Diagnosis not present

## 2021-02-04 MED ORDER — SULFAMETHOXAZOLE-TRIMETHOPRIM 800-160 MG PO TABS
ORAL_TABLET | ORAL | 0 refills | Status: DC
  Filled 2021-02-04 – 2021-02-06 (×3): qty 20, 10d supply, fill #0

## 2021-02-04 MED ORDER — ONDANSETRON 4 MG PO TBDP
ORAL_TABLET | ORAL | 1 refills | Status: DC
  Filled 2021-02-04 (×2): qty 20, 7d supply, fill #0

## 2021-02-05 ENCOUNTER — Inpatient Hospital Stay: Payer: 59 | Attending: Adult Health

## 2021-02-05 ENCOUNTER — Other Ambulatory Visit: Payer: Self-pay

## 2021-02-05 ENCOUNTER — Telehealth: Payer: Self-pay | Admitting: *Deleted

## 2021-02-05 ENCOUNTER — Encounter: Payer: Self-pay | Admitting: Oncology

## 2021-02-05 ENCOUNTER — Other Ambulatory Visit: Payer: Self-pay | Admitting: *Deleted

## 2021-02-05 VITALS — BP 117/61 | HR 83 | Temp 98.3°F | Resp 17

## 2021-02-05 DIAGNOSIS — Z170 Estrogen receptor positive status [ER+]: Secondary | ICD-10-CM | POA: Insufficient documentation

## 2021-02-05 DIAGNOSIS — C50412 Malignant neoplasm of upper-outer quadrant of left female breast: Secondary | ICD-10-CM | POA: Diagnosis not present

## 2021-02-05 DIAGNOSIS — N61 Mastitis without abscess: Secondary | ICD-10-CM | POA: Insufficient documentation

## 2021-02-05 DIAGNOSIS — L03119 Cellulitis of unspecified part of limb: Secondary | ICD-10-CM

## 2021-02-05 MED ORDER — DEXTROSE 5 % IV SOLN
2.0000 g | INTRAVENOUS | Status: DC
  Administered 2021-02-05: 2 g via INTRAVENOUS
  Filled 2021-02-05: qty 20

## 2021-02-05 MED ORDER — VANCOMYCIN HCL 1000 MG IV SOLR: 1000.0000 mg | INTRAVENOUS | Status: DC

## 2021-02-05 MED ORDER — SODIUM CHLORIDE 0.9 % IV SOLN: Freq: Once | INTRAVENOUS | Status: AC

## 2021-02-05 MED ORDER — VANCOMYCIN HCL 1000 MG IV SOLR
1000.0000 mg | Freq: Once | INTRAVENOUS | Status: AC
  Administered 2021-02-05: 1000 mg via INTRAVENOUS
  Filled 2021-02-05: qty 20

## 2021-02-05 MED ORDER — DEXTROSE 5 % IV SOLN: 2.0000 g | INTRAVENOUS | Status: DC

## 2021-02-05 NOTE — Patient Instructions (Signed)
Cellulitis, Adult Cellulitis is a skin infection. The infected area is usually warm, red, swollen, and tender. This condition occurs most often in the arms and lower legs. The infection can travel to the muscles, blood, and underlying tissue and become serious. It is very important to get treated for this condition. What are the causes? Cellulitis is caused by bacteria. The bacteria enter through a break in the skin, such as a cut, burn, insect bite, open sore, or crack. What increases the risk? This condition is more likely to occur in people who: Have a weak body defense system (immune system). Have open wounds on the skin, such as cuts, burns, bites, and scrapes. Bacteria can enter the body through these open wounds. Are older than 55 years of age. Have diabetes. Have a type of long-lasting (chronic) liver disease (cirrhosis) or kidney disease. Are obese. Have a skin condition such as: Itchy rash (eczema). Slow movement of blood in the veins (venous stasis). Fluid buildup below the skin (edema). Have had radiation therapy. Use IV drugs. What are the signs or symptoms? Symptoms of this condition include: Redness, streaking, or spotting on the skin. Swollen area of the skin. Tenderness or pain when an area of the skin is touched. Warm skin. A fever. Chills. Blisters. How is this diagnosed? This condition is diagnosed based on a medical history and physical exam. You may also have tests, including: Blood tests. Imaging tests. How is this treated? Treatment for this condition may include: Medicines, such as antibiotic medicines or medicines to treat allergies (antihistamines). Supportive care, such as rest and application of cold or warm cloths (compresses) to the skin. Hospital care, if the condition is severe. The infection usually starts to get better within 1-2 days of treatment. Follow these instructions at home: Medicines Take over-the-counter and prescription medicines  only as told by your health care provider. If you were prescribed an antibiotic medicine, take it as told by your health care provider. Do not stop taking the antibiotic even if you start to feel better. General instructions Drink enough fluid to keep your urine pale yellow. Do not touch or rub the infected area. Raise (elevate) the infected area above the level of your heart while you are sitting or lying down. Apply warm or cold compresses to the affected area as told by your health care provider. Keep all follow-up visits as told by your health care provider. This is important. These visits let your health care provider make sure a more serious infection is not developing. Contact a health care provider if: You have a fever. Your symptoms do not begin to improve within 1-2 days of starting treatment. Your bone or joint underneath the infected area becomes painful after the skin has healed. Your infection returns in the same area or another area. You notice a swollen bump in the infected area. You develop new symptoms. You have a general ill feeling (malaise) with muscle aches and pains. Get help right away if: Your symptoms get worse. You feel very sleepy. You develop vomiting or diarrhea that persists. You notice red streaks coming from the infected area. Your red area gets larger or turns dark in color. These symptoms may represent a serious problem that is an emergency. Do not wait to see if the symptoms will go away. Get medical help right away. Call your local emergency services (911 in the [place] Do not drive yourself to the hospital. Summary Cellulitis is a skin infection. This condition occurs most often in  the arms and lower legs. Treatment for this condition may include medicines, such as antibiotic medicines or antihistamines. Take over-the-counter and prescription medicines only as told by your health care provider. If you were prescribed an antibiotic medicine, do not stop  taking the antibiotic even if you start to feel better. Contact a health care provider if your symptoms do not begin to improve within 1-2 days of starting treatment or your symptoms get worse. Keep all follow-up visits as told by your health care provider. This is important. These visits let your health care provider make sure that a more serious infection is not developing. This information is not intended to replace advice given to you by your health care provider. Make sure you discuss any questions you have with your health care provider. Document Revised: 04/24/2019 Document Reviewed: 09/02/2017 Elsevier Patient Education  [place].  Ceftriaxone Injection What is this medication? CEFTRIAXONE (sef try AX one) treats infections caused by bacteria. It belongs to a group of medications called cephalosporin antibiotics. It will not treat colds, the flu, or infections caused by viruses. This medicine may be used for other purposes; ask your health care provider or pharmacist if you have questions. COMMON BRAND NAME(S): Ceftrisol Plus, Rocephin What should I tell my care team before I take this medication? They need to know if you have any of these conditions: Bleeding disorder High bilirubin level in newborn patients Kidney disease Liver disease Poor nutrition An unusual or allergic reaction to ceftriaxone, other penicillin or cephalosporin antibiotics, other medicines, foods, dyes, or preservatives Pregnant or trying to get pregnant Breast-feeding How should I use this medication? This medication is injected into a vein or into a muscle. It is usually given by a health care provider in a hospital or clinic setting. It may also be given at home. If you get this medication at home, you will be taught how to prepare and give it. Use exactly as directed. Take it as directed on the prescription label at the same time every day. Take all of this medication unless your care team tells you  to stop it early. Keep taking it even if you think you are better. It is important that you put your used needles and syringes in a special sharps container. Do not put them in a trash can. If you do not have a sharps container, call your care team to get one. Talk to your care team about the use of this medication in children. While it may be prescribed for children as young as newborns for selected conditions, precautions do apply. Overdosage: If you think you have taken too much of this medicine contact a poison control center or emergency room at once. NOTE: This medicine is only for you. Do not share this medicine with others. What if I miss a dose? If you get this medication at the hospital or clinic: It is important not to miss your dose. Call your care team if you are unable to keep an appointment. If you give yourself this medication at home: If you miss a dose, take it as soon as you can. Then continue your normal schedule. If it is almost time for your next dose, take only that dose. Do not take double or extra doses. Call your care team with questions. What may interact with this medication? Birth control pills Intravenous calcium This list may not describe all possible interactions. Give your health care provider a list of all the medicines, herbs, non-prescription drugs,  or dietary supplements you use. Also tell them if you smoke, drink alcohol, or use illegal drugs. Some items may interact with your medicine. What should I watch for while using this medication? Tell your care team if your symptoms do not start to get better or if they get worse. Do not treat diarrhea with over the counter products. Contact your care team if you have diarrhea that lasts more than 2 days or if it is severe and watery. If you have diabetes, you may get a false-positive result for sugar in your urine. Check with your care team. If you are being treated for a sexually transmitted disease (STD), avoid sexual  contact until you have finished your treatment. Your sexual partner may also need treatment. What side effects may I notice from receiving this medication? Side effects that you should report to your care team as soon as possible: Allergic reactions-skin rash, itching, hives, swelling of the face, lips, tongue, or throat Confusion Drowsiness Gallbladder problems-severe stomach pain, nausea, vomiting, fever Kidney injury-decrease in the amount of urine, swelling of the ankles, hands, or feet Kidney stones-blood in the urine, pain or trouble passing urine, pain in the lower back or sides Low red blood cell count-unusual weakness or fatigue, dizziness, headache, trouble breathing Pancreatitis-severe stomach pain that spreads to your back or gets worse after eating or when touched, fever, nausea, vomiting Seizures Severe diarrhea, fever Unusual weakness or fatigue Side effects that usually do not require medical attention (report to your care team if they continue or are bothersome): Diarrhea This list may not describe all possible side effects. Call your doctor for medical advice about side effects. You may report side effects to FDA at 1-800-FDA-1088. Where should I keep my medication? Keep out of the reach of children and pets. You will be instructed on how to store this medication. Get rid of any unused medication after the expiration date. To get rid of medications that are no longer needed or have expired: Take the medication to a medication take-back program. Check with your pharmacy or law enforcement to find a location. If you cannot return the medication, ask your care team how to get rid of this medication safely. NOTE: This sheet is a summary. It may not cover all possible information. If you have questions about this medicine, talk to your doctor, pharmacist, or health care provider.  2022 Elsevier/[employer] Standard (2020-05-21 09:56:16)  Vancomycin injection What is this  medication? VANCOMYCIN Lucianne Lei koe MYE sin) is a glycopeptide antibiotic. It is used to treat certain kinds of bacterial infections. It will not work for colds, flu, or other viral infections. This medicine may be used for other purposes; ask your health care provider or pharmacist if you have questions. COMMON BRAND NAME(S): Glo Herring What should I tell my care team before I take this medication? They need to know if you have any of these conditions: dehydration hearing loss kidney disease other chronic illness an unusual or allergic reaction to vancomycin, other medicines, foods, dyes, or preservatives pregnant or trying to get pregnant breast-feeding How should I use this medication? This medicine is infused into a vein. It is usually given by a health care provider in a hospital or clinic. If you receive this medicine at home, you will receive special instructions. Take your medicine at regular intervals. Do not take your medicine more often than directed. Take all of your medicine as directed even if you think you are better. Do not skip doses or stop  your medicine early. It is important that you put your used needles and syringes in a special sharps container. Do not put them in a trash can. If you do not have a sharps container, call your pharmacist or healthcare provider to get one. Talk to your pediatrician regarding the use of this medicine in children. While this drug may be prescribed for even very young infants for selected conditions, precautions do apply. Overdosage: If you think you have taken too much of this medicine contact a poison control center or emergency room at once. NOTE: This medicine is only for you. Do not share this medicine with others. What if I miss a dose? If you miss a dose, take it as soon as you can. If it is almost time for your next dose, take only that dose. Do not take double or extra doses. What may interact with this medication? amphotericin  B anesthetics bacitracin birth control pills cisplatin colistin diuretics other aminoglycoside antibiotics polymyxin B This list may not describe all possible interactions. Give your health care provider a list of all the medicines, herbs, non-prescription drugs, or dietary supplements you use. Also tell them if you smoke, drink alcohol, or use illegal drugs. Some items may interact with your medicine. What should I watch for while using this medication? Tell your doctor or health care provider if your symptoms do not improve or if you get new symptoms. Your condition and lab work will be monitored while you are taking this medicine. Do not treat diarrhea with over the counter products. Contact your doctor if you have diarrhea that lasts more than 2 days or if it is severe and watery. This medicine may cause serious skin reactions. They can happen weeks to months after starting the medicine. Contact your health care provider right away if you notice fevers or flu-like symptoms with a rash. The rash may be red or purple and then turn into blisters or peeling of the skin. Or, you might notice a red rash with swelling of the face, lips or lymph nodes in your neck or under your arms. What side effects may I notice from receiving this medication? Side effects that you should report to your doctor or health care professional as soon as possible: allergic reactions like skin rash, itching or hives, swelling of the face, lips, or tongue breathing difficulty, wheezing change in amount, color of urine change in hearing chest pain dizziness fever, chills flushing of the face and neck (reddening) low blood pressure rash, fever, and swollen lymph nodes redness, blistering, peeling or loosening of the skin, including inside the mouth unusual bleeding or bruising unusually weak or tired Side effects that usually do not require medical attention (report to your doctor or health care professional if they  continue or are bothersome): nausea, vomiting pain, swelling where injected stomach cramps This list may not describe all possible side effects. Call your doctor for medical advice about side effects. You may report side effects to FDA at 1-800-FDA-1088. Where should I keep my medication? Keep out of the reach of children. You will be instructed on how to store this medicine, if needed. Throw away any unused medicine after the expiration date on the label. NOTE: This sheet is a summary. It may not cover all possible information. If you have questions about this medicine, talk to your doctor, pharmacist, or health care provider.  2022 Elsevier/[employer] Standard (2018-07-22 16:14:12)

## 2021-02-05 NOTE — Telephone Encounter (Signed)
Per communication with Dr Donne Hazel and per continued cellulitis in [place]

## 2021-02-06 ENCOUNTER — Other Ambulatory Visit: Payer: Self-pay | Admitting: *Deleted

## 2021-02-06 ENCOUNTER — Other Ambulatory Visit (HOSPITAL_BASED_OUTPATIENT_CLINIC_OR_DEPARTMENT_OTHER): Payer: Self-pay

## 2021-02-06 ENCOUNTER — Inpatient Hospital Stay: Payer: 59

## 2021-02-06 DIAGNOSIS — N61 Mastitis without abscess: Secondary | ICD-10-CM | POA: Diagnosis not present

## 2021-02-06 DIAGNOSIS — L03818 Cellulitis of other sites: Secondary | ICD-10-CM

## 2021-02-06 DIAGNOSIS — Z170 Estrogen receptor positive status [ER+]: Secondary | ICD-10-CM

## 2021-02-06 DIAGNOSIS — C50412 Malignant neoplasm of upper-outer quadrant of left female breast: Secondary | ICD-10-CM

## 2021-02-06 MED ORDER — VANCOMYCIN HCL 1000 MG IV SOLR
1000.0000 mg | Freq: Once | INTRAVENOUS | Status: AC
  Administered 2021-02-06: 1000 mg via INTRAVENOUS
  Filled 2021-02-06: qty 20

## 2021-02-06 MED ORDER — DEXTROSE 5 % IV SOLN
2.0000 g | Freq: Once | INTRAVENOUS | Status: AC
  Administered 2021-02-06: 2 g via INTRAVENOUS
  Filled 2021-02-06: qty 20

## 2021-02-06 MED ORDER — SODIUM CHLORIDE 0.9 % IV SOLN: INTRAVENOUS | Status: DC

## 2021-02-06 NOTE — Patient Instructions (Addendum)
Vancomycin injection What is this medication? VANCOMYCIN Lucianne Lei koe MYE sin) is a glycopeptide antibiotic. It is used to treat certain kinds of bacterial infections. It will not work for colds, flu, or other viral infections. This medicine may be used for other purposes; ask your health care provider or pharmacist if you have questions. COMMON BRAND NAME(S): Glo Herring What should I tell my care team before I take this medication? They need to know if you have any of these conditions: dehydration hearing loss kidney disease other chronic illness an unusual or allergic reaction to vancomycin, other medicines, foods, dyes, or preservatives pregnant or trying to get pregnant breast-feeding How should I use this medication? This medicine is infused into a vein. It is usually given by a health care provider in a hospital or clinic. If you receive this medicine at home, you will receive special instructions. Take your medicine at regular intervals. Do not take your medicine more often than directed. Take all of your medicine as directed even if you think you are better. Do not skip doses or stop your medicine early. It is important that you put your used needles and syringes in a special sharps container. Do not put them in a trash can. If you do not have a sharps container, call your pharmacist or healthcare provider to get one. Talk to your pediatrician regarding the use of this medicine in children. While this drug may be prescribed for even very young infants for selected conditions, precautions do apply. Overdosage: If you think you have taken too much of this medicine contact a poison control center or emergency room at once. NOTE: This medicine is only for you. Do not share this medicine with others. What if I miss a dose? If you miss a dose, take it as soon as you can. If it is almost time for your next dose, take only that dose. Do not take double or extra doses. What may interact  with this medication? amphotericin B anesthetics bacitracin birth control pills cisplatin colistin diuretics other aminoglycoside antibiotics polymyxin B This list may not describe all possible interactions. Give your health care provider a list of all the medicines, herbs, non-prescription drugs, or dietary supplements you use. Also tell them if you smoke, drink alcohol, or use illegal drugs. Some items may interact with your medicine. What should I watch for while using this medication? Tell your doctor or health care provider if your symptoms do not improve or if you get new symptoms. Your condition and lab work will be monitored while you are taking this medicine. Do not treat diarrhea with over the counter products. Contact your doctor if you have diarrhea that lasts more than 2 days or if it is severe and watery. This medicine may cause serious skin reactions. They can happen weeks to months after starting the medicine. Contact your health care provider right away if you notice fevers or flu-like symptoms with a rash. The rash may be red or purple and then turn into blisters or peeling of the skin. Or, you might notice a red rash with swelling of the face, lips or lymph nodes in your neck or under your arms. What side effects may I notice from receiving this medication? Side effects that you should report to your doctor or health care professional as soon as possible: allergic reactions like skin rash, itching or hives, swelling of the face, lips, or tongue breathing difficulty, wheezing change in amount, color of urine change in hearing chest  pain dizziness fever, chills flushing of the face and neck (reddening) low blood pressure rash, fever, and swollen lymph nodes redness, blistering, peeling or loosening of the skin, including inside the mouth unusual bleeding or bruising unusually weak or tired Side effects that usually do not require medical attention (report to your doctor  or health care professional if they continue or are bothersome): nausea, vomiting pain, swelling where injected stomach cramps This list may not describe all possible side effects. Call your doctor for medical advice about side effects. You may report side effects to FDA at 1-800-FDA-1088. Where should I keep my medication? Keep out of the reach of children. You will be instructed on how to store this medicine, if needed. Throw away any unused medicine after the expiration date on the label. NOTE: This sheet is a summary. It may not cover all possible information. If you have questions about this medicine, talk to your doctor, pharmacist, or health care provider.  2022 [employer] Standard (2018-07-22 16:14:12)  Ceftriaxone Injection What is this medication? CEFTRIAXONE (sef try AX one) treats infections caused by bacteria. It belongs to a group of medications called cephalosporin antibiotics. It will not treat colds, the flu, or infections caused by viruses. This medicine may be used for other purposes; ask your health care provider or pharmacist if you have questions. COMMON BRAND NAME(S): Ceftrisol Plus, Rocephin What should I tell my care team before I take this medication? They need to know if you have any of these conditions: Bleeding disorder High bilirubin level in newborn patients Kidney disease Liver disease Poor nutrition An unusual or allergic reaction to ceftriaxone, other penicillin or cephalosporin antibiotics, other medicines, foods, dyes, or preservatives Pregnant or trying to get pregnant Breast-feeding How should I use this medication? This medication is injected into a vein or into a muscle. It is usually given by a health care provider in a hospital or clinic setting. It may also be given at home. If you get this medication at home, you will be taught how to prepare and give it. Use exactly as directed. Take it as directed on the prescription label at the same time  every day. Take all of this medication unless your care team tells you to stop it early. Keep taking it even if you think you are better. It is important that you put your used needles and syringes in a special sharps container. Do not put them in a trash can. If you do not have a sharps container, call your care team to get one. Talk to your care team about the use of this medication in children. While it may be prescribed for children as young as newborns for selected conditions, precautions do apply. Overdosage: If you think you have taken too much of this medicine contact a poison control center or emergency room at once. NOTE: This medicine is only for you. Do not share this medicine with others. What if I miss a dose? If you get this medication at the hospital or clinic: It is important not to miss your dose. Call your care team if you are unable to keep an appointment. If you give yourself this medication at home: If you miss a dose, take it as soon as you can. Then continue your normal schedule. If it is almost time for your next dose, take only that dose. Do not take double or extra doses. Call your care team with questions. What may interact with this medication? Birth control pills Intravenous calcium  This list may not describe all possible interactions. Give your health care provider a list of all the medicines, herbs, non-prescription drugs, or dietary supplements you use. Also tell them if you smoke, drink alcohol, or use illegal drugs. Some items may interact with your medicine. What should I watch for while using this medication? Tell your care team if your symptoms do not start to get better or if they get worse. Do not treat diarrhea with over the counter products. Contact your care team if you have diarrhea that lasts more than 2 days or if it is severe and watery. If you have diabetes, you may get a false-positive result for sugar in your urine. Check with your care team. If you  are being treated for a sexually transmitted disease (STD), avoid sexual contact until you have finished your treatment. Your sexual partner may also need treatment. What side effects may I notice from receiving this medication? Side effects that you should report to your care team as soon as possible: Allergic reactions-skin rash, itching, hives, swelling of the face, lips, tongue, or throat Confusion Drowsiness Gallbladder problems-severe stomach pain, nausea, vomiting, fever Kidney injury-decrease in the amount of urine, swelling of the ankles, hands, or feet Kidney stones-blood in the urine, pain or trouble passing urine, pain in the lower back or sides Low red blood cell count-unusual weakness or fatigue, dizziness, headache, trouble breathing Pancreatitis-severe stomach pain that spreads to your back or gets worse after eating or when touched, fever, nausea, vomiting Seizures Severe diarrhea, fever Unusual weakness or fatigue Side effects that usually do not require medical attention (report to your care team if they continue or are bothersome): Diarrhea This list may not describe all possible side effects. Call your doctor for medical advice about side effects. You may report side effects to FDA at 1-800-FDA-1088. Where should I keep my medication? Keep out of the reach of children and pets. You will be instructed on how to store this medication. Get rid of any unused medication after the expiration date. To get rid of medications that are no longer needed or have expired: Take the medication to a medication take-back program. Check with your pharmacy or law enforcement to find a location. If you cannot return the medication, ask your care team how to get rid of this medication safely. NOTE: This sheet is a summary. It may not cover all possible information. If you have questions about this medicine, talk to your doctor, pharmacist, or health care provider.  2022 [employer]/Gold  Standard (2020-05-21 09:56:16)

## 2021-02-07 ENCOUNTER — Other Ambulatory Visit: Payer: Self-pay

## 2021-02-07 ENCOUNTER — Telehealth: Payer: Self-pay | Admitting: *Deleted

## 2021-02-07 ENCOUNTER — Inpatient Hospital Stay: Payer: 59

## 2021-02-07 DIAGNOSIS — N61 Mastitis without abscess: Secondary | ICD-10-CM | POA: Diagnosis not present

## 2021-02-07 DIAGNOSIS — C50412 Malignant neoplasm of upper-outer quadrant of left female breast: Secondary | ICD-10-CM | POA: Diagnosis not present

## 2021-02-07 DIAGNOSIS — L03818 Cellulitis of other sites: Secondary | ICD-10-CM

## 2021-02-07 DIAGNOSIS — Z170 Estrogen receptor positive status [ER+]: Secondary | ICD-10-CM

## 2021-02-07 MED ORDER — VANCOMYCIN HCL 1000 MG IV SOLR
1000.0000 mg | Freq: Once | INTRAVENOUS | Status: DC
  Filled 2021-02-07: qty 20

## 2021-02-07 MED ORDER — VANCOMYCIN HCL 1000 MG IV SOLR
1000.0000 mg | Freq: Once | INTRAVENOUS | Status: AC
  Administered 2021-02-07: 1000 mg via INTRAVENOUS
  Filled 2021-02-07: qty 20

## 2021-02-07 MED ORDER — DEXTROSE 5 % IV SOLN
2.0000 g | Freq: Once | INTRAVENOUS | Status: AC
  Administered 2021-02-07: 2 g via INTRAVENOUS
  Filled 2021-02-07: qty 20

## 2021-02-07 MED ORDER — SODIUM CHLORIDE 0.9 % IV SOLN: Freq: Once | INTRAVENOUS | Status: AC

## 2021-02-07 MED FILL — Ceftriaxone Sodium For Inj 2 GM: INTRAMUSCULAR | Qty: 20 | Status: AC

## 2021-02-07 NOTE — Telephone Encounter (Signed)
Received vm call from pt stating that she is concerned that her infection won't be gone by Monday & may need Vanco.  She is asking for an estimate for 5 day course at home via PICC vs Hospital stay.   Discussed with Mendel Ryder & will defer to Dr Donne Hazel.  She should touch base with him on Monday.  Notified pt & she will check with Dr Cristal Generous office b/c she thought she was to get 5 doses of Vanco.   She is not scheduled here for vanco on Monday.

## 2021-02-07 NOTE — Patient Instructions (Signed)
Vancomycin  What is this medication? VANCOMYCIN Lucianne Lei koe MYE sin) is a glycopeptide antibiotic. It is used to treat certain kinds of bacterial infections in the bowel. It will not work for colds, flu, or other viral infections. This medicine may be used for other purposes; ask your health care provider or pharmacist if you have questions. COMMON BRAND NAME(S): Vancocin What should I tell my care team before I take this medication? They need to know if you have any of these conditions: bowel, intestines, stomach disease kidney disease an unusual or allergic reaction to vancomycin, other medicines, foods, dyes, or preservatives pregnant or trying to get pregnant breast-feeding How should I use this medication? Take this medicine by mouth with a glass of water. Follow the directions on the prescription label. Take your medicine at regular intervals. Do not take your medicine more often than directed. Take all of your medicine as directed even if you think you are better. Do not skip doses or stop your medicine early. Talk to your pediatrician regarding the use of this medicine in children. Special care may be needed. Overdosage: If you think you have taken too much of this medicine contact a poison control center or emergency room at once. NOTE: This medicine is only for you. Do not share this medicine with others. What if I miss a dose? If you miss a dose, take it as soon as you can. If it is almost time for your next dose, take only that dose. Do not take double or extra doses. What may interact with this medication? birth control pills cholestyramine colestipol vancomycin injection This list may not describe all possible interactions. Give your health care provider a list of all the medicines, herbs, non-prescription drugs, or dietary supplements you use. Also tell them if you smoke, drink alcohol, or use illegal drugs. Some items may interact with your medicine. What should I watch for  while using this medication? Tell your doctor or health care provider if your symptoms do not improve or if you get new symptoms. This medicine may cause serious skin reactions. They can happen weeks to months after starting the medicine. Contact your health care provider right away if you notice fevers or flu-like symptoms with a rash. The rash may be red or purple and then turn into blisters or peeling of the skin. Or, you might notice a red rash with swelling of the face, lips or lymph nodes in your neck or under your arms. Avoid taking this medicine within 3 or 4 hours of taking cholestyramine or colestipol. What side effects may I notice from receiving this medication? Side effects that you should report to your doctor or health care professional as soon as possible: allergic reactions like skin rash, itching or hives, swelling of the face, lips, or tongue breathing difficulty change in amount, color of urine change in hearing dizziness fever, infection rash, fever, and swollen lymph nodes redness, blistering, peeling or loosening of the skin, including inside the mouth unusual bleeding or bruising unusually weak or tired Side effects that usually do not require medical attention (report to your doctor or health care professional if they continue or are bothersome): nausea, vomiting stomach cramps This list may not describe all possible side effects. Call your doctor for medical advice about side effects. You may report side effects to FDA at 1-800-FDA-1088. Where should I keep my medication? Keep out of the reach of children. Store at room temperature between 15 and 30 degrees C (59 and  86 degrees F). Throw away any unused medicine after the expiration date. NOTE: This sheet is a summary. It may not cover all possible information. If you have questions about this medicine, talk to your doctor, pharmacist, or health care provider.  2022 [employer] Standard (2018-07-22  16:12:37) Ceftriaxone Injection What is this medication? CEFTRIAXONE (sef try AX one) treats infections caused by bacteria. It belongs to a group of medications called cephalosporin antibiotics. It will not treat colds, the flu, or infections caused by viruses. This medicine may be used for other purposes; ask your health care provider or pharmacist if you have questions. COMMON BRAND NAME(S): Ceftrisol Plus, Rocephin What should I tell my care team before I take this medication? They need to know if you have any of these conditions: Bleeding disorder High bilirubin level in newborn patients Kidney disease Liver disease Poor nutrition An unusual or allergic reaction to ceftriaxone, other penicillin or cephalosporin antibiotics, other medicines, foods, dyes, or preservatives Pregnant or trying to get pregnant Breast-feeding How should I use this medication? This medication is injected into a vein or into a muscle. It is usually given by a health care provider in a hospital or clinic setting. It may also be given at home. If you get this medication at home, you will be taught how to prepare and give it. Use exactly as directed. Take it as directed on the prescription label at the same time every day. Take all of this medication unless your care team tells you to stop it early. Keep taking it even if you think you are better. It is important that you put your used needles and syringes in a special sharps container. Do not put them in a trash can. If you do not have a sharps container, call your care team to get one. Talk to your care team about the use of this medication in children. While it may be prescribed for children as young as newborns for selected conditions, precautions do apply. Overdosage: If you think you have taken too much of this medicine contact a poison control center or emergency room at once. NOTE: This medicine is only for you. Do not share this medicine with others. What if  I miss a dose? If you get this medication at the hospital or clinic: It is important not to miss your dose. Call your care team if you are unable to keep an appointment. If you give yourself this medication at home: If you miss a dose, take it as soon as you can. Then continue your normal schedule. If it is almost time for your next dose, take only that dose. Do not take double or extra doses. Call your care team with questions. What may interact with this medication? Birth control pills Intravenous calcium This list may not describe all possible interactions. Give your health care provider a list of all the medicines, herbs, non-prescription drugs, or dietary supplements you use. Also tell them if you smoke, drink alcohol, or use illegal drugs. Some items may interact with your medicine. What should I watch for while using this medication? Tell your care team if your symptoms do not start to get better or if they get worse. Do not treat diarrhea with over the counter products. Contact your care team if you have diarrhea that lasts more than 2 days or if it is severe and watery. If you have diabetes, you may get a false-positive result for sugar in your urine. Check with your care team. If  you are being treated for a sexually transmitted disease (STD), avoid sexual contact until you have finished your treatment. Your sexual partner may also need treatment. What side effects may I notice from receiving this medication? Side effects that you should report to your care team as soon as possible: Allergic reactions-skin rash, itching, hives, swelling of the face, lips, tongue, or throat Confusion Drowsiness Gallbladder problems-severe stomach pain, nausea, vomiting, fever Kidney injury-decrease in the amount of urine, swelling of the ankles, hands, or feet Kidney stones-blood in the urine, pain or trouble passing urine, pain in the lower back or sides Low red blood cell count-unusual weakness or  fatigue, dizziness, headache, trouble breathing Pancreatitis-severe stomach pain that spreads to your back or gets worse after eating or when touched, fever, nausea, vomiting Seizures Severe diarrhea, fever Unusual weakness or fatigue Side effects that usually do not require medical attention (report to your care team if they continue or are bothersome): Diarrhea This list may not describe all possible side effects. Call your doctor for medical advice about side effects. You may report side effects to FDA at 1-800-FDA-1088. Where should I keep my medication? Keep out of the reach of children and pets. You will be instructed on how to store this medication. Get rid of any unused medication after the expiration date. To get rid of medications that are no longer needed or have expired: Take the medication to a medication take-back program. Check with your pharmacy or law enforcement to find a location. If you cannot return the medication, ask your care team how to get rid of this medication safely. NOTE: This sheet is a summary. It may not cover all possible information. If you have questions about this medicine, talk to your doctor, pharmacist, or health care provider.  2022 [employer] Standard (2020-05-21 09:56:16)

## 2021-02-08 ENCOUNTER — Inpatient Hospital Stay: Payer: 59

## 2021-02-08 VITALS — BP 123/59 | HR 91 | Temp 97.2°F | Resp 20

## 2021-02-08 DIAGNOSIS — C50412 Malignant neoplasm of upper-outer quadrant of left female breast: Secondary | ICD-10-CM | POA: Diagnosis not present

## 2021-02-08 DIAGNOSIS — Z170 Estrogen receptor positive status [ER+]: Secondary | ICD-10-CM

## 2021-02-08 DIAGNOSIS — N61 Mastitis without abscess: Secondary | ICD-10-CM | POA: Diagnosis not present

## 2021-02-08 DIAGNOSIS — L03818 Cellulitis of other sites: Secondary | ICD-10-CM

## 2021-02-08 MED ORDER — VANCOMYCIN HCL 1000 MG IV SOLR
1000.0000 mg | Freq: Once | INTRAVENOUS | Status: AC
  Administered 2021-02-08: 1000 mg via INTRAVENOUS
  Filled 2021-02-08: qty 20

## 2021-02-08 MED ORDER — DEXTROSE 5 % IV SOLN
2.0000 g | Freq: Once | INTRAVENOUS | Status: AC
  Administered 2021-02-08: 2 g via INTRAVENOUS
  Filled 2021-02-08: qty 20

## 2021-02-08 MED ORDER — SODIUM CHLORIDE 0.9 % IV SOLN: Freq: Once | INTRAVENOUS | Status: AC

## 2021-02-08 NOTE — Patient Instructions (Signed)
Ceftriaxone Injection What is this medication? CEFTRIAXONE (sef try AX one) treats infections caused by bacteria. It belongs to a group of medications called cephalosporin antibiotics. It will not treat colds, the flu, or infections caused by viruses. This medicine may be used for other purposes; ask your health care provider or pharmacist if you have questions. COMMON BRAND NAME(S): Ceftrisol Plus, Rocephin What should I tell my care team before I take this medication? They need to know if you have any of these conditions: Bleeding disorder High bilirubin level in newborn patients Kidney disease Liver disease Poor nutrition An unusual or allergic reaction to ceftriaxone, other penicillin or cephalosporin antibiotics, other medicines, foods, dyes, or preservatives Pregnant or trying to get pregnant Breast-feeding How should I use this medication? This medication is injected into a vein or into a muscle. It is usually given by a health care provider in a hospital or clinic setting. It may also be given at home. If you get this medication at home, you will be taught how to prepare and give it. Use exactly as directed. Take it as directed on the prescription label at the same time every day. Take all of this medication unless your care team tells you to stop it early. Keep taking it even if you think you are better. It is important that you put your used needles and syringes in a special sharps container. Do not put them in a trash can. If you do not have a sharps container, call your care team to get one. Talk to your care team about the use of this medication in children. While it may be prescribed for children as young as newborns for selected conditions, precautions do apply. Overdosage: If you think you have taken too much of this medicine contact a poison control center or emergency room at once. NOTE: This medicine is only for you. Do not share this medicine with others. What if I miss a  dose? If you get this medication at the hospital or clinic: It is important not to miss your dose. Call your care team if you are unable to keep an appointment. If you give yourself this medication at home: If you miss a dose, take it as soon as you can. Then continue your normal schedule. If it is almost time for your next dose, take only that dose. Do not take double or extra doses. Call your care team with questions. What may interact with this medication? Birth control pills Intravenous calcium This list may not describe all possible interactions. Give your health care provider a list of all the medicines, herbs, non-prescription drugs, or dietary supplements you use. Also tell them if you smoke, drink alcohol, or use illegal drugs. Some items may interact with your medicine. What should I watch for while using this medication? Tell your care team if your symptoms do not start to get better or if they get worse. Do not treat diarrhea with over the counter products. Contact your care team if you have diarrhea that lasts more than 2 days or if it is severe and watery. If you have diabetes, you may get a false-positive result for sugar in your urine. Check with your care team. If you are being treated for a sexually transmitted disease (STD), avoid sexual contact until you have finished your treatment. Your sexual partner may also need treatment. What side effects may I notice from receiving this medication? Side effects that you should report to your care team as soon  as possible: Allergic reactions-skin rash, itching, hives, swelling of the face, lips, tongue, or throat Confusion Drowsiness Gallbladder problems-severe stomach pain, nausea, vomiting, fever Kidney injury-decrease in the amount of urine, swelling of the ankles, hands, or feet Kidney stones-blood in the urine, pain or trouble passing urine, pain in the lower back or sides Low red blood cell count-unusual weakness or fatigue,  dizziness, headache, trouble breathing Pancreatitis-severe stomach pain that spreads to your back or gets worse after eating or when touched, fever, nausea, vomiting Seizures Severe diarrhea, fever Unusual weakness or fatigue Side effects that usually do not require medical attention (report to your care team if they continue or are bothersome): Diarrhea This list may not describe all possible side effects. Call your doctor for medical advice about side effects. You may report side effects to FDA at 1-800-FDA-1088. Where should I keep my medication? Keep out of the reach of children and pets. You will be instructed on how to store this medication. Get rid of any unused medication after the expiration date. To get rid of medications that are no longer needed or have expired: Take the medication to a medication take-back program. Check with your pharmacy or law enforcement to find a location. If you cannot return the medication, ask your care team how to get rid of this medication safely. NOTE: This sheet is a summary. It may not cover all possible information. If you have questions about this medicine, talk to your doctor, pharmacist, or health care provider.  2022 Elsevier/Gold Standard (2020-05-21 09:56:16)  Vancomycin injection What is this medication? VANCOMYCIN Lucianne Lei koe MYE sin) is a glycopeptide antibiotic. It is used to treat certain kinds of bacterial infections. It will not work for colds, flu, or other viral infections. This medicine may be used for other purposes; ask your health care provider or pharmacist if you have questions. COMMON BRAND NAME(S): Glo Herring What should I tell my care team before I take this medication? They need to know if you have any of these conditions: dehydration hearing loss kidney disease other chronic illness an unusual or allergic reaction to vancomycin, other medicines, foods, dyes, or preservatives pregnant or trying to get  pregnant breast-feeding How should I use this medication? This medicine is infused into a vein. It is usually given by a health care provider in a hospital or clinic. If you receive this medicine at home, you will receive special instructions. Take your medicine at regular intervals. Do not take your medicine more often than directed. Take all of your medicine as directed even if you think you are better. Do not skip doses or stop your medicine early. It is important that you put your used needles and syringes in a special sharps container. Do not put them in a trash can. If you do not have a sharps container, call your pharmacist or healthcare provider to get one. Talk to your pediatrician regarding the use of this medicine in children. While this drug may be prescribed for even very young infants for selected conditions, precautions do apply. Overdosage: If you think you have taken too much of this medicine contact a poison control center or emergency room at once. NOTE: This medicine is only for you. Do not share this medicine with others. What if I miss a dose? If you miss a dose, take it as soon as you can. If it is almost time for your next dose, take only that dose. Do not take double or extra doses. What may interact  with this medication? amphotericin B anesthetics bacitracin birth control pills cisplatin colistin diuretics other aminoglycoside antibiotics polymyxin B This list may not describe all possible interactions. Give your health care provider a list of all the medicines, herbs, non-prescription drugs, or dietary supplements you use. Also tell them if you smoke, drink alcohol, or use illegal drugs. Some items may interact with your medicine. What should I watch for while using this medication? Tell your doctor or health care provider if your symptoms do not improve or if you get new symptoms. Your condition and lab work will be monitored while you are taking this medicine. Do  not treat diarrhea with over the counter products. Contact your doctor if you have diarrhea that lasts more than 2 days or if it is severe and watery. This medicine may cause serious skin reactions. They can happen weeks to months after starting the medicine. Contact your health care provider right away if you notice fevers or flu-like symptoms with a rash. The rash may be red or purple and then turn into blisters or peeling of the skin. Or, you might notice a red rash with swelling of the face, lips or lymph nodes in your neck or under your arms. What side effects may I notice from receiving this medication? Side effects that you should report to your doctor or health care professional as soon as possible: allergic reactions like skin rash, itching or hives, swelling of the face, lips, or tongue breathing difficulty, wheezing change in amount, color of urine change in hearing chest pain dizziness fever, chills flushing of the face and neck (reddening) low blood pressure rash, fever, and swollen lymph nodes redness, blistering, peeling or loosening of the skin, including inside the mouth unusual bleeding or bruising unusually weak or tired Side effects that usually do not require medical attention (report to your doctor or health care professional if they continue or are bothersome): nausea, vomiting pain, swelling where injected stomach cramps This list may not describe all possible side effects. Call your doctor for medical advice about side effects. You may report side effects to FDA at 1-800-FDA-1088. Where should I keep my medication? Keep out of the reach of children. You will be instructed on how to store this medicine, if needed. Throw away any unused medicine after the expiration date on the label. NOTE: This sheet is a summary. It may not cover all possible information. If you have questions about this medicine, talk to your doctor, pharmacist, or health care provider.  2022  Elsevier/Gold Standard (2018-07-22 16:14:12)

## 2021-02-10 ENCOUNTER — Other Ambulatory Visit (HOSPITAL_BASED_OUTPATIENT_CLINIC_OR_DEPARTMENT_OTHER): Payer: Self-pay

## 2021-02-10 ENCOUNTER — Other Ambulatory Visit: Payer: Self-pay

## 2021-02-10 ENCOUNTER — Inpatient Hospital Stay: Payer: 59

## 2021-02-10 ENCOUNTER — Other Ambulatory Visit: Payer: Self-pay | Admitting: Pharmacy Technician

## 2021-02-10 ENCOUNTER — Other Ambulatory Visit: Payer: Self-pay | Admitting: *Deleted

## 2021-02-10 DIAGNOSIS — L03818 Cellulitis of other sites: Secondary | ICD-10-CM

## 2021-02-10 DIAGNOSIS — N61 Mastitis without abscess: Secondary | ICD-10-CM | POA: Diagnosis not present

## 2021-02-10 DIAGNOSIS — C50412 Malignant neoplasm of upper-outer quadrant of left female breast: Secondary | ICD-10-CM

## 2021-02-10 DIAGNOSIS — Z170 Estrogen receptor positive status [ER+]: Secondary | ICD-10-CM

## 2021-02-10 MED ORDER — VANCOMYCIN HCL 1000 MG IV SOLR
1000.0000 mg | Freq: Once | INTRAVENOUS | Status: AC
  Administered 2021-02-10: 1000 mg via INTRAVENOUS
  Filled 2021-02-10: qty 20

## 2021-02-10 MED ORDER — DEXTROSE 5 % IV SOLN
2.0000 g | Freq: Once | INTRAVENOUS | Status: AC
  Administered 2021-02-10: 2 g via INTRAVENOUS
  Filled 2021-02-10: qty 20

## 2021-02-10 MED ORDER — SODIUM CHLORIDE 0.9 % IV SOLN: INTRAVENOUS | Status: DC

## 2021-02-10 NOTE — Patient Instructions (Signed)
[facility] MEDICAL ONCOLOGY  Discharge Instructions: °Thank you for choosing [facility] to provide your oncology and hematology care.  ° °If you have a lab appointment with the Cancer Center, please go directly to the Cancer Center and check in at the registration area. °  ° ° °We strive to give you quality time with your provider. You may need to reschedule your appointment if you arrive late (15 or more minutes).  Arriving late affects you and other patients whose appointments are after yours.  Also, if you miss three or more appointments without notifying the office, you may be dismissed from the clinic at the provider’s discretion.    °  °For prescription refill requests, have your pharmacy contact our office and allow 72 hours for refills to be completed.   ° ° °  °To help prevent nausea and vomiting after your treatment, we encourage you to take your nausea medication as directed. ° °BELOW ARE SYMPTOMS THAT SHOULD BE REPORTED IMMEDIATELY: °*FEVER GREATER THAN 100.4 F (38 °C) OR HIGHER °*CHILLS OR SWEATING °*NAUSEA AND VOMITING THAT IS NOT CONTROLLED WITH YOUR NAUSEA MEDICATION °*UNUSUAL SHORTNESS OF BREATH °*UNUSUAL BRUISING OR BLEEDING °*URINARY PROBLEMS (pain or burning when urinating, or frequent urination) °*BOWEL PROBLEMS (unusual diarrhea, constipation, pain near the anus) °TENDERNESS IN MOUTH AND THROAT WITH OR WITHOUT PRESENCE OF ULCERS (sore throat, sores in mouth, or a toothache) °UNUSUAL RASH, SWELLING OR PAIN  °UNUSUAL VAGINAL DISCHARGE OR ITCHING  ° °Items with * indicate a potential emergency and should be followed up as soon as possible or go to the Emergency Department if any problems should occur. ° °Please show the CHEMOTHERAPY ALERT CARD or IMMUNOTHERAPY ALERT CARD at check-in to the Emergency Department and triage nurse. ° °Should you have questions after your visit or need to cancel or reschedule your appointment, please contact [facility]  MEDICAL ONCOLOGY  Dept: [phone]  and follow the prompts.  Office hours are 8:00 a.m. to 4:30 p.m. Monday - Friday. Please note that voicemails left after 4:00 p.m. may not be returned until the following business day.  We are closed weekends and major holidays. You have access to a nurse at all times for urgent questions. Please call the main number to the clinic Dept: [phone] and follow the prompts. ° ° °For any non-urgent questions, you may also contact your provider using MyChart. We now offer e-Visits for anyone 18 and older to request care online for non-urgent symptoms. For details visit [email or web address]. °  °Also download the MyChart app! Go to the app store, search "MyChart", open the app, select [facility], and log in with your MyChart username and password. ° °Due to Covid, a mask is required upon entering the hospital/clinic. If you do not have a mask, one will be given to you upon arrival. For doctor visits, patients may have 1 support person aged 18 or older with them. For treatment visits, patients cannot have anyone with them due to current Covid guidelines and our immunocompromised population.  ° °

## 2021-02-11 ENCOUNTER — Other Ambulatory Visit: Payer: Self-pay

## 2021-02-11 ENCOUNTER — Other Ambulatory Visit: Payer: 59

## 2021-02-11 ENCOUNTER — Inpatient Hospital Stay: Payer: 59

## 2021-02-11 ENCOUNTER — Ambulatory Visit: Payer: 59 | Admitting: Oncology

## 2021-02-11 VITALS — BP 124/74 | HR 70 | Temp 98.5°F | Resp 16 | Wt 154.0 lb

## 2021-02-11 DIAGNOSIS — N61 Mastitis without abscess: Secondary | ICD-10-CM | POA: Diagnosis not present

## 2021-02-11 DIAGNOSIS — L03818 Cellulitis of other sites: Secondary | ICD-10-CM

## 2021-02-11 DIAGNOSIS — Z170 Estrogen receptor positive status [ER+]: Secondary | ICD-10-CM | POA: Diagnosis not present

## 2021-02-11 DIAGNOSIS — C50412 Malignant neoplasm of upper-outer quadrant of left female breast: Secondary | ICD-10-CM | POA: Diagnosis not present

## 2021-02-11 MED ORDER — DEXTROSE 5 % IV SOLN
2.0000 g | INTRAVENOUS | Status: DC
  Administered 2021-02-11: 2 g via INTRAVENOUS
  Filled 2021-02-11: qty 20

## 2021-02-11 MED ORDER — VANCOMYCIN HCL 1000 MG IV SOLR
1000.0000 mg | INTRAVENOUS | Status: DC
  Administered 2021-02-11: 1000 mg via INTRAVENOUS
  Filled 2021-02-11: qty 20

## 2021-02-11 MED ORDER — SODIUM CHLORIDE 0.9 % IV SOLN: INTRAVENOUS | Status: DC

## 2021-02-12 ENCOUNTER — Inpatient Hospital Stay: Payer: 59

## 2021-02-12 VITALS — BP 124/72 | HR 74 | Temp 98.3°F | Resp 16

## 2021-02-12 DIAGNOSIS — C50412 Malignant neoplasm of upper-outer quadrant of left female breast: Secondary | ICD-10-CM

## 2021-02-12 DIAGNOSIS — L03818 Cellulitis of other sites: Secondary | ICD-10-CM

## 2021-02-12 DIAGNOSIS — Z170 Estrogen receptor positive status [ER+]: Secondary | ICD-10-CM

## 2021-02-12 DIAGNOSIS — N61 Mastitis without abscess: Secondary | ICD-10-CM | POA: Diagnosis not present

## 2021-02-12 MED ORDER — VANCOMYCIN HCL 1000 MG IV SOLR
1000.0000 mg | INTRAVENOUS | Status: DC
  Administered 2021-02-12: 1000 mg via INTRAVENOUS
  Filled 2021-02-12: qty 20

## 2021-02-12 MED ORDER — SODIUM CHLORIDE 0.9 % IV SOLN: Freq: Once | INTRAVENOUS | Status: AC

## 2021-02-12 MED ORDER — DEXTROSE 5 % IV SOLN
2.0000 g | INTRAVENOUS | Status: DC
  Administered 2021-02-12: 2 g via INTRAVENOUS
  Filled 2021-02-12: qty 20

## 2021-02-12 NOTE — Patient Instructions (Signed)
Vancomycin injection What is this medication? VANCOMYCIN Lucianne Lei koe MYE sin) is a glycopeptide antibiotic. It is used to treat certain kinds of bacterial infections. It will not work for colds, flu, or other viral infections. This medicine may be used for other purposes; ask your health care provider or pharmacist if you have questions. COMMON BRAND NAME(S): Glo Herring What should I tell my care team before I take this medication? They need to know if you have any of these conditions: dehydration hearing loss kidney disease other chronic illness an unusual or allergic reaction to vancomycin, other medicines, foods, dyes, or preservatives pregnant or trying to get pregnant breast-feeding How should I use this medication? This medicine is infused into a vein. It is usually given by a health care provider in a hospital or clinic. If you receive this medicine at home, you will receive special instructions. Take your medicine at regular intervals. Do not take your medicine more often than directed. Take all of your medicine as directed even if you think you are better. Do not skip doses or stop your medicine early. It is important that you put your used needles and syringes in a special sharps container. Do not put them in a trash can. If you do not have a sharps container, call your pharmacist or healthcare provider to get one. Talk to your pediatrician regarding the use of this medicine in children. While this drug may be prescribed for even very young infants for selected conditions, precautions do apply. Overdosage: If you think you have taken too much of this medicine contact a poison control center or emergency room at once. NOTE: This medicine is only for you. Do not share this medicine with others. What if I miss a dose? If you miss a dose, take it as soon as you can. If it is almost time for your next dose, take only that dose. Do not take double or extra doses. What may interact  with this medication? amphotericin B anesthetics bacitracin birth control pills cisplatin colistin diuretics other aminoglycoside antibiotics polymyxin B This list may not describe all possible interactions. Give your health care provider a list of all the medicines, herbs, non-prescription drugs, or dietary supplements you use. Also tell them if you smoke, drink alcohol, or use illegal drugs. Some items may interact with your medicine. What should I watch for while using this medication? Tell your doctor or health care provider if your symptoms do not improve or if you get new symptoms. Your condition and lab work will be monitored while you are taking this medicine. Do not treat diarrhea with over the counter products. Contact your doctor if you have diarrhea that lasts more than 2 days or if it is severe and watery. This medicine may cause serious skin reactions. They can happen weeks to months after starting the medicine. Contact your health care provider right away if you notice fevers or flu-like symptoms with a rash. The rash may be red or purple and then turn into blisters or peeling of the skin. Or, you might notice a red rash with swelling of the face, lips or lymph nodes in your neck or under your arms. What side effects may I notice from receiving this medication? Side effects that you should report to your doctor or health care professional as soon as possible: allergic reactions like skin rash, itching or hives, swelling of the face, lips, or tongue breathing difficulty, wheezing change in amount, color of urine change in hearing chest  pain dizziness fever, chills flushing of the face and neck (reddening) low blood pressure rash, fever, and swollen lymph nodes redness, blistering, peeling or loosening of the skin, including inside the mouth unusual bleeding or bruising unusually weak or tired Side effects that usually do not require medical attention (report to your doctor  or health care professional if they continue or are bothersome): nausea, vomiting pain, swelling where injected stomach cramps This list may not describe all possible side effects. Call your doctor for medical advice about side effects. You may report side effects to FDA at 1-800-FDA-1088. Where should I keep my medication? Keep out of the reach of children. You will be instructed on how to store this medicine, if needed. Throw away any unused medicine after the expiration date on the label. NOTE: This sheet is a summary. It may not cover all possible information. If you have questions about this medicine, talk to your doctor, pharmacist, or health care provider.  2022 Elsevier/Gold Standard (2018-07-22 16:14:12)  Ceftriaxone Injection What is this medication? CEFTRIAXONE (sef try AX one) treats infections caused by bacteria. It belongs to a group of medications called cephalosporin antibiotics. It will not treat colds, the flu, or infections caused by viruses. This medicine may be used for other purposes; ask your health care provider or pharmacist if you have questions. COMMON BRAND NAME(S): Ceftrisol Plus, Rocephin What should I tell my care team before I take this medication? They need to know if you have any of these conditions: Bleeding disorder High bilirubin level in newborn patients Kidney disease Liver disease Poor nutrition An unusual or allergic reaction to ceftriaxone, other penicillin or cephalosporin antibiotics, other medicines, foods, dyes, or preservatives Pregnant or trying to get pregnant Breast-feeding How should I use this medication? This medication is injected into a vein or into a muscle. It is usually given by a health care provider in a hospital or clinic setting. It may also be given at home. If you get this medication at home, you will be taught how to prepare and give it. Use exactly as directed. Take it as directed on the prescription label at the same time  every day. Take all of this medication unless your care team tells you to stop it early. Keep taking it even if you think you are better. It is important that you put your used needles and syringes in a special sharps container. Do not put them in a trash can. If you do not have a sharps container, call your care team to get one. Talk to your care team about the use of this medication in children. While it may be prescribed for children as young as newborns for selected conditions, precautions do apply. Overdosage: If you think you have taken too much of this medicine contact a poison control center or emergency room at once. NOTE: This medicine is only for you. Do not share this medicine with others. What if I miss a dose? If you get this medication at the hospital or clinic: It is important not to miss your dose. Call your care team if you are unable to keep an appointment. If you give yourself this medication at home: If you miss a dose, take it as soon as you can. Then continue your normal schedule. If it is almost time for your next dose, take only that dose. Do not take double or extra doses. Call your care team with questions. What may interact with this medication? Birth control pills Intravenous calcium  This list may not describe all possible interactions. Give your health care provider a list of all the medicines, herbs, non-prescription drugs, or dietary supplements you use. Also tell them if you smoke, drink alcohol, or use illegal drugs. Some items may interact with your medicine. What should I watch for while using this medication? Tell your care team if your symptoms do not start to get better or if they get worse. Do not treat diarrhea with over the counter products. Contact your care team if you have diarrhea that lasts more than 2 days or if it is severe and watery. If you have diabetes, you may get a false-positive result for sugar in your urine. Check with your care team. If you  are being treated for a sexually transmitted disease (STD), avoid sexual contact until you have finished your treatment. Your sexual partner may also need treatment. What side effects may I notice from receiving this medication? Side effects that you should report to your care team as soon as possible: Allergic reactions-skin rash, itching, hives, swelling of the face, lips, tongue, or throat Confusion Drowsiness Gallbladder problems-severe stomach pain, nausea, vomiting, fever Kidney injury-decrease in the amount of urine, swelling of the ankles, hands, or feet Kidney stones-blood in the urine, pain or trouble passing urine, pain in the lower back or sides Low red blood cell count-unusual weakness or fatigue, dizziness, headache, trouble breathing Pancreatitis-severe stomach pain that spreads to your back or gets worse after eating or when touched, fever, nausea, vomiting Seizures Severe diarrhea, fever Unusual weakness or fatigue Side effects that usually do not require medical attention (report to your care team if they continue or are bothersome): Diarrhea This list may not describe all possible side effects. Call your doctor for medical advice about side effects. You may report side effects to FDA at 1-800-FDA-1088. Where should I keep my medication? Keep out of the reach of children and pets. You will be instructed on how to store this medication. Get rid of any unused medication after the expiration date. To get rid of medications that are no longer needed or have expired: Take the medication to a medication take-back program. Check with your pharmacy or law enforcement to find a location. If you cannot return the medication, ask your care team how to get rid of this medication safely. NOTE: This sheet is a summary. It may not cover all possible information. If you have questions about this medicine, talk to your doctor, pharmacist, or health care provider.  2022 Elsevier/Gold  Standard (2020-05-21 09:56:16)

## 2021-02-13 ENCOUNTER — Other Ambulatory Visit (HOSPITAL_BASED_OUTPATIENT_CLINIC_OR_DEPARTMENT_OTHER): Payer: Self-pay

## 2021-02-13 ENCOUNTER — Inpatient Hospital Stay: Payer: 59

## 2021-02-13 ENCOUNTER — Other Ambulatory Visit: Payer: Self-pay | Admitting: *Deleted

## 2021-02-13 ENCOUNTER — Encounter (HOSPITAL_BASED_OUTPATIENT_CLINIC_OR_DEPARTMENT_OTHER): Payer: Self-pay | Admitting: Pharmacist

## 2021-02-13 ENCOUNTER — Inpatient Hospital Stay (HOSPITAL_BASED_OUTPATIENT_CLINIC_OR_DEPARTMENT_OTHER): Payer: 59 | Admitting: Oncology

## 2021-02-13 ENCOUNTER — Other Ambulatory Visit: Payer: Self-pay

## 2021-02-13 VITALS — BP 118/69 | HR 73 | Temp 98.1°F | Resp 16

## 2021-02-13 DIAGNOSIS — N61 Mastitis without abscess: Secondary | ICD-10-CM | POA: Diagnosis not present

## 2021-02-13 DIAGNOSIS — C50412 Malignant neoplasm of upper-outer quadrant of left female breast: Secondary | ICD-10-CM | POA: Diagnosis not present

## 2021-02-13 DIAGNOSIS — Z170 Estrogen receptor positive status [ER+]: Secondary | ICD-10-CM

## 2021-02-13 DIAGNOSIS — L03818 Cellulitis of other sites: Secondary | ICD-10-CM

## 2021-02-13 LAB — CBC WITH DIFFERENTIAL (CANCER CENTER ONLY)
Abs Immature Granulocytes: 0.04 10*3/uL (ref 0.00–0.07)
Basophils Absolute: 0 10*3/uL (ref 0.0–0.1)
Basophils Relative: 1 %
Eosinophils Absolute: 0.1 10*3/uL (ref 0.0–0.5)
Eosinophils Relative: 2 %
HCT: 36.9 % (ref 36.0–46.0)
Hemoglobin: 12.2 g/dL (ref 12.0–15.0)
Immature Granulocytes: 1 %
Lymphocytes Relative: 23 %
Lymphs Abs: 1.7 10*3/uL (ref 0.7–4.0)
MCH: 30.6 pg (ref 26.0–34.0)
MCHC: 33.1 g/dL (ref 30.0–36.0)
MCV: 92.5 fL (ref 80.0–100.0)
Monocytes Absolute: 0.6 10*3/uL (ref 0.1–1.0)
Monocytes Relative: 8 %
Neutro Abs: 4.9 10*3/uL (ref 1.7–7.7)
Neutrophils Relative %: 65 %
Platelet Count: 397 10*3/uL (ref 150–400)
RBC: 3.99 MIL/uL (ref 3.87–5.11)
RDW: 11.7 % (ref 11.5–15.5)
WBC Count: 7.3 10*3/uL (ref 4.0–10.5)
nRBC: 0 % (ref 0.0–0.2)

## 2021-02-13 LAB — BASIC METABOLIC PANEL - CANCER CENTER ONLY
Anion gap: 10 (ref 5–15)
BUN: 16 mg/dL (ref 6–20)
CO2: 28 mmol/L (ref 22–32)
Calcium: 9 mg/dL (ref 8.9–10.3)
Chloride: 106 mmol/L (ref 98–111)
Creatinine: 0.77 mg/dL (ref 0.44–1.00)
GFR, Estimated: >60 mL/min (ref >60)
Glucose, Bld: 55 mg/dL — ABNORMAL LOW (ref 70–99)
Potassium: 3.7 mmol/L (ref 3.5–5.1)
Sodium: 144 mmol/L (ref 135–145)

## 2021-02-13 MED ORDER — CHOLESTYRAMINE 4 G PO PACK
4.0000 g | PACK | Freq: Two times a day (BID) | ORAL | 1 refills | Status: DC
  Filled 2021-02-13 – 2021-02-14 (×2): qty 12, 6d supply, fill #0

## 2021-02-13 MED ORDER — SODIUM CHLORIDE 0.9 % IV SOLN: Freq: Once | INTRAVENOUS | Status: AC

## 2021-02-13 MED ORDER — DEXTROSE 5 % IV SOLN
2.0000 g | Freq: Once | INTRAVENOUS | Status: AC
  Administered 2021-02-13: 2 g via INTRAVENOUS
  Filled 2021-02-13: qty 20

## 2021-02-13 MED ORDER — VANCOMYCIN HCL 1000 MG IV SOLR
1000.0000 mg | Freq: Once | INTRAVENOUS | Status: AC
  Administered 2021-02-13: 1000 mg via INTRAVENOUS
  Filled 2021-02-13: qty 20

## 2021-02-13 MED ORDER — SODIUM CHLORIDE 0.9 % IV SOLN: INTRAVENOUS | Status: AC

## 2021-02-13 NOTE — Patient Instructions (Addendum)
Ceftriaxone Injection What is this medication? CEFTRIAXONE (sef try AX one) treats infections caused by bacteria. It belongs to a group of medications called cephalosporin antibiotics. It will not treat colds, the flu, or infections caused by viruses. This medicine may be used for other purposes; ask your health care provider or pharmacist if you have questions. COMMON BRAND NAME(S): Ceftrisol Plus, Rocephin What should I tell my care team before I take this medication? They need to know if you have any of these conditions: Bleeding disorder High bilirubin level in newborn patients Kidney disease Liver disease Poor nutrition An unusual or allergic reaction to ceftriaxone, other penicillin or cephalosporin antibiotics, other medicines, foods, dyes, or preservatives Pregnant or trying to get pregnant Breast-feeding How should I use this medication? This medication is injected into a vein or into a muscle. It is usually given by a health care provider in a hospital or clinic setting. It may also be given at home. If you get this medication at home, you will be taught how to prepare and give it. Use exactly as directed. Take it as directed on the prescription label at the same time every day. Take all of this medication unless your care team tells you to stop it early. Keep taking it even if you think you are better. It is important that you put your used needles and syringes in a special sharps container. Do not put them in a trash can. If you do not have a sharps container, call your care team to get one. Talk to your care team about the use of this medication in children. While it may be prescribed for children as young as newborns for selected conditions, precautions do apply. Overdosage: If you think you have taken too much of this medicine contact a poison control center or emergency room at once. NOTE: This medicine is only for you. Do not share this medicine with others. What if I miss a  dose? If you get this medication at the hospital or clinic: It is important not to miss your dose. Call your care team if you are unable to keep an appointment. If you give yourself this medication at home: If you miss a dose, take it as soon as you can. Then continue your normal schedule. If it is almost time for your next dose, take only that dose. Do not take double or extra doses. Call your care team with questions. What may interact with this medication? Birth control pills Intravenous calcium This list may not describe all possible interactions. Give your health care provider a list of all the medicines, herbs, non-prescription drugs, or dietary supplements you use. Also tell them if you smoke, drink alcohol, or use illegal drugs. Some items may interact with your medicine. What should I watch for while using this medication? Tell your care team if your symptoms do not start to get better or if they get worse. Do not treat diarrhea with over the counter products. Contact your care team if you have diarrhea that lasts more than 2 days or if it is severe and watery. If you have diabetes, you may get a false-positive result for sugar in your urine. Check with your care team. If you are being treated for a sexually transmitted disease (STD), avoid sexual contact until you have finished your treatment. Your sexual partner may also need treatment. What side effects may I notice from receiving this medication? Side effects that you should report to your care team as soon  as possible: Allergic reactions-skin rash, itching, hives, swelling of the face, lips, tongue, or throat Confusion Drowsiness Gallbladder problems-severe stomach pain, nausea, vomiting, fever Kidney injury-decrease in the amount of urine, swelling of the ankles, hands, or feet Kidney stones-blood in the urine, pain or trouble passing urine, pain in the lower back or sides Low red blood cell count-unusual weakness or fatigue,  dizziness, headache, trouble breathing Pancreatitis-severe stomach pain that spreads to your back or gets worse after eating or when touched, fever, nausea, vomiting Seizures Severe diarrhea, fever Unusual weakness or fatigue Side effects that usually do not require medical attention (report to your care team if they continue or are bothersome): Diarrhea This list may not describe all possible side effects. Call your doctor for medical advice about side effects. You may report side effects to FDA at 1-800-FDA-1088. Where should I keep my medication? Keep out of the reach of children and pets. You will be instructed on how to store this medication. Get rid of any unused medication after the expiration date. To get rid of medications that are no longer needed or have expired: Take the medication to a medication take-back program. Check with your pharmacy or law enforcement to find a location. If you cannot return the medication, ask your care team how to get rid of this medication safely. NOTE: This sheet is a summary. It may not cover all possible information. If you have questions about this medicine, talk to your doctor, pharmacist, or health care provider.  2022 Elsevier/Gold Standard (2020-05-21 09:56:16)  Vancomycin injection What is this medication? VANCOMYCIN Lucianne Lei koe MYE sin) is a glycopeptide antibiotic. It is used to treat certain kinds of bacterial infections. It will not work for colds, flu, or other viral infections. This medicine may be used for other purposes; ask your health care provider or pharmacist if you have questions. COMMON BRAND NAME(S): Glo Herring What should I tell my care team before I take this medication? They need to know if you have any of these conditions: dehydration hearing loss kidney disease other chronic illness an unusual or allergic reaction to vancomycin, other medicines, foods, dyes, or preservatives pregnant or trying to get  pregnant breast-feeding How should I use this medication? This medicine is infused into a vein. It is usually given by a health care provider in a hospital or clinic. If you receive this medicine at home, you will receive special instructions. Take your medicine at regular intervals. Do not take your medicine more often than directed. Take all of your medicine as directed even if you think you are better. Do not skip doses or stop your medicine early. It is important that you put your used needles and syringes in a special sharps container. Do not put them in a trash can. If you do not have a sharps container, call your pharmacist or healthcare provider to get one. Talk to your pediatrician regarding the use of this medicine in children. While this drug may be prescribed for even very young infants for selected conditions, precautions do apply. Overdosage: If you think you have taken too much of this medicine contact a poison control center or emergency room at once. NOTE: This medicine is only for you. Do not share this medicine with others. What if I miss a dose? If you miss a dose, take it as soon as you can. If it is almost time for your next dose, take only that dose. Do not take double or extra doses. What may interact  with this medication? amphotericin B anesthetics bacitracin birth control pills cisplatin colistin diuretics other aminoglycoside antibiotics polymyxin B This list may not describe all possible interactions. Give your health care provider a list of all the medicines, herbs, non-prescription drugs, or dietary supplements you use. Also tell them if you smoke, drink alcohol, or use illegal drugs. Some items may interact with your medicine. What should I watch for while using this medication? Tell your doctor or health care provider if your symptoms do not improve or if you get new symptoms. Your condition and lab work will be monitored while you are taking this medicine. Do  not treat diarrhea with over the counter products. Contact your doctor if you have diarrhea that lasts more than 2 days or if it is severe and watery. This medicine may cause serious skin reactions. They can happen weeks to months after starting the medicine. Contact your health care provider right away if you notice fevers or flu-like symptoms with a rash. The rash may be red or purple and then turn into blisters or peeling of the skin. Or, you might notice a red rash with swelling of the face, lips or lymph nodes in your neck or under your arms. What side effects may I notice from receiving this medication? Side effects that you should report to your doctor or health care professional as soon as possible: allergic reactions like skin rash, itching or hives, swelling of the face, lips, or tongue breathing difficulty, wheezing change in amount, color of urine change in hearing chest pain dizziness fever, chills flushing of the face and neck (reddening) low blood pressure rash, fever, and swollen lymph nodes redness, blistering, peeling or loosening of the skin, including inside the mouth unusual bleeding or bruising unusually weak or tired Side effects that usually do not require medical attention (report to your doctor or health care professional if they continue or are bothersome): nausea, vomiting pain, swelling where injected stomach cramps This list may not describe all possible side effects. Call your doctor for medical advice about side effects. You may report side effects to FDA at 1-800-FDA-1088. Where should I keep my medication? Keep out of the reach of children. You will be instructed on how to store this medicine, if needed. Throw away any unused medicine after the expiration date on the label. NOTE: This sheet is a summary. It may not cover all possible information. If you have questions about this medicine, talk to your doctor, pharmacist, or health care provider.  2022  Elsevier/Gold Standard (2018-07-22 16:14:12)  Rehydration, Adult Rehydration is the replacement of body fluids, salts, and minerals (electrolytes) that are lost during dehydration. Dehydration is when there is not enough water or other fluids in the body. This happens when you lose more fluids than you take in. Common causes of dehydration include: Not drinking enough fluids. This can occur when you are ill or doing activities that require a lot of energy, especially in hot weather. Conditions that cause loss of water or other fluids, such as diarrhea, vomiting, sweating, or urinating a lot. Other illnesses, such as fever or infection. Certain medicines, such as those that remove excess fluid from the body (diuretics). Symptoms of mild or moderate dehydration may include thirst, dry lips and mouth, and dizziness. Symptoms of severe dehydration may include increased heart rate, confusion, fainting, and not urinating. For severe dehydration, you may need to get fluids through an IV at the hospital. For mild or moderate dehydration, you can usually rehydrate  at home by drinking certain fluids as told by your health care provider. What are the risks? Generally, rehydration is safe. However, taking in too much fluid (overhydration) can be a problem. This is rare. Overhydration can cause an electrolyte imbalance, kidney failure, or a decrease in salt (sodium) levels in the body. Supplies needed You will need an oral rehydration solution (ORS) if your health care provider tells you to use one. This is a drink to treat dehydration. It can be found in pharmacies and retail stores. How to rehydrate Fluids Follow instructions from your health care provider for rehydration. The kind of fluid and the amount you should drink depend on your condition. In general, you should choose drinks that you prefer. If told by your health care provider, drink an ORS. Make an ORS by following instructions on the  package. Start by drinking small amounts, about  cup (120 mL) every 5-10 minutes. Slowly increase how much you drink until you have taken the amount recommended by your health care provider. Drink enough clear fluids to keep your urine pale yellow. If you were told to drink an ORS, finish it first, then start slowly drinking other clear fluids. Drink fluids such as: Water. This includes sparkling water and flavored water. Drinking only water can lead to having too little sodium in your body (hyponatremia). Follow the advice of your health care provider. Water from ice chips you suck on. Fruit juice with water you add to it (diluted). Sports drinks. Hot or cold herbal teas. Broth-based soups. Milk or milk products. Food Follow instructions from your health care provider about what to eat while you rehydrate. Your health care provider may recommend that you slowly begin eating regular foods in small amounts. Eat foods that contain a healthy balance of electrolytes, such as bananas, oranges, potatoes, tomatoes, and spinach. Avoid foods that are greasy or contain a lot of sugar. In some cases, you may get nutrition through a feeding tube that is passed through your nose and into your stomach (nasogastric tube, or NG tube). This may be done if you have uncontrolled vomiting or diarrhea. Beverages to avoid Certain beverages may make dehydration worse. While you rehydrate, avoid drinking alcohol. How to tell if you are recovering from dehydration You may be recovering from dehydration if: You are urinating more often than before you started rehydrating. Your urine is pale yellow. Your energy level improves. You vomit less frequently. You have diarrhea less frequently. Your appetite improves or returns to normal. You feel less dizzy or less light-headed. Your skin tone and color start to look more normal. Follow these instructions at home: Take over-the-counter and prescription medicines only  as told by your health care provider. Do not take sodium tablets. Doing this can lead to having too much sodium in your body (hypernatremia). Contact a health care provider if: You continue to have symptoms of mild or moderate dehydration, such as: Thirst. Dry lips. Slightly dry mouth. Dizziness. Dark urine or less urine than normal. Muscle cramps. You continue to vomit or have diarrhea. Get help right away if you: Have symptoms of dehydration that get worse. Have a fever. Have a severe headache. Have been vomiting and the following happens: Your vomiting gets worse or does not go away. Your vomit includes blood or green matter (bile). You cannot eat or drink without vomiting. Have problems with urination or bowel movements, such as: Diarrhea that gets worse or does not go away. Blood in your stool (feces). This may  cause stool to look black and tarry. Not urinating, or urinating only a small amount of very dark urine, within 6-8 hours. Have trouble breathing. Have symptoms that get worse with treatment. These symptoms may represent a serious problem that is an emergency. Do not wait to see if the symptoms will go away. Get medical help right away. Call your local emergency services (911 in the [place] Do not drive yourself to the hospital. Summary Rehydration is the replacement of body fluids and minerals (electrolytes) that are lost during dehydration. Follow instructions from your health care provider for rehydration. The kind of fluid and amount you should drink depend on your condition. Slowly increase how much you drink until you have taken the amount recommended by your health care provider. Contact your health care provider if you continue to show signs of mild or moderate dehydration. This information is not intended to replace advice given to you by your health care provider. Make sure you discuss any questions you have with your health care provider. Document Revised:  06/14/2019 Document Reviewed: 04/24/2019 Elsevier Patient Education  2022 [employer].

## 2021-02-13 NOTE — Progress Notes (Signed)
[facility] Follow up:    Stephanie Noon, MD [place] [id number]   DIAGNOSIS: Cancer Staging Malignant neoplasm of upper-outer quadrant of left breast in female, estrogen receptor positive ([place]) Staging form: Breast, AJCC 8th Edition - Clinical stage from 01/06/2019: Stage 0 (cTis (DCIS), cN0, cM0, ER+, PR+) - Signed by Gardenia Phlegm, NP on 01/18/2019 Stage prefix: Initial diagnosis - Pathologic stage from 02/09/2019: Stage 0 (pTis (DCIS), pN0, cM0, ER+, PR+) - Signed by Gardenia Phlegm, NP on 03/01/2019 Stage prefix: Initial diagnosis   SUMMARY OF ONCOLOGIC HISTORY:  [place] woman status post left breast biopsy 01/06/2019 ductal carcinoma in situ, grade 3, estrogen and progesterone receptor positive             (a) biopsy of a second area of calcifications in the left breast on 01/17/2019 -   (1) status post left lumpectomy 02/09/2019 for a 2.4 cm ductal carcinoma in situ, grade 2, with negative margins   (2) adjuvant radiation 03/13/2019 - 04/11/2019             (a) left breast / 42.56 Gy in 16 fractions             (b) boost / 8 Gy in 4 fractions   (3) tamoxifen started 04/28/2019   (4) genetics testing 01/19/2019 through the [facility] Breast Cancer STAT Panel + Common Hereditary Cancers Panel found no deleterious mutations in ATM, BRCA1, BRCA2, CDH1, CHEK2, PALB2, PTEN, STK11 and TP53 // APC, ATM, AXIN2, BARD1, BMPR1A, BRCA1, BRCA2, BRIP1, CDH1, CDKN2A (p14ARF), CDKN2A (p16INK4a), CKD4, CHEK2, CTNNA1, DICER1, EPCAM (Deletion/duplication testing only), GREM1 (promoter region deletion/duplication testing only), KIT, MEN1, MLH1, MSH2, MSH3, MSH6, MUTYH, NBN, NF1, NHTL1, PALB2, PDGFRA, PMS2, POLD1, POLE, PTEN, RAD50, RAD51C, RAD51D, SDHB, SDHC, SDHD, SMAD4, SMARCA4. STK11, TP53, TSC1, TSC2, and VHL.  The following genes were evaluated for sequence changes only: SDHA and HOXB13 c.251G>A variant only.  CURRENT THERAPY:  tamoxifen  INTERVAL HISTORY: Stephanie [name] 55 y.o. female noted some redness and tenderness in the left breast developing late September.  She saw her gynecologist and was started on amoxicillin.  However that did not help.  She then was placed on clindamycin which she took for 10 days with some improvement.  After completing that course however the problem recurred.  She saw Dr. Donne Hazel 02/05/2021 and he noted low-grade fevers and redness and he started her on ceftriaxone and vancomycin intravenously which she received at the cancer center.  As she is receiving her antipenultimate dose today.  Review of systems: She tells me her breast as pretty much cleared up.  She has however developed a diarrhea.  She feels tired.  She denies fever at present.  She is able to keep her self hydrated.  A detailed review of systems was otherwise noncontributory  Patient Active Problem List   Diagnosis Date Noted   Malignant neoplasm of upper-outer quadrant of left breast in female, estrogen receptor positive ([place]) 01/16/2019    Priority: 1.   Osteoporosis of lumbar spine 04/05/2012    Priority: 1.   Cellulitis of other sites 02/10/2021   Postmenopausal bleeding 50/56/9794   Lichen sclerosus et atrophicus 04/18/2019   Genetic testing 01/20/2019   TMJ pain dysfunction syndrome 05/26/2018   Achilles tendinosis of left lower extremity 11/30/2016   Depression, major 01/10/2013   Generalized anxiety disorder 01/10/2013   Attention deficit disorder without mention of hyperactivity 04/05/2012   Tendonitis of wrist, right 02/05/2011  is allergic to wellbutrin xl [bupropion].  MEDICAL HISTORY: Past Medical History:  Diagnosis Date   Anxiety    Breast mass, left    Cancer ([place])    Complication of anesthesia    felt lethargic x 4 days following last surgery in 2017, but 2018 did okay   Depression    Headache    migraines   History of radiation therapy 03/13/19- 04/11/19   Left Breast 16 fraction of  2. 66 Gy each to total 42.56 Gy. Left breast boost 4 fractions of 2 Gy to total 8 Gy.    Osteopenia     SURGICAL HISTORY: Past Surgical History:  Procedure Laterality Date   BREAST BIOPSY Left 07/10/2015   high risk stereo   BREAST EXCISIONAL BIOPSY Left 08/15/2015   ADH   BREAST LUMPECTOMY WITH RADIOACTIVE SEED LOCALIZATION Left 08/15/2015   Procedure:  LEFT BREAST LUMPECTOMY WITH RADIOACTIVE SEED LOCALIZATION;  Surgeon: Erroll Luna, MD;  Location: [facility];  Service: General;  Laterality: Left;   BREAST LUMPECTOMY WITH RADIOACTIVE SEED LOCALIZATION Left 02/09/2019   Procedure: LEFT BREAST LUMPECTOMY WITH RADIOACTIVE SEED LOCALIZATION;  Surgeon: [name], MD;  Location: [facility];  Service: General;  Laterality: Left;   EXCISION OF BREAST BIOPSY Left 12/30/2016   Benign ALH sclerosing lesion   RADIOACTIVE SEED GUIDED EXCISIONAL BREAST BIOPSY Left 12/30/2016   Procedure: RADIOACTIVE SEED GUIDED EXCISIONAL LEFT BREAST BIOPSY;  Surgeon: [name], MD;  Location: [facility];  Service: General;  Laterality: Left;   RADIOACTIVE SEED GUIDED EXCISIONAL BREAST BIOPSY Left 02/09/2019   Procedure: RADIOACTIVE SEED GUIDED EXCISIONAL LEFT  BREAST BIOPSY;  Surgeon: [name], MD;  Location: [facility];  Service: General;  Laterality: Left;   TONSILLECTOMY  Age 89   TRIGGER FINGER RELEASE  2010   Right Thumb    SOCIAL HISTORY: Social History   Socioeconomic History   Marital status: Married    Spouse name: Not on file   Number of children: Not on file   Years of education: Not on file   Highest education level: Not on file  Occupational History   Occupation: RN    Employer: [employer]  Tobacco Use   Smoking status: Never   Smokeless tobacco: Never  Vaping Use   Vaping Use: Never used  Substance and Sexual Activity   Alcohol use: No    Comment: rare use   Drug use: No   Sexual activity: Yes    Partners: Male    Birth  control/protection: Post-menopausal  Other Topics Concern   Not on file  Social History Narrative   04/05/12 AM he was born and grew up in [place]. She has 3 older brothers. She never suffered any abuse. Her father died when she was 45 years of age. She completed her bachelor of science in nursing in 1989, and has been working as an Therapist, sports . She has been married for 21 years, and she and her husband have a 68 year old son and a 75 year old daughter. She affiliates as Psychologist, forensic. She denies any legal involvement. She enjoys hiking and reading. She reports her social support consists of friends from college, and a friend from church. 04/05/2012   Social Determinants of Health   Financial Resource Strain: Not on file  Food Insecurity: Not on file  Transportation Needs: Not on file  Physical Activity: Not on file  Stress: Not on file  Social Connections: Not on file  Intimate Partner Violence: Not on file  FAMILY HISTORY: Family History  Problem Relation Age of Onset   Depression Brother    Atrial fibrillation Brother    Hypertension Mother    Osteoporosis Mother    ADD / ADHD Son    ADD / ADHD Daughter    Diabetes Maternal Grandmother    Diabetes Maternal Grandfather    Diabetes Paternal Grandmother    Diabetes Paternal Grandfather    Parkinson's disease Maternal Aunt       PHYSICAL EXAMINATION  ECOG PERFORMANCE STATUS: 1 - Symptomatic but completely ambulatory  There were no vitals filed for this visit.  For vitals associated with the 02/13/2021 visit please see the infusion area flowsheet  Stephanie [name] was evaluated in the infusion area.  Left breast has cleared, with no erythema, swelling or tenderness   LABORATORY DATA:  CBC    Component Value Date/Time   WBC 7.3 02/13/2021 1024   WBC 6.7 02/02/2019 1000   RBC 3.99 02/13/2021 1024   HGB 12.2 02/13/2021 1024   HGB 13.4 10/24/2019 1114   HCT 36.9 02/13/2021 1024   HCT 39.8 10/24/2019 1114   PLT 397  02/13/2021 1024   PLT 332 10/24/2019 1114   MCV 92.5 02/13/2021 1024   MCV 90 10/24/2019 1114   MCH 30.6 02/13/2021 1024   MCHC 33.1 02/13/2021 1024   RDW 11.7 02/13/2021 1024   RDW 11.8 10/24/2019 1114   LYMPHSABS 1.7 02/13/2021 1024   MONOABS 0.6 02/13/2021 1024   EOSABS 0.1 02/13/2021 1024   BASOSABS 0.0 02/13/2021 1024    CMP     Component Value Date/Time   NA 144 02/13/2021 1024   NA 142 10/24/2019 1116   K 3.7 02/13/2021 1024   CL 106 02/13/2021 1024   CO2 28 02/13/2021 1024   GLUCOSE 55 (L) 02/13/2021 1024   BUN 16 02/13/2021 1024   BUN 11 10/24/2019 1116   CREATININE 0.77 02/13/2021 1024   CALCIUM 9.0 02/13/2021 1024   PROT 6.9 12/31/2020 1317   PROT 7.1 10/24/2019 1116   ALBUMIN 3.7 12/31/2020 1317   ALBUMIN 4.6 10/24/2019 1116   AST 47 (H) 12/31/2020 1317   ALT 33 12/31/2020 1317   ALKPHOS 73 12/31/2020 1317   BILITOT 0.3 12/31/2020 1317   GFRNONAA >60 02/13/2021 1024   GFRAA >60 01/24/2020 1033          ASSESSMENT and THERAPY PLAN:  Stephanie [name] is left breast cellulitis has cleared and there is clinically currently no evidence of infection.  She will complete her IV antibiotics 02/15/2021.  She has developed diarrhea.  I have prescribed Questran for her.  If the diarrhea persist we will need to check C. difficile as she is at high risk for that given her recent history.  Otherwise she is already scheduled for follow-up.  Mid November.  Total encounter time: 20 minutes  Wilber Bihari, NP 02/13/21 3:36 PM Medical Oncology and Hematology [facility] [place], [place] [address] Tel. [phone]    Fax. [phone]  *Total Encounter Time as defined by the Centers for Medicare and Medicaid Services includes, in addition to the face-to-face time of a patient visit (documented in the note above) non-face-to-face time: obtaining and reviewing outside history, ordering and reviewing medications, tests or procedures, care coordination  (communications with other health care professionals or caregivers) and documentation in the medical record.

## 2021-02-14 ENCOUNTER — Other Ambulatory Visit: Payer: Self-pay | Admitting: General Surgery

## 2021-02-14 ENCOUNTER — Other Ambulatory Visit (HOSPITAL_BASED_OUTPATIENT_CLINIC_OR_DEPARTMENT_OTHER): Payer: Self-pay

## 2021-02-14 ENCOUNTER — Other Ambulatory Visit (HOSPITAL_COMMUNITY): Payer: Self-pay | Admitting: General Surgery

## 2021-02-14 ENCOUNTER — Inpatient Hospital Stay: Payer: 59

## 2021-02-14 ENCOUNTER — Encounter: Payer: Self-pay | Admitting: Oncology

## 2021-02-14 VITALS — BP 113/68 | HR 81 | Temp 99.4°F | Resp 17

## 2021-02-14 DIAGNOSIS — Z170 Estrogen receptor positive status [ER+]: Secondary | ICD-10-CM | POA: Diagnosis not present

## 2021-02-14 DIAGNOSIS — L03818 Cellulitis of other sites: Secondary | ICD-10-CM

## 2021-02-14 DIAGNOSIS — C50412 Malignant neoplasm of upper-outer quadrant of left female breast: Secondary | ICD-10-CM | POA: Diagnosis not present

## 2021-02-14 DIAGNOSIS — N61 Mastitis without abscess: Secondary | ICD-10-CM | POA: Diagnosis not present

## 2021-02-14 MED ORDER — DOXYCYCLINE HYCLATE 100 MG PO CAPS
ORAL_CAPSULE | ORAL | 0 refills | Status: DC
  Filled 2021-02-14 (×2): qty 60, 30d supply, fill #0

## 2021-02-14 MED ORDER — VANCOMYCIN HCL 1000 MG IV SOLR
1000.0000 mg | Freq: Once | INTRAVENOUS | Status: AC
  Administered 2021-02-14: 1000 mg via INTRAVENOUS
  Filled 2021-02-14: qty 20

## 2021-02-14 MED ORDER — SODIUM CHLORIDE 0.9 % IV SOLN: Freq: Once | INTRAVENOUS | Status: AC

## 2021-02-14 MED ORDER — DEXTROSE 5 % IV SOLN
2.0000 g | INTRAVENOUS | Status: DC
  Filled 2021-02-14: qty 20

## 2021-02-14 MED ORDER — DEXTROSE 5 % IV SOLN
2.0000 g | Freq: Once | INTRAVENOUS | Status: AC
  Administered 2021-02-14: 2 g via INTRAVENOUS
  Filled 2021-02-14: qty 20

## 2021-02-14 MED FILL — Ceftriaxone Sodium For Inj 2 GM: INTRAMUSCULAR | Qty: 20 | Status: AC

## 2021-02-14 MED FILL — Vancomycin HCl For IV Soln 1 GM (Base Equivalent): INTRAVENOUS | Qty: 20 | Status: AC

## 2021-02-14 MED FILL — Tamoxifen Citrate Tab 20 MG (Base Equivalent): ORAL | 90 days supply | Qty: 90 | Fill #0 | Status: AC

## 2021-02-14 NOTE — Patient Instructions (Signed)
Ceftriaxone Injection What is this medication? CEFTRIAXONE (sef try AX one) treats infections caused by bacteria. It belongs to a group of medications called cephalosporin antibiotics. It will not treat colds, the flu, or infections caused by viruses. This medicine may be used for other purposes; ask your health care provider or pharmacist if you have questions. COMMON BRAND NAME(S): Ceftrisol Plus, Rocephin What should I tell my care team before I take this medication? They need to know if you have any of these conditions: Bleeding disorder High bilirubin level in newborn patients Kidney disease Liver disease Poor nutrition An unusual or allergic reaction to ceftriaxone, other penicillin or cephalosporin antibiotics, other medicines, foods, dyes, or preservatives Pregnant or trying to get pregnant Breast-feeding How should I use this medication? This medication is injected into a vein or into a muscle. It is usually given by a health care provider in a hospital or clinic setting. It may also be given at home. If you get this medication at home, you will be taught how to prepare and give it. Use exactly as directed. Take it as directed on the prescription label at the same time every day. Take all of this medication unless your care team tells you to stop it early. Keep taking it even if you think you are better. It is important that you put your used needles and syringes in a special sharps container. Do not put them in a trash can. If you do not have a sharps container, call your care team to get one. Talk to your care team about the use of this medication in children. While it may be prescribed for children as young as newborns for selected conditions, precautions do apply. Overdosage: If you think you have taken too much of this medicine contact a poison control center or emergency room at once. NOTE: This medicine is only for you. Do not share this medicine with others. What if I miss a  dose? If you get this medication at the hospital or clinic: It is important not to miss your dose. Call your care team if you are unable to keep an appointment. If you give yourself this medication at home: If you miss a dose, take it as soon as you can. Then continue your normal schedule. If it is almost time for your next dose, take only that dose. Do not take double or extra doses. Call your care team with questions. What may interact with this medication? Birth control pills Intravenous calcium This list may not describe all possible interactions. Give your health care provider a list of all the medicines, herbs, non-prescription drugs, or dietary supplements you use. Also tell them if you smoke, drink alcohol, or use illegal drugs. Some items may interact with your medicine. What should I watch for while using this medication? Tell your care team if your symptoms do not start to get better or if they get worse. Do not treat diarrhea with over the counter products. Contact your care team if you have diarrhea that lasts more than 2 days or if it is severe and watery. If you have diabetes, you may get a false-positive result for sugar in your urine. Check with your care team. If you are being treated for a sexually transmitted disease (STD), avoid sexual contact until you have finished your treatment. Your sexual partner may also need treatment. What side effects may I notice from receiving this medication? Side effects that you should report to your care team as soon  as possible: Allergic reactions-skin rash, itching, hives, swelling of the face, lips, tongue, or throat Confusion Drowsiness Gallbladder problems-severe stomach pain, nausea, vomiting, fever Kidney injury-decrease in the amount of urine, swelling of the ankles, hands, or feet Kidney stones-blood in the urine, pain or trouble passing urine, pain in the lower back or sides Low red blood cell count-unusual weakness or fatigue,  dizziness, headache, trouble breathing Pancreatitis-severe stomach pain that spreads to your back or gets worse after eating or when touched, fever, nausea, vomiting Seizures Severe diarrhea, fever Unusual weakness or fatigue Side effects that usually do not require medical attention (report to your care team if they continue or are bothersome): Diarrhea This list may not describe all possible side effects. Call your doctor for medical advice about side effects. You may report side effects to FDA at 1-800-FDA-1088. Where should I keep my medication? Keep out of the reach of children and pets. You will be instructed on how to store this medication. Get rid of any unused medication after the expiration date. To get rid of medications that are no longer needed or have expired: Take the medication to a medication take-back program. Check with your pharmacy or law enforcement to find a location. If you cannot return the medication, ask your care team how to get rid of this medication safely. NOTE: This sheet is a summary. It may not cover all possible information. If you have questions about this medicine, talk to your doctor, pharmacist, or health care provider.  2022 Elsevier/Gold Standard (2020-05-21 09:56:16)  Vancomycin injection What is this medication? VANCOMYCIN Lucianne Lei koe MYE sin) is a glycopeptide antibiotic. It is used to treat certain kinds of bacterial infections. It will not work for colds, flu, or other viral infections. This medicine may be used for other purposes; ask your health care provider or pharmacist if you have questions. COMMON BRAND NAME(S): Glo Herring What should I tell my care team before I take this medication? They need to know if you have any of these conditions: dehydration hearing loss kidney disease other chronic illness an unusual or allergic reaction to vancomycin, other medicines, foods, dyes, or preservatives pregnant or trying to get  pregnant breast-feeding How should I use this medication? This medicine is infused into a vein. It is usually given by a health care provider in a hospital or clinic. If you receive this medicine at home, you will receive special instructions. Take your medicine at regular intervals. Do not take your medicine more often than directed. Take all of your medicine as directed even if you think you are better. Do not skip doses or stop your medicine early. It is important that you put your used needles and syringes in a special sharps container. Do not put them in a trash can. If you do not have a sharps container, call your pharmacist or healthcare provider to get one. Talk to your pediatrician regarding the use of this medicine in children. While this drug may be prescribed for even very young infants for selected conditions, precautions do apply. Overdosage: If you think you have taken too much of this medicine contact a poison control center or emergency room at once. NOTE: This medicine is only for you. Do not share this medicine with others. What if I miss a dose? If you miss a dose, take it as soon as you can. If it is almost time for your next dose, take only that dose. Do not take double or extra doses. What may interact  with this medication? amphotericin B anesthetics bacitracin birth control pills cisplatin colistin diuretics other aminoglycoside antibiotics polymyxin B This list may not describe all possible interactions. Give your health care provider a list of all the medicines, herbs, non-prescription drugs, or dietary supplements you use. Also tell them if you smoke, drink alcohol, or use illegal drugs. Some items may interact with your medicine. What should I watch for while using this medication? Tell your doctor or health care provider if your symptoms do not improve or if you get new symptoms. Your condition and lab work will be monitored while you are taking this medicine. Do  not treat diarrhea with over the counter products. Contact your doctor if you have diarrhea that lasts more than 2 days or if it is severe and watery. This medicine may cause serious skin reactions. They can happen weeks to months after starting the medicine. Contact your health care provider right away if you notice fevers or flu-like symptoms with a rash. The rash may be red or purple and then turn into blisters or peeling of the skin. Or, you might notice a red rash with swelling of the face, lips or lymph nodes in your neck or under your arms. What side effects may I notice from receiving this medication? Side effects that you should report to your doctor or health care professional as soon as possible: allergic reactions like skin rash, itching or hives, swelling of the face, lips, or tongue breathing difficulty, wheezing change in amount, color of urine change in hearing chest pain dizziness fever, chills flushing of the face and neck (reddening) low blood pressure rash, fever, and swollen lymph nodes redness, blistering, peeling or loosening of the skin, including inside the mouth unusual bleeding or bruising unusually weak or tired Side effects that usually do not require medical attention (report to your doctor or health care professional if they continue or are bothersome): nausea, vomiting pain, swelling where injected stomach cramps This list may not describe all possible side effects. Call your doctor for medical advice about side effects. You may report side effects to FDA at 1-800-FDA-1088. Where should I keep my medication? Keep out of the reach of children. You will be instructed on how to store this medicine, if needed. Throw away any unused medicine after the expiration date on the label. NOTE: This sheet is a summary. It may not cover all possible information. If you have questions about this medicine, talk to your doctor, pharmacist, or health care provider.  2022  Elsevier/Gold Standard (2018-07-22 16:14:12)

## 2021-02-15 ENCOUNTER — Inpatient Hospital Stay: Payer: 59

## 2021-02-15 ENCOUNTER — Other Ambulatory Visit: Payer: Self-pay

## 2021-02-15 VITALS — BP 116/66 | HR 88 | Temp 99.4°F | Resp 20

## 2021-02-15 DIAGNOSIS — N61 Mastitis without abscess: Secondary | ICD-10-CM | POA: Diagnosis not present

## 2021-02-15 DIAGNOSIS — L03818 Cellulitis of other sites: Secondary | ICD-10-CM

## 2021-02-15 DIAGNOSIS — C50412 Malignant neoplasm of upper-outer quadrant of left female breast: Secondary | ICD-10-CM | POA: Diagnosis not present

## 2021-02-15 DIAGNOSIS — Z170 Estrogen receptor positive status [ER+]: Secondary | ICD-10-CM | POA: Diagnosis not present

## 2021-02-15 MED ORDER — VANCOMYCIN HCL 1000 MG IV SOLR
1000.0000 mg | INTRAVENOUS | Status: DC
  Administered 2021-02-15: 1000 mg via INTRAVENOUS
  Filled 2021-02-15: qty 20

## 2021-02-15 MED ORDER — SODIUM CHLORIDE 0.9 % IV SOLN: Freq: Once | INTRAVENOUS | Status: AC

## 2021-02-15 MED ORDER — DEXTROSE 5 % IV SOLN
2.0000 g | INTRAVENOUS | Status: DC
  Administered 2021-02-15: 2 g via INTRAVENOUS
  Filled 2021-02-15: qty 20

## 2021-02-15 NOTE — Patient Instructions (Addendum)
Vancomycin injection What is this medication? VANCOMYCIN Lucianne Lei koe MYE sin) is a glycopeptide antibiotic. It is used to treat certain kinds of bacterial infections. It will not work for colds, flu, or other viral infections. This medicine may be used for other purposes; ask your health care provider or pharmacist if you have questions. COMMON BRAND NAME(S): Glo Herring What should I tell my care team before I take this medication? They need to know if you have any of these conditions: dehydration hearing loss kidney disease other chronic illness an unusual or allergic reaction to vancomycin, other medicines, foods, dyes, or preservatives pregnant or trying to get pregnant breast-feeding How should I use this medication? This medicine is infused into a vein. It is usually given by a health care provider in a hospital or clinic. If you receive this medicine at home, you will receive special instructions. Take your medicine at regular intervals. Do not take your medicine more often than directed. Take all of your medicine as directed even if you think you are better. Do not skip doses or stop your medicine early. It is important that you put your used needles and syringes in a special sharps container. Do not put them in a trash can. If you do not have a sharps container, call your pharmacist or healthcare provider to get one. Talk to your pediatrician regarding the use of this medicine in children. While this drug may be prescribed for even very young infants for selected conditions, precautions do apply. Overdosage: If you think you have taken too much of this medicine contact a poison control center or emergency room at once. NOTE: This medicine is only for you. Do not share this medicine with others. What if I miss a dose? If you miss a dose, take it as soon as you can. If it is almost time for your next dose, take only that dose. Do not take double or extra doses. What may interact  with this medication? amphotericin B anesthetics bacitracin birth control pills cisplatin colistin diuretics other aminoglycoside antibiotics polymyxin B This list may not describe all possible interactions. Give your health care provider a list of all the medicines, herbs, non-prescription drugs, or dietary supplements you use. Also tell them if you smoke, drink alcohol, or use illegal drugs. Some items may interact with your medicine. What should I watch for while using this medication? Tell your doctor or health care provider if your symptoms do not improve or if you get new symptoms. Your condition and lab work will be monitored while you are taking this medicine. Do not treat diarrhea with over the counter products. Contact your doctor if you have diarrhea that lasts more than 2 days or if it is severe and watery. This medicine may cause serious skin reactions. They can happen weeks to months after starting the medicine. Contact your health care provider right away if you notice fevers or flu-like symptoms with a rash. The rash may be red or purple and then turn into blisters or peeling of the skin. Or, you might notice a red rash with swelling of the face, lips or lymph nodes in your neck or under your arms. What side effects may I notice from receiving this medication? Side effects that you should report to your doctor or health care professional as soon as possible: allergic reactions like skin rash, itching or hives, swelling of the face, lips, or tongue breathing difficulty, wheezing change in amount, color of urine change in hearing chest  pain dizziness fever, chills flushing of the face and neck (reddening) low blood pressure rash, fever, and swollen lymph nodes redness, blistering, peeling or loosening of the skin, including inside the mouth unusual bleeding or bruising unusually weak or tired Side effects that usually do not require medical attention (report to your doctor  or health care professional if they continue or are bothersome): nausea, vomiting pain, swelling where injected stomach cramps This list may not describe all possible side effects. Call your doctor for medical advice about side effects. You may report side effects to FDA at 1-800-FDA-1088. Where should I keep my medication? Keep out of the reach of children. You will be instructed on how to store this medicine, if needed. Throw away any unused medicine after the expiration date on the label. NOTE: This sheet is a summary. It may not cover all possible information. If you have questions about this medicine, talk to your doctor, pharmacist, or health care provider.  2022 Elsevier/Gold Standard (2018-07-22 16:14:12) Ceftriaxone Injection What is this medication? CEFTRIAXONE (sef try AX one) treats infections caused by bacteria. It belongs to a group of medications called cephalosporin antibiotics. It will not treat colds, the flu, or infections caused by viruses. This medicine may be used for other purposes; ask your health care provider or pharmacist if you have questions. COMMON BRAND NAME(S): Ceftrisol Plus, Rocephin What should I tell my care team before I take this medication? They need to know if you have any of these conditions: Bleeding disorder High bilirubin level in newborn patients Kidney disease Liver disease Poor nutrition An unusual or allergic reaction to ceftriaxone, other penicillin or cephalosporin antibiotics, other medicines, foods, dyes, or preservatives Pregnant or trying to get pregnant Breast-feeding How should I use this medication? This medication is injected into a vein or into a muscle. It is usually given by a health care provider in a hospital or clinic setting. It may also be given at home. If you get this medication at home, you will be taught how to prepare and give it. Use exactly as directed. Take it as directed on the prescription label at the same time  every day. Take all of this medication unless your care team tells you to stop it early. Keep taking it even if you think you are better. It is important that you put your used needles and syringes in a special sharps container. Do not put them in a trash can. If you do not have a sharps container, call your care team to get one. Talk to your care team about the use of this medication in children. While it may be prescribed for children as young as newborns for selected conditions, precautions do apply. Overdosage: If you think you have taken too much of this medicine contact a poison control center or emergency room at once. NOTE: This medicine is only for you. Do not share this medicine with others. What if I miss a dose? If you get this medication at the hospital or clinic: It is important not to miss your dose. Call your care team if you are unable to keep an appointment. If you give yourself this medication at home: If you miss a dose, take it as soon as you can. Then continue your normal schedule. If it is almost time for your next dose, take only that dose. Do not take double or extra doses. Call your care team with questions. What may interact with this medication? Birth control pills Intravenous calcium This  list may not describe all possible interactions. Give your health care provider a list of all the medicines, herbs, non-prescription drugs, or dietary supplements you use. Also tell them if you smoke, drink alcohol, or use illegal drugs. Some items may interact with your medicine. What should I watch for while using this medication? Tell your care team if your symptoms do not start to get better or if they get worse. Do not treat diarrhea with over the counter products. Contact your care team if you have diarrhea that lasts more than 2 days or if it is severe and watery. If you have diabetes, you may get a false-positive result for sugar in your urine. Check with your care team. If you  are being treated for a sexually transmitted disease (STD), avoid sexual contact until you have finished your treatment. Your sexual partner may also need treatment. What side effects may I notice from receiving this medication? Side effects that you should report to your care team as soon as possible: Allergic reactions-skin rash, itching, hives, swelling of the face, lips, tongue, or throat Confusion Drowsiness Gallbladder problems-severe stomach pain, nausea, vomiting, fever Kidney injury-decrease in the amount of urine, swelling of the ankles, hands, or feet Kidney stones-blood in the urine, pain or trouble passing urine, pain in the lower back or sides Low red blood cell count-unusual weakness or fatigue, dizziness, headache, trouble breathing Pancreatitis-severe stomach pain that spreads to your back or gets worse after eating or when touched, fever, nausea, vomiting Seizures Severe diarrhea, fever Unusual weakness or fatigue Side effects that usually do not require medical attention (report to your care team if they continue or are bothersome): Diarrhea This list may not describe all possible side effects. Call your doctor for medical advice about side effects. You may report side effects to FDA at 1-800-FDA-1088. Where should I keep my medication? Keep out of the reach of children and pets. You will be instructed on how to store this medication. Get rid of any unused medication after the expiration date. To get rid of medications that are no longer needed or have expired: Take the medication to a medication take-back program. Check with your pharmacy or law enforcement to find a location. If you cannot return the medication, ask your care team how to get rid of this medication safely. NOTE: This sheet is a summary. It may not cover all possible information. If you have questions about this medicine, talk to your doctor, pharmacist, or health care provider.  2022 Elsevier/Gold  Standard (2020-05-21 09:56:16)

## 2021-02-16 ENCOUNTER — Ambulatory Visit (HOSPITAL_COMMUNITY)
Admission: RE | Admit: 2021-02-16 | Discharge: 2021-02-16 | Disposition: A | Discharge status: Home / Self Care | Payer: 59 | Source: Ambulatory Visit | Attending: General Surgery | Admitting: General Surgery

## 2021-02-16 DIAGNOSIS — N61 Mastitis without abscess: Secondary | ICD-10-CM | POA: Insufficient documentation

## 2021-02-16 DIAGNOSIS — N6489 Other specified disorders of breast: Secondary | ICD-10-CM | POA: Diagnosis not present

## 2021-02-16 MED ORDER — GADOBUTROL 1 MMOL/ML IV SOLN
7.0000 mL | Freq: Once | INTRAVENOUS | Status: AC | PRN
  Administered 2021-02-16: 7 mL via INTRAVENOUS

## 2021-02-17 ENCOUNTER — Other Ambulatory Visit: Payer: Self-pay | Admitting: Oncology

## 2021-02-18 ENCOUNTER — Encounter: Payer: Self-pay | Admitting: Oncology

## 2021-02-20 NOTE — Progress Notes (Signed)
Stephanie [name] presents today for follow-up after completing radiation to her left breast on 04/10/2021  Pain: Reports dull, intermittent aching to breast. Tends to be on left/lateral aspect into axilla  Skin: Reports mild erythema. States it it was worse this past Wednesday, but improved after taking ibuprofen consistently  ROM: Denies any new issues/concerns. Reports mild stiffness and decreased range of motion to her left shoulder (she is diligent about stretching and doing yoga to help manage) Lymphedema: Persistent swelling/heaviness to the breast since August of this year. Denies any swelling down left arm MedOnc F/U: Last saw Dr. Lurline Del on 02/15/2021 Bilateral Breast MRI 02/16/2021 --IMPRESSION: No suspicious MRI findings in either breast. Left breast posttreatment changes. Nonspecific left-sided skin and trabecular thickening/edema is most consistent with postradiation changes. --RECOMMENDATION: Clinical and symptomatic follow-up for the patient's left breast symptoms. Otherwise, she may return to routine annual screening, due in September 2023.  Other issues of note: Nothing else of note  Pt reports Yes No Comments  Tamoxifen [x]  []    Letrozole []  [x]    Anastrazole []  [x]    Mammogram [x]  Date: 01/20/2021 [] 

## 2021-02-20 NOTE — Progress Notes (Signed)
Radiation Oncology         [phone] ________________________________  Name: Stephanie [name] MRN: [id number]  Date: 02/21/2021  DOB: 1965-07-27  Reconsultation note  Outpatient  CC: [name], MD  [name], MD  Diagnosis and Prior Radiotherapy: No diagnosis found.  Cancer Staging Malignant neoplasm of upper-outer quadrant of left breast in female, estrogen receptor positive ([place]) Staging form: Breast, AJCC 8th Edition - Clinical stage from 01/06/2019: Stage 0 (cTis (DCIS), cN0, cM0, ER+, PR+) - Signed by Gardenia Phlegm, NP on 01/18/2019 Stage prefix: Initial diagnosis - Pathologic stage from 02/09/2019: Stage 0 (pTis (DCIS), pN0, cM0, ER+, PR+) - Signed by Gardenia Phlegm, NP on 03/01/2019 Stage prefix: Initial diagnosis   Radiation Treatment Dates: 03/13/2019 through 04/11/2019 Site Technique Total Dose (Gy) Dose per Fx (Gy) Completed Fx Beam Energies  Breast, Left: Breast_Lt 3D 42.56/42.56 2.66 16/16 6X  Breast, Left: Breast_Lt_Bst 3D 8/8 2 4/4 6X    CHIEF COMPLAINT: Here for re-evaluation and surveillance of left breast DCIS and breast symptoms  Narrative:  The patient returns today for re-evaluation, she was last seen on the day of her final treatment in December of 2020 (has not followed up since with radiation oncology). Since her final treatment, the patient began tamoxifen on 04/28/2019. She tolerated this well other than associated hot flashes. The patient has maintained close follow-up with Dr. Jana Hakim in the interval pertaining to tamoxifen and for general check-ups, and with her OBGYN in regards to occasional episodes of post-menopausal bleeding (detailed below).         The patient developed postmenopausal bleeding in August of 2021. Accordingly, the patient presented to her OBGYN, Dr. Kennon Rounds, on 12/03/19.  Endometrial biopsy performed during this visit was negative for carcinoma, showing fragments of inactive endometrial  glands.  The patient again developed vaginal bleeding on 10/19/2020; confined to 1 episode.  She had another episode of spotting on 11/16/2020.  In mid July, she underwent a transvaginal ultrasound with Dr. Kennon Rounds to further evaluate the bleeding.  She was noted to have thickening of the lining of the uterus measuring approximately 67mm, and was scheduled for a hysteroscopy and biopsy on 11/27/2020. Endometrial biopsy performed on 11/27/20 was benign, showing predominantly blood with scant strips of metaplastic endometrium. Postmenopausal bleeding was noted by Dr. Jana Hakim to be attributed to tamoxifen.  On 12/31/20, the patient presented to Wil[facility] Bihari (NP) for urgent evaluation of left breast cellulitis.  She was sent in amoxicillin on 12/18/2020 by her OBGYN without resolution. Patient reported her breast as bright red, with redness covering the entire breast.  She additionally had an e-visit on 8/27 and was prescribed Clindamycin, though reported the swelling to persist. On physical exam performed, the left breast erythema was noted as faint and involving the inner line at the site of previous radiation treatment/field. Mild swelling was noted as well.  Clindamycin unfortunately did not resolve the patient's cellulitis and it recurred, prompting a visit with Dr. Donne Hazel on 01/23/21 for further evaluation. Patient reported low-grade fever and lethargy and was treated again with clindamycin which again helped though symptoms of lethargy recurred again. On physical exam, the left breast was noted as tender and without much erythema. Mammogram and US performed on 01/20/21 were both negative showing only some diffuse mild skin thickening consistent with radiation (detailed below). Accordingly, Dr. Donne Hazel prescribed a 30 day course of doxycycline. (Dr. Donne Hazel noted that the patient's left breast inflammation is certainly not related to her cancer or  inflammatory cancer).   Following the additional course  of antibiotics, the patient reported feeling somewhat better on 02/04/21 During follow-up with Dr. Donne Hazel, other than a low-grade fever. Patient reported in the interval since starting doxycyline to have lethargy and fatigue as well as recurrence of significant redness of her left breast. After calling in and being switched to Bactrim, the patient seemed to be doing much better. Due to persistence of symptoms, Dr. Donne Hazel collected a punch biopsy of the erythematous breast tissue. Pathology revealed perivascular dermatitis with eosinophils, and focal spongiosis of the epidermis.   Subsequently, Dr. Donne Hazel ordered IV antibiotics for the patient at the outpatient infusion center since oral antibiotics seemed to provide no resolution. Bilateral breast MRI again revealed normal post-treatment thickening and edema in the left breast. During follow-up with Dr. Jana Hakim on 02/13/21, the patient reported resolution of left breast erythema for the most part from IV antibiotics, though reported developing diarrhea and lethargy since then. Dr. Jana Hakim prescribed Lucrezia Starch for diarrhea and informed the patient to follow-up if diarrhea persists (if it persists, evaluation for c-diff will be performed due to several courses of clindamycin).   Pertinent imaging since the patient was last seen includes:  -- Diagnostic left breast mammogram and [place] on 08/16/19 which demonstrated postoperative changes in the left breast and no evidence of malignancy or seroma.  --DXA for bone mineral density on 01/05/20 which measured the AP Spine to have a T-score of -2.3, classifying the patient as osteopenic according to WHO criteria.  --Diagnostic left breast mammogram on 01/19/20 demonstrated no evidence of breast malignancy.  --Pelvic ultrasound on 11/05/20 prompted by postmenopausal bleeding revealed a thickened and heterogeneous endometrial complex 15 mm thick, in the setting of postmenopausal bleeding. Uterus and right ovary  appeared unremarkable, and the left ovary was non-visible.  --Bilateral mammogram and left breast US on 01/20/21 demonstrated stable postsurgical changes and skin thickening which most likely reflects post radiation change. --Bilateral breast MRI on 02/16/21 revealed no suspicious findings in either breast. Left breast posttreatment changes were again appreciated, as well as nonspecific left-sided skin and trabecular thickening/edema most consistent with postradiation changes.  ALLERGIES:  is allergic to wellbutrin xl [bupropion].  Meds: Current Outpatient Medications  Medication Sig Dispense Refill   alendronate (FOSAMAX) 70 MG tablet TAKE 1 TABLET BY MOUTH ONCE A WEEK. TAKE WITH A FULL GLASS OF WATER ON AN EMPTY STOMACH 12 tablet 3   amoxicillin-clavulanate (AUGMENTIN) 875-125 MG tablet Take 1 tablet by mouth 2 (two) times daily. 20 tablet 0   Biotin w/ Vitamins C & E (HAIR/SKIN/NAILS PO) Take 1 tablet by mouth daily.      Calcium Carb-Cholecalciferol (CALCIUM + D3 PO) Take 1 tablet by mouth daily.      calcium carbonate (OS-CAL) 1250 (500 Ca) MG chewable tablet Chew 1 tablet by mouth daily.     cholestyramine (QUESTRAN) 4 g packet Take 1 packet (4 g total) by mouth 2 (two) times daily. 12 each 1   clobetasol (TEMOVATE) 0.05 % GEL Apply 1 Dose topically 2 (two) times daily. 2x/day x 3-4 wks, then 1x/day x 4 wks, then every other day x  4 wks, then 2x/wk 60 g 3   doxycycline (VIBRAMYCIN) 100 MG capsule Take 1 capsule (100 mg total) by mouth 2 (two) times daily for 30 days 60 capsule 0   estradiol (ESTRING) 2 MG vaginal ring PLACE 1 RING VAGINALLY EVERY 3 MONTHS, FOLLOW PACKAGE DIRECTIONS 1 each 12   fluconazole (DIFLUCAN) 150 MG tablet  Take 1 tablet (150 mg total) by mouth daily. 2 tablet 0   gabapentin (NEURONTIN) 300 MG capsule TAKE 1 CAPSULE BY MOUTH AT BEDTIME 90 capsule 4   ondansetron (ZOFRAN-ODT) 4 MG disintegrating tablet Take 1 tablet (4 mg total) by mouth every 8 (eight) hours as  needed for nausea for up to 7 days. 20 tablet 1   sertraline (ZOLOFT) 100 MG tablet Take 1 tablet by mouth daily. 90 tablet 1   sulfamethoxazole-trimethoprim (BACTRIM DS) 800-160 MG tablet Take 1 tablet by mouth every 12 hours for 10 days. 20 tablet 0   tamoxifen (NOLVADEX) 20 MG tablet TAKE 1 TABLET BY MOUTH ONCE DAILY 90 tablet 12   TURMERIC PO Take by mouth.     No current facility-administered medications for this encounter.   Facility-Administered Medications Ordered in Other Encounters  Medication Dose Route Frequency Provider Last Rate Last Admin   0.9 %  sodium chloride infusion   Intravenous Continuous Magrinat, Virgie Dad, MD 20 mL/hr at 02/06/21 1648 Infusion Verify at 02/06/21 1648    Physical Findings: The patient is in no acute distress. Patient is alert and oriented.  vitals were not taken for this visit. .     Left breast has faint erythema that roughly mimics the radiation fields today.  There is mild swelling of the left breast.  No excessive warmth.    Lab Findings: Lab Results  Component Value Date   WBC 7.3 02/13/2021   HGB 12.2 02/13/2021   HCT 36.9 02/13/2021   MCV 92.5 02/13/2021   PLT 397 02/13/2021    Radiographic Findings: MR BREAST BILATERAL W WO CONTRAST INC CAD  Result Date: 02/17/2021 CLINICAL DATA:  55 year old female with history of recent recurrent left breast infection. Patient has a history of left breast DCIS, status post lumpectomy in 2020. LABS:  None performed on site. EXAM: BILATERAL BREAST MRI WITH AND WITHOUT CONTRAST TECHNIQUE: Multiplanar, multisequence MR images of both breasts were obtained prior to and following the intravenous administration of 7 ml of Gadavist. Three-dimensional MR images were rendered by post-processing of the original MR data on an independent workstation. The three-dimensional MR images were interpreted, and findings are reported in the following complete MRI report for this study. Three dimensional images were  evaluated at the independent interpreting workstation using the DynaCAD thin client. COMPARISON:  Previous exam(s). FINDINGS: Breast composition: c. Heterogeneous fibroglandular tissue. Background parenchymal enhancement: Mild. Right breast: No suspicious mass or abnormal enhancement. Left breast: No suspicious mass or abnormal enhancement. Stable post lumpectomy changes in the superior central left breast. There is diffuse left-sided skin and trabecular thickening and edema. This is a nonspecific finding and may be related to prior radiation related changes. No suspicious or associated skin enhancement. Lymph nodes: No abnormal appearing lymph nodes. Ancillary findings:  None. IMPRESSION: 1. No suspicious MRI findings in either breast. 2. Left breast posttreatment changes. Nonspecific left-sided skin and trabecular thickening/edema is most consistent with postradiation changes. RECOMMENDATION: 1. Clinical and symptomatic follow-up for the patient's left breast symptoms. 2. Otherwise, she may return to routine annual screening, due in September 2023. BI-RADS CATEGORY  2: Benign. Electronically Signed   By: Kristopher Oppenheim M.D.   On: 02/17/2021 10:18   Impression/Plan: This is a very nice 55 year old woman with a history of DCIS that was treated with adjuvant radiation to the left breast 2 years ago.  She has been on tamoxifen since then.  Of note, she states that she stopped the tamoxifen for 3  weeks when she had her vaginal bleeding this summer.  She recalls restarting the tamoxifen on August 3rd and about 3 weeks later she developed the symptoms in her breast as well as fever and chills.  She has been treated with multiple courses of antibiotics but no obvious infection has been proven.  Her white count has remained normal.  Symptoms in her breast have waxed and waned since August 26.  This could be a case of radiation recall.  Tamoxifen has been documented as an inciting agent of radiation recall in the  medical literature.  This is not a straightforward case given that she had been on tamoxifen for about 1-1/2 years and tolerated this well previously.  However, tamoxifen is the only medication that she either restarted or started in August prior to the onset of her symptoms.  I recommend that she hold her tamoxifen and start a steroid taper of dexamethasone which I prescribed for her to take for 2 weeks.  I will see her back in 2 and half weeks, just prior to her appointment with medical oncology.  She is pleased with this plan.  Of note I have reviewed her prior radiation plan and discussed her with Dr. Donne Hazel and Dr. Jana Hakim.  On date of service, in total, I spent 45 minutes on this encounter. Patient was seen in person.  _____________________________________   Eppie Gibson, MD  This document serves as a record of services personally performed by Eppie Gibson, MD. It was created on her behalf by Roney Mans, a trained medical scribe. The creation of this record is based on the scribe's personal observations and the provider's statements to them. This document has been checked and approved by the attending provider.

## 2021-02-21 ENCOUNTER — Other Ambulatory Visit: Payer: Self-pay

## 2021-02-21 ENCOUNTER — Encounter: Payer: Self-pay | Admitting: Radiation Oncology

## 2021-02-21 ENCOUNTER — Ambulatory Visit
Admission: RE | Admit: 2021-02-21 | Discharge: 2021-02-21 | Disposition: A | Discharge status: Home / Self Care | Payer: 59 | Source: Ambulatory Visit | Attending: Radiation Oncology | Admitting: Radiation Oncology

## 2021-02-21 ENCOUNTER — Other Ambulatory Visit (HOSPITAL_BASED_OUTPATIENT_CLINIC_OR_DEPARTMENT_OTHER): Payer: Self-pay

## 2021-02-21 ENCOUNTER — Other Ambulatory Visit (HOSPITAL_COMMUNITY): Payer: Self-pay

## 2021-02-21 VITALS — BP 117/67 | HR 82 | Temp 96.4°F | Resp 18 | Ht 63.0 in | Wt 151.4 lb

## 2021-02-21 DIAGNOSIS — Z170 Estrogen receptor positive status [ER+]: Secondary | ICD-10-CM | POA: Insufficient documentation

## 2021-02-21 DIAGNOSIS — R609 Edema, unspecified: Secondary | ICD-10-CM | POA: Diagnosis not present

## 2021-02-21 DIAGNOSIS — D0512 Intraductal carcinoma in situ of left breast: Secondary | ICD-10-CM | POA: Diagnosis not present

## 2021-02-21 DIAGNOSIS — Z923 Personal history of irradiation: Secondary | ICD-10-CM | POA: Insufficient documentation

## 2021-02-21 DIAGNOSIS — N61 Mastitis without abscess: Secondary | ICD-10-CM | POA: Diagnosis not present

## 2021-02-21 DIAGNOSIS — Z79899 Other long term (current) drug therapy: Secondary | ICD-10-CM | POA: Diagnosis not present

## 2021-02-21 DIAGNOSIS — C50412 Malignant neoplasm of upper-outer quadrant of left female breast: Secondary | ICD-10-CM

## 2021-02-21 DIAGNOSIS — R232 Flushing: Secondary | ICD-10-CM | POA: Insufficient documentation

## 2021-02-21 DIAGNOSIS — Z08 Encounter for follow-up examination after completed treatment for malignant neoplasm: Secondary | ICD-10-CM | POA: Diagnosis not present

## 2021-02-21 DIAGNOSIS — Z79810 Long term (current) use of selective estrogen receptor modulators (SERMs): Secondary | ICD-10-CM | POA: Insufficient documentation

## 2021-02-21 MED ORDER — OMEPRAZOLE 20 MG PO CPDR
20.0000 mg | DELAYED_RELEASE_CAPSULE | Freq: Every day | ORAL | 0 refills | Status: DC
  Filled 2021-02-21 (×2): qty 15, 15d supply, fill #0

## 2021-02-21 MED ORDER — DEXAMETHASONE 2 MG PO TABS
ORAL_TABLET | ORAL | 0 refills | Status: DC
  Filled 2021-02-21 (×2): qty 42, 15d supply, fill #0

## 2021-02-24 ENCOUNTER — Inpatient Hospital Stay: Payer: 59

## 2021-02-24 ENCOUNTER — Other Ambulatory Visit: Payer: Self-pay | Admitting: *Deleted

## 2021-02-24 ENCOUNTER — Other Ambulatory Visit: Payer: Self-pay

## 2021-02-24 ENCOUNTER — Ambulatory Visit: Payer: 59 | Admitting: Radiation Oncology

## 2021-02-24 ENCOUNTER — Telehealth: Payer: Self-pay | Admitting: *Deleted

## 2021-02-24 ENCOUNTER — Inpatient Hospital Stay (HOSPITAL_BASED_OUTPATIENT_CLINIC_OR_DEPARTMENT_OTHER): Payer: 59 | Admitting: Physician Assistant

## 2021-02-24 ENCOUNTER — Ambulatory Visit: Payer: 59

## 2021-02-24 ENCOUNTER — Encounter: Payer: Self-pay | Admitting: Oncology

## 2021-02-24 ENCOUNTER — Other Ambulatory Visit: Payer: Self-pay | Admitting: Oncology

## 2021-02-24 VITALS — BP 128/75 | HR 68 | Temp 98.5°F | Resp 16 | Ht 63.0 in | Wt 153.7 lb

## 2021-02-24 DIAGNOSIS — C50412 Malignant neoplasm of upper-outer quadrant of left female breast: Secondary | ICD-10-CM | POA: Diagnosis not present

## 2021-02-24 DIAGNOSIS — Z170 Estrogen receptor positive status [ER+]: Secondary | ICD-10-CM

## 2021-02-24 DIAGNOSIS — N61 Mastitis without abscess: Secondary | ICD-10-CM

## 2021-02-24 DIAGNOSIS — L03818 Cellulitis of other sites: Secondary | ICD-10-CM

## 2021-02-24 DIAGNOSIS — L0390 Cellulitis, unspecified: Secondary | ICD-10-CM

## 2021-02-24 LAB — CBC WITH DIFFERENTIAL (CANCER CENTER ONLY)
Abs Immature Granulocytes: 0.04 10*3/uL (ref 0.00–0.07)
Basophils Absolute: 0 10*3/uL (ref 0.0–0.1)
Basophils Relative: 0 %
Eosinophils Absolute: 0 10*3/uL (ref 0.0–0.5)
Eosinophils Relative: 0 %
HCT: 32.7 % — ABNORMAL LOW (ref 36.0–46.0)
Hemoglobin: 11.5 g/dL — ABNORMAL LOW (ref 12.0–15.0)
Immature Granulocytes: 0 %
Lymphocytes Relative: 13 %
Lymphs Abs: 1.3 10*3/uL (ref 0.7–4.0)
MCH: 31.3 pg (ref 26.0–34.0)
MCHC: 35.2 g/dL (ref 30.0–36.0)
MCV: 89.1 fL (ref 80.0–100.0)
Monocytes Absolute: 0.6 10*3/uL (ref 0.1–1.0)
Monocytes Relative: 6 %
Neutro Abs: 7.9 10*3/uL — ABNORMAL HIGH (ref 1.7–7.7)
Neutrophils Relative %: 81 %
Platelet Count: 318 10*3/uL (ref 150–400)
RBC: 3.67 MIL/uL — ABNORMAL LOW (ref 3.87–5.11)
RDW: 12 % (ref 11.5–15.5)
WBC Count: 9.9 10*3/uL (ref 4.0–10.5)
nRBC: 0 % (ref 0.0–0.2)

## 2021-02-24 LAB — CMP (CANCER CENTER ONLY)
ALT: 47 U/L — ABNORMAL HIGH (ref 0–44)
AST: 43 U/L — ABNORMAL HIGH (ref 15–41)
Albumin: 3.7 g/dL (ref 3.5–5.0)
Alkaline Phosphatase: 56 U/L (ref 38–126)
Anion gap: 9 (ref 5–15)
BUN: 17 mg/dL (ref 6–20)
CO2: 27 mmol/L (ref 22–32)
Calcium: 8.9 mg/dL (ref 8.9–10.3)
Chloride: 102 mmol/L (ref 98–111)
Creatinine: 0.7 mg/dL (ref 0.44–1.00)
GFR, Estimated: >60 mL/min (ref >60)
Glucose, Bld: 93 mg/dL (ref 70–99)
Potassium: 3.6 mmol/L (ref 3.5–5.1)
Sodium: 138 mmol/L (ref 135–145)
Total Bilirubin: 0.4 mg/dL (ref 0.3–1.2)
Total Protein: 6.8 g/dL (ref 6.5–8.1)

## 2021-02-24 MED ORDER — SODIUM CHLORIDE 0.9 % IV SOLN: Freq: Once | INTRAVENOUS | Status: AC

## 2021-02-24 MED ORDER — VANCOMYCIN HCL 1 G IV SOLR
1500.0000 mg | INTRAVENOUS | Status: DC
  Administered 2021-02-24: 1500 mg via INTRAVENOUS
  Filled 2021-02-24: qty 30

## 2021-02-24 NOTE — Telephone Encounter (Signed)
This RN spoke with pt and providers per her My Chart message with plan per MD:  Pt to come in for labs and visit with [facility] provider and to get dose of IV vancomycin today.  Pt is to get vanco for 10 days starting today.  Referral requested for Infectious disease with Urgent request.  This RN discussed above with pt who agreed to plan with request to have further vanco per home health in the home with a PICC line.  This RN placed order for PICC per MD-  and spoke with IR and obtained an appt for 8 am tomorrow. This RN spoke with Carolynn Sayers RN with [facility] who verified they could start home care for PICC and Vanco starting tomorrow.

## 2021-02-24 NOTE — Patient Instructions (Signed)
Vancomycin injection What is this medication? VANCOMYCIN Lucianne Lei koe MYE sin) is a glycopeptide antibiotic. It is used to treat certain kinds of bacterial infections. It will not work for colds, flu, or other viral infections. This medicine may be used for other purposes; ask your health care provider or pharmacist if you have questions. COMMON BRAND NAME(S): Glo Herring What should I tell my care team before I take this medication? They need to know if you have any of these conditions: dehydration hearing loss kidney disease other chronic illness an unusual or allergic reaction to vancomycin, other medicines, foods, dyes, or preservatives pregnant or trying to get pregnant breast-feeding How should I use this medication? This medicine is infused into a vein. It is usually given by a health care provider in a hospital or clinic. If you receive this medicine at home, you will receive special instructions. Take your medicine at regular intervals. Do not take your medicine more often than directed. Take all of your medicine as directed even if you think you are better. Do not skip doses or stop your medicine early. It is important that you put your used needles and syringes in a special sharps container. Do not put them in a trash can. If you do not have a sharps container, call your pharmacist or healthcare provider to get one. Talk to your pediatrician regarding the use of this medicine in children. While this drug may be prescribed for even very young infants for selected conditions, precautions do apply. Overdosage: If you think you have taken too much of this medicine contact a poison control center or emergency room at once. NOTE: This medicine is only for you. Do not share this medicine with others. What if I miss a dose? If you miss a dose, take it as soon as you can. If it is almost time for your next dose, take only that dose. Do not take double or extra doses. What may interact  with this medication? amphotericin B anesthetics bacitracin birth control pills cisplatin colistin diuretics other aminoglycoside antibiotics polymyxin B This list may not describe all possible interactions. Give your health care provider a list of all the medicines, herbs, non-prescription drugs, or dietary supplements you use. Also tell them if you smoke, drink alcohol, or use illegal drugs. Some items may interact with your medicine. What should I watch for while using this medication? Tell your doctor or health care provider if your symptoms do not improve or if you get new symptoms. Your condition and lab work will be monitored while you are taking this medicine. Do not treat diarrhea with over the counter products. Contact your doctor if you have diarrhea that lasts more than 2 days or if it is severe and watery. This medicine may cause serious skin reactions. They can happen weeks to months after starting the medicine. Contact your health care provider right away if you notice fevers or flu-like symptoms with a rash. The rash may be red or purple and then turn into blisters or peeling of the skin. Or, you might notice a red rash with swelling of the face, lips or lymph nodes in your neck or under your arms. What side effects may I notice from receiving this medication? Side effects that you should report to your doctor or health care professional as soon as possible: allergic reactions like skin rash, itching or hives, swelling of the face, lips, or tongue breathing difficulty, wheezing change in amount, color of urine change in hearing chest  pain dizziness fever, chills flushing of the face and neck (reddening) low blood pressure rash, fever, and swollen lymph nodes redness, blistering, peeling or loosening of the skin, including inside the mouth unusual bleeding or bruising unusually weak or tired Side effects that usually do not require medical attention (report to your doctor  or health care professional if they continue or are bothersome): nausea, vomiting pain, swelling where injected stomach cramps This list may not describe all possible side effects. Call your doctor for medical advice about side effects. You may report side effects to FDA at 1-800-FDA-1088. Where should I keep my medication? Keep out of the reach of children. You will be instructed on how to store this medicine, if needed. Throw away any unused medicine after the expiration date on the label. NOTE: This sheet is a summary. It may not cover all possible information. If you have questions about this medicine, talk to your doctor, pharmacist, or health care provider.  2022 Elsevier/Gold Standard (2018-07-22 16:14:12)

## 2021-02-24 NOTE — Progress Notes (Signed)
Symptom Management Consult note [place]   [phone] [phone]    Patient Care Team: [name], MD as PCP - General (Family Medicine) Kennith Center, RD as Dietitian (Family Medicine) Donnamae Jude, MD as Consulting Physician (Obstetrics and Gynecology) Magrinat, Virgie Dad, MD as Consulting Physician (Oncology) [name], MD as Consulting Physician (General Surgery) Eppie Gibson, MD as Attending Physician (Radiation Oncology)    Name of the patient: Stephanie [name]  [id number]  Apr 22, 1966   Date of visit: 02/24/2021    Chief complaint/ Reason for visit- left breast redness  Oncology History  Malignant neoplasm of upper-outer quadrant of left breast in female, estrogen receptor positive ([place])  01/06/2019 Cancer Staging   Staging form: Breast, AJCC 8th Edition - Clinical stage from 01/06/2019: Stage 0 (cTis (DCIS), cN0, cM0, ER+, PR+) - Signed by Gardenia Phlegm, NP on 01/18/2019    01/16/2019 Initial Diagnosis   Malignant neoplasm of upper-outer quadrant of left breast in female, estrogen receptor positive ([place])   01/19/2019 Genetic Testing   Negative genetic testing. No pathogenic variants identified on the [facility] Breast Cancer STAT Panel + Common Hereditary Cancers Panel. The STAT Breast cancer panel offered by [facility] includes sequencing and rearrangement analysis for the following 9 genes:  ATM, BRCA1, BRCA2, CDH1, CHEK2, PALB2, PTEN, STK11 and TP53.  The Common Hereditary Cancers Panel offered by [facility] includes sequencing and/or deletion duplication testing of the following 47 genes: APC, ATM, AXIN2, BARD1, BMPR1A, BRCA1, BRCA2, BRIP1, CDH1, CDKN2A (p14ARF), CDKN2A (p16INK4a), CKD4, CHEK2, CTNNA1, DICER1, EPCAM (Deletion/duplication testing only), GREM1 (promoter region deletion/duplication testing only), KIT, MEN1, MLH1, MSH2, MSH3, MSH6, MUTYH, NBN, NF1, NHTL1, PALB2, PDGFRA, PMS2, POLD1, POLE, PTEN, RAD50,  RAD51C, RAD51D, SDHB, SDHC, SDHD, SMAD4, SMARCA4. STK11, TP53, TSC1, TSC2, and VHL.  The following genes were evaluated for sequence changes only: SDHA and HOXB13 c.251G>A variant only. The report date is 01/19/2019.    02/09/2019 Cancer Staging   Staging form: Breast, AJCC 8th Edition - Pathologic stage from 02/09/2019: Stage 0 (pTis (DCIS), pN0, cM0, ER+, PR+) - Signed by Gardenia Phlegm, NP on 03/01/2019      Current Therapy: Tamoxifen, currently on break.   Interval history- Lindzee L. Isidoro is a 55 yo female with history significant for malignant neoplasm of upper-outer quadrant of left breast, estrogen receptor positive presenting to [facility] today with chief complaint of left breast erythema x 2 days. She has history of breast cellulitis and has completed multiple rounds of both PO and IV antibiotics. She was most recently taking PO doxycycline. She admits to discontinuing that 02/20/21 after approximately 5 day course. Patient noticed mild redness to her left breast when she woke up Sunday morning.  She denies any associated pain.  She states when she woke up this a.m. the breast redness had slightly improved however was still present and she had a low-grade fever of 100.2.  She has not taken any over-the-counter medications for symptoms prior to arrival.  Patient called Dr. Virgie Dad office and it was recommended she have lab work and start treatment for breast cellulitis with vancomycin x10 days.  Patient denies any chills, breast pain, skin changes, nausea, vomiting. Patient did recently see radiation oncologist Dr. Isidore Moos 02/21/21 who was concerned for possible radiation recall and started patient on dexamethasone taper x 2 weeks and encouraged tamoxifen break as symptoms seemed to first start back in August when she restarted Tamoxifen.   ROS  All other systems  are reviewed and are negative for acute change except as noted in the HPI.    Allergies  Allergen Reactions    Wellbutrin Xl [Bupropion] Hives     Past Medical History:  Diagnosis Date   Anxiety    Breast mass, left    Cancer ([place])    Complication of anesthesia    felt lethargic x 4 days following last surgery in 2017, but 2018 did okay   Depression    Headache    migraines   History of radiation therapy 03/13/19- 04/11/19   Left Breast 16 fraction of 2. 66 Gy each to total 42.56 Gy. Left breast boost 4 fractions of 2 Gy to total 8 Gy.    Osteopenia      Past Surgical History:  Procedure Laterality Date   BREAST BIOPSY Left 07/10/2015   high risk stereo   BREAST EXCISIONAL BIOPSY Left 08/15/2015   ADH   BREAST LUMPECTOMY WITH RADIOACTIVE SEED LOCALIZATION Left 08/15/2015   Procedure:  LEFT BREAST LUMPECTOMY WITH RADIOACTIVE SEED LOCALIZATION;  Surgeon: Erroll Luna, MD;  Location: [place];  Service: General;  Laterality: Left;   BREAST LUMPECTOMY WITH RADIOACTIVE SEED LOCALIZATION Left 02/09/2019   Procedure: LEFT BREAST LUMPECTOMY WITH RADIOACTIVE SEED LOCALIZATION;  Surgeon: [name], MD;  Location: [place];  Service: General;  Laterality: Left;   EXCISION OF BREAST BIOPSY Left 12/30/2016   Benign ALH sclerosing lesion   RADIOACTIVE SEED GUIDED EXCISIONAL BREAST BIOPSY Left 12/30/2016   Procedure: RADIOACTIVE SEED GUIDED EXCISIONAL LEFT BREAST BIOPSY;  Surgeon: [name], MD;  Location: [place];  Service: General;  Laterality: Left;   RADIOACTIVE SEED GUIDED EXCISIONAL BREAST BIOPSY Left 02/09/2019   Procedure: RADIOACTIVE SEED GUIDED EXCISIONAL LEFT  BREAST BIOPSY;  Surgeon: [name], MD;  Location: [place];  Service: General;  Laterality: Left;   TONSILLECTOMY  Age 51   TRIGGER FINGER RELEASE  2010   Right Thumb    Social History   Socioeconomic History   Marital status: Married    Spouse name: Not on file   Number of children: Not on file   Years of education: Not on file   Highest education level: Not on file   Occupational History   Occupation: RN    Employer: [employer]  Tobacco Use   Smoking status: Never   Smokeless tobacco: Never  Vaping Use   Vaping Use: Never used  Substance and Sexual Activity   Alcohol use: No    Comment: rare use   Drug use: No   Sexual activity: Yes    Partners: Male    Birth control/protection: Post-menopausal  Other Topics Concern   Not on file  Social History Narrative   04/05/12 AM he was born and grew up in [place]. She has 3 older brothers. She never suffered any abuse. Her father died when she was 4 years of age. She completed her bachelor of science in nursing in 1989, and has been working as an Therapist, sports . She has been married for 21 years, and she and her husband have a 60 year old son and a 98 year old daughter. She affiliates as Psychologist, forensic. She denies any legal involvement. She enjoys hiking and reading. She reports her social support consists of friends from college, and a friend from church. 04/05/2012   Social Determinants of Health   Financial Resource Strain: Not on file  Food Insecurity: Not on file  Transportation Needs: Not on file  Physical Activity: Not on  file  Stress: Not on file  Social Connections: Not on file  Intimate Partner Violence: Not on file    Family History  Problem Relation Age of Onset   Depression Brother    Atrial fibrillation Brother    Hypertension Mother    Osteoporosis Mother    ADD / ADHD Son    ADD / ADHD Daughter    Diabetes Maternal Grandmother    Diabetes Maternal Grandfather    Diabetes Paternal Grandmother    Diabetes Paternal Grandfather    Parkinson's disease Maternal Aunt      Current Outpatient Medications:    alendronate (FOSAMAX) 70 MG tablet, TAKE 1 TABLET BY MOUTH ONCE A WEEK. TAKE WITH A FULL GLASS OF WATER ON AN EMPTY STOMACH, Disp: 12 tablet, Rfl: 3   amoxicillin-clavulanate (AUGMENTIN) 875-125 MG tablet, Take 1 tablet by mouth 2 (two) times daily., Disp: 20 tablet, Rfl:  0   Biotin w/ Vitamins C & E (HAIR/SKIN/NAILS PO), Take 1 tablet by mouth daily. , Disp: , Rfl:    Calcium Carb-Cholecalciferol (CALCIUM + D3 PO), Take 1 tablet by mouth daily. , Disp: , Rfl:    calcium carbonate (OS-CAL) 1250 (500 Ca) MG chewable tablet, Chew 1 tablet by mouth daily., Disp: , Rfl:    cholestyramine (QUESTRAN) 4 g packet, Take 1 packet (4 g total) by mouth 2 (two) times daily., Disp: 12 each, Rfl: 1   clobetasol (TEMOVATE) 0.05 % GEL, Apply 1 Dose topically 2 (two) times daily. 2x/day x 3-4 wks, then 1x/day x 4 wks, then every other day x  4 wks, then 2x/wk, Disp: 60 g, Rfl: 3   dexamethasone (DECADRON) 2 MG tablet, Take 2 tablets 3 times daily for 3 days, then 2 tablets twice daily for 3 days, then 2 tablets daily for 3 days, then 1 tablet daily for 6 days, then stop. Take with food., Disp: 42 tablet, Rfl: 0   doxycycline (VIBRAMYCIN) 100 MG capsule, Take 1 capsule (100 mg total) by mouth 2 (two) times daily for 30 days, Disp: 60 capsule, Rfl: 0   estradiol (ESTRING) 2 MG vaginal ring, PLACE 1 RING VAGINALLY EVERY 3 MONTHS, FOLLOW PACKAGE DIRECTIONS, Disp: 1 each, Rfl: 12   fluconazole (DIFLUCAN) 150 MG tablet, Take 1 tablet (150 mg total) by mouth daily., Disp: 2 tablet, Rfl: 0   gabapentin (NEURONTIN) 300 MG capsule, TAKE 1 CAPSULE BY MOUTH AT BEDTIME, Disp: 90 capsule, Rfl: 4   omeprazole (PRILOSEC) 20 MG capsule, Take 1 capsule (20 mg total) by mouth daily. Take to prevent upset stomach while on Dexamethasone., Disp: 15 capsule, Rfl: 0   ondansetron (ZOFRAN-ODT) 4 MG disintegrating tablet, Take 1 tablet (4 mg total) by mouth every 8 (eight) hours as needed for nausea for up to 7 days., Disp: 20 tablet, Rfl: 1   sertraline (ZOLOFT) 100 MG tablet, Take 1 tablet by mouth daily., Disp: 90 tablet, Rfl: 1   sulfamethoxazole-trimethoprim (BACTRIM DS) 800-160 MG tablet, Take 1 tablet by mouth every 12 hours for 10 days., Disp: 20 tablet, Rfl: 0   tamoxifen (NOLVADEX) 20 MG tablet, TAKE 1  TABLET BY MOUTH ONCE DAILY, Disp: 90 tablet, Rfl: 12   TURMERIC PO, Take by mouth., Disp: , Rfl:  No current facility-administered medications for this visit.  Facility-Administered Medications Ordered in Other Visits:    0.9 %  sodium chloride infusion, , Intravenous, Continuous, Magrinat, Virgie Dad, MD, Last Rate: 20 mL/hr at 02/06/21 1648, Infusion Verify at 02/06/21 1648   vancomycin (  VANCOCIN) 1,500 mg in sodium chloride 0.9 % 500 mL IVPB, 1,500 mg, Intravenous, Q24H, Magrinat, Virgie Dad, MD, Last Rate: 250 mL/hr at 02/24/21 1533, 1,500 mg at 02/24/21 1533  PHYSICAL EXAM: ECOG FS:1 - Symptomatic but completely ambulatory    Vitals:   02/24/21 1406  BP: 128/75  Pulse: 68  Resp: 16  Temp: 98.5 F (36.9 C)  TempSrc: Oral  SpO2: 100%  Weight: 153 lb 11.2 oz (69.7 kg)  Height: _0  (1.6 m)   Physical Exam Vitals and nursing note reviewed.  Constitutional:      Appearance: She is well-developed. She is not ill-appearing or toxic-appearing.  HENT:     Head: Normocephalic and atraumatic.     Right Ear: External ear normal.     Left Ear: External ear normal.     Nose: Nose normal.     Mouth/Throat:     Mouth: Mucous membranes are moist.  Eyes:     General: No scleral icterus.       Right eye: No discharge.        Left eye: No discharge.     Conjunctiva/sclera: Conjunctivae normal.  Neck:     Vascular: No JVD.  Cardiovascular:     Rate and Rhythm: Normal rate and regular rhythm.     Pulses: Normal pulses.     Heart sounds: Normal heart sounds.  Pulmonary:     Effort: Pulmonary effort is normal.     Breath sounds: Normal breath sounds.  Chest:  Breasts:    Right: Normal.     Comments: Left breast with faint erythema with mild swelling and warmth.  No palpable fluctuance, no gross abscess. No nipple inversion or discharge.  Abdominal:     General: There is no distension.  Musculoskeletal:        General: Normal range of motion.     Cervical back: Normal range of  motion.     Right lower leg: No edema.     Left lower leg: No edema.  Skin:    General: Skin is warm and dry.  Neurological:     Mental Status: She is oriented to person, place, and time.     GCS: GCS eye subscore is 4. GCS verbal subscore is 5. GCS motor subscore is 6.     Comments: Fluent speech, no facial droop.  Psychiatric:        Behavior: Behavior normal.      LABORATORY DATA: I have reviewed the data as listed CBC Latest Ref Rng & Units 02/24/2021 02/13/2021 12/31/2020  WBC 4.0 - 10.5 K/uL 9.9 7.3 7.2  Hemoglobin 12.0 - 15.0 g/dL 11.5(L) 12.2 12.0  Hematocrit 36.0 - 46.0 % 32.7(L) 36.9 35.8(L)  Platelets 150 - 400 K/uL 318 397 400     CMP Latest Ref Rng & Units 02/24/2021 02/13/2021 12/31/2020  Glucose 70 - 99 mg/dL 93 55(L) 89  BUN 6 - 20 mg/dL _1 Creatinine 0.44 - 1.00 mg/dL 0.70 0.77 0.80  Sodium 135 - 145 mmol/L 138 144 144  Potassium 3.5 - 5.1 mmol/L 3.6 3.7 4.4  Chloride 98 - 111 mmol/L 102 106 103  CO2 22 - 32 mmol/L _2 Calcium 8.9 - 10.3 mg/dL 8.9 9.0 10.2  Total Protein 6.5 - 8.1 g/dL 6.8 - 6.9  Total Bilirubin 0.3 - 1.2 mg/dL 0.4 - 0.3  Alkaline Phos 38 - 126 U/L 56 - 73  AST 15 - 41 U/L 43(H) - 47(H)  ALT  0 - 44 U/L 47(H) - 33       RADIOGRAPHIC STUDIES: I have personally reviewed the radiological images as listed and agreed with the findings in the report. No images are attached to the encounter. MR BREAST BILATERAL W WO CONTRAST INC CAD  Result Date: 02/17/2021 CLINICAL DATA:  55 year old female with history of recent recurrent left breast infection. Patient has a history of left breast DCIS, status post lumpectomy in 2020. LABS:  None performed on site. EXAM: BILATERAL BREAST MRI WITH AND WITHOUT CONTRAST TECHNIQUE: Multiplanar, multisequence MR images of both breasts were obtained prior to and following the intravenous administration of 7 ml of Gadavist. Three-dimensional MR images were rendered by post-processing of the original MR  data on an independent workstation. The three-dimensional MR images were interpreted, and findings are reported in the following complete MRI report for this study. Three dimensional images were evaluated at the independent interpreting workstation using the DynaCAD thin client. COMPARISON:  Previous exam(s). FINDINGS: Breast composition: c. Heterogeneous fibroglandular tissue. Background parenchymal enhancement: Mild. Right breast: No suspicious mass or abnormal enhancement. Left breast: No suspicious mass or abnormal enhancement. Stable post lumpectomy changes in the superior central left breast. There is diffuse left-sided skin and trabecular thickening and edema. This is a nonspecific finding and may be related to prior radiation related changes. No suspicious or associated skin enhancement. Lymph nodes: No abnormal appearing lymph nodes. Ancillary findings:  None. IMPRESSION: 1. No suspicious MRI findings in either breast. 2. Left breast posttreatment changes. Nonspecific left-sided skin and trabecular thickening/edema is most consistent with postradiation changes. RECOMMENDATION: 1. Clinical and symptomatic follow-up for the patient's left breast symptoms. 2. Otherwise, she may return to routine annual screening, due in September 2023. BI-RADS CATEGORY  2: Benign. Electronically Signed   By: Kristopher Oppenheim M.D.   On: 02/17/2021 10:18    ASSESSMENT & PLAN: Patient is a 55 y.o. female with history of malignant neoplasm of upper outer quadrant of left breast, estrogen receptor positive currently on break from tamoxifen followed by oncologist Dr. Jana Hakim and radiation oncologist Dr. Isidore Moos.    #) Left breast erythema- Afebrile here without antipyretic PTA. Exam is concerning for left breast cellulitis.  There is no abscess or palpable fluid collection.  Dr. Jana Hakim placed orders for patient to receive 10 days of IV Vancomycin.  She will undergo PICC line placement tomorrow and referral has been placed for  ID.  Patient given first dose of antibiotics here.  Labs were collected prior to my evaluation and show WBC of 9.9, this is not technically outside normal range however is a slight bump from labs x11 days ago and it was 7.3 which could favor infection in conjunction with absolute neutrophil count of 7.9.  CBC also shows hemoglobin of 11.5.  Patient has no history of anemia, although from prior chart review baseline appears to be between 12 and 13.  Discussed results with patient and recommend she follow-up with PCP for further trending of this.  CMP is overall unremarkable.   #) Malignant neoplasm of upper-outer quadrant of left breast, estrogen receptor positive-continue tamoxifen break as recommended by radiation oncologist Dr. Isidore Moos.  Also recommend patient continue dexamethasone taper after discussing this with Dr. Isidore Moos  to rule out possibility of radiation recall.  Patient is agreeable with plan of care.Patient has appointments scheduled for later this month with primary oncologist, encouraged her to keep those appointments.   Visit Diagnosis: 1. Cellulitis of left breast   2. Malignant neoplasm  of upper-outer quadrant of left breast in female, estrogen receptor positive ([place])      No orders of the defined types were placed in this encounter.   All questions were answered. The patient knows to call the clinic with any problems, questions or concerns. No barriers to learning was detected.  I have spent a total of 30 minutes minutes of face-to-face and non-face-to-face time, preparing to see the patient, obtaining and/or reviewing separately obtained history, performing a medically appropriate examination, counseling and educating the patient, ordering tests,  documenting clinical information in the electronic health record, and care coordination.     Thank you for allowing me to participate in the care of this patient.    Barrie Folk, PA-C Department of  Hematology/Oncology [place] at [facility] Phone: [phone]   02/24/2021 3:55 PM

## 2021-02-25 ENCOUNTER — Ambulatory Visit (HOSPITAL_COMMUNITY)
Admission: RE | Admit: 2021-02-25 | Discharge: 2021-02-25 | Disposition: A | Discharge status: Home / Self Care | Payer: 59 | Source: Ambulatory Visit | Attending: Oncology | Admitting: Oncology

## 2021-02-25 ENCOUNTER — Encounter: Payer: Self-pay | Admitting: Oncology

## 2021-02-25 ENCOUNTER — Other Ambulatory Visit: Payer: Self-pay | Admitting: Oncology

## 2021-02-25 ENCOUNTER — Encounter: Payer: Self-pay | Admitting: Internal Medicine

## 2021-02-25 ENCOUNTER — Ambulatory Visit (INDEPENDENT_AMBULATORY_CARE_PROVIDER_SITE_OTHER): Payer: 59 | Admitting: Internal Medicine

## 2021-02-25 ENCOUNTER — Other Ambulatory Visit: Payer: Self-pay

## 2021-02-25 VITALS — BP 114/66 | HR 65 | Temp 98.3°F | Wt 156.0 lb

## 2021-02-25 DIAGNOSIS — N61 Mastitis without abscess: Secondary | ICD-10-CM | POA: Insufficient documentation

## 2021-02-25 DIAGNOSIS — C50412 Malignant neoplasm of upper-outer quadrant of left female breast: Secondary | ICD-10-CM

## 2021-02-25 DIAGNOSIS — L589 Radiodermatitis, unspecified: Secondary | ICD-10-CM | POA: Diagnosis not present

## 2021-02-25 DIAGNOSIS — Z170 Estrogen receptor positive status [ER+]: Secondary | ICD-10-CM | POA: Diagnosis not present

## 2021-02-25 DIAGNOSIS — L03313 Cellulitis of chest wall: Secondary | ICD-10-CM | POA: Diagnosis not present

## 2021-02-25 MED ORDER — LIDOCAINE HCL 1 % IJ SOLN
INTRAMUSCULAR | Status: AC
  Filled 2021-02-25: qty 20

## 2021-02-25 MED ORDER — HEPARIN SOD (PORK) LOCK FLUSH 100 UNIT/ML IV SOLN
INTRAVENOUS | Status: AC
  Filled 2021-02-25: qty 5

## 2021-02-25 NOTE — Progress Notes (Signed)
[facility]  Reason for Consult: Possible cellulitis  Referring Provider: Dr Jana Hakim   HPI:    Stephanie [name] is a 55 y.o. female with PMHx as below who presents to the clinic for further evaluation of possible left breast cellulitis.   Patient has a history of left-sided breast cancer diagnosed in September 2020 status post lumpectomy 01/2019 and adjuvant radiation therapy 02/2019 to 03/2019 and subsequently started on tamoxifen in January 2021.  She has been following with her oncologist (Dr. Jana Hakim) and her OB/GYN since that time.    Most recently, her story has included a persistent/recurring left breast erythema and concern for non-resolving cellulitis that began this summer.  She developed postmenopausal bleeding around June/July 2022 that was evaluated by her OB/GYN.  In mid July, she underwent transvaginal ultrasound to further evaluate the bleeding.  She was noted to have thickening of the lining of the uterus and was scheduled for hysteroscopy and biopsy which was performed on 11/27/2020.  This was benign.  She was also advised to stop her tamoxifen around this time.  She did so for approximately 3 weeks before resuming this medication.  Approximately 3 weeks after resuming tamoxifen she developed signs and symptoms that was concerning for possible left breast cellulitis.  She also had a fever of about 102 degrees F.  There is a phone note from 8/24 OB/GYN which indicates patient reported unilateral breast pain and the suspicion was for mastitis.  She was prescribed 10 days of Augmentin.  This was changed to clindamycin 3 days later on 12/21/20 by her PCP Chevis Pretty FNP) as she was not improving.  This resulted in diarrhea but she managed to get through the 10 day course of therapy and noted improvement in redness.  It is documented at 9/6 oncology follow up that although redness was not completely resolved there was improvement and further  antibiotics were held.  She then sent a MyChart message on 9/9 stating "sore under left arm this morning.  Low-grade temp 99 degrees.  Feeling crappy."  She was prescribed another 10-day course of clindamycin at that time.  Around 9/12 she reported that the redness had plateaued and she continued to improve on clindmamycin.  She underwent ultrasound on 9/26.  Ultrasound reports that there was no suspicious mass or fluid collection.  There is skin thickening which may be secondary to postradiation changes or possibly an early cellulitis.  On exam that day there was faint erythema of the left breast.    She was referred to Dr. Cristal Generous office for further evaluation.  He changed her to a prolonged course of doxycycline for 30 days.  However, she reported some worsening redness, lethargy, and low-grade fevers after several days of this and subsequently was changed to Bactrim which she took for a couple days before IV antibiotics were subsequently arranged.  When the redness reoccurred she reports that it went beyond the radiation field whereas previously it was confined to just the area where she had radiation.    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  At this point she was started on IV antibiotics which she did for 10 days with vancomycin and ceftriaxone daily infusions (10/12-10/22).  She reports that the erythema almost essentially resolved while on this antibiotic combination.    She thus completed IV antibiotics and then resumed oral doxycycline. In this interval  period she had an MRI of the breast 10/23 which showed no suspicious MRI findings.  The left breast had posttreatment changes with nonspecific left sided skin and trabecular thickening/edema most consistent with radiation changes.  She stopped the doxycycline on ~10/27.  She saw her radiation oncologist (Dr Isidore Moos) on 10/28 who raised the  possibility of radiation recall dermatitis and placed patient on dexamethasone taper for 2 weeks and encouraged her to take a tamoxifen break as these symptoms temporally started when she resumed her tamoxifen over the summertime after a brief hiatus as noted above.  She started the dexamethasone on Saturday 10/29.  When she woke up on Sunday morning she noticed mild redness to her left breast had returned.  She went to her oncologist office yesterday for further evaluation and has been started back on IV vancomycin 1250mg  q12h for 10 days.  She had a PICC line placed today to accommodate the IV antibiotics.    During this period since August she has had no evidence of leukocytosis.  There was some concern yesterday that her WBC had increased from 7.3 up to 9.9, however, she has now been on dexamethasone since 10/29.  Since getting the dose of vancomycin yesterday, she reports the redness has improved in her breast.  She is not having any fevers currently and no significant pain.     Approximate courses for antibiotics:  8/24-8/27 Augmentin 8/27-9/6 Clindamycin 9/9-9/18 Clindamycin 9/27-9/29 Augmentin 9/29-10/11 Doxycycline 10/11-10/12 Bactrim 10/12-10/22 Vancomycin 1gm daily IV and Ceftriaxone 2gm daily IV 10/23-10/27 doxycycline 10/31- present Vancomycin 1250mg  q12h IV       Patient's Medications  New Prescriptions   No medications on file  Previous Medications   ALENDRONATE (FOSAMAX) 70 MG TABLET    TAKE 1 TABLET BY MOUTH ONCE A WEEK. TAKE WITH A FULL GLASS OF WATER ON AN EMPTY STOMACH   BIOTIN W/ VITAMINS C & E (HAIR/SKIN/NAILS PO)    Take 1 tablet by mouth daily.    CALCIUM CARB-CHOLECALCIFEROL (CALCIUM + D3 PO)    Take 1 tablet by mouth daily.    CALCIUM CARBONATE (OS-CAL) 1250 (500 CA) MG CHEWABLE TABLET    Chew 1 tablet by mouth daily.   CHOLESTYRAMINE (QUESTRAN) 4 G PACKET    Take 1 packet (4 g total) by mouth 2 (two) times daily.   CLOBETASOL (TEMOVATE) 0.05 % GEL     Apply 1 Dose topically 2 (two) times daily. 2x/day x 3-4 wks, then 1x/day x 4 wks, then every other day x  4 wks, then 2x/wk   DEXAMETHASONE (DECADRON) 2 MG TABLET    Take 2 tablets 3 times daily for 3 days, then 2 tablets twice daily for 3 days, then 2 tablets daily for 3 days, then 1 tablet daily for 6 days, then stop. Take with food.   ESTRADIOL (ESTRING) 2 MG VAGINAL RING    PLACE 1 RING VAGINALLY EVERY 3 MONTHS, FOLLOW PACKAGE DIRECTIONS   GABAPENTIN (NEURONTIN) 300 MG CAPSULE    TAKE 1 CAPSULE BY MOUTH AT BEDTIME   OMEPRAZOLE (PRILOSEC) 20 MG CAPSULE    Take 1 capsule (20 mg total) by mouth daily. Take to prevent upset stomach while on Dexamethasone.   ONDANSETRON (ZOFRAN-ODT) 4 MG DISINTEGRATING TABLET    Take 1 tablet (4 mg total) by mouth every 8 (eight) hours as needed for nausea for up to 7 days.   SERTRALINE (ZOLOFT) 100 MG TABLET    Take 1 tablet by mouth daily.   TAMOXIFEN (NOLVADEX) 20  MG TABLET    TAKE 1 TABLET BY MOUTH ONCE DAILY   TURMERIC PO    Take by mouth.   VANCOMYCIN IVPB    Inject into the vein.  Modified Medications   No medications on file  Discontinued Medications   AMOXICILLIN-CLAVULANATE (AUGMENTIN) 875-125 MG TABLET    Take 1 tablet by mouth 2 (two) times daily.   DOXYCYCLINE (VIBRAMYCIN) 100 MG CAPSULE    Take 1 capsule (100 mg total) by mouth 2 (two) times daily for 30 days   FLUCONAZOLE (DIFLUCAN) 150 MG TABLET    Take 1 tablet (150 mg total) by mouth daily.   SULFAMETHOXAZOLE-TRIMETHOPRIM (BACTRIM DS) 800-160 MG TABLET    Take 1 tablet by mouth every 12 hours for 10 days.      Past Medical History:  Diagnosis Date   Anxiety    Breast mass, left    Cancer ([place])    Complication of anesthesia    felt lethargic x 4 days following last surgery in 2017, but 2018 did okay   Depression    Headache    migraines   History of radiation therapy 03/13/19- 04/11/19   Left Breast 16 fraction of 2. 66 Gy each to total 42.56 Gy. Left breast boost 4 fractions of 2 Gy to  total 8 Gy.    Osteopenia     Social History   Tobacco Use   Smoking status: Never   Smokeless tobacco: Never  Vaping Use   Vaping Use: Never used  Substance Use Topics   Alcohol use: No    Comment: rare use   Drug use: No    Family History  Problem Relation Age of Onset   Depression Brother    Atrial fibrillation Brother    Hypertension Mother    Osteoporosis Mother    ADD / ADHD Son    ADD / ADHD Daughter    Diabetes Maternal Grandmother    Diabetes Maternal Grandfather    Diabetes Paternal Grandmother    Diabetes Paternal Grandfather    Parkinson's disease Maternal Aunt     Allergies  Allergen Reactions   Wellbutrin Xl [Bupropion] Hives    Review of Systems  All other systems reviewed and are negative. Except as noted in HPI.    OBJECTIVE:    Vitals:   02/25/21 1542  BP: 114/66  Pulse: 65  Temp: 98.3 F (36.8 C)  TempSrc: Oral  Weight: 156 lb (70.8 kg)     Body mass index is 27.63 kg/m.  Physical Exam Constitutional:      General: She is not in acute distress.    Appearance: Normal appearance.  HENT:     Head: Normocephalic and atraumatic.  Pulmonary:     Effort: Pulmonary effort is normal. No respiratory distress.  Musculoskeletal:     Comments: Right UE PICC in place.   Skin:    General: Skin is warm and dry.     Findings: Erythema present.     Comments: Faint area of erythema outlining area of prior radiation therapy. Superior to this area there is a more pronounced area of erythema that is blanching.  No drainage or open wounds.  No significant swelling or induration.   Neurological:     General: No focal deficit present.     Mental Status: She is alert and oriented to person, place, and time.  Psychiatric:        Mood and Affect: Mood normal.        Behavior: Behavior  normal.     Labs and Microbiology:  CBC Latest Ref Rng & Units 02/24/2021 02/13/2021 12/31/2020  WBC 4.0 - 10.5 K/uL 9.9 7.3 7.2  Hemoglobin 12.0 - 15.0 g/dL  11.5(L) 12.2 12.0  Hematocrit 36.0 - 46.0 % 32.7(L) 36.9 35.8(L)  Platelets 150 - 400 K/uL 318 397 400   CMP Latest Ref Rng & Units 02/24/2021 02/13/2021 12/31/2020  Glucose 70 - 99 mg/dL 93 55(L) 89  BUN 6 - 20 mg/dL 17 16 12   Creatinine 0.44 - 1.00 mg/dL 0.70 0.77 0.80  Sodium 135 - 145 mmol/L 138 144 144  Potassium 3.5 - 5.1 mmol/L 3.6 3.7 4.4  Chloride 98 - 111 mmol/L 102 106 103  CO2 22 - 32 mmol/L 27 28 28   Calcium 8.9 - 10.3 mg/dL 8.9 9.0 10.2  Total Protein 6.5 - 8.1 g/dL 6.8 - 6.9  Total Bilirubin 0.3 - 1.2 mg/dL 0.4 - 0.3  Alkaline Phos 38 - 126 U/L 56 - 73  AST 15 - 41 U/L 43(H) - 47(H)  ALT 0 - 44 U/L 47(H) - 33       ASSESSMENT & PLAN:    1. Cellulitis of left breast  2. Malignant neoplasm of upper-outer quadrant of left breast in female, estrogen receptor positive ([place])  3. Radiation dermatitis  She has completed several courses of both PO and IV antibiotics for erythema of her left breast that has been concerning for cellulitis but at the same time an obvious infection has not been proven.  Imaging, punch biopsy, and labs have been overall reassuring .  However, she is able to clearly describe improvement with certain antibiotics and worsening with others.  She may be dealing with a slow to resolve cellulitis due to post-radiation changes that require a longer course of therapy than would be anticipated for standard cellulitis.  She got better but did not have complete resolution with Clindamycin, which should have good Strep/Staph coverage.  However, she then worsened on doxycycline which is not ideal for Strep coverage (or could be MRSA with doxy resistance) before nearly having resolution on Vancomycin/Ceftriaxone; both of which have good Strep/Staph coverage.  If this is MRSA then the vancomycin dosing at 1gm daily was likely not therapeutic thus possibly explaining lack of complete resolution as well.    She has now been re-started on 10 days of vancomycin 1250mg   q12h per oncology (our pharmacist also agrees with dose).  This will provide good coverage and I don't think broadening to include expanded GNR coverage is necessary since vancomcycin is good for Staph and Strep.   She is also on a steroid taper per radiation oncology due the concern for recall radiation dermatitis so it may be challenging if she improves to determine the exact etiology.  I will call her next week to see how she is doing and then follow up on 03/12/21.  Raynelle Highland for Infectious Disease [facility] 02/25/2021, 4:52 PM   I spent 60 minutes dedicated to the care of this patient on the date of this encounter to include pre-visit review of records, face-to-face time with the patient discussing cellulitis, radiation dermatitis, hx of breast cancer, and post-visit ordering of testing.

## 2021-02-25 NOTE — Patient Instructions (Signed)
Thank you for coming to see me today. It was a pleasure seeing you.  To Do: Continue vancomycin via PICC line for now I'll call you early next week to discuss next steps Follow up with me on the 16th  If you have any questions or concerns, please do not hesitate to call the office at [phone].  Take Care,   Jule Ser

## 2021-02-25 NOTE — Procedures (Signed)
Pre procedural Diagnosis: Poor venous access Post Procedural Diagnosis: Same  Successful placement of right basilic vein approach 40 cm single lumen PICC line with tip at the superior caval-atrial junction.    EBL: None  No immediate post procedural complication.  The PICC line is ready for immediate use.  Jay Zulema [place], MD Pager #: [phone]   

## 2021-02-26 ENCOUNTER — Ambulatory Visit: Payer: 59 | Admitting: Internal Medicine

## 2021-02-26 ENCOUNTER — Telehealth: Payer: Self-pay | Admitting: *Deleted

## 2021-02-26 ENCOUNTER — Encounter: Payer: Self-pay | Admitting: Oncology

## 2021-02-26 NOTE — Telephone Encounter (Signed)
This RN spoke with pt per her My chart message stating extreme fatigue occurring on day 2 of taper of decadron from 2 tabs tid to 2 tabs bid.  This RN informed above likely cause- if worse in am she is to alert this office and take 1 extra of the 2 mg decadron for further instructions.  Stephanie [name] verbalized understanding.

## 2021-03-03 ENCOUNTER — Encounter: Payer: Self-pay | Admitting: Oncology

## 2021-03-04 ENCOUNTER — Telehealth: Payer: Self-pay | Admitting: Pharmacist

## 2021-03-04 ENCOUNTER — Telehealth: Payer: Self-pay | Admitting: *Deleted

## 2021-03-04 DIAGNOSIS — N61 Mastitis without abscess: Secondary | ICD-10-CM | POA: Diagnosis not present

## 2021-03-04 NOTE — Telephone Encounter (Signed)
Stephanie [name] is requesting to have [facility] administer IVF at home. States they came to draw her vancomycin levels this morning and had to stick her several times. She is not nauseated, just cannot get in enough fluids.

## 2021-03-04 NOTE — Telephone Encounter (Signed)
Dr Jana Hakim is OK with her receiving IVF. [facility] called at [phone]. Spoke with RN working with [name]. She said [facility] will be administering IVF. They will cal Dr Magrinat's office for orders.

## 2021-03-04 NOTE — Telephone Encounter (Signed)
Called and spoke to [place], pharmacist at [facility], and gave verbal orders per Dr. Juleen China to extend patient's IV vancomycin for 14 days total. Stop date is now 11/13. Ok to pull PICC at end of treatment. [facility] verbalized understanding and repeated orders.   Malya Cirillo L. [name] Casto, PharmD [facility] Clinical Pharmacist Practitioner

## 2021-03-05 ENCOUNTER — Encounter: Payer: Self-pay | Admitting: Oncology

## 2021-03-06 DIAGNOSIS — N61 Mastitis without abscess: Secondary | ICD-10-CM | POA: Diagnosis not present

## 2021-03-07 ENCOUNTER — Telehealth: Payer: Self-pay | Admitting: Pharmacist

## 2021-03-07 NOTE — Telephone Encounter (Signed)
[facility] results from 11/8 show vancomycin trough 32.8 and increase in Scr to 1.29 from baseline ~0.7 indicating AKI. Patient is holding vancomycin at this time. After discussion with Dr. Juleen China, will hold all further vancomycin as patient's stop date was 11/13 and has ample therapeutic concentrations to last through Sunday. LVM with Jeani Hawking from [facility] and provided verbal order to collect repeat labs prior to PICC removal per Dr. Juleen China.  Alfonse Spruce, PharmD, CPP Clinical Pharmacist Practitioner [facility]

## 2021-03-09 NOTE — Progress Notes (Signed)
Radiation Oncology         [phone] ________________________________  Name: Stephanie [name] MRN: [id number]  Date: 03/10/2021  DOB: Dec 21, 1965  MyChart Video Visit - Re-consultation note  Outpatient  CC: [name], MD  [name], MD  Diagnosis and Prior Radiotherapy:    ICD-10-CM   1. Ductal carcinoma in situ (DCIS) of left breast  D05.12       Cancer Staging Malignant neoplasm of upper-outer quadrant of left breast in female, estrogen receptor positive ([place]) Staging form: Breast, AJCC 8th Edition - Clinical stage from 01/06/2019: Stage 0 (cTis (DCIS), cN0, cM0, ER+, PR+) - Signed by Gardenia Phlegm, NP on 01/18/2019 Stage prefix: Initial diagnosis - Pathologic stage from 02/09/2019: Stage 0 (pTis (DCIS), pN0, cM0, ER+, PR+) - Signed by Gardenia Phlegm, NP on 03/01/2019 Stage prefix: Initial diagnosis   Radiation Treatment Dates: 03/13/2019 through 04/11/2019 Site Technique Total Dose (Gy) Dose per Fx (Gy) Completed Fx Beam Energies  Breast, Left: Breast_Lt 3D 42.56/42.56 2.66 16/16 6X  Breast, Left: Breast_Lt_Bst 3D 8/8 2 4/4 6X    CHIEF COMPLAINT: Here for re-evaluation and surveillance of left breast DCIS and breast symptoms following 2 weeks of dexamethasone taper.   Narrative:  The patient returns today for re-evaluation, she was last seen on 02/21/21 for re-consultation. To review from our last visit, the patient had been taking tamoxifen for 1 - 1 1/2 years on and off; this was the only medication noted that she either restarted or started this past August prior to the start of her breast symptoms, fever, and chills. This being considered, I recommended that she hold her tamoxifen and start a steroid taper of dexamethasone which I prescribed for her to take for 2 weeks due to suspicion for radiation recall.   She started the dexamethasone taper on October 29.  Since then, the patient followed up with Sherol Dade, PA-C, on  02/24/21. During which time, the patient reported left breast erythema for 2 days. During this visit, the patient admitted to discontinuing her doxycycline after a 5 day course on 02/20/21. Patient noticed mild redness to her left breast when she woke up that past Sunday morning.  She denied any associated pain.  She stated that when she woke up the morning of this visit, the breast redness had slightly improved however was still present and she had a low-grade fever of 100.2. Patient reported that she called Dr. Virgie Dad office and it was recommended she have lab work and start treatment for breast cellulitis with vancomycin x10 days. Exam performed during this visit revealed findings concerning for left breast cellulitis. Per the visit note, Dr. Jana Hakim placed orders for the patient to receive 10 days of IV Vancomycin beginning on the day of this visit.  The patient was then referred by Dr. Jana Hakim, to Dr. Juleen China, Infectious Disease, on 02/25/21. During which time, the patient was noted to report improvement of left breast erythema following her first dose of vancomycin. Per Dr Alcario Drought visit note, the patient may possibly be dealing with a slow to resolve cellulitis due to post-radiation changes that require a longer course of therapy than would be anticipated for standard cellulitis. This is considered due to the fact that the patient reported improvement with certain antibiotics and worsening with others.   Now she reports that her breast symptoms are overall largely resolved.  Additionally her diarrhea and flulike symptoms have resolved with exception to occasional temps of 99 F.  She does however  report profound fatigue and this is highly discouraging to her.  She states it is exhausting even to walk across the house.  She is taking time off of work on temporary disability.  She does acknowledge that during the steroid taper (which she has completed) her sleep was poor.  She does state that she  is just starting to catch up on sleep but feels sleep deprived.  ALLERGIES:  is allergic to wellbutrin xl [bupropion].  Meds: Current Outpatient Medications  Medication Sig Dispense Refill   alendronate (FOSAMAX) 70 MG tablet TAKE 1 TABLET BY MOUTH ONCE A WEEK. TAKE WITH A FULL GLASS OF WATER ON AN EMPTY STOMACH 12 tablet 3   Biotin w/ Vitamins C & E (HAIR/SKIN/NAILS PO) Take 1 tablet by mouth daily.      Calcium Carb-Cholecalciferol (CALCIUM + D3 PO) Take 1 tablet by mouth daily.      calcium carbonate (OS-CAL) 1250 (500 Ca) MG chewable tablet Chew 1 tablet by mouth daily.     cholestyramine (QUESTRAN) 4 g packet Take 1 packet (4 g total) by mouth 2 (two) times daily. (Patient not taking: Reported on 02/25/2021) 12 each 1   clobetasol (TEMOVATE) 0.05 % GEL Apply 1 Dose topically 2 (two) times daily. 2x/day x 3-4 wks, then 1x/day x 4 wks, then every other day x  4 wks, then 2x/wk (Patient not taking: Reported on 02/25/2021) 60 g 3   dexamethasone (DECADRON) 2 MG tablet Take 2 tablets 3 times daily for 3 days, then 2 tablets twice daily for 3 days, then 2 tablets daily for 3 days, then 1 tablet daily for 6 days, then stop. Take with food. 42 tablet 0   estradiol (ESTRING) 2 MG vaginal ring PLACE 1 RING VAGINALLY EVERY 3 MONTHS, FOLLOW PACKAGE DIRECTIONS (Patient not taking: Reported on 02/25/2021) 1 each 12   gabapentin (NEURONTIN) 300 MG capsule TAKE 1 CAPSULE BY MOUTH AT BEDTIME 90 capsule 4   omeprazole (PRILOSEC) 20 MG capsule Take 1 capsule (20 mg total) by mouth daily. Take to prevent upset stomach while on Dexamethasone. 15 capsule 0   ondansetron (ZOFRAN-ODT) 4 MG disintegrating tablet Take 1 tablet (4 mg total) by mouth every 8 (eight) hours as needed for nausea for up to 7 days. 20 tablet 1   sertraline (ZOLOFT) 100 MG tablet Take 1 tablet by mouth daily. 90 tablet 1   tamoxifen (NOLVADEX) 20 MG tablet TAKE 1 TABLET BY MOUTH ONCE DAILY 90 tablet 12   TURMERIC PO Take by mouth.      vancomycin IVPB Inject into the vein.     No current facility-administered medications for this encounter.   Facility-Administered Medications Ordered in Other Encounters  Medication Dose Route Frequency Provider Last Rate Last Admin   0.9 %  sodium chloride infusion   Intravenous Continuous Magrinat, Virgie Dad, MD 20 mL/hr at 02/06/21 1648 Infusion Verify at 02/06/21 1648    Physical Findings: The patient is in no acute distress. Patient is alert and oriented.  vitals were not taken for this visit. .     Left breast photo from 02-21-21       Left breast photo from 03-10-21   Lab Findings: Lab Results  Component Value Date   WBC 9.9 02/24/2021   HGB 11.5 (L) 02/24/2021   HCT 32.7 (L) 02/24/2021   MCV 89.1 02/24/2021   PLT 318 02/24/2021    Radiographic Findings: MR BREAST BILATERAL W WO CONTRAST INC CAD  Result Date: 02/17/2021 CLINICAL DATA:  55 year old female with history of recent recurrent left breast infection. Patient has a history of left breast DCIS, status post lumpectomy in 2020. LABS:  None performed on site. EXAM: BILATERAL BREAST MRI WITH AND WITHOUT CONTRAST TECHNIQUE: Multiplanar, multisequence MR images of both breasts were obtained prior to and following the intravenous administration of 7 ml of Gadavist. Three-dimensional MR images were rendered by post-processing of the original MR data on an independent workstation. The three-dimensional MR images were interpreted, and findings are reported in the following complete MRI report for this study. Three dimensional images were evaluated at the independent interpreting workstation using the DynaCAD thin client. COMPARISON:  Previous exam(s). FINDINGS: Breast composition: c. Heterogeneous fibroglandular tissue. Background parenchymal enhancement: Mild. Right breast: No suspicious mass or abnormal enhancement. Left breast: No suspicious mass or abnormal enhancement. Stable post lumpectomy changes in the superior  central left breast. There is diffuse left-sided skin and trabecular thickening and edema. This is a nonspecific finding and may be related to prior radiation related changes. No suspicious or associated skin enhancement. Lymph nodes: No abnormal appearing lymph nodes. Ancillary findings:  None. IMPRESSION: 1. No suspicious MRI findings in either breast. 2. Left breast posttreatment changes. Nonspecific left-sided skin and trabecular thickening/edema is most consistent with postradiation changes. RECOMMENDATION: 1. Clinical and symptomatic follow-up for the patient's left breast symptoms. 2. Otherwise, she may return to routine annual screening, due in September 2023. BI-RADS CATEGORY  2: Benign. Electronically Signed   By: Kristopher Oppenheim M.D.   On: 02/17/2021 10:18  IR PICC PLACEMENT RIGHT >5 YRS INC IMG GUIDE  Result Date: 02/25/2021 INDICATION: Poor venous access. Cellulitis of the left breast. In need durable intravenous access for antibiotic administration. EXAM: ULTRASOUND AND FLUOROSCOPIC GUIDED PICC LINE INSERTION MEDICATIONS: None. CONTRAST:  None FLUOROSCOPY TIME:  18 seconds (1 mGy) COMPLICATIONS: None immediate. TECHNIQUE: The procedure, risks, benefits, and alternatives were explained to the patient and informed written consent was obtained. A timeout was performed prior to the initiation of the procedure. The right upper extremity was prepped with chlorhexidine in a sterile fashion, and a sterile drape was applied covering the operative field. Maximum barrier sterile technique with sterile gowns and gloves were used for the procedure. A timeout was performed prior to the initiation of the procedure. Local anesthesia was provided with 1% lidocaine. Under direct ultrasound guidance, the basilic vein was accessed with a micropuncture kit after the overlying soft tissues were anesthetized with 1% lidocaine. Real-time ultrasound guidance was utilized for vascular access including the acquisition of a  permanent ultrasound image documenting patency of the accessed vessel. A guidewire was advanced to the level of the superior caval-atrial junction for measurement purposes and the PICC line was cut to length. A peel-away sheath was placed and a 40 cm, 5 [place], single lumen was inserted to level of the superior caval-atrial junction. A post procedure spot fluoroscopic was obtained. The catheter easily aspirated and flushed and was secured in place with stat lock device. A dressing was applied. The patient tolerated the procedure well without immediate post procedural complication. FINDINGS: After catheter placement, the tip lies within the superior cavoatrial junction. The catheter aspirates and flushes normally and is ready for immediate use. IMPRESSION: Successful ultrasound and fluoroscopic guided placement of a right basilic vein approach, 40 cm, 5 [place], single lumen PICC with tip at the superior caval-atrial junction. The PICC line is ready for immediate use. Electronically Signed   By: Sandi Mariscal M.D.   On: 02/25/2021 11:42  Impression/Plan: This is a very nice 55 year old woman with a history of DCIS that was treated with adjuvant radiation to the left breast 2 years ago.     She has had flulike systemic symptoms in addition to diarrhea as well as symptoms in her breast suggestive of cellulitis versus radiation recall with varying improvement on different antibiotics.  When I saw her at the end of October I instructed her to stop tamoxifen in case this had triggered radiation recall (rash seemed to mimic RT fields) and started her on a dexamethasone taper.  2 days after starting dexamethasone she developed worsening breast symptoms again.  The  symptoms improved with IV vancomycin and have fortunately not returned (although she is now struggling with profound fatigue).  This is a murky picture and it is difficult to diagnose the underlying etiology of her symptoms.  Because her symptoms initially  worsened on dexamethasone and then improved on vancomycin the picture is not very consistent with radiation recall.  Nevertheless I recommended that she continue to stay off the tamoxifen for a period of time to ensure that her breast symptoms do not return/ get triggered again.  If she resumes tamoxifen she knows to do this with caution and close observation by medical oncology.  For her fatigue I recommended that she prioritize excellent sleep hygiene and 9 to 10 hours of sleep per night. We talked about the importance of surrounding herself by a strong supportive network for her emotional health.  She will continue to follow closely with infectious disease and medical oncology.  I will see her back on an as-needed basis.  She is pleased with this plan and expressed gratitude for her care.  This encounter was provided by telemedicine platform; patient desired telemedicine during pandemic precautions.  MyChart video was used. The patient has given verbal consent for this type of encounter and has been advised to only accept a meeting of this type in a secure network environment. On date of service, in total, I spent 30 minutes on this encounter.   The attendants for this meeting include Eppie Gibson  and Ernst Bowler During the encounter, Eppie Gibson was located at [place] [facility].   _____________________________________   Eppie Gibson, MD  This document serves as a record of services personally performed by Eppie Gibson, MD. It was created on her behalf by Roney Mans, a trained medical scribe. The creation of this record is based on the scribe's personal observations and the provider's statements to them. This document has been checked and approved by the attending provider.

## 2021-03-10 ENCOUNTER — Telehealth: Payer: Self-pay

## 2021-03-10 ENCOUNTER — Ambulatory Visit: Payer: 59

## 2021-03-10 ENCOUNTER — Ambulatory Visit: Payer: 59 | Admitting: Radiation Oncology

## 2021-03-10 ENCOUNTER — Ambulatory Visit
Admission: RE | Admit: 2021-03-10 | Discharge: 2021-03-10 | Disposition: A | Discharge status: Home / Self Care | Payer: 59 | Source: Ambulatory Visit | Attending: Radiation Oncology | Admitting: Radiation Oncology

## 2021-03-10 DIAGNOSIS — Z08 Encounter for follow-up examination after completed treatment for malignant neoplasm: Secondary | ICD-10-CM | POA: Diagnosis not present

## 2021-03-10 DIAGNOSIS — D0512 Intraductal carcinoma in situ of left breast: Secondary | ICD-10-CM

## 2021-03-10 NOTE — Telephone Encounter (Signed)
Per Dr. Juleen China, ok to pull PICC as patient has completed IV vancomycin. RN gave verbal order to [place] with [facility]. Spoke with patient and reminded her of follow up appointment on 11/16.   Kassie Keng Lorita Officer, RN

## 2021-03-10 NOTE — Telephone Encounter (Signed)
Labs are back from 11/10 - WBC 12.8, vanc trough 35.2, SCr is better at 1.07. She will have enough vanc for a good bit.

## 2021-03-11 ENCOUNTER — Encounter: Payer: Self-pay | Admitting: Radiation Oncology

## 2021-03-11 ENCOUNTER — Other Ambulatory Visit: Payer: Self-pay

## 2021-03-11 DIAGNOSIS — N61 Mastitis without abscess: Secondary | ICD-10-CM | POA: Diagnosis not present

## 2021-03-11 DIAGNOSIS — D0512 Intraductal carcinoma in situ of left breast: Secondary | ICD-10-CM | POA: Insufficient documentation

## 2021-03-11 DIAGNOSIS — C50412 Malignant neoplasm of upper-outer quadrant of left female breast: Secondary | ICD-10-CM

## 2021-03-11 NOTE — Progress Notes (Signed)
Added labs

## 2021-03-11 NOTE — Progress Notes (Signed)
[place]  [phone] [phone]) [phone]     ID: Stephanie [name] DOB: 01-Aug-1965  MR#: [id number]  ELT#:[id number]  Patient Care Team: [name], MD as PCP - General (Family Medicine) Kennith Center, RD as Dietitian (Family Medicine) Donnamae Jude, MD as Consulting Physician (Obstetrics and Gynecology) Kristiann Noyce, Virgie Dad, MD as Consulting Physician (Oncology) [name], MD as Consulting Physician (General Surgery) Eppie Gibson, MD as Attending Physician (Radiation Oncology) Chauncey Cruel, MD OTHER MD:  CHIEF COMPLAINT: Ductal carcinoma in situ  CURRENT TREATMENT: tamoxifen    INTERVAL HISTORY: Stephanie "Stephanie [name]" returns today for follow up of her noninvasive breast cancer.  She has had significant problems with left breast cellulitis, which was resistant to multiple antibiotics (initially Augmentin, then clindamycin, then doxycycline, then Bactrim), and finally required intravenous vancomycin and ceftriaxone starting on 02/05/2021.  There was improvement and she was placed again on doxycycline, but with recurrence of the cellulitis she had a PICC placed 02/25/2021 and was started on vancomycin at 1.250 twice daily for longer period.  This has now been discontinued and the PICC line was just removed yesterday.  She is ready to resumed her a prior treatment.  Incidentally she also had  Additional studies included a punch biopsy of the skin and an MRI of the breast which showed no fluid collection cyst or other abnormality of concern.  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  She just came off that medication.  She saw Dr. Jule Ser 02/25/2021.  Please see his very helpful note  Recall she had hysteroscopy and biopsy 11/27/2020 which showed benign tissue in the uterine lining.  Her tamoxifen had been briefly interrupted and was then resumed, then again interrupted during the  cellulitis.  She is now ready to resume it   REVIEW OF SYSTEMS: Stephanie [name] feels extremely fatigued, doubtless because she is just coming off the steroids.  She thinks she may be able to get back to work in a month or so but right now just getting around the house is a chore for her.  She denies any other systemic symptoms.   COVID 19 VACCINATION STATUS:    HISTORY OF CURRENT ILLNESS: From the original intake note:  ZACARI RADICK has a history of left breast lumpectomy ([id number]) performed on 08/15/2015 for fibroscystic changes with calcifications. She underwent a second left breast lumpectomy on 12/30/2016 [phone]) for lobular neoplasia, complex sclerosing lesion, and fibrocystic change and sclerosing adenosis with calcifications.  She had routine screening mammography on 12/14/2017 showing a possible abnormality in the left breast. She underwent left diagnostic mammogram on 01/27/2018, which showed: breast density category C; probably benign left breast calcifications, most likely fat necrosis (measurements not given). Short term follow up was recommended.  She returned for follow up on 09/29/2018 (likely delayed due to the pandemic) and underwent diagnostic left mammogram. This showed: breast density category C; unchanged 1.5 cm group of calcifications at the lumpectomy site; 1.2 cm group of calcifications within the anterior upper-outer left breast. Short term follow up was again recommended.  She bilateral diagnostic mammography with tomography at [facility] on 01/03/2019 showing: breast density category C; stable probably benign calcifications at the left lumpectomy site; 1.5 cm grouped calcifications within the upper-outer quadrant of the left breast with suspicious morphology and distribution; no evidence of right breast malignancy.   Accordingly on 01/06/2019 she proceeded to biopsy of the left breast area in question.  The pathology from this procedure ([id number]) showed: ductal carcinoma  in situ, high grade. Prognostic indicators significant for: estrogen receptor, 100% positive and progesterone receptor, 80% positive, both with strong staining intensity.   She underwent genetic counseling on 01/16/2019, which showed negative results.  She also underwent biopsy of the calcifications at the left lumpectomy site on 01/17/2019. Pathology [id number]) showed: focal fibrocystic changes; dense fibrosis with pigmented histiocytes and foreign body giant cells.  She opted to proceed with left breast lumpectomy on 02/09/2019 under Dr. Donne Hazel. Pathology from the procedure ([id number]) showed:  1. Left Breast, lumpectomy  - fibrocystic changes 2. Left Breast, lumpectomy  - ductal carcinoma in situ with calcifications, intermediate grade, spanning 2.4 cm  - lobular neoplasia  -negative resection margins   The patient's subsequent history is as detailed below.   PAST MEDICAL HISTORY: Past Medical History:  Diagnosis Date   Anxiety    Breast mass, left    Cancer ([place])    Complication of anesthesia    felt lethargic x 4 days following last surgery in 2017, but 2018 did okay   Depression    Headache    migraines   History of radiation therapy 03/13/19- 04/11/19   Left Breast 16 fraction of 2. 66 Gy each to total 42.56 Gy. Left breast boost 4 fractions of 2 Gy to total 8 Gy.    Osteopenia     PAST SURGICAL HISTORY: Past Surgical History:  Procedure Laterality Date   BREAST BIOPSY Left 07/10/2015   high risk stereo   BREAST EXCISIONAL BIOPSY Left 08/15/2015   ADH   BREAST LUMPECTOMY WITH RADIOACTIVE SEED LOCALIZATION Left 08/15/2015   Procedure:  LEFT BREAST LUMPECTOMY WITH RADIOACTIVE SEED LOCALIZATION;  Surgeon: Erroll Luna, MD;  Location: [facility];  Service: General;  Laterality: Left;   BREAST LUMPECTOMY WITH RADIOACTIVE SEED LOCALIZATION Left 02/09/2019   Procedure: LEFT BREAST LUMPECTOMY WITH RADIOACTIVE SEED LOCALIZATION;  Surgeon: [name], MD;  Location: [facility];  Service: General;  Laterality: Left;   EXCISION OF BREAST BIOPSY Left 12/30/2016   Benign ALH sclerosing lesion   RADIOACTIVE SEED GUIDED EXCISIONAL BREAST BIOPSY Left 12/30/2016   Procedure: RADIOACTIVE SEED GUIDED EXCISIONAL LEFT BREAST BIOPSY;  Surgeon: [name], MD;  Location: [facility];  Service: General;  Laterality: Left;   RADIOACTIVE SEED GUIDED EXCISIONAL BREAST BIOPSY Left 02/09/2019   Procedure: RADIOACTIVE SEED GUIDED EXCISIONAL LEFT  BREAST BIOPSY;  Surgeon: [name], MD;  Location: [facility];  Service: General;  Laterality: Left;   TONSILLECTOMY  Age 72   TRIGGER FINGER RELEASE  2010   Right Thumb    FAMILY HISTORY: Family History  Problem Relation Age of Onset   Depression Brother    Atrial fibrillation Brother    Hypertension Mother    Osteoporosis Mother    ADD / ADHD Son    ADD / ADHD Daughter    Diabetes Maternal Grandmother    Diabetes Maternal Grandfather    Diabetes Paternal Grandmother    Diabetes Paternal Grandfather    Parkinson's disease Maternal Aunt   Patient's father was 55 years old when he died from complications of cardiomyopathy.  He was adopted and there is no family history on his side. Patient's mother is 56 years old as of November 2020. The patient has 3 brothers, no sisters.  She denies/or notes a family hx of breast, prostate, pancreatic or ovarian cancer.    GYNECOLOGIC HISTORY:  Patient's last menstrual period was 01/25/2014 (approximate).  Menarche: 55 years old Age at first live birth: Any 55 years old GX P 2 LMP 49 Contraceptive oral contraceptives at least 7 years, with no complications HRT 9 months, discontinued because of malaise  Hysterectomy? no BSO?  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  She is currently doing preadmissions at the new [facility].  Her husband  Nicole Kindred works in Radio producer..  Daughter Apolonio Schneiders, 23, lives in [place] and works as a Immunologist.  Son [place], [place], with Asperger's, currently attends [employer], and lives at home with the patient.     ADVANCED DIRECTIVES: In the absence of any documents to the contrary the patient's husband is her healthcare power of attorney   HEALTH MAINTENANCE: Social History   Tobacco Use   Smoking status: Never   Smokeless tobacco: Never  Vaping Use   Vaping Use: Never used  Substance Use Topics   Alcohol use: No    Comment: rare use   Drug use: No     Colonoscopy: not on file  PAP: 08/2017, negative  Bone density: 11/2017, -2.0   Allergies  Allergen Reactions   Wellbutrin Xl [Bupropion] Hives    Current Outpatient Medications  Medication Sig Dispense Refill   estradiol (ESTRING) 2 MG vaginal ring Place 2 mg vaginally every 3 (three) months. follow package directions 1 each 12   alendronate (FOSAMAX) 70 MG tablet TAKE 1 TABLET BY MOUTH ONCE A WEEK. TAKE WITH A FULL GLASS OF WATER ON AN EMPTY STOMACH 12 tablet 3   Biotin w/ Vitamins C & E (HAIR/SKIN/NAILS PO) Take 1 tablet by mouth daily.      Calcium Carb-Cholecalciferol (CALCIUM + D3 PO) Take 1 tablet by mouth daily.      calcium carbonate (OS-CAL) 1250 (500 Ca) MG chewable tablet Chew 1 tablet by mouth daily.     gabapentin (NEURONTIN) 300 MG capsule TAKE 1 CAPSULE BY MOUTH AT BEDTIME 90 capsule 4   sertraline (ZOLOFT) 50 MG tablet Take 1 tablet (50 mg total) by mouth daily. 90 tablet 1   tamoxifen (NOLVADEX) 20 MG tablet TAKE 1 TABLET BY MOUTH ONCE DAILY 90 tablet 12   TURMERIC PO Take by mouth.     No current facility-administered medications for this visit.   Facility-Administered Medications Ordered in Other Visits  Medication Dose Route Frequency Provider Last Rate Last Admin   0.9 %  sodium chloride infusion   Intravenous Continuous Naturi Alarid, Virgie Dad, MD 20 mL/hr at 02/06/21 1648 Infusion Verify at 02/06/21 1648    OBJECTIVE:  White woman who appears younger than stated age 50:   03/12/21 1005  BP: (!) 141/84  Pulse: 97  Resp: 18  Temp: 97.7 F (36.5 C)  SpO2: 98%     Body mass index is 27.17 kg/m.   Wt Readings from Last 3 Encounters:  03/12/21 153 lb (69.4 kg)  03/12/21 153 lb 6.4 oz (69.6 kg)  02/25/21 156 lb (70.8 kg)     ECOG FS:1  Sclerae unicteric, EOMs intact Wearing a mask No cervical or supraclavicular adenopathy Lungs no rales or rhonchi Heart regular rate and rhythm Abd soft, nontender, positive bowel sounds MSK no focal spinal tenderness, no upper extremity lymphedema Neuro: nonfocal, well oriented, appropriate affect Breasts: The right breast is unremarkable.  The left breast has no erythema.  It is status post lumpectomy and radiation.  The cosmetic result  is excellent.  Both axillae are benign.   LAB RESULTS:  CMP     Component Value Date/Time   NA 139 03/12/2021 0937   NA 142 10/24/2019 1116   K 3.9 03/12/2021 0937   CL 101 03/12/2021 0937   CO2 26 03/12/2021 0937   GLUCOSE 147 (H) 03/12/2021 0937   BUN 31 (H) 03/12/2021 0937   BUN 11 10/24/2019 1116   CREATININE 1.25 (H) 03/12/2021 0937   CALCIUM 10.1 03/12/2021 0937   PROT 7.7 03/12/2021 0937   PROT 7.1 10/24/2019 1116   ALBUMIN 3.6 03/12/2021 0937   ALBUMIN 4.6 10/24/2019 1116   AST 40 03/12/2021 0937   ALT 83 (H) 03/12/2021 0937   ALKPHOS 99 03/12/2021 0937   BILITOT <0.2 (L) 03/12/2021 0937   GFRNONAA 51 (L) 03/12/2021 0937   GFRAA >60 01/24/2020 1033    No results found for: TOTALPROTELP, ALBUMINELP, A1GS, A2GS, BETS, BETA2SER, GAMS, MSPIKE, SPEI  No results found for: KPAFRELGTCHN, LAMBDASER, KAPLAMBRATIO  Lab Results  Component Value Date   WBC 9.2 03/12/2021   NEUTROABS 7.0 03/12/2021   HGB 12.8 03/12/2021   HCT 38.8 03/12/2021   MCV 92.4 03/12/2021   PLT 439 (H) 03/12/2021   No results found for: LABCA2  No components found for: RKYHCW237  No results for input(s): INR in the last 168  hours.  No results found for: LABCA2  No results found for: SEG315  No results found for: VVO160  No results found for: VPX106  No results found for: CA2729  No components found for: HGQUANT  No results found for: CEA1 / No results found for: CEA1   No results found for: AFPTUMOR  No results found for: CHROMOGRNA  No results found for: HGBA, HGBA2QUANT, HGBFQUANT, HGBSQUAN (Hemoglobinopathy evaluation)   No results found for: LDH  No results found for: IRON, TIBC, IRONPCTSAT (Iron and TIBC)  No results found for: FERRITIN  Urinalysis    Component Value Date/Time   LABSPEC 1.025 09/09/2009 1037   PHURINE 6.0 09/09/2009 1037   GLUCOSEU NEGATIVE 09/09/2009 1037   HGBUR TRACE (A) 09/09/2009 1037   BILIRUBINUR NEGATIVE 09/09/2009 [place] 09/09/2009 1037   PROTEINUR NEGATIVE 09/09/2009 1037   UROBILINOGEN 0.2 09/09/2009 1037   NITRITE NEGATIVE 09/09/2009 1037   LEUKOCYTESUR  09/09/2009 1037    NEGATIVE Biochemical Testing Only. Please order routine urinalysis from main lab if confirmatory testing is needed.    STUDIES: MR BREAST BILATERAL W WO CONTRAST INC CAD  Result Date: 02/17/2021 CLINICAL DATA:  55 year old female with history of recent recurrent left breast infection. Patient has a history of left breast DCIS, status post lumpectomy in 2020. LABS:  None performed on site. EXAM: BILATERAL BREAST MRI WITH AND WITHOUT CONTRAST TECHNIQUE: Multiplanar, multisequence MR images of both breasts were obtained prior to and following the intravenous administration of 7 ml of Gadavist. Three-dimensional MR images were rendered by post-processing of the original MR data on an independent workstation. The three-dimensional MR images were interpreted, and findings are reported in the following complete MRI report for this study. Three dimensional images were evaluated at the independent interpreting workstation using the DynaCAD thin client. COMPARISON:  Previous  exam(s). FINDINGS: Breast composition: c. Heterogeneous fibroglandular tissue. Background parenchymal enhancement: Mild. Right breast: No suspicious mass or abnormal enhancement. Left breast: No suspicious mass or abnormal enhancement. Stable post lumpectomy changes in the superior central left breast. There is diffuse left-sided skin and trabecular thickening and edema. This is a  nonspecific finding and may be related to prior radiation related changes. No suspicious or associated skin enhancement. Lymph nodes: No abnormal appearing lymph nodes. Ancillary findings:  None. IMPRESSION: 1. No suspicious MRI findings in either breast. 2. Left breast posttreatment changes. Nonspecific left-sided skin and trabecular thickening/edema is most consistent with postradiation changes. RECOMMENDATION: 1. Clinical and symptomatic follow-up for the patient's left breast symptoms. 2. Otherwise, she may return to routine annual screening, due in September 2023. BI-RADS CATEGORY  2: Benign. Electronically Signed   By: Kristopher Oppenheim M.D.   On: 02/17/2021 10:18  IR PICC PLACEMENT RIGHT >5 YRS INC IMG GUIDE  Result Date: 02/25/2021 INDICATION: Poor venous access. Cellulitis of the left breast. In need durable intravenous access for antibiotic administration. EXAM: ULTRASOUND AND FLUOROSCOPIC GUIDED PICC LINE INSERTION MEDICATIONS: None. CONTRAST:  None FLUOROSCOPY TIME:  18 seconds (1 mGy) COMPLICATIONS: None immediate. TECHNIQUE: The procedure, risks, benefits, and alternatives were explained to the patient and informed written consent was obtained. A timeout was performed prior to the initiation of the procedure. The right upper extremity was prepped with chlorhexidine in a sterile fashion, and a sterile drape was applied covering the operative field. Maximum barrier sterile technique with sterile gowns and gloves were used for the procedure. A timeout was performed prior to the initiation of the procedure. Local anesthesia was  provided with 1% lidocaine. Under direct ultrasound guidance, the basilic vein was accessed with a micropuncture kit after the overlying soft tissues were anesthetized with 1% lidocaine. Real-time ultrasound guidance was utilized for vascular access including the acquisition of a permanent ultrasound image documenting patency of the accessed vessel. A guidewire was advanced to the level of the superior caval-atrial junction for measurement purposes and the PICC line was cut to length. A peel-away sheath was placed and a 40 cm, 5 [place], single lumen was inserted to level of the superior caval-atrial junction. A post procedure spot fluoroscopic was obtained. The catheter easily aspirated and flushed and was secured in place with stat lock device. A dressing was applied. The patient tolerated the procedure well without immediate post procedural complication. FINDINGS: After catheter placement, the tip lies within the superior cavoatrial junction. The catheter aspirates and flushes normally and is ready for immediate use. IMPRESSION: Successful ultrasound and fluoroscopic guided placement of a right basilic vein approach, 40 cm, 5 [place], single lumen PICC with tip at the superior caval-atrial junction. The PICC line is ready for immediate use. Electronically Signed   By: Sandi Mariscal M.D.   On: 02/25/2021 11:42      ELIGIBLE FOR AVAILABLE RESEARCH PROTOCOL: no  ASSESSMENT: 55 y.o. [place] woman status post left breast biopsy 01/06/2019 ductal carcinoma in situ, grade 3, estrogen and progesterone receptor positive  (a) biopsy of a second area of calcifications in the left breast on 01/17/2019 -  (1) status post left lumpectomy 02/09/2019 2.4 cm ductal carcinoma in situ, grade 2, with negative margins  (2) adjuvant radiation 03/13/2019 - 04/11/2019  (a) left breast / 42.56 Gy in 16 fractions  (b) boost / 8 Gy in 4 fractions  (3) tamoxifen started 04/28/2019  (4) genetics testing 01/19/2019 through the  [facility] Breast Cancer STAT Panel + Common Hereditary Cancers Panel found no deleterious mutations in ATM, BRCA1, BRCA2, CDH1, CHEK2, PALB2, PTEN, STK11 and TP53 // APC, ATM, AXIN2, BARD1, BMPR1A, BRCA1, BRCA2, BRIP1, CDH1, CDKN2A (p14ARF), CDKN2A (p16INK4a), CKD4, CHEK2, CTNNA1, DICER1, EPCAM (Deletion/duplication testing only), GREM1 (promoter region deletion/duplication testing only), KIT, MEN1, MLH1, MSH2,  MSH3, MSH6, MUTYH, NBN, NF1, NHTL1, PALB2, PDGFRA, PMS2, POLD1, POLE, PTEN, RAD50, RAD51C, RAD51D, SDHB, SDHC, SDHD, SMAD4, SMARCA4. STK11, TP53, TSC1, TSC2, and VHL.  The following genes were evaluated for sequence changes only: SDHA and HOXB13 c.251G>A variant only.  (5) left breast cellulitis October 2022, requiring intravenous vancomycin, completed November 2022.  PLAN: Brihanna is just getting over her very difficult episode of cellulitis.  Not only the inflammation itself but also the antibiotic treatments and also the steroids significantly affected her quality of life and it will take several weeks before she can more fully recover and get back to work as she is hoping to do.  She is now going back on tamoxifen.  So long as she is on tamoxifen if she wishes to use Estring or similar vaginal estrogen I am comfortable with that.  This is commonly used undercover of estrogen with the understanding that we do not have data that it is safe and also we do not have data that it is not safe.  It is a quality-of-life issue for most patients as is the case here.  She is quite aware of this dilemma as we have previously discussed it and she is interested in going back on Estring which I was glad to write for her.  We are going to see her again in about 2 months just to make sure everything continues well and if so we can lengthen the follow-up interval from that point  Total encounter time 25 minutes.Sarajane Jews C. Keyvin Rison, MD 03/12/21 5:21 PM Medical Oncology and Hematology [facility] [place], [place] [address] Tel. [phone]    Fax. [phone]   I, Wilburn Mylar, am acting as scribe for Dr. Virgie Dad. Jarmal Lewelling.  I, Lurline Del MD, have reviewed the above documentation for accuracy and completeness, and I agree with the above.    *Total Encounter Time as defined by the Centers for Medicare and Medicaid Services includes, in addition to the face-to-face time of a patient visit (documented in the note above) non-face-to-face time: obtaining and reviewing outside history, ordering and reviewing medications, tests or procedures, care coordination (communications with other health care professionals or caregivers) and documentation in the medical record.

## 2021-03-12 ENCOUNTER — Inpatient Hospital Stay (HOSPITAL_BASED_OUTPATIENT_CLINIC_OR_DEPARTMENT_OTHER): Payer: 59 | Admitting: Oncology

## 2021-03-12 ENCOUNTER — Encounter: Payer: Self-pay | Admitting: Internal Medicine

## 2021-03-12 ENCOUNTER — Encounter: Payer: Self-pay | Admitting: Oncology

## 2021-03-12 ENCOUNTER — Inpatient Hospital Stay: Payer: 59 | Attending: Adult Health

## 2021-03-12 ENCOUNTER — Other Ambulatory Visit (HOSPITAL_BASED_OUTPATIENT_CLINIC_OR_DEPARTMENT_OTHER): Payer: Self-pay

## 2021-03-12 ENCOUNTER — Ambulatory Visit: Payer: 59 | Admitting: Internal Medicine

## 2021-03-12 ENCOUNTER — Other Ambulatory Visit: Payer: Self-pay

## 2021-03-12 VITALS — BP 141/84 | HR 97 | Temp 97.7°F | Resp 18 | Ht 63.0 in | Wt 153.4 lb

## 2021-03-12 VITALS — BP 141/84 | HR 94 | Temp 96.7°F | Wt 153.0 lb

## 2021-03-12 DIAGNOSIS — C50412 Malignant neoplasm of upper-outer quadrant of left female breast: Secondary | ICD-10-CM

## 2021-03-12 DIAGNOSIS — Z170 Estrogen receptor positive status [ER+]: Secondary | ICD-10-CM | POA: Diagnosis not present

## 2021-03-12 DIAGNOSIS — R7989 Other specified abnormal findings of blood chemistry: Secondary | ICD-10-CM | POA: Diagnosis not present

## 2021-03-12 DIAGNOSIS — M810 Age-related osteoporosis without current pathological fracture: Secondary | ICD-10-CM | POA: Diagnosis not present

## 2021-03-12 DIAGNOSIS — N61 Mastitis without abscess: Secondary | ICD-10-CM | POA: Diagnosis not present

## 2021-03-12 DIAGNOSIS — D0512 Intraductal carcinoma in situ of left breast: Secondary | ICD-10-CM | POA: Diagnosis not present

## 2021-03-12 DIAGNOSIS — L589 Radiodermatitis, unspecified: Secondary | ICD-10-CM

## 2021-03-12 DIAGNOSIS — F411 Generalized anxiety disorder: Secondary | ICD-10-CM | POA: Diagnosis not present

## 2021-03-12 LAB — CBC WITH DIFFERENTIAL (CANCER CENTER ONLY)
Abs Immature Granulocytes: 0.07 10*3/uL (ref 0.00–0.07)
Basophils Absolute: 0 10*3/uL (ref 0.0–0.1)
Basophils Relative: 0 %
Eosinophils Absolute: 0.2 10*3/uL (ref 0.0–0.5)
Eosinophils Relative: 2 %
HCT: 38.8 % (ref 36.0–46.0)
Hemoglobin: 12.8 g/dL (ref 12.0–15.0)
Immature Granulocytes: 1 %
Lymphocytes Relative: 16 %
Lymphs Abs: 1.5 10*3/uL (ref 0.7–4.0)
MCH: 30.5 pg (ref 26.0–34.0)
MCHC: 33 g/dL (ref 30.0–36.0)
MCV: 92.4 fL (ref 80.0–100.0)
Monocytes Absolute: 0.4 10*3/uL (ref 0.1–1.0)
Monocytes Relative: 4 %
Neutro Abs: 7 10*3/uL (ref 1.7–7.7)
Neutrophils Relative %: 77 %
Platelet Count: 439 10*3/uL — ABNORMAL HIGH (ref 150–400)
RBC: 4.2 MIL/uL (ref 3.87–5.11)
RDW: 11.9 % (ref 11.5–15.5)
WBC Count: 9.2 10*3/uL (ref 4.0–10.5)
nRBC: 0 % (ref 0.0–0.2)

## 2021-03-12 LAB — CMP (CANCER CENTER ONLY)
ALT: 83 U/L — ABNORMAL HIGH (ref 0–44)
AST: 40 U/L (ref 15–41)
Albumin: 3.6 g/dL (ref 3.5–5.0)
Alkaline Phosphatase: 99 U/L (ref 38–126)
Anion gap: 12 (ref 5–15)
BUN: 31 mg/dL — ABNORMAL HIGH (ref 6–20)
CO2: 26 mmol/L (ref 22–32)
Calcium: 10.1 mg/dL (ref 8.9–10.3)
Chloride: 101 mmol/L (ref 98–111)
Creatinine: 1.25 mg/dL — ABNORMAL HIGH (ref 0.44–1.00)
GFR, Estimated: 51 mL/min — ABNORMAL LOW (ref >60)
Glucose, Bld: 147 mg/dL — ABNORMAL HIGH (ref 70–99)
Potassium: 3.9 mmol/L (ref 3.5–5.1)
Sodium: 139 mmol/L (ref 135–145)
Total Bilirubin: <0.2 mg/dL — ABNORMAL LOW (ref 0.3–1.2)
Total Protein: 7.7 g/dL (ref 6.5–8.1)

## 2021-03-12 MED ORDER — ESTRING 2 MG VA RING
2.0000 mg | VAGINAL_RING | VAGINAL | 12 refills | Status: DC
Start: 2021-03-12 — End: 2022-11-05
  Filled 2021-03-12 – 2021-04-01 (×2): qty 1, 90d supply, fill #0
  Filled 2022-02-10: qty 1, fill #0

## 2021-03-12 NOTE — Progress Notes (Signed)
[facility]  CHIEF COMPLAINT:    Follow up for cellulitis  SUBJECTIVE:    Stephanie [name] is a 55 y.o. female with PMHx as below who presents to the clinic for cellulitis.   Please see my office note from 02/25/21 for further details of HPI.  Presents today for 2 week follow up.  She has been having a tough time with dexamethasone taper.  She saw Dr Isidore Moos via telehealth on Monday.  She overall did well with vancomycin and no issues with PICC line aside from trouble flushing over the weekend.  Her labs showed supra-therapeutic levels of vancomycin on Friday 03/07/21 so she has likely had therapeutic levels lasting through at least early this week.  Thus, she received a good 2 weeks of vancomycin at this point.  Her recent labs did show some elevated creatinine and LFTs which was probably related to medication side effects.  Her labs from 11/10 showed WBC improving on steroid taper (12.8), vanc trough 35.2, and creatinine improved to 1.07.  She had labs drawn this morning at the cancer center showed creatinine 1.2, AST 40, ALT 83, WBC 9.2.    Her right breast cellulitis has improved and redness gone.  She reports it looks best it has since the beginning of September.  No fevers and energy getting a little better every day although still feels very fatigued.  She also reports food has little taste or tastes like there is a bunch of salt on it.    Please see A&P for the details of today's visit and status of the patient's medical problems.   Patient's Medications  New Prescriptions   No medications on file  Previous Medications   ALENDRONATE (FOSAMAX) 70 MG TABLET    TAKE 1 TABLET BY MOUTH ONCE A WEEK. TAKE WITH A FULL GLASS OF WATER ON AN EMPTY STOMACH   BIOTIN W/ VITAMINS C & E (HAIR/SKIN/NAILS PO)    Take 1 tablet by mouth daily.    CALCIUM CARB-CHOLECALCIFEROL (CALCIUM + D3 PO)    Take 1 tablet by mouth daily.    CALCIUM CARBONATE (OS-CAL) 1250 (500  CA) MG CHEWABLE TABLET    Chew 1 tablet by mouth daily.   ESTRADIOL (ESTRING) 2 MG VAGINAL RING    Place 2 mg vaginally every 3 (three) months. follow package directions   GABAPENTIN (NEURONTIN) 300 MG CAPSULE    TAKE 1 CAPSULE BY MOUTH AT BEDTIME   SERTRALINE (ZOLOFT) 50 MG TABLET    Take 1 tablet (50 mg total) by mouth daily.   TAMOXIFEN (NOLVADEX) 20 MG TABLET    TAKE 1 TABLET BY MOUTH ONCE DAILY   TURMERIC PO    Take by mouth.  Modified Medications   No medications on file  Discontinued Medications   No medications on file      Past Medical History:  Diagnosis Date   Anxiety    Breast mass, left    Cancer (HCC)    Complication of anesthesia    felt lethargic x 4 days following last surgery in 2017, but 2018 did okay   Depression    Headache    migraines   History of radiation therapy 03/13/19- 04/11/19   Left Breast 16 fraction of 2. 66 Gy each to total 42.56 Gy. Left breast boost 4 fractions of 2 Gy to total 8 Gy.    Osteopenia     Social History   Tobacco Use  Smoking status: Never   Smokeless tobacco: Never  Vaping Use   Vaping Use: Never used  Substance Use Topics   Alcohol use: No    Comment: rare use   Drug use: No    Family History  Problem Relation Age of Onset   Depression Brother    Atrial fibrillation Brother    Hypertension Mother    Osteoporosis Mother    ADD / ADHD Son    ADD / ADHD Daughter    Diabetes Maternal Grandmother    Diabetes Maternal Grandfather    Diabetes Paternal Grandmother    Diabetes Paternal Grandfather    Parkinson's disease Maternal Aunt     Allergies  Allergen Reactions   Wellbutrin Xl [Bupropion] Hives    Review of Systems  All other systems reviewed and are negative. Except as noted above.    OBJECTIVE:    Vitals:   03/12/21 1105  BP: (!) 141/84  Pulse: 94  Temp: (!) 96.7 F (35.9 C)  TempSrc: Temporal  Weight: 153 lb (69.4 kg)   Body mass index is 27.1 kg/m.  Physical Exam Constitutional:       General: She is not in acute distress.    Appearance: Normal appearance.     Comments: Appears tired today.   HENT:     Head: Normocephalic and atraumatic.  Eyes:     Extraocular Movements: Extraocular movements intact.     Conjunctiva/sclera: Conjunctivae normal.  Pulmonary:     Effort: Pulmonary effort is normal. No respiratory distress.  Skin:    General: Skin is warm and dry.     Findings: No erythema.     Comments: Left breast erythema has receded.  There is no firmness, induration, or signs of ongoing cellulitis at this time.   Neurological:     General: No focal deficit present.     Mental Status: She is alert and oriented to person, place, and time.  Psychiatric:        Mood and Affect: Mood normal.        Behavior: Behavior normal.     Labs and Microbiology: CBC Latest Ref Rng & Units 03/12/2021 02/24/2021 02/13/2021  WBC 4.0 - 10.5 K/uL 9.2 9.9 7.3  Hemoglobin 12.0 - 15.0 g/dL 12.8 11.5(L) 12.2  Hematocrit 36.0 - 46.0 % 38.8 32.7(L) 36.9  Platelets 150 - 400 K/uL 439(H) 318 397   CMP Latest Ref Rng & Units 03/12/2021 02/24/2021 02/13/2021  Glucose 70 - 99 mg/dL 147(H) 93 55(L)  BUN 6 - 20 mg/dL 31(H) 17 16  Creatinine 0.44 - 1.00 mg/dL 1.25(H) 0.70 0.77  Sodium 135 - 145 mmol/L 139 138 144  Potassium 3.5 - 5.1 mmol/L 3.9 3.6 3.7  Chloride 98 - 111 mmol/L 101 102 106  CO2 22 - 32 mmol/L 26 27 28   Calcium 8.9 - 10.3 mg/dL 10.1 8.9 9.0  Total Protein 6.5 - 8.1 g/dL 7.7 6.8 -  Total Bilirubin 0.3 - 1.2 mg/dL <0.2(L) 0.4 -  Alkaline Phos 38 - 126 U/L 99 56 -  AST 15 - 41 U/L 40 43(H) -  ALT 0 - 44 U/L 83(H) 47(H) -      ASSESSMENT & PLAN:    1. Cellulitis of left breast  2. Malignant neoplasm of upper-outer quadrant of left breast in female, estrogen receptor positive ([place])  3. Radiation dermatitis  4. Elevated LFTs  5. Elevated serum creatinine  She is status post 2 weeks of IV vancomycin and seems to have had a  good response.  This did coincide with  dexamethasone taper as well for possible radiation recall so the overall picture is somewhat clouded by that.  Nonetheless there does not appear to be any residual cellulitis at this time and would recommend no further antibiotics for now.  She will follow up with ID as needed.  Have recommended repeat CMP to reassess kidney and liver function in the next 2-4 weeks to ensure normalization.   Rayn[facility] 03/12/2021, 11:30 AM

## 2021-03-18 ENCOUNTER — Encounter: Payer: Self-pay | Admitting: Oncology

## 2021-03-22 ENCOUNTER — Emergency Department (HOSPITAL_BASED_OUTPATIENT_CLINIC_OR_DEPARTMENT_OTHER): Payer: 59 | Admitting: Radiology

## 2021-03-22 ENCOUNTER — Encounter (HOSPITAL_BASED_OUTPATIENT_CLINIC_OR_DEPARTMENT_OTHER): Payer: Self-pay | Admitting: Emergency Medicine

## 2021-03-22 ENCOUNTER — Emergency Department (HOSPITAL_BASED_OUTPATIENT_CLINIC_OR_DEPARTMENT_OTHER)
Admission: EM | Admit: 2021-03-22 | Discharge: 2021-03-22 | Disposition: A | Discharge status: Home / Self Care | Payer: 59 | Attending: Emergency Medicine | Admitting: Emergency Medicine

## 2021-03-22 ENCOUNTER — Other Ambulatory Visit: Payer: Self-pay

## 2021-03-22 DIAGNOSIS — S99911A Unspecified injury of right ankle, initial encounter: Secondary | ICD-10-CM | POA: Diagnosis not present

## 2021-03-22 DIAGNOSIS — M7989 Other specified soft tissue disorders: Secondary | ICD-10-CM | POA: Diagnosis not present

## 2021-03-22 DIAGNOSIS — S93401A Sprain of unspecified ligament of right ankle, initial encounter: Secondary | ICD-10-CM | POA: Diagnosis not present

## 2021-03-22 DIAGNOSIS — Y9301 Activity, walking, marching and hiking: Secondary | ICD-10-CM | POA: Diagnosis not present

## 2021-03-22 DIAGNOSIS — Z853 Personal history of malignant neoplasm of breast: Secondary | ICD-10-CM | POA: Diagnosis not present

## 2021-03-22 DIAGNOSIS — W19XXXA Unspecified fall, initial encounter: Secondary | ICD-10-CM

## 2021-03-22 DIAGNOSIS — X501XXA Overexertion from prolonged static or awkward postures, initial encounter: Secondary | ICD-10-CM | POA: Insufficient documentation

## 2021-03-22 NOTE — ED Provider Notes (Signed)
[facility] DE[facility] Provider Note   CSN: [id number] Arrival date & time: 03/22/21  1247     History Chief Complaint  Patient presents with   Ankle Injury    Stephanie [name] is a 55 y.o. female.  HPI  55 year old female with a history of anxiety, left breast mass, cancer, complication of anesthesia, depression, headache, osteopenia, who presents to the emergency department today for evaluation of right ankle pain.  States that she was trying to catch a stray dog in her backyard last night and was walking in her yard when it was dark when she stepped into a hole and rolled her ankle.  She felt a pop at the time and had sudden onset of pain.  She has been able to bear weight but it does make pain worse.  There were no other reported injuries associated with this fall.  Past Medical History:  Diagnosis Date   Anxiety    Breast mass, left    Cancer ([place])    Complication of anesthesia    felt lethargic x 4 days following last surgery in 2017, but 2018 did okay   Depression    Headache    migraines   History of radiation therapy 03/13/19- 04/11/19   Left Breast 16 fraction of 2. 66 Gy each to total 42.56 Gy. Left breast boost 4 fractions of 2 Gy to total 8 Gy.    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   Achilles tendinosis of left lower extremity 11/30/2016   Depression, major 01/10/2013   Generalized anxiety disorder 01/10/2013   Osteoporosis of lumbar spine 04/05/2012   Attention deficit disorder without mention of  hyperactivity 04/05/2012   Tendonitis of wrist, right 02/05/2011    Past Surgical History:  Procedure Laterality Date   BREAST BIOPSY Left 07/10/2015   high risk stereo   BREAST EXCISIONAL BIOPSY Left 08/15/2015   ADH   BREAST LUMPECTOMY WITH RADIOACTIVE SEED LOCALIZATION Left 08/15/2015   Procedure:  LEFT BREAST LUMPECTOMY WITH RADIOACTIVE SEED LOCALIZATION;  Surgeon: Erroll Luna, MD;  Location: [facility];  Service: General;  Laterality: Left;   BREAST LUMPECTOMY WITH RADIOACTIVE SEED LOCALIZATION Left 02/09/2019   Procedure: LEFT BREAST LUMPECTOMY WITH RADIOACTIVE SEED LOCALIZATION;  Surgeon: [name], MD;  Location: [facility];  Service: General;  Laterality: Left;   EXCISION OF BREAST BIOPSY Left 12/30/2016   Benign ALH sclerosing lesion   RADIOACTIVE SEED GUIDED EXCISIONAL BREAST BIOPSY Left 12/30/2016   Procedure: RADIOACTIVE SEED GUIDED EXCISIONAL LEFT BREAST BIOPSY;  Surgeon: [name], MD;  Location: [facility];  Service: General;  Laterality: Left;   RADIOACTIVE SEED GUIDED EXCISIONAL BREAST BIOPSY Left 02/09/2019   Procedure: RADIOACTIVE SEED GUIDED EXCISIONAL LEFT  BREAST BIOPSY;  Surgeon: [name], MD;  Location: [facility];  Service: General;  Laterality: Left;   TONSILLECTOMY  Age 71   TRIGGER FINGER RELEASE  2010   Right Thumb     OB History     Gravida  2   Para  0   Term  0  Preterm  0   AB  0   Living  2      SAB  0   IAB  0   Ectopic  0   Multiple  0   Live Births  2           Family History  Problem Relation Age of Onset   Depression Brother    Atrial fibrillation Brother    Hypertension Mother    Osteoporosis Mother    ADD / ADHD Son    ADD / ADHD Daughter    Diabetes Maternal Grandmother    Diabetes Maternal Grandfather    Diabetes Paternal Grandmother    Diabetes Paternal Grandfather    Parkinson's disease Maternal Aunt     Social History   Tobacco Use   Smoking status:  Never   Smokeless tobacco: Never  Vaping Use   Vaping Use: Never used  Substance Use Topics   Alcohol use: No    Comment: rare use   Drug use: No    Home Medications Prior to Admission medications   Medication Sig Start Date End Date Taking? Authorizing Provider  alendronate (FOSAMAX) 70 MG tablet TAKE 1 TABLET BY MOUTH ONCE A WEEK. TAKE WITH A FULL GLASS OF WATER ON AN EMPTY STOMACH 11/25/20 11/25/21  Donnamae Jude, MD  Biotin w/ Vitamins C & E (HAIR/SKIN/NAILS PO) Take 1 tablet by mouth daily.     [provider]  Calcium Carb-Cholecalciferol (CALCIUM + D3 PO) Take 1 tablet by mouth daily.     [provider]  calcium carbonate (OS-CAL) 1250 (500 Ca) MG chewable tablet Chew 1 tablet by mouth daily.    [provider]  estradiol (ESTRING) 2 MG vaginal ring Place 2 mg vaginally every 3 (three) months. follow package directions 03/12/21   Magrinat, Virgie Dad, MD  gabapentin (NEURONTIN) 300 MG capsule TAKE 1 CAPSULE BY MOUTH AT BEDTIME 07/03/20 07/03/21  Magrinat, Virgie Dad, MD  sertraline (ZOLOFT) 50 MG tablet Take 1 tablet (50 mg total) by mouth daily. 03/12/21   Magrinat, Virgie Dad, MD  tamoxifen (NOLVADEX) 20 MG tablet TAKE 1 TABLET BY MOUTH ONCE DAILY 04/30/20 05/15/21  Magrinat, Virgie Dad, MD  TURMERIC PO Take by mouth.    [provider]    Allergies    Wellbutrin xl [bupropion]  Review of Systems   Review of Systems  Constitutional:  Negative for fever.  Musculoskeletal:        Ankle pain  Skin:  Negative for wound.   Physical Exam Updated Vital Signs BP 133/86 (BP Location: Right Arm)   Pulse 96   Resp 18   Ht 5\' 3"  (1.6 m)   Wt 69.4 kg   LMP 01/25/2014 (Approximate)   SpO2 99%   BMI 27.10 kg/m   Physical Exam Vitals and nursing note reviewed.  Constitutional:      General: She is not in acute distress.    Appearance: She is well-developed.  HENT:     Head: Normocephalic and atraumatic.  Eyes:     Conjunctiva/sclera: Conjunctivae  normal.  Cardiovascular:     Rate and Rhythm: Normal rate.  Pulmonary:     Effort: Pulmonary effort is normal.  Musculoskeletal:        General: Normal range of motion.     Cervical back: Neck supple.     Comments: TTP to the lateral malleolus with associated swelling no medial malleolar TTP. Dorsiflexion and plantarflexion are intact.   Skin:  General: Skin is warm and dry.  Neurological:     Mental Status: She is alert.    ED Results / Procedures / Treatments   Labs (all labs ordered are listed, but only abnormal results are displayed) Labs Reviewed - No data to display  EKG None  Radiology DG Ankle Complete Right  Result Date: 03/22/2021 CLINICAL DATA:  Right ankle injury last night. Complaining pain and swelling. EXAM: RIGHT ANKLE - COMPLETE 3+ VIEW COMPARISON:  None. FINDINGS: No fracture or bone lesion. Ankle joint normally spaced and aligned.  No arthropathic changes. Soft tissue swelling which predominates laterally. IMPRESSION: No fracture or dislocation. Electronically Signed   By: Lajean Manes M.D.   On: 03/22/2021 13:36    Procedures Procedures   SPLINT APPLICATION Date/Time: 5:40 PM Authorized by: Rodney Booze Consent: Verbal consent obtained. Risks and benefits: risks, benefits and alternatives were discussed Consent given by: patient Splint applied by: technician Location details: rle Splint type: aso ankle  Supplies used: aso ankle  Post-procedure: The splinted body part was neurovascularly unchanged following the procedure. Patient tolerance: Patient tolerated the procedure well with no immediate complications.    Medications Ordered in ED Medications - No data to display  ED Course  I have reviewed the triage vital signs and the nursing notes.  Pertinent labs & imaging results that were available during my care of the patient were reviewed by me and considered in my medical decision making (see chart for details).    MDM  Rules/Calculators/A&P                          Patient presenting with ankle pain after twisting ankle prior last night.  Vital signs stable and patient nontoxic-appearing.  X-ray of right ankle negative for acute fracture abnormality.  A splint was applied. Pt declined cruthces.  OrthO follow-up given and patient advised to follow-up with either PCP or orthopedics in 1 week for reevaluation.  Advised Tylenol, ibuprofen, and rice protocol for pain.  Advised to return to the ER for any new or worsening symptoms in the meantime.  All questions were answered and patient understands plan and reasons to return.   Final Clinical Impression(s) / ED Diagnoses Final diagnoses:  Fall, initial encounter  Sprain of right ankle, unspecified ligament, initial encounter    Rx / DC Orders ED Discharge Orders     None        Bishop Dublin 03/22/21 1348    Charlesetta Shanks, MD 03/28/21 [phone]

## 2021-03-22 NOTE — Discharge Instructions (Addendum)
You may alternate taking Tylenol and Naproxen as needed for pain control. You may take Naproxen twice daily as directed on your discharge paperwork and you may take  [id number] mg of Tylenol every 6 hours. Do not exceed 4000 mg of Tylenol daily as this can lead to liver damage. Also, make sure to take Naproxen with meals as it can cause an upset stomach. Do not take other NSAIDs while taking Naproxen such as (Aleve, Ibuprofen, Aspirin, Celebrex, etc) and do not take more than the prescribed dose as this can lead to ulcers and bleeding in your GI tract. You may use warm and cold compresses to help with your symptoms.   Please follow up with your primary doctor or with orthopedics within the next 7-10 days for re-evaluation and further treatment of your symptoms.   Please return to the ER sooner if you have any new or worsening symptoms.

## 2021-03-22 NOTE — ED Triage Notes (Signed)
Pt arrives pov with c/o right ankle injury last night. Pt arrives to triage with ace wrap, reports swelling and tenderness noted, pain increases when bearing weight

## 2021-03-25 ENCOUNTER — Other Ambulatory Visit (HOSPITAL_BASED_OUTPATIENT_CLINIC_OR_DEPARTMENT_OTHER): Payer: Self-pay

## 2021-03-31 ENCOUNTER — Encounter: Payer: Self-pay | Admitting: Oncology

## 2021-03-31 NOTE — Progress Notes (Signed)
[facility] [address] [place] Phone: [phone] Subjective:   Stephanie [name], am serving as a scribe for Dr. Hulan Saas. This visit occurred during the SARS-CoV-2 public health emergency.  Safety protocols were in place, including screening questions prior to the visit, additional usage of staff PPE, and extensive cleaning of exam room while observing appropriate contact time as indicated for disinfecting solutions.   I'm seeing this patient by the request  of:  [name], MD  CC: Ankle pain  [id number]  Stephanie [name] is a 55 y.o. female coming in with complaint of ankle sprain. Sprained right ankle 10 days ago. Seen at [facility]. Still having some pain. Sharp pain with movement. Otherwise very little pain. Swelling has gone down a lot. Has iced it, started tumeric this morning. Pain lateral side under malleolus.       Past Medical History:  Diagnosis Date   Anxiety    Breast mass, left    Cancer ([facility])    Complication of anesthesia    felt lethargic x 4 days following last surgery in 2017, but 2018 did okay   Depression    Headache    migraines   History of radiation therapy 03/13/19- 04/11/19   Left Breast 16 fraction of 2. 66 Gy each to total 42.56 Gy. Left breast boost 4 fractions of 2 Gy to total 8 Gy.    Osteopenia    Past Surgical History:  Procedure Laterality Date   BREAST BIOPSY Left 07/10/2015   high risk stereo   BREAST EXCISIONAL BIOPSY Left 08/15/2015   ADH   BREAST LUMPECTOMY WITH RADIOACTIVE SEED LOCALIZATION Left 08/15/2015   Procedure:  LEFT BREAST LUMPECTOMY WITH RADIOACTIVE SEED LOCALIZATION;  Surgeon: Erroll Luna, MD;  Location: [facility];  Service: General;  Laterality: Left;   BREAST LUMPECTOMY WITH RADIOACTIVE SEED LOCALIZATION Left 02/09/2019   Procedure: LEFT BREAST LUMPECTOMY WITH RADIOACTIVE SEED LOCALIZATION;  Surgeon: [name], MD;  Location: [facility];   Service: General;  Laterality: Left;   EXCISION OF BREAST BIOPSY Left 12/30/2016   Benign ALH sclerosing lesion   RADIOACTIVE SEED GUIDED EXCISIONAL BREAST BIOPSY Left 12/30/2016   Procedure: RADIOACTIVE SEED GUIDED EXCISIONAL LEFT BREAST BIOPSY;  Surgeon: [name], MD;  Location: [facility];  Service: General;  Laterality: Left;   RADIOACTIVE SEED GUIDED EXCISIONAL BREAST BIOPSY Left 02/09/2019   Procedure: RADIOACTIVE SEED GUIDED EXCISIONAL LEFT  BREAST BIOPSY;  Surgeon: [name], MD;  Location: [facility];  Service: General;  Laterality: Left;   TONSILLECTOMY  Age 89   TRIGGER FINGER RELEASE  2010   Right Thumb   Social History   Socioeconomic History   Marital status: Married    Spouse name: Not on file   Number of children: Not on file   Years of education: Not on file   Highest education level: Not on file  Occupational History   Occupation: RN    Employer: [employer]  Tobacco Use   Smoking status: Never   Smokeless tobacco: Never  Vaping Use   Vaping Use: Never used  Substance and Sexual Activity   Alcohol use: No    Comment: rare use   Drug use: No   Sexual activity: Yes    Partners: Male    Birth control/protection: Post-menopausal  Other Topics Concern   Not on file  Social History Narrative   04/05/12 AM he was born and grew up in [place]. She has 3 older brothers. She never suffered any abuse. Her father died when she was 51 years of age. She completed her bachelor of science in nursing in 1989, and has been working as an Therapist, sports . She has been married for 21 years, and she and her husband have a 60 year old son and a 19 year old daughter. She affiliates as Psychologist, forensic. She denies any legal involvement. She enjoys hiking and reading. She reports her social support consists of friends from college, and a friend from church. 04/05/2012   Social Determinants of Health   Financial Resource Strain: Not on file  Food Insecurity:  Not on file  Transportation Needs: Not on file  Physical Activity: Not on file  Stress: Not on file  Social Connections: Not on file   Allergies  Allergen Reactions   Wellbutrin Xl [Bupropion] Hives   Family History  Problem Relation Age of Onset   Depression Brother    Atrial fibrillation Brother    Hypertension Mother    Osteoporosis Mother    ADD / ADHD Son    ADD / ADHD Daughter    Diabetes Maternal Grandmother    Diabetes Maternal Grandfather    Diabetes Paternal Grandmother    Diabetes Paternal Grandfather    Parkinson's disease Maternal Aunt     Current Outpatient Medications (Endocrine & Metabolic):    alendronate (FOSAMAX) 70 MG tablet, TAKE 1 TABLET BY MOUTH ONCE A WEEK. TAKE WITH A FULL GLASS OF WATER ON AN EMPTY STOMACH           Current Outpatient Medications (Other):    Biotin w/ Vitamins C & E (HAIR/SKIN/NAILS PO), Take 1 tablet by mouth daily.    Calcium Carb-Cholecalciferol (CALCIUM + D3 PO), Take 1 tablet by mouth daily.    calcium carbonate (OS-CAL) 1250 (500 Ca) MG chewable tablet, Chew 1 tablet by mouth daily.   estradiol (ESTRING) 2 MG vaginal ring, Place 2 mg vaginally every 3 (three) months. follow package directions   gabapentin (NEURONTIN) 300 MG capsule, TAKE 1 CAPSULE BY MOUTH AT BEDTIME   sertraline (ZOLOFT) 50 MG tablet, Take 1 tablet (50 mg total) by mouth daily.   tamoxifen (NOLVADEX) 20 MG tablet, TAKE 1 TABLET BY MOUTH ONCE DAILY   TURMERIC PO, Take by mouth.   Facility-Administered Medications Ordered in Other Visits (Other):    0.9 %  sodium chloride infusion No current facility-administered medications for this visit.   Reviewed prior external information including notes and imaging from  primary care provider As well as notes that were available from care everywhere and other healthcare systems.  Past medical history, social, surgical and family history all reviewed in electronic medical record.  No pertanent information  unless stated regarding to the chief complaint.   Review of Systems:  No headache, visual changes, nausea, vomiting, diarrhea, constipation, dizziness, abdominal pain, skin rash, fevers, chills, night sweats, weight loss, swollen lymph nodes, body aches, joint swelling, chest pain, shortness of breath, mood changes. POSITIVE muscle aches  Objective  Blood pressure 126/86, pulse 93, height 5\' 3"  (1.6 m), last menstrual period 01/25/2014, SpO2 95 %.   General: No apparent distress alert and oriented x3 mood and affect normal, dressed appropriately.  HEENT: Pupils equal, extraocular movements intact  Respiratory: Patient's speak in full sentences and does not appear short of breath  Cardiovascular: No lower extremity edema, non tender, no erythema  Gait normal with good balance and coordination.  MSK: Ankle exam shows  Limited muscular skeletal ultrasound  was performed and interpreted by Hulan Saas, M  Limited ultrasound of patient's ankle shows patient does have hypoechoic changes around the distal portion of the lateral malleolus.  Patient does have calcific changes noted that is consistent with a potential avulsion.  Increasing neovascularization in Doppler flow in the area as well as hypoechoic changes minorly of the lateral ankle. Impression: Avulsion fracture of the lateral malleolus with small effusion.  [id number]; 15 additional minutes spent for Therapeutic exercises as stated in above notes.  This included exercises focusing on stretching, strengthening, with significant focus on eccentric aspects.   Long term goals include an improvement in range of motion, strength, endurance as well as avoiding reinjury. Patient's frequency would include in 1-2 times a day, 3-5 times a week for a duration of 6-12 weeks. Ankle strengthening that included:  Basic range of motion exercises to allow proper full motion at ankle Stretching of the lower leg and hamstrings  Theraband exercises for the lower  leg - inversion, eversion, dorsiflexion and plantarflexion each to be completed with a theraband Balance exercises to increase proprioception Weight bearing exercises to increase strength and balance  Proper technique shown and discussed handout in great detail with ATC.  All questions were discussed and answered.      Impression and Recommendations:     The above documentation has been reviewed and is accurate and complete Lyndal Pulley, DO

## 2021-04-01 ENCOUNTER — Ambulatory Visit: Payer: Self-pay

## 2021-04-01 ENCOUNTER — Other Ambulatory Visit: Payer: Self-pay

## 2021-04-01 ENCOUNTER — Other Ambulatory Visit (HOSPITAL_BASED_OUTPATIENT_CLINIC_OR_DEPARTMENT_OTHER): Payer: Self-pay

## 2021-04-01 ENCOUNTER — Encounter: Payer: Self-pay | Admitting: Oncology

## 2021-04-01 ENCOUNTER — Ambulatory Visit: Payer: 59 | Admitting: Family Medicine

## 2021-04-01 VITALS — BP 126/86 | HR 93 | Ht 63.0 in

## 2021-04-01 DIAGNOSIS — M25571 Pain in right ankle and joints of right foot: Secondary | ICD-10-CM

## 2021-04-01 DIAGNOSIS — S8264XA Nondisplaced fracture of lateral malleolus of right fibula, initial encounter for closed fracture: Secondary | ICD-10-CM | POA: Diagnosis not present

## 2021-04-01 NOTE — Patient Instructions (Signed)
4-5000iu vit d daily Voltaren gel Do prescribed exercises at least 3x a week See you again in 4 weeks

## 2021-04-02 ENCOUNTER — Encounter: Payer: Self-pay | Admitting: Family Medicine

## 2021-04-02 DIAGNOSIS — S8261XA Displaced fracture of lateral malleolus of right fibula, initial encounter for closed fracture: Secondary | ICD-10-CM | POA: Insufficient documentation

## 2021-04-02 NOTE — Assessment & Plan Note (Signed)
Nondisplaced fracture with small avulsion noted.  Patient that this is contributing to more of her pain that seems to be out of the ordinary.  Discussed Aircast compared to the lace up brace.  Discussed proper shoes, vitamin D supplementation icing regimen.  Increase activity slowly.  Follow-up again in 4 to 8 weeks

## 2021-04-03 ENCOUNTER — Encounter: Payer: Self-pay | Admitting: Oncology

## 2021-04-06 ENCOUNTER — Encounter: Payer: Self-pay | Admitting: Oncology

## 2021-04-06 ENCOUNTER — Encounter: Payer: Self-pay | Admitting: Family Medicine

## 2021-04-07 ENCOUNTER — Telehealth: Payer: Self-pay | Admitting: *Deleted

## 2021-04-07 ENCOUNTER — Other Ambulatory Visit: Payer: Self-pay | Admitting: *Deleted

## 2021-04-07 ENCOUNTER — Ambulatory Visit (HOSPITAL_COMMUNITY): Admission: RE | Admit: 2021-04-07 | Payer: 59 | Source: Ambulatory Visit

## 2021-04-07 DIAGNOSIS — Z170 Estrogen receptor positive status [ER+]: Secondary | ICD-10-CM

## 2021-04-07 DIAGNOSIS — M7989 Other specified soft tissue disorders: Secondary | ICD-10-CM

## 2021-04-07 DIAGNOSIS — C50412 Malignant neoplasm of upper-outer quadrant of left female breast: Secondary | ICD-10-CM

## 2021-04-07 NOTE — Telephone Encounter (Signed)
The [facility] faxed request to obtain additional information.  This nurse faxed completed, signed attending Physician Statement.  Forwarding request to records release bin for records per [facility].IM. if needed for claim number [id number].   Originals mailed to [place] [id number] for patient to maintain for her records.

## 2021-04-14 ENCOUNTER — Other Ambulatory Visit: Payer: Self-pay

## 2021-04-14 ENCOUNTER — Ambulatory Visit (HOSPITAL_COMMUNITY)
Admission: RE | Admit: 2021-04-14 | Discharge: 2021-04-14 | Disposition: A | Discharge status: Home / Self Care | Payer: 59 | Source: Ambulatory Visit | Attending: Family Medicine | Admitting: Family Medicine

## 2021-04-14 DIAGNOSIS — M79661 Pain in right lower leg: Secondary | ICD-10-CM

## 2021-04-17 ENCOUNTER — Other Ambulatory Visit (HOSPITAL_BASED_OUTPATIENT_CLINIC_OR_DEPARTMENT_OTHER): Payer: Self-pay

## 2021-04-29 NOTE — Progress Notes (Signed)
[name] Phone: [phone] Subjective:   Stephanie [name], am serving as a scribe for Dr. Hulan [name]. This visit occurred during the SARS-CoV-2 public health emergency.  Safety protocols were in place, including screening questions prior to the visit, additional usage of staff PPE, and extensive cleaning of exam room while observing appropriate contact time as indicated for disinfecting solutions.  I'm seeing this patient by the request  of:  Stephanie Noon, MD  CC: Right ankle pain follow-up  [name]  04/01/2021 Nondisplaced fracture with small avulsion noted.  Patient that this is contributing to more of her pain that seems to be out of the ordinary.  Discussed Aircast compared to the lace up brace.  Discussed proper shoes, vitamin D supplementation icing regimen.  Increase activity slowly.  Follow-up again in 4 to 8 weeks  Updated 04/30/2021 BALEIGH [name] is a 56 y.o. female coming in with complaint of right ankle pain.  Was found to have more of a nondisplaced avulsion fracture of the lateral malleolus.  Patient was to do vitamin D supplementation, Aircast given, icing regimen.  Patient states that today is the first day she did not wear AirCast to work. She is [name] longer having pain except for going down stairs. Pain in anterior aspect of ankle. Unable to also stretch soleus without pain.       Past Medical History:  Diagnosis Date   Anxiety    Breast mass, left    Cancer ([place])    Complication of anesthesia    felt lethargic x 4 days following last surgery in 2017, but 2018 did okay   Depression    Headache    migraines   History of radiation therapy 03/13/19- 04/11/19   Left Breast 16 fraction of 2. 66 Gy each to total 42.56 Gy. Left breast boost 4 fractions of 2 Gy to total 8 Gy.    Osteopenia    Past Surgical History:  Procedure Laterality Date   BREAST BIOPSY Left 07/10/2015   high risk  stereo   BREAST EXCISIONAL BIOPSY Left 08/15/2015   ADH   BREAST LUMPECTOMY WITH RADIOACTIVE SEED LOCALIZATION Left 08/15/2015   Procedure:  LEFT BREAST LUMPECTOMY WITH RADIOACTIVE SEED LOCALIZATION;  Surgeon: Stephanie Luna, MD;  [facility]: [name];  Service: General;  Laterality: Left;   BREAST LUMPECTOMY WITH RADIOACTIVE SEED LOCALIZATION Left 02/09/2019   Procedure: LEFT BREAST LUMPECTOMY WITH RADIOACTIVE SEED LOCALIZATION;  Surgeon: Stephanie Bookbinder, MD;  [facility]: [name];  Service: General;  Laterality: Left;   EXCISION OF BREAST BIOPSY Left 12/30/2016   Benign ALH sclerosing lesion   RADIOACTIVE SEED GUIDED EXCISIONAL BREAST BIOPSY Left 12/30/2016   Procedure: RADIOACTIVE SEED GUIDED EXCISIONAL LEFT BREAST BIOPSY;  Surgeon: Stephanie Bookbinder, MD;  [facility]: [name];  Service: General;  Laterality: Left;   RADIOACTIVE SEED GUIDED EXCISIONAL BREAST BIOPSY Left 02/09/2019   Procedure: RADIOACTIVE SEED GUIDED EXCISIONAL LEFT  BREAST BIOPSY;  Surgeon: Stephanie Bookbinder, MD;  [facility]: [name];  Service: General;  Laterality: Left;   TONSILLECTOMY  Age 41   TRIGGER FINGER RELEASE  2010   Right Thumb   Social History   Socioeconomic History   Marital status: Married    Spouse name: Not on file   Number of children: Not on file   Years of education: Not on file   Highest education level: Not on file  Occupational History   Occupation: Therapist, sports  Employer: [name]  Tobacco Use   Smoking status: Never   Smokeless tobacco: Never  Vaping Use   Vaping Use: Never used  Substance and Sexual Activity   Alcohol use: [name]    Comment: rare use   Drug use: [name]   Sexual activity: Yes    Partners: Male    Birth control/protection: Post-menopausal  Other Topics Concern   Not on file  Social History Narrative   04/05/12 AM he was born and grew up in [facility]. She has 3 older brothers. She never suffered any abuse. Her father died when she was  76 years of age. She completed her bachelor of science in nursing in 1989, and has been working as an Therapist, sports . She has been married for 21 years, and she and her husband have a 63 year old son and a 35 year old daughter. She affiliates as Psychologist, forensic. She denies any legal involvement. She enjoys hiking and reading. She reports her social support consists of friends from college, and a friend from church. 04/05/2012   Social Determinants of Health   Financial Resource Strain: Not on file  Food Insecurity: Not on file  Transportation Needs: Not on file  Physical Activity: Not on file  Stress: Not on file  Social Connections: Not on file   Allergies  Allergen Reactions   Wellbutrin Xl [Bupropion] Hives   Family History  Problem Relation Age of Onset   Depression Brother    Atrial fibrillation Brother    Hypertension Mother    Osteoporosis Mother    ADD / ADHD Son    ADD / ADHD Daughter    Diabetes Maternal Grandmother    Diabetes Maternal Grandfather    Diabetes Paternal Grandmother    Diabetes Paternal Grandfather    Parkinson's disease Maternal Aunt     Current Outpatient Medications (Endocrine & Metabolic):    alendronate (FOSAMAX) 70 MG tablet, TAKE 1 TABLET BY MOUTH ONCE A WEEK. TAKE WITH A FULL GLASS OF WATER ON AN EMPTY STOMACH           Current Outpatient Medications (Other):    Biotin w/ Vitamins C & E (HAIR/SKIN/NAILS PO), Take 1 tablet by mouth daily.    Calcium Carb-Cholecalciferol (CALCIUM + D3 PO), Take 1 tablet by mouth daily.    calcium carbonate (OS-CAL) 1250 (500 Ca) MG chewable tablet, Chew 1 tablet by mouth daily.   estradiol (ESTRING) 2 MG vaginal ring, Place 2 mg vaginally every 3 (three) months. follow package directions   gabapentin (NEURONTIN) 300 MG capsule, TAKE 1 CAPSULE BY MOUTH AT BEDTIME   sertraline (ZOLOFT) 50 MG tablet, Take 1 tablet (50 mg total) by mouth daily.   tamoxifen (NOLVADEX) 20 MG tablet, TAKE 1 TABLET BY MOUTH ONCE DAILY   TURMERIC  PO, Take by mouth.   Facility-Administered Medications Ordered in Other Visits (Other):    0.9 %  sodium chloride infusion [name] current facility-administered medications for this visit.   Reviewed prior external information including notes and imaging from  primary care provider As well as notes that were available from care everywhere and other healthcare systems.  Past medical history, social, surgical and family history all reviewed in electronic medical record.  [name] pertanent information unless stated regarding to the chief complaint.   Review of Systems:  [name] headache, visual changes, nausea, vomiting, diarrhea, constipation, dizziness, abdominal pain, skin rash, fevers, chills, night sweats, weight loss, swollen lymph nodes, body aches, joint swelling, chest pain, shortness of breath, mood changes. POSITIVE  muscle aches  Objective  Blood pressure 126/82, pulse 84, height 5\' 3"  (1.6 m), weight 160 lb (72.6 kg), last menstrual period 01/25/2014, SpO2 95 %.   General: [name] apparent distress alert and oriented x3 mood and affect normal, dressed appropriately.  HEENT: Pupils equal, extraocular movements intact  Respiratory: Patient's speak in full sentences and does not appear short of breath  Cardiovascular: [name] lower extremity edema, non tender, [name] erythema  Gait normal with good balance and coordination.  MSK: Patient does on the right ankle slight swelling over the lateral aspect.  Still tender to palpation over the malleolus.  Patient has good range of motion noted of the ankle mortise.  [name] significant tenderness but does have what appears to be a small effusion of the ankle.   Limited muscular skeletal ultrasound was performed and interpreted by Stephanie [name], M  Limited ultrasound of patient's right ankle does show that patient does have a callus formation that is also hard callus over the lateral malleolus area.  Patient still has significant amount of soft tissue swelling still  noted.  Patient also has a moderate effusion of the ankle mortise.  [name] cortical though irregularity noted of the talar dome. Impression: Interval improvement of the lateral malleolus fracture with a small effusion still noted   Impression and Recommendations:    The above documentation has been reviewed and is accurate and complete Lyndal Pulley, DO

## 2021-04-30 ENCOUNTER — Encounter: Payer: Self-pay | Admitting: Family Medicine

## 2021-04-30 ENCOUNTER — Ambulatory Visit: Payer: 59 | Admitting: Family Medicine

## 2021-04-30 ENCOUNTER — Other Ambulatory Visit: Payer: Self-pay

## 2021-04-30 ENCOUNTER — Ambulatory Visit (INDEPENDENT_AMBULATORY_CARE_PROVIDER_SITE_OTHER): Payer: 59

## 2021-04-30 ENCOUNTER — Ambulatory Visit: Payer: Self-pay

## 2021-04-30 VITALS — BP 126/82 | HR 84 | Ht 63.0 in | Wt 160.0 lb

## 2021-04-30 DIAGNOSIS — M25571 Pain in right ankle and joints of right foot: Secondary | ICD-10-CM | POA: Diagnosis not present

## 2021-04-30 DIAGNOSIS — S8264XA Nondisplaced fracture of lateral malleolus of right fibula, initial encounter for closed fracture: Secondary | ICD-10-CM

## 2021-04-30 NOTE — Patient Instructions (Signed)
Xray R ankle Compression socks Ice but do not burn the ankle 20 min  Continue ROM exercise and Vit D Wear brace for 1 more week See me again in 3-4 weeks

## 2021-04-30 NOTE — Assessment & Plan Note (Signed)
Improvement noted.  Does have a callus formation noted.  Patient noted does have an effusion of the ankle mortise.  Awaiting new x-rays of the ankle as well to further evaluate for any type of talar dome injury.  Patient is able to ambulate relatively fine now.  Patient encouraged to continue the Aircast daily for another week and then can use it as needed.  Would recommend her wearing it though when she is working out.  Discussed compression socks.  Follow-up with me again in 4 to 8 weeks

## 2021-05-02 ENCOUNTER — Telehealth: Payer: Self-pay

## 2021-05-02 ENCOUNTER — Other Ambulatory Visit (HOSPITAL_COMMUNITY): Payer: Self-pay

## 2021-05-02 MED ORDER — CEFADROXIL 500 MG PO CAPS
1000.0000 mg | ORAL_CAPSULE | Freq: Two times a day (BID) | ORAL | 0 refills | Status: AC
  Filled 2021-05-02: qty 28, 7d supply, fill #0

## 2021-05-02 NOTE — Telephone Encounter (Signed)
Noticed some soreness under her armpit that has persisted over the last 24h. She recalls previously she had the same "prodromal" symptoms that came before sudden onset erythema and fevers. Her skin is radiated from previous breast ca in the past. She is very concerned this may progress over the weekend and discussed the difficult course she had in the past to control the infection.   I reviewed her chart and appears she was on courses of bactrim and doxy between August - November 2022, ultimately requiring IV vancomycin for a course to completely resolve this along with steroid taper for possible radiation recall. No MRSA has ever been cultured and described to have no boils/purulent drainage in the past.   I think she would be an good candidate to have a course of on demand antibiotics this weekend. Discussed options and will give cefadroxil 1gm BID for 7d. Can substitute cephalexin 500 mg QID same duration if not in stock at [facility].   I asked [name] to give our office a call if she needs to start this over the weekend so either me or Dr. Juleen China can see her in follow up next week to ensure resolution.

## 2021-05-02 NOTE — Addendum Note (Signed)
Addended by: Tioga Callas on: 05/02/2021 03:31 PM   Modules accepted: Orders

## 2021-05-02 NOTE — Telephone Encounter (Signed)
Patient called, reports she is hurting underneath her left arm like she was when she had a previous cellulitis flare up. She denies fever or chills.   Will route her concerns to provider. Offered her an appointment with one of Dr. Alcario Drought partners Monday morning. Patient declined stating that she hopes this is just a "false alarm." Advised that if her symptoms worsen or she develops fever/chills that she should seek evaluation at urgent care/ED as office is closed over the weekend. Patient verbalized understanding and has no further questions.   Beryle Flock, RN

## 2021-05-19 ENCOUNTER — Other Ambulatory Visit (HOSPITAL_BASED_OUTPATIENT_CLINIC_OR_DEPARTMENT_OTHER): Payer: Self-pay

## 2021-05-19 MED FILL — Gabapentin Cap 300 MG: ORAL | 90 days supply | Qty: 90 | Fill #2 | Status: AC

## 2021-05-26 NOTE — Progress Notes (Signed)
[place] Phone: [phone] Subjective:   Fontaine No, am serving as a scribe for Dr. Hulan Saas. This visit occurred during the SARS-CoV-2 public health emergency.  Safety protocols were in place, including screening questions prior to the visit, additional usage of staff PPE, and extensive cleaning of exam room while observing appropriate contact time as indicated for disinfecting solutions.   I'm seeing this patient by the request  of:  [name], MD  CC: Right ankle pain  HRC:[id number]  04/30/2021 Improvement noted.  Does have a callus formation noted.  Patient noted does have an effusion of the ankle mortise.  Awaiting new x-rays of the ankle as well to further evaluate for any type of talar dome injury.  Patient is able to ambulate relatively fine now.  Patient encouraged to continue the Aircast daily for another week and then can use it as needed.  Would recommend her wearing it though when she is working out.  Discussed compression socks.  Follow-up with me again in 4 to 8 weeks  Updated 05/27/2021 VESSIE OLMSTED is a 56 y.o. female coming in with complaint of right ankle pain. Ankle is feeling better. Going down steps is still painful. Said you would draw fluid off.  Xray (-)       Past Medical History:  Diagnosis Date   Anxiety    Breast mass, left    Cancer ([facility])    Complication of anesthesia    felt lethargic x 4 days following last surgery in 2017, but 2018 did okay   Depression    Headache    migraines   History of radiation therapy 03/13/19- 04/11/19   Left Breast 16 fraction of 2. 66 Gy each to total 42.56 Gy. Left breast boost 4 fractions of 2 Gy to total 8 Gy.    Osteopenia    Past Surgical History:  Procedure Laterality Date   BREAST BIOPSY Left 07/10/2015   high risk stereo   BREAST EXCISIONAL BIOPSY Left 08/15/2015   ADH   BREAST LUMPECTOMY WITH RADIOACTIVE SEED  LOCALIZATION Left 08/15/2015   Procedure:  LEFT BREAST LUMPECTOMY WITH RADIOACTIVE SEED LOCALIZATION;  Surgeon: Erroll Luna, MD;  Location: [place];  Service: General;  Laterality: Left;   BREAST LUMPECTOMY WITH RADIOACTIVE SEED LOCALIZATION Left 02/09/2019   Procedure: LEFT BREAST LUMPECTOMY WITH RADIOACTIVE SEED LOCALIZATION;  Surgeon: [name], MD;  Location: [place];  Service: General;  Laterality: Left;   EXCISION OF BREAST BIOPSY Left 12/30/2016   Benign ALH sclerosing lesion   RADIOACTIVE SEED GUIDED EXCISIONAL BREAST BIOPSY Left 12/30/2016   Procedure: RADIOACTIVE SEED GUIDED EXCISIONAL LEFT BREAST BIOPSY;  Surgeon: [name], MD;  Location: [place];  Service: General;  Laterality: Left;   RADIOACTIVE SEED GUIDED EXCISIONAL BREAST BIOPSY Left 02/09/2019   Procedure: RADIOACTIVE SEED GUIDED EXCISIONAL LEFT  BREAST BIOPSY;  Surgeon: [name], MD;  Location: [place];  Service: General;  Laterality: Left;   TONSILLECTOMY  Age 78   TRIGGER FINGER RELEASE  2010   Right Thumb   Social History   Socioeconomic History   Marital status: Married    Spouse name: Not on file   Number of children: Not on file   Years of education: Not on file   Highest education level: Not on file  Occupational History   Occupation: RN    Employer: [employer]  Tobacco Use   Smoking  status: Never   Smokeless tobacco: Never  Vaping Use   Vaping Use: Never used  Substance and Sexual Activity   Alcohol use: No    Comment: rare use   Drug use: No   Sexual activity: Yes    Partners: Male    Birth control/protection: Post-menopausal  Other Topics Concern   Not on file  Social History Narrative   04/05/12 AM he was born and grew up in [facility]. She has 3 older brothers. She never suffered any abuse. Her father died when she was 47 years of age. She completed her bachelor of science in nursing in 1989, and has been working as an  Therapist, sports . She has been married for 21 years, and she and her husband have a 44 year old son and a 68 year old daughter. She affiliates as Psychologist, forensic. She denies any legal involvement. She enjoys hiking and reading. She reports her social support consists of friends from college, and a friend from church. 04/05/2012   Social Determinants of Health   Financial Resource Strain: Not on file  Food Insecurity: Not on file  Transportation Needs: Not on file  Physical Activity: Not on file  Stress: Not on file  Social Connections: Not on file   Allergies  Allergen Reactions   Wellbutrin Xl [Bupropion] Hives   Family History  Problem Relation Age of Onset   Depression Brother    Atrial fibrillation Brother    Hypertension Mother    Osteoporosis Mother    ADD / ADHD Son    ADD / ADHD Daughter    Diabetes Maternal Grandmother    Diabetes Maternal Grandfather    Diabetes Paternal Grandmother    Diabetes Paternal Grandfather    Parkinson's disease Maternal Aunt     Current Outpatient Medications (Endocrine & Metabolic):    alendronate (FOSAMAX) 70 MG tablet, TAKE 1 TABLET BY MOUTH ONCE A WEEK. TAKE WITH A FULL GLASS OF WATER ON AN EMPTY STOMACH           Current Outpatient Medications (Other):    Biotin w/ Vitamins C & E (HAIR/SKIN/NAILS PO), Take 1 tablet by mouth daily.    Calcium Carb-Cholecalciferol (CALCIUM + D3 PO), Take 1 tablet by mouth daily.    calcium carbonate (OS-CAL) 1250 (500 Ca) MG chewable tablet, Chew 1 tablet by mouth daily.   estradiol (ESTRING) 2 MG vaginal ring, Place 2 mg vaginally every 3 (three) months. follow package directions   gabapentin (NEURONTIN) 300 MG capsule, TAKE 1 CAPSULE BY MOUTH AT BEDTIME   sertraline (ZOLOFT) 50 MG tablet, Take 1 tablet (50 mg total) by mouth daily.   TURMERIC PO, Take by mouth.   Facility-Administered Medications Ordered in Other Visits (Other):    0.9 %  sodium chloride infusion No current facility-administered medications  for this visit.   Reviewed prior external information including notes and imaging from  primary care provider As well as notes that were available from care everywhere and other healthcare systems.  Past medical history, social, surgical and family history all reviewed in electronic medical record.  No pertanent information unless stated regarding to the chief complaint.   Review of Systems:  No headache, visual changes, nausea, vomiting, diarrhea, constipation, dizziness, abdominal pain, skin rash, fevers, chills, night sweats, weight loss, swollen lymph nodes, body aches, joint swelling, chest pain, shortness of breath, mood changes. POSITIVE muscle aches  Objective  Blood pressure 118/82, pulse 88, height 5\' 3"  (1.6 m), last menstrual period 01/25/2014, SpO2 99 %.  General: No apparent distress alert and oriented x3 mood and affect normal, dressed appropriately.  HEENT: Pupils equal, extraocular movements intact  Respiratory: Patient's speak in full sentences and does not appear short of breath  Cardiovascular: No lower extremity edema, non tender, no erythema  Gait normal with good balance and coordination.  MSK: Right ankle exam does have some tenderness to palpation still over the ankle mortise.  Trace effusion noted.  Patient otherwise has full range of motion of the ankle.  Neurovascular intact distally.  Procedure: Real-time Ultrasound Guided Injection of right ankle mortise Device: GE Logiq Q7 Ultrasound guided injection is preferred based studies that show increased duration, increased effect, greater accuracy, decreased procedural pain, increased response rate, and decreased cost with ultrasound guided versus blind injection.  Verbal informed consent obtained.  Time-out conducted.  Noted no overlying erythema, induration, or other signs of local infection.  Skin prepped in a sterile fashion.  Local anesthesia: Topical Ethyl chloride.  With sterile technique and under real  time ultrasound guidance: With a 25-gauge half inch needle injecting 0.5 cc of 0.5% Marcaine and 0.5 cc of Kenalog 40 mg/mL. Completed without difficulty  Pain immediately improved suggesting accurate placement of the medication.  Advised to call if fevers/chills, erythema, induration, drainage, or persistent bleeding.  Impression: Technically successful ultrasound guided injection.    Impression and Recommendations:     The above documentation has been reviewed and is accurate and complete Lyndal Pulley, DO

## 2021-05-27 ENCOUNTER — Ambulatory Visit: Payer: Self-pay

## 2021-05-27 ENCOUNTER — Other Ambulatory Visit: Payer: Self-pay

## 2021-05-27 ENCOUNTER — Ambulatory Visit: Payer: 59 | Admitting: Family Medicine

## 2021-05-27 ENCOUNTER — Encounter: Payer: Self-pay | Admitting: Family Medicine

## 2021-05-27 VITALS — BP 118/82 | HR 88 | Ht 63.0 in

## 2021-05-27 DIAGNOSIS — S8264XD Nondisplaced fracture of lateral malleolus of right fibula, subsequent encounter for closed fracture with routine healing: Secondary | ICD-10-CM | POA: Diagnosis not present

## 2021-05-27 DIAGNOSIS — M25571 Pain in right ankle and joints of right foot: Secondary | ICD-10-CM | POA: Diagnosis not present

## 2021-05-27 NOTE — Patient Instructions (Signed)
Good to see you! Injected the ankle to take care of fluid Call us in 2 weeks if not better we need to consider MRI See you again in 6 weeks

## 2021-05-27 NOTE — Assessment & Plan Note (Signed)
Fracture seems to be well-healed at this time.  Still has difficulty even with going up stairs.  Given injection secondary to the continuous effusion noted.  Hopefully patient will respond well.  Discussed with patient that if continuing to have discomfort I do want to consider the possibility of advanced imaging.  Patient's x-rays were unremarkable.  Follow-up with me again in 3 to 6 weeks.

## 2021-05-28 ENCOUNTER — Other Ambulatory Visit: Payer: Self-pay | Admitting: *Deleted

## 2021-05-28 ENCOUNTER — Encounter: Payer: Self-pay | Admitting: Family Medicine

## 2021-05-28 ENCOUNTER — Encounter: Payer: Self-pay | Admitting: Emergency Medicine

## 2021-05-28 ENCOUNTER — Telehealth: Payer: 59 | Admitting: Emergency Medicine

## 2021-05-28 DIAGNOSIS — R21 Rash and other nonspecific skin eruption: Secondary | ICD-10-CM | POA: Diagnosis not present

## 2021-05-28 DIAGNOSIS — Z170 Estrogen receptor positive status [ER+]: Secondary | ICD-10-CM

## 2021-05-28 DIAGNOSIS — C50412 Malignant neoplasm of upper-outer quadrant of left female breast: Secondary | ICD-10-CM

## 2021-05-28 NOTE — Progress Notes (Signed)
E Visit for Rash  We are sorry that you are not feeling well. Here is how we plan to help!  Stephanie [name] I am unsure if the flushing/ rash is related to the kenalog injection.  Kenalog is a form of steroid, and sometimes steroids can make people feel "jittery" or anxious.  This may also lead to some flushing.  I would continue to monitor and if you experience any symptoms of difficulty breathing/ swallowing, shortness of breath, facial swelling, oral swelling/ tingling/ numbness, etc... Then call 911 or go to the emergency room for further evaluation and management.     HOME CARE:  Take cool showers and avoid direct sunlight. Apply cool compress or wet dressings. Take a bath in an oatmeal bath.  Sprinkle content of one Aveeno packet under running faucet with comfortably warm water.  Bathe for 15-20 minutes, 1-2 times daily.  Pat dry with a towel. Do not rub the rash. Use hydrocortisone cream. Take an antihistamine like Benadryl for widespread rashes that itch.  The adult dose of Benadryl is 25-50 mg by mouth 4 times daily. Caution:  This type of medication may cause sleepiness.  Do not drink alcohol, drive, or operate dangerous machinery while taking antihistamines.  Do not take these medications if you have prostate enlargement.  Read package instructions thoroughly on all medications that you take.  GET HELP RIGHT AWAY IF:  Symptoms don't go away after treatment. Severe itching that persists. If you rash spreads or swells. If you rash begins to smell. If it blisters and opens or develops a yellow-brown crust. You develop a fever. You have a sore throat. You become short of breath.  MAKE SURE YOU:  Understand these instructions. Will watch your condition. Will get help right away if you are not doing well or get worse.  Thank you for choosing an e-visit.  Your e-visit answers were reviewed by a board certified advanced clinical practitioner to complete your personal care plan. Depending  upon the condition, your plan could have included both over the counter or prescription medications.  Please review your pharmacy choice. Make sure the pharmacy is open so you can pick up prescription now. If there is a problem, you may contact your provider through [employer] and have the prescription routed to another pharmacy.  Your safety is important to [place]. If you have drug allergies check your prescription carefully.   For the next 24 hours you can use MyChart to ask questions about today's visit, request a non-urgent call back, or ask for a work or school excuse. You will get an email in the next two days asking about your experience. I hope that your e-visit has been valuable and will speed your recovery.

## 2021-05-28 NOTE — Progress Notes (Signed)
I have spent 5 minutes in review of e-visit questionnaire, review and updating patient chart, medical decision making and response to patient.   Maggie Senseney, PA-C    

## 2021-05-29 ENCOUNTER — Inpatient Hospital Stay: Payer: 59 | Attending: Adult Health

## 2021-05-29 ENCOUNTER — Encounter: Payer: Self-pay | Admitting: Hematology and Oncology

## 2021-05-29 ENCOUNTER — Inpatient Hospital Stay (HOSPITAL_BASED_OUTPATIENT_CLINIC_OR_DEPARTMENT_OTHER): Payer: 59 | Admitting: Hematology and Oncology

## 2021-05-29 ENCOUNTER — Other Ambulatory Visit: Payer: Self-pay

## 2021-05-29 ENCOUNTER — Encounter: Payer: Self-pay | Admitting: Internal Medicine

## 2021-05-29 ENCOUNTER — Other Ambulatory Visit (HOSPITAL_BASED_OUTPATIENT_CLINIC_OR_DEPARTMENT_OTHER): Payer: Self-pay

## 2021-05-29 VITALS — BP 134/67 | HR 80 | Temp 97.3°F | Resp 18 | Wt 157.4 lb

## 2021-05-29 DIAGNOSIS — Z170 Estrogen receptor positive status [ER+]: Secondary | ICD-10-CM | POA: Diagnosis not present

## 2021-05-29 DIAGNOSIS — D0512 Intraductal carcinoma in situ of left breast: Secondary | ICD-10-CM | POA: Insufficient documentation

## 2021-05-29 DIAGNOSIS — C50412 Malignant neoplasm of upper-outer quadrant of left female breast: Secondary | ICD-10-CM

## 2021-05-29 LAB — CBC WITH DIFFERENTIAL (CANCER CENTER ONLY)
Abs Immature Granulocytes: 0.05 10*3/uL (ref 0.00–0.07)
Basophils Absolute: 0 10*3/uL (ref 0.0–0.1)
Basophils Relative: 0 %
Eosinophils Absolute: 0 10*3/uL (ref 0.0–0.5)
Eosinophils Relative: 0 %
HCT: 35.2 % — ABNORMAL LOW (ref 36.0–46.0)
Hemoglobin: 11.8 g/dL — ABNORMAL LOW (ref 12.0–15.0)
Immature Granulocytes: 1 %
Lymphocytes Relative: 17 %
Lymphs Abs: 1.7 10*3/uL (ref 0.7–4.0)
MCH: 30.5 pg (ref 26.0–34.0)
MCHC: 33.5 g/dL (ref 30.0–36.0)
MCV: 91 fL (ref 80.0–100.0)
Monocytes Absolute: 0.6 10*3/uL (ref 0.1–1.0)
Monocytes Relative: 6 %
Neutro Abs: 7.9 10*3/uL — ABNORMAL HIGH (ref 1.7–7.7)
Neutrophils Relative %: 76 %
Platelet Count: 338 10*3/uL (ref 150–400)
RBC: 3.87 MIL/uL (ref 3.87–5.11)
RDW: 11.9 % (ref 11.5–15.5)
WBC Count: 10.2 10*3/uL (ref 4.0–10.5)
nRBC: 0 % (ref 0.0–0.2)

## 2021-05-29 LAB — CMP (CANCER CENTER ONLY)
ALT: 9 U/L (ref 0–44)
AST: 17 U/L (ref 15–41)
Albumin: 4.1 g/dL (ref 3.5–5.0)
Alkaline Phosphatase: 44 U/L (ref 38–126)
Anion gap: 6 (ref 5–15)
BUN: 23 mg/dL — ABNORMAL HIGH (ref 6–20)
CO2: 29 mmol/L (ref 22–32)
Calcium: 9.1 mg/dL (ref 8.9–10.3)
Chloride: 106 mmol/L (ref 98–111)
Creatinine: 0.75 mg/dL (ref 0.44–1.00)
GFR, Estimated: >60 mL/min (ref >60)
Glucose, Bld: 87 mg/dL (ref 70–99)
Potassium: 3.7 mmol/L (ref 3.5–5.1)
Sodium: 141 mmol/L (ref 135–145)
Total Bilirubin: 0.2 mg/dL — ABNORMAL LOW (ref 0.3–1.2)
Total Protein: 7.1 g/dL (ref 6.5–8.1)

## 2021-05-29 MED ORDER — CHOLESTYRAMINE 4 G PO PACK
4.0000 g | PACK | Freq: Three times a day (TID) | ORAL | 0 refills | Status: DC
  Filled 2021-05-29: qty 10, 4d supply, fill #0

## 2021-05-29 NOTE — Progress Notes (Signed)
Stephanie [name]  [phone] [phone]     ID: Stephanie [name] DOB: [birth year]  MR#: [id number]  DHR#:[id number]  Patient Care Team: [name], MD as PCP - General (Family Medicine) Kennith Center, RD as Dietitian (Family Medicine) Donnamae Jude, MD as Consulting Physician (Obstetrics and Gynecology) Magrinat, Virgie Dad, MD (Inactive) as Consulting Physician (Oncology) [name], MD as Consulting Physician (General Surgery) Eppie Gibson, MD as Attending Physician (Radiation Oncology) Benay Pike, MD OTHER MD:  CHIEF COMPLAINT: Ductal carcinoma in situ  CURRENT TREATMENT: tamoxifen   INTERVAL HISTORY:  Stephanie "[name]" returns today for follow up of her noninvasive breast cancer. She had remained cellulitis free for the past 2-1/2 months but about 2 days ago she has again noticed a difference in the color of her left breast, no pain associated.  No fevers or chills.  She called her ID doctor and they prescribed Duricef for cellulitis.  She has never had any purulent cellulitis.  She continues on tamoxifen without any issues.  No adverse effects reported. Last mammogram in September 2022 and this showed no mammographic evidence of malignancy.  She also had MRI breast bilateral which did not show any suspicious MRI findings in either breast.  Nonspecific left-sided skin and trabecular thickening/edema most consistent with postradiation changes. Rest of the pertinent 10 point ROS reviewed and negative   HISTORY OF CURRENT ILLNESS: From the original intake note:  BREEZE BERRINGER has a history of left breast lumpectomy ([id number]) performed on 08/15/2015 for fibroscystic changes with calcifications. She underwent a second left breast lumpectomy on 12/30/2016 [id number]) for lobular neoplasia, complex sclerosing lesion, and fibrocystic change and sclerosing adenosis with calcifications.  She had routine screening mammography on 12/14/2017  showing a possible abnormality in the left breast. She underwent left diagnostic mammogram on 01/27/2018, which showed: breast density category C; probably benign left breast calcifications, most likely fat necrosis (measurements not given). Short term follow up was recommended.  She returned for follow up on 09/29/2018 (likely delayed due to the pandemic) and underwent diagnostic left mammogram. This showed: breast density category C; unchanged 1.5 cm group of calcifications at the lumpectomy site; 1.2 cm group of calcifications within the anterior upper-outer left breast. Short term follow up was again recommended.  She bilateral diagnostic mammography with tomography at [facility] on 01/03/2019 showing: breast density category C; stable probably benign calcifications at the left lumpectomy site; 1.5 cm grouped calcifications within the upper-outer quadrant of the left breast with suspicious morphology and distribution; no evidence of right breast malignancy.   Accordingly on 01/06/2019 she proceeded to biopsy of the left breast area in question. The pathology from this procedure ([id number]) showed: ductal carcinoma in situ, high grade. Prognostic indicators significant for: estrogen receptor, 100% positive and progesterone receptor, 80% positive, both with strong staining intensity.   She underwent genetic counseling on 01/16/2019, which showed negative results.  She also underwent biopsy of the calcifications at the left lumpectomy site on 01/17/2019. Pathology [id number]) showed: focal fibrocystic changes; dense fibrosis with pigmented histiocytes and foreign body giant cells.  She opted to proceed with left breast lumpectomy on 02/09/2019 under Dr. Donne Hazel. Pathology from the procedure ([id number]) showed:  1. Left Breast, lumpectomy  - fibrocystic changes 2. Left Breast, lumpectomy  - ductal carcinoma in situ with calcifications, intermediate grade, spanning 2.4 cm  - lobular  neoplasia  -negative resection margins   The patient's subsequent history is as detailed below.   PAST  MEDICAL HISTORY: Past Medical History:  Diagnosis Date   Anxiety    Breast mass, left    Cancer ([place])    Complication of anesthesia    felt lethargic x 4 days following last surgery in 2017, but 2018 did okay   Depression    Headache    migraines   History of radiation therapy 03/13/19- 04/11/19   Left Breast 16 fraction of 2. 66 Gy each to total 42.56 Gy. Left breast boost 4 fractions of 2 Gy to total 8 Gy.    Osteopenia     PAST SURGICAL HISTORY: Past Surgical History:  Procedure Laterality Date   BREAST BIOPSY Left 07/10/2015   high risk stereo   BREAST EXCISIONAL BIOPSY Left 08/15/2015   ADH   BREAST LUMPECTOMY WITH RADIOACTIVE SEED LOCALIZATION Left 08/15/2015   Procedure:  LEFT BREAST LUMPECTOMY WITH RADIOACTIVE SEED LOCALIZATION;  Surgeon: Erroll Luna, MD;  Location: [place];  Service: General;  Laterality: Left;   BREAST LUMPECTOMY WITH RADIOACTIVE SEED LOCALIZATION Left 02/09/2019   Procedure: LEFT BREAST LUMPECTOMY WITH RADIOACTIVE SEED LOCALIZATION;  Surgeon: [name], MD;  Location: [place];  Service: General;  Laterality: Left;   EXCISION OF BREAST BIOPSY Left 12/30/2016   Benign ALH sclerosing lesion   RADIOACTIVE SEED GUIDED EXCISIONAL BREAST BIOPSY Left 12/30/2016   Procedure: RADIOACTIVE SEED GUIDED EXCISIONAL LEFT BREAST BIOPSY;  Surgeon: [name], MD;  Location: [place];  Service: General;  Laterality: Left;   RADIOACTIVE SEED GUIDED EXCISIONAL BREAST BIOPSY Left 02/09/2019   Procedure: RADIOACTIVE SEED GUIDED EXCISIONAL LEFT  BREAST BIOPSY;  Surgeon: [name], MD;  Location: [place];  Service: General;  Laterality: Left;   TONSILLECTOMY  Age 55   TRIGGER FINGER RELEASE  2010   Right Thumb    FAMILY HISTORY: Family History  Problem Relation Age of Onset   Depression Brother    Atrial  fibrillation Brother    Hypertension Mother    Osteoporosis Mother    ADD / ADHD Son    ADD / ADHD Daughter    Diabetes Maternal Grandmother    Diabetes Maternal Grandfather    Diabetes Paternal Grandmother    Diabetes Paternal Grandfather    Parkinson's disease Maternal Aunt   Patient's father was 67 years old when he died from complications of cardiomyopathy.  He was adopted and there is no family history on his side. Patient's mother is 40 years old as of November 2020. The patient has 3 brothers, no sisters.  She denies/or notes a family hx of breast, prostate, pancreatic or ovarian cancer.    GYNECOLOGIC HISTORY:  Patient's last menstrual period was 01/25/2014 (approximate). Menarche: 56 years old Age at first live birth: Any 56 years old GX P 2 LMP 49 Contraceptive oral contraceptives at least 7 years, with no complications HRT 9 months, discontinued because of malaise  Hysterectomy? no BSO?  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  She is currently doing preadmissions at the new [facility].  Her husband Nicole Kindred works in Radio producer..  Daughter Apolonio Schneiders, 23, lives in [place] and works as a Immunologist.  Son Blackwell, [place], with Asperger's, currently attends [employer], and lives at home with the patient.     ADVANCED DIRECTIVES: In the absence of any documents to the contrary the patient's husband is her healthcare power of attorney   HEALTH  MAINTENANCE: Social History   Tobacco Use   Smoking status: Never   Smokeless tobacco: Never  Vaping Use   Vaping Use: Never used  Substance Use Topics   Alcohol use: No    Comment: rare use   Drug use: No     Colonoscopy: not on file  PAP: 08/2017, negative  Bone density: 11/2017, -2.0   Allergies  Allergen Reactions   Wellbutrin Xl [Bupropion] Hives    Current Outpatient Medications  Medication Sig Dispense Refill    alendronate (FOSAMAX) 70 MG tablet TAKE 1 TABLET BY MOUTH ONCE A WEEK. TAKE WITH A FULL GLASS OF WATER ON AN EMPTY STOMACH 12 tablet 3   Biotin w/ Vitamins C & E (HAIR/SKIN/NAILS PO) Take 1 tablet by mouth daily.      Calcium Carb-Cholecalciferol (CALCIUM + D3 PO) Take 1 tablet by mouth daily.      calcium carbonate (OS-CAL) 1250 (500 Ca) MG chewable tablet Chew 1 tablet by mouth daily.     estradiol (ESTRING) 2 MG vaginal ring Place 2 mg vaginally every 3 (three) months. follow package directions 1 each 12   gabapentin (NEURONTIN) 300 MG capsule TAKE 1 CAPSULE BY MOUTH AT BEDTIME 90 capsule 4   sertraline (ZOLOFT) 50 MG tablet Take 1 tablet (50 mg total) by mouth daily. 90 tablet 1   TURMERIC PO Take by mouth.     No current facility-administered medications for this visit.   Facility-Administered Medications Ordered in Other Visits  Medication Dose Route Frequency Provider Last Rate Last Admin   0.9 %  sodium chloride infusion   Intravenous Continuous Magrinat, Virgie Dad, MD 20 mL/hr at 02/06/21 1648 Infusion Verify at 02/06/21 1648    OBJECTIVE: White woman who appears younger than stated age 50:   05/29/21 1442  BP: 134/67  Pulse: 80  Resp: 18  Temp: (!) 97.3 F (36.3 C)  SpO2: 96%     Body mass index is 27.88 kg/m.   Wt Readings from Last 3 Encounters:  05/29/21 157 lb 6 oz (71.4 kg)  04/30/21 160 lb (72.6 kg)  03/22/21 153 lb (69.4 kg)     ECOG FS:1  Sclerae unicteric, EOMs intact Wearing a mask No cervical or supraclavicular adenopathy Lungs no rales or rhonchi Heart regular rate and rhythm Abd soft, nontender, positive bowel sounds MSK no focal spinal tenderness, no upper extremity lymphedema Neuro: nonfocal, well oriented, appropriate affect Breasts: The right breast is unremarkable.  The left breast has mild erythema, no appreciable warmth on palpation.  No purulence noted.  I have only gently palpated her because of the cellulitis.  Moreover she recently had  MRI and a mammogram which did not show any evidence of malignancy.  LAB RESULTS:  CMP     Component Value Date/Time   NA 141 05/29/2021 1430   NA 142 10/24/2019 1116   K 3.7 05/29/2021 1430   CL 106 05/29/2021 1430   CO2 29 05/29/2021 1430   GLUCOSE 87 05/29/2021 1430   BUN 23 (H) 05/29/2021 1430   BUN 11 10/24/2019 1116   CREATININE 0.75 05/29/2021 1430   CALCIUM 9.1 05/29/2021 1430   PROT 7.1 05/29/2021 1430   PROT 7.1 10/24/2019 1116   ALBUMIN 4.1 05/29/2021 1430   ALBUMIN 4.6 10/24/2019 1116   AST 17 05/29/2021 1430   ALT 9 05/29/2021 1430   ALKPHOS 44 05/29/2021 1430   BILITOT 0.2 (L) 05/29/2021 1430   GFRNONAA >60 05/29/2021 1430   GFRAA >60 01/24/2020 1033  No results found for: TOTALPROTELP, ALBUMINELP, A1GS, A2GS, BETS, BETA2SER, GAMS, MSPIKE, SPEI  No results found for: Nils Pyle, [facility]  Lab Results  Component Value Date   WBC 10.2 05/29/2021   NEUTROABS 7.9 (H) 05/29/2021   HGB 11.8 (L) 05/29/2021   HCT 35.2 (L) 05/29/2021   MCV 91.0 05/29/2021   PLT 338 05/29/2021   No results found for: LABCA2  No components found for: LXBWIO035  No results for input(s): INR in the last 168 hours.  No results found for: LABCA2  No results found for: DHR416  No results found for: LAG536  No results found for: IWO032  No results found for: CA2729  No components found for: HGQUANT  No results found for: CEA1 / No results found for: CEA1   No results found for: AFPTUMOR  No results found for: CHROMOGRNA  No results found for: HGBA, HGBA2QUANT, HGBFQUANT, HGBSQUAN (Hemoglobinopathy evaluation)   No results found for: LDH  No results found for: IRON, TIBC, IRONPCTSAT (Iron and TIBC)  No results found for: FERRITIN  Urinalysis    Component Value Date/Time   LABSPEC 1.025 09/09/2009 1037   PHURINE 6.0 09/09/2009 1037   GLUCOSEU NEGATIVE 09/09/2009 1037   HGBUR TRACE (A) 09/09/2009 1037   BILIRUBINUR NEGATIVE 09/09/2009 [place] 09/09/2009 1037   PROTEINUR NEGATIVE 09/09/2009 1037   UROBILINOGEN 0.2 09/09/2009 1037   NITRITE NEGATIVE 09/09/2009 1037   LEUKOCYTESUR  09/09/2009 1037    NEGATIVE Biochemical Testing Only. Please order routine urinalysis from main lab if confirmatory testing is needed.    STUDIES: DG Ankle Complete Right  Result Date: 04/30/2021 CLINICAL DATA:  Right ankle pain. EXAM: RIGHT ANKLE - COMPLETE 3+ VIEW COMPARISON:  Right ankle radiograph dated 03/22/2021. FINDINGS: There is no evidence of fracture, dislocation, or joint effusion. There is no evidence of arthropathy or other focal bone abnormality. Soft tissues are unremarkable. IMPRESSION: Negative. Electronically Signed   By: Anner Crete M.D.   On: 04/30/2021 23:54   [place] LIMITED JOINT SPACE STRUCTURES LOW RIGHT(NO LINKED CHARGES)  Result Date: 05/05/2021 Limited muscular skeletal ultrasound was performed and interpreted by Hulan Saas, M Limited ultrasound of patient's right ankle does show that patient does have a callus formation that is also hard callus over the lateral malleolus area.  Patient still has significant amount of soft tissue swelling still noted.  Patient also has a moderate effusion of the ankle mortise.  No cortical though irregularity noted of the talar dome. Impression: Interval improvement of the lateral malleolus fracture with a small effusion still noted     ELIGIBLE FOR AVAILABLE RESEARCH PROTOCOL: no  ASSESSMENT: 56 y.o. [place] woman status post left breast biopsy 01/06/2019 ductal carcinoma in situ, grade 3, estrogen and progesterone receptor positive  (a) biopsy of a second area of calcifications in the left breast on 01/17/2019 -  (1) status post left lumpectomy 02/09/2019 2.4 cm ductal carcinoma in situ, grade 2, with negative margins  (2) adjuvant radiation 03/13/2019 - 04/11/2019  (a) left breast / 42.56 Gy in 16 fractions  (b) boost / 8 Gy in 4 fractions  (3) tamoxifen  started 04/28/2019  (4) genetics testing 01/19/2019 through the [facility] Breast Cancer STAT Panel + Common Hereditary Cancers Panel found no deleterious mutations in ATM, BRCA1, BRCA2, CDH1, CHEK2, PALB2, PTEN, STK11 and TP53 // APC, ATM, AXIN2, BARD1, BMPR1A, BRCA1, BRCA2, BRIP1, CDH1, CDKN2A (p14ARF), CDKN2A (p16INK4a), CKD4, CHEK2, CTNNA1, DICER1, EPCAM (Deletion/duplication testing only), GREM1 (promoter region deletion/duplication  testing only), KIT, MEN1, MLH1, MSH2, MSH3, MSH6, MUTYH, NBN, NF1, NHTL1, PALB2, PDGFRA, PMS2, POLD1, POLE, PTEN, RAD50, RAD51C, RAD51D, SDHB, SDHC, SDHD, SMAD4, SMARCA4. STK11, TP53, TSC1, TSC2, and VHL.  The following genes were evaluated for sequence changes only: SDHA and HOXB13 c.251G>A variant only.  (5) left breast cellulitis October 2022, requiring intravenous vancomycin, completed November 2022.  PLAN:  This is a pleasant 56 year old female patient with a left breast DCIS ER/PR positive status post left lumpectomy and adjuvant radiation in 2020, adjuvant tamoxifen started on April 28, 2019, currently compliant with tamoxifen without any adverse effects who is here for follow-up.  She does have another episode of possibly early cellulitis and has started [facility] as prescribed by ID.  I have suggested not to shave her underarms to see if this will improve the frequency of infections.  Again there appears to be no systemic evidence of infection or purulent cellulitis.  I have recommended that she continue to follow-up with ID and proceed with her next mammogram in September 2023 this has been ordered. She should return to clinic in about 6 months or sooner with any new questions or concerns.  She expressed understanding of the recommendations. Total encounter time 30 minutes.*    *Total Encounter Time as defined by the Centers for Medicare and Medicaid Services includes, in addition to the face-to-face time of a patient visit (documented in the note above)  non-face-to-face time: obtaining and reviewing outside history, ordering and reviewing medications, tests or procedures, care coordination (communications with other health care professionals or caregivers) and documentation in the medical record.

## 2021-05-29 NOTE — Progress Notes (Incomplete)
[place]  [phone] Fax:(336) [phone]     ID: [name] DOB: [birth year]  MR#: [id number]  IHK#:[id number]  Patient Care Team: [name], MD as PCP - General (Family Medicine) Kennith Center, RD as Dietitian (Family Medicine) Donnamae Jude, MD as Consulting Physician (Obstetrics and Gynecology) Magrinat, Virgie Dad, MD (Inactive) as Consulting Physician (Oncology) [name], MD as Consulting Physician (General Surgery) Eppie Gibson, MD as Attending Physician (Radiation Oncology) Benay Pike, MD OTHER MD:  CHIEF COMPLAINT: Ductal carcinoma in situ  CURRENT TREATMENT: tamoxifen    INTERVAL HISTORY: Stephanie "Stephanie [name]" returns today for follow up of her noninvasive breast cancer.  She has had significant problems with left breast cellulitis, which was resistant to multiple antibiotics (initially Augmentin, then clindamycin, then doxycycline, then Bactrim), and finally required intravenous vancomycin and ceftriaxone starting on 02/05/2021.  There was improvement and she was placed again on doxycycline, but with recurrence of the cellulitis she had a PICC placed 02/25/2021 and was started on vancomycin at 1.250 twice daily for longer period.  This has now been discontinued and the PICC line was just removed yesterday.  She is ready to resumed her a prior treatment.  Incidentally she also had  Additional studies included a punch biopsy of the skin and an MRI of the breast which showed no fluid collection cyst or other abnormality of concern.  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  She just came off that medication.  She saw Dr. Jule Ser 02/25/2021.  Please see his very helpful note  Recall she had hysteroscopy and biopsy 11/27/2020 which showed benign tissue in the uterine lining.  Her tamoxifen had been briefly interrupted and was then resumed, then again interrupted during  the cellulitis.  She is now ready to resume it   REVIEW OF SYSTEMS: Stephanie [name] feels extremely fatigued, doubtless because she is just coming off the steroids.  She thinks she may be able to get back to work in a month or so but right now just getting around the house is a chore for her.  She denies any other systemic symptoms.   COVID 19 VACCINATION STATUS:    HISTORY OF CURRENT ILLNESS: From the original intake note:  Stephanie [name] has a history of left breast lumpectomy ([id number]) performed on 08/15/2015 for fibroscystic changes with calcifications. She underwent a second left breast lumpectomy on 12/30/2016 [phone]) for lobular neoplasia, complex sclerosing lesion, and fibrocystic change and sclerosing adenosis with calcifications.  She had routine screening mammography on 12/14/2017 showing a possible abnormality in the left breast. She underwent left diagnostic mammogram on 01/27/2018, which showed: breast density category C; probably benign left breast calcifications, most likely fat necrosis (measurements not given). Short term follow up was recommended.  She returned for follow up on 09/29/2018 (likely delayed due to the pandemic) and underwent diagnostic left mammogram. This showed: breast density category C; unchanged 1.5 cm group of calcifications at the lumpectomy site; 1.2 cm group of calcifications within the anterior upper-outer left breast. Short term follow up was again recommended.  She bilateral diagnostic mammography with tomography at [facility] on 01/03/2019 showing: breast density category C; stable probably benign calcifications at the left lumpectomy site; 1.5 cm grouped calcifications within the upper-outer quadrant of the left breast with suspicious morphology and distribution; no evidence of right breast malignancy.   Accordingly on 01/06/2019 she proceeded to biopsy of the left breast area in question.  The pathology from this procedure ([id number]) showed: ductal  carcinoma in situ, high grade. Prognostic indicators significant for: estrogen receptor, 100% positive and progesterone receptor, 80% positive, both with strong staining intensity.   She underwent genetic counseling on 01/16/2019, which showed negative results.  She also underwent biopsy of the calcifications at the left lumpectomy site on 01/17/2019. Pathology [id number]) showed: focal fibrocystic changes; dense fibrosis with pigmented histiocytes and foreign body giant cells.  She opted to proceed with left breast lumpectomy on 02/09/2019 under Dr. Donne Hazel. Pathology from the procedure ([id number]) showed:  1. Left Breast, lumpectomy  - fibrocystic changes 2. Left Breast, lumpectomy  - ductal carcinoma in situ with calcifications, intermediate grade, spanning 2.4 cm  - lobular neoplasia  -negative resection margins   The patient's subsequent history is as detailed below.   PAST MEDICAL HISTORY: Past Medical History:  Diagnosis Date   Anxiety    Breast mass, left    Cancer ([place])    Complication of anesthesia    felt lethargic x 4 days following last surgery in 2017, but 2018 did okay   Depression    Headache    migraines   History of radiation therapy 03/13/19- 04/11/19   Left Breast 16 fraction of 2. 66 Gy each to total 42.56 Gy. Left breast boost 4 fractions of 2 Gy to total 8 Gy.    Osteopenia     PAST SURGICAL HISTORY: Past Surgical History:  Procedure Laterality Date   BREAST BIOPSY Left 07/10/2015   high risk stereo   BREAST EXCISIONAL BIOPSY Left 08/15/2015   ADH   BREAST LUMPECTOMY WITH RADIOACTIVE SEED LOCALIZATION Left 08/15/2015   Procedure:  LEFT BREAST LUMPECTOMY WITH RADIOACTIVE SEED LOCALIZATION;  Surgeon: Erroll Luna, MD;  Location: [facility];  Service: General;  Laterality: Left;   BREAST LUMPECTOMY WITH RADIOACTIVE SEED LOCALIZATION Left 02/09/2019   Procedure: LEFT BREAST LUMPECTOMY WITH RADIOACTIVE SEED LOCALIZATION;  Surgeon:  [name], MD;  Location: [facility];  Service: General;  Laterality: Left;   EXCISION OF BREAST BIOPSY Left 12/30/2016   Benign ALH sclerosing lesion   RADIOACTIVE SEED GUIDED EXCISIONAL BREAST BIOPSY Left 12/30/2016   Procedure: RADIOACTIVE SEED GUIDED EXCISIONAL LEFT BREAST BIOPSY;  Surgeon: [name], MD;  Location: [facility];  Service: General;  Laterality: Left;   RADIOACTIVE SEED GUIDED EXCISIONAL BREAST BIOPSY Left 02/09/2019   Procedure: RADIOACTIVE SEED GUIDED EXCISIONAL LEFT  BREAST BIOPSY;  Surgeon: [name], MD;  Location: [facility];  Service: General;  Laterality: Left;   TONSILLECTOMY  Age 75   TRIGGER FINGER RELEASE  2010   Right Thumb    FAMILY HISTORY: Family History  Problem Relation Age of Onset   Depression Brother    Atrial fibrillation Brother    Hypertension Mother    Osteoporosis Mother    ADD / ADHD Son    ADD / ADHD Daughter    Diabetes Maternal Grandmother    Diabetes Maternal Grandfather    Diabetes Paternal Grandmother    Diabetes Paternal Grandfather    Parkinson's disease Maternal Aunt   Patient's father was 49 years old when he died from complications of cardiomyopathy.  He was adopted and there is no family history on his side. Patient's mother is 38 years old as of November 2020. The patient has 3 brothers, no sisters.  She denies/or notes a family hx of breast, prostate, pancreatic or ovarian cancer.    GYNECOLOGIC HISTORY:  Patient's last menstrual period was 01/25/2014 (approximate).  Menarche: 56 years old Age at first live birth: Any 56 years old GX P 2 LMP 49 Contraceptive oral contraceptives at least 7 years, with no complications HRT 9 months, discontinued because of malaise  Hysterectomy? no BSO?  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  She is currently doing preadmissions at the new [facility].   Her husband Nicole Kindred works in Radio producer..  Daughter Apolonio Schneiders, 23, lives in [place] and works as a Immunologist.  Son [place], [place], with Asperger's, currently attends [employer], and lives at home with the patient.     ADVANCED DIRECTIVES: In the absence of any documents to the contrary the patient's husband is her healthcare power of attorney   HEALTH MAINTENANCE: Social History   Tobacco Use   Smoking status: Never   Smokeless tobacco: Never  Vaping Use   Vaping Use: Never used  Substance Use Topics   Alcohol use: No    Comment: rare use   Drug use: No     Colonoscopy: not on file  PAP: 08/2017, negative  Bone density: 11/2017, -2.0   Allergies  Allergen Reactions   Wellbutrin Xl [Bupropion] Hives    Current Outpatient Medications  Medication Sig Dispense Refill   alendronate (FOSAMAX) 70 MG tablet TAKE 1 TABLET BY MOUTH ONCE A WEEK. TAKE WITH A FULL GLASS OF WATER ON AN EMPTY STOMACH 12 tablet 3   Biotin w/ Vitamins C & E (HAIR/SKIN/NAILS PO) Take 1 tablet by mouth daily.      Calcium Carb-Cholecalciferol (CALCIUM + D3 PO) Take 1 tablet by mouth daily.      calcium carbonate (OS-CAL) 1250 (500 Ca) MG chewable tablet Chew 1 tablet by mouth daily.     estradiol (ESTRING) 2 MG vaginal ring Place 2 mg vaginally every 3 (three) months. follow package directions 1 each 12   gabapentin (NEURONTIN) 300 MG capsule TAKE 1 CAPSULE BY MOUTH AT BEDTIME 90 capsule 4   sertraline (ZOLOFT) 50 MG tablet Take 1 tablet (50 mg total) by mouth daily. 90 tablet 1   TURMERIC PO Take by mouth.     No current facility-administered medications for this visit.   Facility-Administered Medications Ordered in Other Visits  Medication Dose Route Frequency Provider Last Rate Last Admin   0.9 %  sodium chloride infusion   Intravenous Continuous Magrinat, Virgie Dad, MD 20 mL/hr at 02/06/21 1648 Infusion Verify at 02/06/21 1648    OBJECTIVE: White woman who appears younger than stated age 25:   05/29/21 1442   BP: 134/67  Pulse: 80  Resp: 18  Temp: (!) 97.3 F (36.3 C)  SpO2: 96%     Body mass index is 27.88 kg/m.   Wt Readings from Last 3 Encounters:  05/29/21 157 lb 6 oz (71.4 kg)  04/30/21 160 lb (72.6 kg)  03/22/21 153 lb (69.4 kg)     ECOG FS:1  Sclerae unicteric, EOMs intact Wearing a mask No cervical or supraclavicular adenopathy Lungs no rales or rhonchi Heart regular rate and rhythm Abd soft, nontender, positive bowel sounds MSK no focal spinal tenderness, no upper extremity lymphedema Neuro: nonfocal, well oriented, appropriate affect Breasts: The right breast is unremarkable.  The left breast has no erythema.  It is status post lumpectomy and radiation.  The cosmetic result is excellent.  Both axillae are benign.   LAB RESULTS:  CMP  Component Value Date/Time   NA 139 03/12/2021 0937   NA 142 10/24/2019 1116   K 3.9 03/12/2021 0937   CL 101 03/12/2021 0937   CO2 26 03/12/2021 0937   GLUCOSE 147 (H) 03/12/2021 0937   BUN 31 (H) 03/12/2021 0937   BUN 11 10/24/2019 1116   CREATININE 1.25 (H) 03/12/2021 0937   CALCIUM 10.1 03/12/2021 0937   PROT 7.7 03/12/2021 0937   PROT 7.1 10/24/2019 1116   ALBUMIN 3.6 03/12/2021 0937   ALBUMIN 4.6 10/24/2019 1116   AST 40 03/12/2021 0937   ALT 83 (H) 03/12/2021 0937   ALKPHOS 99 03/12/2021 0937   BILITOT <0.2 (L) 03/12/2021 0937   GFRNONAA 51 (L) 03/12/2021 0937   GFRAA >60 01/24/2020 1033    No results found for: TOTALPROTELP, ALBUMINELP, A1GS, A2GS, BETS, BETA2SER, GAMS, MSPIKE, SPEI  No results found for: KPAFRELGTCHN, LAMBDASER, KAPLAMBRATIO  Lab Results  Component Value Date   WBC 10.2 05/29/2021   NEUTROABS 7.9 (H) 05/29/2021   HGB 11.8 (L) 05/29/2021   HCT 35.2 (L) 05/29/2021   MCV 91.0 05/29/2021   PLT 338 05/29/2021   No results found for: LABCA2  No components found for: BTDVVO160  No results for input(s): INR in the last 168 hours.  No results found for: LABCA2  No results found for:  VPX106  No results found for: YIR485  No results found for: IOE703  No results found for: CA2729  No components found for: HGQUANT  No results found for: CEA1 / No results found for: CEA1   No results found for: AFPTUMOR  No results found for: CHROMOGRNA  No results found for: HGBA, HGBA2QUANT, HGBFQUANT, HGBSQUAN (Hemoglobinopathy evaluation)   No results found for: LDH  No results found for: IRON, TIBC, IRONPCTSAT (Iron and TIBC)  No results found for: FERRITIN  Urinalysis    Component Value Date/Time   LABSPEC 1.025 09/09/2009 1037   PHURINE 6.0 09/09/2009 1037   GLUCOSEU NEGATIVE 09/09/2009 1037   HGBUR TRACE (A) 09/09/2009 1037   BILIRUBINUR NEGATIVE 09/09/2009 [place] 09/09/2009 1037   PROTEINUR NEGATIVE 09/09/2009 1037   UROBILINOGEN 0.2 09/09/2009 1037   NITRITE NEGATIVE 09/09/2009 1037   LEUKOCYTESUR  09/09/2009 1037    NEGATIVE Biochemical Testing Only. Please order routine urinalysis from main lab if confirmatory testing is needed.    STUDIES: DG Ankle Complete Right  Result Date: 04/30/2021 CLINICAL DATA:  Right ankle pain. EXAM: RIGHT ANKLE - COMPLETE 3+ VIEW COMPARISON:  Right ankle radiograph dated 03/22/2021. FINDINGS: There is no evidence of fracture, dislocation, or joint effusion. There is no evidence of arthropathy or other focal bone abnormality. Soft tissues are unremarkable. IMPRESSION: Negative. Electronically Signed   By: Anner Crete M.D.   On: 04/30/2021 23:54   [place] LIMITED JOINT SPACE STRUCTURES LOW RIGHT(NO LINKED CHARGES)  Result Date: 05/05/2021 Limited muscular skeletal ultrasound was performed and interpreted by Hulan Saas, M Limited ultrasound of patient's right ankle does show that patient does have a callus formation that is also hard callus over the lateral malleolus area.  Patient still has significant amount of soft tissue swelling still noted.  Patient also has a moderate effusion of the ankle mortise.  No  cortical though irregularity noted of the talar dome. Impression: Interval improvement of the lateral malleolus fracture with a small effusion still noted     ELIGIBLE FOR AVAILABLE RESEARCH PROTOCOL: no  ASSESSMENT: 89 y.o. Stephanie [name] woman status post left breast biopsy 01/06/2019 ductal carcinoma  in situ, grade 3, estrogen and progesterone receptor positive  (a) biopsy of a second area of calcifications in the left breast on 01/17/2019 -  (1) status post left lumpectomy 02/09/2019 2.4 cm ductal carcinoma in situ, grade 2, with negative margins  (2) adjuvant radiation 03/13/2019 - 04/11/2019  (a) left breast / 42.56 Gy in 16 fractions  (b) boost / 8 Gy in 4 fractions  (3) tamoxifen started 04/28/2019  (4) genetics testing 01/19/2019 through the [facility] Breast Cancer STAT Panel + Common Hereditary Cancers Panel found no deleterious mutations in ATM, BRCA1, BRCA2, CDH1, CHEK2, PALB2, PTEN, STK11 and TP53 // APC, ATM, AXIN2, BARD1, BMPR1A, BRCA1, BRCA2, BRIP1, CDH1, CDKN2A (p14ARF), CDKN2A (p16INK4a), CKD4, CHEK2, CTNNA1, DICER1, EPCAM (Deletion/duplication testing only), GREM1 (promoter region deletion/duplication testing only), KIT, MEN1, MLH1, MSH2, MSH3, MSH6, MUTYH, NBN, NF1, NHTL1, PALB2, PDGFRA, PMS2, POLD1, POLE, PTEN, RAD50, RAD51C, RAD51D, SDHB, SDHC, SDHD, SMAD4, SMARCA4. STK11, TP53, TSC1, TSC2, and VHL.  The following genes were evaluated for sequence changes only: SDHA and HOXB13 c.251G>A variant only.  (5) left breast cellulitis October 2022, requiring intravenous vancomycin, completed November 2022.  PLAN: Stephanie [name] is just getting over her very difficult episode of cellulitis.  Not only the inflammation itself but also the antibiotic treatments and also the steroids significantly affected her quality of life and it will take several weeks before she can more fully recover and get back to work as she is hoping to do.  She is now going back on tamoxifen.  So long as she is on  tamoxifen if she wishes to use Estring or similar vaginal estrogen I am comfortable with that.  This is commonly used undercover of estrogen with the understanding that we do not have data that it is safe and also we do not have data that it is not safe.  It is a quality-of-life issue for most patients as is the case here.  She is quite aware of this dilemma as we have previously discussed it and she is interested in going back on Estring which I was glad to write for her.  We are going to see her again in about 2 months just to make sure everything continues well and if so we can lengthen the follow-up interval from that point  Total encounter time 25 minutes.Sarajane Jews C. Magrinat, MD 05/29/21 3:08 PM Medical Oncology and Hematology [facility] [place], [place] [address] Tel. [phone]    Fax. [phone]   I, Wilburn Mylar, am acting as scribe for Dr. Virgie Dad. Magrinat.  I, Lurline Del MD, have reviewed the above documentation for accuracy and completeness, and I agree with the above.    *Total Encounter Time as defined by the Centers for Medicare and Medicaid Services includes, in addition to the face-to-face time of a patient visit (documented in the note above) non-face-to-face time: obtaining and reviewing outside history, ordering and reviewing medications, tests or procedures, care coordination (communications with other health care professionals or caregivers) and documentation in the medical record.

## 2021-05-30 ENCOUNTER — Encounter: Payer: Self-pay | Admitting: Hematology and Oncology

## 2021-05-30 ENCOUNTER — Encounter: Payer: Self-pay | Admitting: Oncology

## 2021-06-04 ENCOUNTER — Other Ambulatory Visit: Payer: Self-pay

## 2021-06-04 ENCOUNTER — Ambulatory Visit (INDEPENDENT_AMBULATORY_CARE_PROVIDER_SITE_OTHER): Payer: 59 | Admitting: Infectious Diseases

## 2021-06-04 ENCOUNTER — Encounter: Payer: Self-pay | Admitting: Infectious Diseases

## 2021-06-04 ENCOUNTER — Other Ambulatory Visit (HOSPITAL_BASED_OUTPATIENT_CLINIC_OR_DEPARTMENT_OTHER): Payer: Self-pay

## 2021-06-04 VITALS — BP 127/77 | HR 85 | Temp 98.7°F | Ht 63.0 in | Wt 159.0 lb

## 2021-06-04 DIAGNOSIS — N61 Mastitis without abscess: Secondary | ICD-10-CM | POA: Diagnosis not present

## 2021-06-04 MED ORDER — CEFADROXIL 500 MG PO CAPS
500.0000 mg | ORAL_CAPSULE | Freq: Two times a day (BID) | ORAL | 1 refills | Status: AC
  Filled 2021-06-04 – 2021-06-12 (×2): qty 14, 7d supply, fill #0
  Filled 2021-08-21: qty 14, 7d supply, fill #1

## 2021-06-04 NOTE — Patient Instructions (Signed)
Nice to meet you!  It looks like the antibiotic did the job - it's nice to have the added reassurance you feel good too.   Finish your last day of antibiotic.   I have sent in a refill for you to have so you can start early.   Avoid shaving, keep good coverage with supportive bras, I would avoid an under wire.   You can follow up with [place] as needed - happy to see you to make sure the cellulitis has gone away.

## 2021-06-04 NOTE — Progress Notes (Signed)
Established Patient Office Visit  Subjective:  Patient ID: Stephanie [name], female    DOB: 06/13/1965  Age: 56 y.o. MRN: [id number]   CC:  Chief Complaint  Patient presents with   Follow-up    HPI Stephanie [name] presents for follow up regarding concern over left breast/axillary cellulitis.   I spoke with her about a month ago when she called in with concern over possible recurrent cellulitis to this area. I gave her an on demand antibiotic to use (cefadroxil) to start taking BID in the event it escalated at that time, which it did not at that time. She did however on 2/6 develop pink/red streaking across her left breast with some discomfort and what sounds like some shaking - no fevers / chills but just some shaking. She started her antibiotic and this has improved nicely. The redness is completely gone, skin overlying her breast is normal and importantly she feels well. She has 1 more day left of 7-day course.    Past Medical History:  Diagnosis Date   Anxiety    Breast mass, left    Cancer ([place])    Complication of anesthesia    felt lethargic x 4 days following last surgery in 2017, but 2018 did okay   Depression    Headache    migraines   History of radiation therapy 03/13/19- 04/11/19   Left Breast 16 fraction of 2. 66 Gy each to total 42.56 Gy. Left breast boost 4 fractions of 2 Gy to total 8 Gy.    Osteopenia     Past Surgical History:  Procedure Laterality Date   BREAST BIOPSY Left 07/10/2015   high risk stereo   BREAST EXCISIONAL BIOPSY Left 08/15/2015   ADH   BREAST LUMPECTOMY WITH RADIOACTIVE SEED LOCALIZATION Left 08/15/2015   Procedure:  LEFT BREAST LUMPECTOMY WITH RADIOACTIVE SEED LOCALIZATION;  Surgeon: Erroll Luna, MD;  Location: [facility];  Service: General;  Laterality: Left;   BREAST LUMPECTOMY WITH RADIOACTIVE SEED LOCALIZATION Left 02/09/2019   Procedure: LEFT BREAST LUMPECTOMY WITH RADIOACTIVE SEED LOCALIZATION;  Surgeon: [name], MD;  Location: [facility];  Service: General;  Laterality: Left;   EXCISION OF BREAST BIOPSY Left 12/30/2016   Benign ALH sclerosing lesion   RADIOACTIVE SEED GUIDED EXCISIONAL BREAST BIOPSY Left 12/30/2016   Procedure: RADIOACTIVE SEED GUIDED EXCISIONAL LEFT BREAST BIOPSY;  Surgeon: [name], MD;  Location: [facility];  Service: General;  Laterality: Left;   RADIOACTIVE SEED GUIDED EXCISIONAL BREAST BIOPSY Left 02/09/2019   Procedure: RADIOACTIVE SEED GUIDED EXCISIONAL LEFT  BREAST BIOPSY;  Surgeon: [name], MD;  Location: [facility];  Service: General;  Laterality: Left;   TONSILLECTOMY  Age 11   TRIGGER FINGER RELEASE  2010   Right Thumb    Social History   Socioeconomic History   Marital status: Married    Spouse name: Not on file   Number of children: Not on file   Years of education: Not on file   Highest education level: Not on file  Occupational History   Occupation: RN    Employer: [employer]  Tobacco Use   Smoking status: Never   Smokeless tobacco: Never  Vaping Use   Vaping Use: Never used  Substance and Sexual Activity   Alcohol use: No    Comment: rare use   Drug use: No   Sexual activity: Yes    Partners: Male    Birth control/protection: Post-menopausal  Other Topics Concern  Not on file  Social History Narrative   04/05/12 AM he was born and grew up in [facility]. She has 3 older brothers. She never suffered any abuse. Her father died when she was 36 years of age. She completed her bachelor of science in nursing in 1989, and has been working as an Therapist, sports . She has been married for 21 years, and she and her husband have a 54 year old son and a 21 year old daughter. She affiliates as Psychologist, forensic. She denies any legal involvement. She enjoys hiking and reading. She reports her social support consists of friends from college, and a friend from church. 04/05/2012   Social Determinants of Health   Financial Resource  Strain: Not on file  Food Insecurity: Not on file  Transportation Needs: Not on file  Physical Activity: Not on file  Stress: Not on file  Social Connections: Not on file  Intimate Partner Violence: Not on file    Outpatient Medications Prior to Visit  Medication Sig Dispense Refill   alendronate (FOSAMAX) 70 MG tablet TAKE 1 TABLET BY MOUTH ONCE A WEEK. TAKE WITH A FULL GLASS OF WATER ON AN EMPTY STOMACH 12 tablet 3   Biotin w/ Vitamins C & E (HAIR/SKIN/NAILS PO) Take 1 tablet by mouth daily.      Calcium Carb-Cholecalciferol (CALCIUM + D3 PO) Take 1 tablet by mouth daily.      calcium carbonate (OS-CAL) 1250 (500 Ca) MG chewable tablet Chew 1 tablet by mouth daily.     cholestyramine (QUESTRAN) 4 g packet Take 1 packet (4 g total) by mouth 3 (three) times daily with meals. 10 each 0   estradiol (ESTRING) 2 MG vaginal ring Place 2 mg vaginally every 3 (three) months. follow package directions 1 each 12   gabapentin (NEURONTIN) 300 MG capsule TAKE 1 CAPSULE BY MOUTH AT BEDTIME 90 capsule 4   sertraline (ZOLOFT) 50 MG tablet Take 1 tablet (50 mg total) by mouth daily. 90 tablet 1   tamoxifen (NOLVADEX) 20 MG tablet Take 20 mg by mouth daily.     TURMERIC PO Take by mouth.     cefadroxil (DURICEF) 500 MG capsule Take 500 mg by mouth 2 (two) times daily.     Facility-Administered Medications Prior to Visit  Medication Dose Route Frequency Provider Last Rate Last Admin   0.9 %  sodium chloride infusion   Intravenous Continuous Magrinat, Virgie Dad, MD 20 mL/hr at 02/06/21 1648 Infusion Verify at 02/06/21 1648    Allergies  Allergen Reactions   Wellbutrin Xl [Bupropion] Hives    ROS Review of Systems  Constitutional:  Negative for fever. Chills: shaking. Skin:  Positive for color change. Negative for wound.     Objective:    Physical Exam Vitals reviewed.  Constitutional:      Appearance: Normal appearance. She is not ill-appearing.  Cardiovascular:     Rate and Rhythm: Normal  rate.  Pulmonary:     Effort: Pulmonary effort is normal.  Skin:    General: Skin is warm and dry.     Comments: Skin overlying left breast normal in appearance. No discoloration noted. No wounds. No swelling.   Neurological:     Mental Status: She is alert and oriented to person, place, and time.     BP 127/77    Pulse 85    Temp 98.7 F (37.1 C) (Oral)    Ht 5\' 3"  (1.6 m)    Wt 159 lb (72.1 kg)    LMP 01/25/2014 (  Approximate)    SpO2 97%    BMI 28.17 kg/m  Wt Readings from Last 3 Encounters:  06/04/21 159 lb (72.1 kg)  05/29/21 157 lb 6 oz (71.4 kg)  04/30/21 160 lb (72.6 kg)     Health Maintenance Due  Topic Date Due   COVID-19 Vaccine (1) Never done   HIV Screening  Never done   Hepatitis C Screening  Never done   TETANUS/TDAP  Never done   Zoster Vaccines- Shingrix (1 of 2) Never done   COLONOSCOPY (Pts 45-59yrs Insurance coverage will need to be confirmed)  Never done   PAP SMEAR-Modifier  09/06/2020   INFLUENZA VACCINE  11/25/2020    There are no preventive care reminders to display for this patient.  Lab Results  Component Value Date   TSH 2.880 10/24/2019   Lab Results  Component Value Date   WBC 10.2 05/29/2021   HGB 11.8 (L) 05/29/2021   HCT 35.2 (L) 05/29/2021   MCV 91.0 05/29/2021   PLT 338 05/29/2021   Lab Results  Component Value Date   NA 141 05/29/2021   K 3.7 05/29/2021   CO2 29 05/29/2021   GLUCOSE 87 05/29/2021   BUN 23 (H) 05/29/2021   CREATININE 0.75 05/29/2021   BILITOT 0.2 (L) 05/29/2021   ALKPHOS 44 05/29/2021   AST 17 05/29/2021   ALT 9 05/29/2021   PROT 7.1 05/29/2021   ALBUMIN 4.1 05/29/2021   CALCIUM 9.1 05/29/2021   ANIONGAP 6 05/29/2021   Lab Results  Component Value Date   CHOL 252 (H) 10/24/2019   Lab Results  Component Value Date   HDL 66 10/24/2019   Lab Results  Component Value Date   LDLCALC 144 (H) 10/24/2019   Lab Results  Component Value Date   TRIG 235 (H) 10/24/2019   Lab Results  Component  Value Date   CHOLHDL 3.8 10/24/2019   Lab Results  Component Value Date   HGBA1C 5.3 10/24/2019      Assessment & Plan:   Problem List Items Addressed This Visit       Unprioritized   Cellulitis of left breast - Primary    Recurrent non-purulent cellulitis to the left breast/axilla. Fortunately she had on demand antibiotics and started taking this right away. Her cellulitis appears resolved and she feels much better. Will have her to finish out her last day of cefadroxil and I will give her a refill to have on demand.  Possible measures that could help reducing recurrences could be avoiding shaving axilla, keeping skin overlying breasts from getting dry, well supportive bra that covers well and is without under wire. She has a mammogram coming up for surveillance following cancer treatment - hopefully that does not injury breast tissue.       Relevant Medications   cefadroxil (DURICEF) 500 MG capsule    Meds ordered this encounter  Medications   cefadroxil (DURICEF) 500 MG capsule    Sig: Take 1 capsule (500 mg total) by mouth 2 (two) times daily for 7 days.    Dispense:  14 capsule    Refill:  1    Order Specific Question:   Supervising Provider    Answer:   Thayer Headings [[id number]]    Follow-up: PRN follow up     Janene Madeira, MSN, NP-C [facility] [email or web address] Pager: [phone] Office: [phone] [facility] Main Line: [facility] Welcome

## 2021-06-04 NOTE — Assessment & Plan Note (Addendum)
Recurrent non-purulent cellulitis to the left breast/axilla. Fortunately she had on demand antibiotics and started taking this right away. Her cellulitis appears resolved and she feels much better. Will have her to finish out her last day of cefadroxil and I will give her a refill to have on demand.  Possible measures that could help reducing recurrences could be avoiding shaving axilla, keeping skin overlying breasts from getting dry, well supportive bra that covers well and is without under wire. She has a mammogram coming up for surveillance following cancer treatment - hopefully that does not injury breast tissue.

## 2021-06-09 ENCOUNTER — Other Ambulatory Visit (HOSPITAL_BASED_OUTPATIENT_CLINIC_OR_DEPARTMENT_OTHER): Payer: Self-pay

## 2021-06-12 ENCOUNTER — Other Ambulatory Visit (HOSPITAL_BASED_OUTPATIENT_CLINIC_OR_DEPARTMENT_OTHER): Payer: Self-pay

## 2021-06-12 ENCOUNTER — Encounter: Payer: Self-pay | Admitting: Hematology and Oncology

## 2021-06-12 ENCOUNTER — Other Ambulatory Visit: Payer: Self-pay | Admitting: Adult Health

## 2021-06-12 ENCOUNTER — Other Ambulatory Visit: Payer: Self-pay | Admitting: Oncology

## 2021-06-12 DIAGNOSIS — F411 Generalized anxiety disorder: Secondary | ICD-10-CM

## 2021-06-12 MED ORDER — TAMOXIFEN CITRATE 20 MG PO TABS
ORAL_TABLET | Freq: Every day | ORAL | 3 refills | Status: DC
  Filled 2021-06-12: qty 90, 90d supply, fill #0
  Filled 2021-09-09: qty 90, 90d supply, fill #1
  Filled 2021-12-08: qty 60, 60d supply, fill #2
  Filled 2022-02-10: qty 90, 90d supply, fill #3

## 2021-06-12 MED ORDER — SERTRALINE HCL 50 MG PO TABS
ORAL_TABLET | Freq: Every day | ORAL | 1 refills | Status: DC
  Filled 2021-06-12: qty 90, 90d supply, fill #0

## 2021-06-23 ENCOUNTER — Other Ambulatory Visit (HOSPITAL_BASED_OUTPATIENT_CLINIC_OR_DEPARTMENT_OTHER): Payer: Self-pay

## 2021-07-01 ENCOUNTER — Other Ambulatory Visit (HOSPITAL_BASED_OUTPATIENT_CLINIC_OR_DEPARTMENT_OTHER): Payer: Self-pay

## 2021-07-01 ENCOUNTER — Encounter: Payer: Self-pay | Admitting: Family Medicine

## 2021-07-01 MED ORDER — CEFADROXIL 500 MG PO CAPS
500.0000 mg | ORAL_CAPSULE | Freq: Two times a day (BID) | ORAL | 0 refills | Status: AC
  Filled 2021-07-01: qty 4, 2d supply, fill #0

## 2021-07-03 ENCOUNTER — Encounter (HOSPITAL_BASED_OUTPATIENT_CLINIC_OR_DEPARTMENT_OTHER): Payer: Self-pay

## 2021-07-03 ENCOUNTER — Other Ambulatory Visit (HOSPITAL_BASED_OUTPATIENT_CLINIC_OR_DEPARTMENT_OTHER): Payer: Self-pay

## 2021-07-07 NOTE — Progress Notes (Unsigned)
[facility] Phone: [phone] Subjective:    I'm seeing this patient by the request  of:  [name], MD  CC:   [id number]  05/27/2021 Fracture seems to be well-healed at this time.  Still has difficulty even with going up stairs.  Given injection secondary to the continuous effusion noted.  Hopefully patient will respond well.  Discussed with patient that if continuing to have discomfort I do want to consider the possibility of advanced imaging.  Patient's x-rays were unremarkable.  Follow-up with me again in 3 to 6 weeks.  Updated 07/08/2021 Stephanie [name] is a 56 y.o. female coming in with complaint of right ankle fx.       Past Medical History:  Diagnosis Date   Anxiety    Breast mass, left    Cancer ([place])    Complication of anesthesia    felt lethargic x 4 days following last surgery in 2017, but 2018 did okay   Depression    Headache    migraines   History of radiation therapy 03/13/19- 04/11/19   Left Breast 16 fraction of 2. 66 Gy each to total 42.56 Gy. Left breast boost 4 fractions of 2 Gy to total 8 Gy.    Osteopenia    Past Surgical History:  Procedure Laterality Date   BREAST BIOPSY Left 07/10/2015   high risk stereo   BREAST EXCISIONAL BIOPSY Left 08/15/2015   ADH   BREAST LUMPECTOMY WITH RADIOACTIVE SEED LOCALIZATION Left 08/15/2015   Procedure:  LEFT BREAST LUMPECTOMY WITH RADIOACTIVE SEED LOCALIZATION;  Surgeon: Erroll Luna, MD;  Location: [facility];  Service: General;  Laterality: Left;   BREAST LUMPECTOMY WITH RADIOACTIVE SEED LOCALIZATION Left 02/09/2019   Procedure: LEFT BREAST LUMPECTOMY WITH RADIOACTIVE SEED LOCALIZATION;  Surgeon: [name], MD;  Location: [facility];  Service: General;  Laterality: Left;   EXCISION OF BREAST BIOPSY Left 12/30/2016   Benign ALH sclerosing lesion   RADIOACTIVE SEED GUIDED EXCISIONAL BREAST BIOPSY Left 12/30/2016    Procedure: RADIOACTIVE SEED GUIDED EXCISIONAL LEFT BREAST BIOPSY;  Surgeon: [name], MD;  Location: [facility];  Service: General;  Laterality: Left;   RADIOACTIVE SEED GUIDED EXCISIONAL BREAST BIOPSY Left 02/09/2019   Procedure: RADIOACTIVE SEED GUIDED EXCISIONAL LEFT  BREAST BIOPSY;  Surgeon: [name], MD;  Location: [facility];  Service: General;  Laterality: Left;   TONSILLECTOMY  Age 86   TRIGGER FINGER RELEASE  2010   Right Thumb   Social History   Socioeconomic History   Marital status: Married    Spouse name: Not on file   Number of children: Not on file   Years of education: Not on file   Highest education level: Not on file  Occupational History   Occupation: RN    Employer: [employer]  Tobacco Use   Smoking status: Never   Smokeless tobacco: Never  Vaping Use   Vaping Use: Never used  Substance and Sexual Activity   Alcohol use: No    Comment: rare use   Drug use: No   Sexual activity: Yes    Partners: Male    Birth control/protection: Post-menopausal  Other Topics Concern   Not on file  Social History Narrative   04/05/12 AM he was born and grew up in [place]. She has 3 older brothers. She never suffered any abuse. Her father died when she was 53 years of age. 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 She affiliates as Psychologist, forensic. She denies any legal involvement. She enjoys hiking and reading. She reports her social support consists of friends from college, and a friend from church. 04/05/2012   Social Determinants of Health   Financial Resource Strain: Not on file  Food Insecurity: Not on file  Transportation Needs: Not on file  Physical Activity: Not on file  Stress: Not on file  Social Connections: Not on file   Allergies  Allergen Reactions    Wellbutrin Xl [Bupropion] Hives   Family History  Problem Relation Age of Onset   Depression Brother    Atrial fibrillation Brother    Hypertension Mother    Osteoporosis Mother    ADD / ADHD Son    ADD / ADHD Daughter    Diabetes Maternal Grandmother    Diabetes Maternal Grandfather    Diabetes Paternal Grandmother    Diabetes Paternal Grandfather    Parkinson's disease Maternal Aunt     Current Outpatient Medications (Endocrine & Metabolic):    alendronate (FOSAMAX) 70 MG tablet, TAKE 1 TABLET BY MOUTH ONCE A WEEK. TAKE WITH A FULL GLASS OF WATER ON AN EMPTY STOMACH   Current Outpatient Medications (Cardiovascular):    cholestyramine (QUESTRAN) 4 g packet, Take 1 packet (4 g total) by mouth 3 (three) times daily with meals.         Current Outpatient Medications (Other):    Biotin w/ Vitamins C & E (HAIR/SKIN/NAILS PO), Take 1 tablet by mouth daily.    Calcium Carb-Cholecalciferol (CALCIUM + D3 PO), Take 1 tablet by mouth daily.    calcium carbonate (OS-CAL) 1250 (500 Ca) MG chewable tablet, Chew 1 tablet by mouth daily.   estradiol (ESTRING) 2 MG vaginal ring, Place 2 mg vaginally every 3 (three) months. follow package directions   gabapentin (NEURONTIN) 300 MG capsule, TAKE 1 CAPSULE BY MOUTH AT BEDTIME   sertraline (ZOLOFT) 50 MG tablet, Take 1 tablet (50 mg total) by mouth daily.   sertraline (ZOLOFT) 50 MG tablet, TAKE 1 TABLET BY MOUTH ONCE DAILY   tamoxifen (NOLVADEX) 20 MG tablet, Take 20 mg by mouth daily.   tamoxifen (NOLVADEX) 20 MG tablet, TAKE 1 TABLET BY MOUTH ONCE DAILY   TURMERIC PO, Take by mouth.   Facility-Administered Medications Ordered in Other Visits (Other):    0.9 %  sodium chloride infusion No current facility-administered medications for this visit.   Reviewed prior external information including notes and imaging from  primary care provider As well as notes that were available from care everywhere and other healthcare systems.  Past  medical history, social, surgical and family history all reviewed in electronic medical record.  No pertanent information unless stated regarding to the chief complaint.   Review of Systems:  No headache, visual changes, nausea, vomiting, diarrhea, constipation, dizziness, abdominal pain, skin rash, fevers, chills, night sweats, weight loss, swollen lymph nodes, body aches, joint swelling, chest pain, shortness of breath, mood changes. POSITIVE muscle aches  Objective  Last menstrual period 01/25/2014.   General: No apparent distress alert and oriented x3 mood and affect normal, dressed appropriately.  HEENT: Pupils equal, extraocular movements intact  Respiratory: Patient's speak in full sentences and does not appear short of breath  Cardiovascular: No lower extremity edema, non tender, no erythema  Gait  normal with good balance and coordination.  MSK:  Non tender with full range of motion and good stability and symmetric strength and tone of shoulders, elbows, wrist, hip, knee and ankles bilaterally.     Impression and Recommendations:     The above documentation has been reviewed and is accurate and complete Delsa Sale

## 2021-07-08 ENCOUNTER — Ambulatory Visit: Payer: Self-pay

## 2021-07-08 ENCOUNTER — Ambulatory Visit: Payer: 59 | Admitting: Family Medicine

## 2021-07-08 ENCOUNTER — Other Ambulatory Visit: Payer: Self-pay

## 2021-07-08 ENCOUNTER — Encounter: Payer: Self-pay | Admitting: Family Medicine

## 2021-07-08 VITALS — BP 122/88 | HR 72 | Ht 63.0 in | Wt 157.0 lb

## 2021-07-08 DIAGNOSIS — M9902 Segmental and somatic dysfunction of thoracic region: Secondary | ICD-10-CM

## 2021-07-08 DIAGNOSIS — M25572 Pain in left ankle and joints of left foot: Secondary | ICD-10-CM

## 2021-07-08 DIAGNOSIS — M9904 Segmental and somatic dysfunction of sacral region: Secondary | ICD-10-CM | POA: Diagnosis not present

## 2021-07-08 DIAGNOSIS — M810 Age-related osteoporosis without current pathological fracture: Secondary | ICD-10-CM

## 2021-07-08 DIAGNOSIS — M9901 Segmental and somatic dysfunction of cervical region: Secondary | ICD-10-CM

## 2021-07-08 DIAGNOSIS — S8264XD Nondisplaced fracture of lateral malleolus of right fibula, subsequent encounter for closed fracture with routine healing: Secondary | ICD-10-CM | POA: Diagnosis not present

## 2021-07-08 DIAGNOSIS — M9903 Segmental and somatic dysfunction of lumbar region: Secondary | ICD-10-CM

## 2021-07-08 NOTE — Patient Instructions (Signed)
Body helix x active ?See me in 6 weeks ?

## 2021-07-10 NOTE — Assessment & Plan Note (Signed)
Fully healed at this time.  No significant changes need to be made.  Patient can increase activity as tolerated.  Worsening pain though will need to consider the possibility of an MRI secondary to the effusion of the ankle but I think patient will be fine. ?

## 2021-07-10 NOTE — Assessment & Plan Note (Signed)
Patient does have known osteoporosis as well as does have some poor posture.  Attempted osteopathic manipulation.  Did have improvement noted today.  Discussed home exercises including gabapentin.  Follow-up with me again in 6 to 8 weeks. ?

## 2021-07-10 NOTE — Assessment & Plan Note (Signed)

## 2021-07-16 ENCOUNTER — Encounter: Payer: Self-pay | Admitting: Hematology and Oncology

## 2021-07-18 ENCOUNTER — Encounter: Payer: Self-pay | Admitting: Family Medicine

## 2021-07-24 ENCOUNTER — Encounter: Payer: Self-pay | Admitting: Hematology and Oncology

## 2021-07-28 ENCOUNTER — Other Ambulatory Visit (HOSPITAL_BASED_OUTPATIENT_CLINIC_OR_DEPARTMENT_OTHER): Payer: Self-pay

## 2021-07-28 ENCOUNTER — Encounter: Payer: Self-pay | Admitting: Adult Health

## 2021-07-28 ENCOUNTER — Inpatient Hospital Stay: Payer: 59 | Attending: Adult Health | Admitting: Adult Health

## 2021-07-28 ENCOUNTER — Other Ambulatory Visit: Payer: Self-pay

## 2021-07-28 VITALS — BP 133/65 | HR 89 | Temp 97.7°F | Resp 18 | Ht 63.0 in | Wt 155.8 lb

## 2021-07-28 DIAGNOSIS — C50412 Malignant neoplasm of upper-outer quadrant of left female breast: Secondary | ICD-10-CM | POA: Diagnosis not present

## 2021-07-28 DIAGNOSIS — Z170 Estrogen receptor positive status [ER+]: Secondary | ICD-10-CM | POA: Insufficient documentation

## 2021-07-28 MED ORDER — VENLAFAXINE HCL ER 37.5 MG PO CP24
37.5000 mg | ORAL_CAPSULE | Freq: Every day | ORAL | 1 refills | Status: DC
  Filled 2021-07-28 – 2021-08-21 (×2): qty 90, 90d supply, fill #0

## 2021-07-28 NOTE — Progress Notes (Addendum)
[facility]: ?  ? ?[name], MD ?[place] [address] ? ? ?DIAGNOSIS:  Cancer Staging  ?Malignant neoplasm of upper-outer quadrant of left breast in female, estrogen receptor positive ([place]) ?Staging form: Breast, AJCC 8th Edition ?- Clinical stage from 01/06/2019: Stage 0 (cTis (DCIS), cN0, cM0, ER+, PR+) - Signed by Gardenia Phlegm, NP on 01/18/2019 ?Stage prefix: Initial diagnosis ?- Pathologic stage from 02/09/2019: Stage 0 (pTis (DCIS), pN0, cM0, ER+, PR+) - Signed by Gardenia Phlegm, NP on 03/01/2019 ?Stage prefix: Initial diagnosis ? ? ?SUMMARY OF ONCOLOGIC HISTORY: ?56 y.o. [place] woman status post left breast biopsy 01/06/2019 ductal carcinoma in situ, grade 3, estrogen and progesterone receptor positive ?            (a) biopsy of a second area of calcifications in the left breast on 01/17/2019 - ?  ?(1) status post left lumpectomy 02/09/2019 2.4 cm ductal carcinoma in situ, grade 2, with negative margins ?  ?(2) adjuvant radiation 03/13/2019 - 04/11/2019 ?            (a) left breast / 42.56 Gy in 16 fractions ?            (b) boost / 8 Gy in 4 fractions ?  ?(3) tamoxifen started 04/28/2019 ?  ?(4) genetics testing 01/19/2019 through the [facility] Breast Cancer STAT Panel + Common Hereditary Cancers Panel found no deleterious mutations in ATM, BRCA1, BRCA2, CDH1, CHEK2, PALB2, PTEN, STK11 and TP53 // APC, ATM, AXIN2, BARD1, BMPR1A, BRCA1, BRCA2, BRIP1, CDH1, CDKN2A (p14ARF), CDKN2A (p16INK4a), CKD4, CHEK2, CTNNA1, DICER1, EPCAM (Deletion/duplication testing only), GREM1 (promoter region deletion/duplication testing only), KIT, MEN1, MLH1, MSH2, MSH3, MSH6, MUTYH, NBN, NF1, NHTL1, PALB2, PDGFRA, PMS2, POLD1, POLE, PTEN, RAD50, RAD51C, RAD51D, SDHB, SDHC, SDHD, SMAD4, SMARCA4. STK11, TP53, TSC1, TSC2, and VHL.  The following genes were evaluated for sequence changes only: SDHA and HOXB13 c.251G>A variant only. ?  ?(5) left breast  cellulitis October 2022, requiring intravenous vancomycin, completed November 2022. ? ?CURRENT THERAPY: Tamoxifen ? ?INTERVAL HISTORY: ?Ernst Bowler 56 y.o. female returns for evaluation of right nipple erythema that lasts for 1-2 days then resolves. It started on Thursday, however today is resolved.  She is not experiencing this anymore.   ? ?[name] notes that she is more depressed.  She says that she has this feeling of apathy and does not enjoy what she used to enjoy.  She has been on Zoloft in had tried to increase it at 1 point however notes that she is more brain fog when she does this.  She does not like the current medication regimen that she is on and would like to change this. ? ? ? ? ?Patient Active Problem List  ? Diagnosis Date Noted  ? Somatic dysfunction of spine, lumbar 07/10/2021  ? Fracture of right ankle, lateral malleolus 04/02/2021  ? Ductal carcinoma in situ (DCIS) of left breast 03/11/2021  ? Cellulitis of left breast 02/13/2021  ? Cellulitis of other sites 02/10/2021  ? Postmenopausal bleeding 12/04/2019  ? Lichen sclerosus et atrophicus 04/18/2019  ? Genetic testing 01/20/2019  ? Malignant neoplasm of upper-outer quadrant of left breast in female, estrogen receptor positive ([place]) 01/16/2019  ? TMJ pain dysfunction syndrome 05/26/2018  ? Achilles tendinosis of left lower extremity 11/30/2016  ? Depression, major 01/10/2013  ? Generalized anxiety disorder 01/10/2013  ? Osteoporosis of lumbar spine 04/05/2012  ? Attention deficit disorder without mention of hyperactivity 04/05/2012  ?  Tendonitis of wrist, right 02/05/2011  ? ? ?is allergic to wellbutrin xl [bupropion]. ? ?MEDICAL HISTORY: ?Past Medical History:  ?Diagnosis Date  ? Anxiety   ? Breast mass, left   ? Cancer [facility])   ? Complication of anesthesia   ? felt lethargic x 4 days following last surgery in 2017, but 2018 did okay  ? Depression   ? Headache   ? migraines  ? History of radiation therapy 03/13/19- 04/11/19  ? Left Breast 16  fraction of 2. 66 Gy each to total 42.56 Gy. Left breast boost 4 fractions of 2 Gy to total 8 Gy.   ? Osteopenia   ? ? ?SURGICAL HISTORY: ?Past Surgical History:  ?Procedure Laterality Date  ? BREAST BIOPSY Left 07/10/2015  ? high risk stereo  ? BREAST EXCISIONAL BIOPSY Left 08/15/2015  ? ADH  ? BREAST LUMPECTOMY WITH RADIOACTIVE SEED LOCALIZATION Left 08/15/2015  ? Procedure:  LEFT BREAST LUMPECTOMY WITH RADIOACTIVE SEED LOCALIZATION;  Surgeon: Erroll Luna, MD;  Location: [place];  Service: General;  Laterality: Left;  ? BREAST LUMPECTOMY WITH RADIOACTIVE SEED LOCALIZATION Left 02/09/2019  ? Procedure: LEFT BREAST LUMPECTOMY WITH RADIOACTIVE SEED LOCALIZATION;  Surgeon: [name], MD;  Location: [place];  Service: General;  Laterality: Left;  ? EXCISION OF BREAST BIOPSY Left 12/30/2016  ? Benign ALH sclerosing lesion  ? RADIOACTIVE SEED GUIDED EXCISIONAL BREAST BIOPSY Left 12/30/2016  ? Procedure: RADIOACTIVE SEED GUIDED EXCISIONAL LEFT BREAST BIOPSY;  Surgeon: [name], MD;  Location: [place];  Service: General;  Laterality: Left;  ? RADIOACTIVE SEED GUIDED EXCISIONAL BREAST BIOPSY Left 02/09/2019  ? Procedure: RADIOACTIVE SEED GUIDED EXCISIONAL LEFT  BREAST BIOPSY;  Surgeon: [name], MD;  Location: [place];  Service: General;  Laterality: Left;  ? TONSILLECTOMY  Age 50  ? TRIGGER FINGER RELEASE  2010  ? Right Thumb  ? ? ?SOCIAL HISTORY: ?Social History  ? ?Socioeconomic History  ? Marital status: Married  ?  Spouse name: Not on file  ? Number of children: Not on file  ? Years of education: Not on file  ? Highest education level: Not on file  ?Occupational History  ? Occupation: Therapist, sports  ?  Employer: [employer]  ?Tobacco Use  ? Smoking status: Never  ? Smokeless tobacco: Never  ?Vaping Use  ? Vaping Use: Never used  ?Substance and Sexual Activity  ? Alcohol use: No  ?  Comment: rare use  ? Drug use: No  ? Sexual activity: Yes  ?  Partners: Male  ?  Birth  control/protection: Post-menopausal  ?Other Topics Concern  ? Not on file  ?Social History Narrative  ? 04/05/12 AM he was born and grew up in [facility]. She has 3 older brothers. She never suffered any abuse. Her father died when she was 89 years of age. She completed her bachelor of science in nursing in 1989, and has been working as an Therapist, sports ?[employer] She has been married for 21 years, and she and her husband have a 52 year old son and a 58 year old daughter. She affiliates as Psychologist, forensic. She denies any legal involvement. She enjoys hiking and reading. She reports her social support consists of friends from college, and a friend from church. 04/05/2012  ? ?Social Determinants of Health  ? ?Financial Resource Strain: Not on file  ?Food Insecurity: Not on file  ?Transportation Needs: Not on file  ?Physical Activity: Not on file  ?Stress: Not on file  ?Social Connections: Not on  file  ?Intimate Partner Violence: Not on file  ? ? ?FAMILY HISTORY: ?Family History  ?Problem Relation Age of Onset  ? Depression Brother   ? Atrial fibrillation Brother   ? Hypertension Mother   ? Osteoporosis Mother   ? ADD / ADHD Son   ? ADD / ADHD Daughter   ? Diabetes Maternal Grandmother   ? Diabetes Maternal Grandfather   ? Diabetes Paternal Grandmother   ? Diabetes Paternal Grandfather   ? Parkinson's disease Maternal Aunt   ? ? ?Review of Systems  ?Constitutional:  Negative for appetite change, chills, fatigue, fever and unexpected weight change.  ?HENT:   Negative for hearing loss, lump/mass and trouble swallowing.   ?Eyes:  Negative for eye problems and icterus.  ?Respiratory:  Negative for chest tightness, cough and shortness of breath.   ?Cardiovascular:  Negative for chest pain, leg swelling and palpitations.  ?Gastrointestinal:  Negative for abdominal distention, abdominal pain, constipation, diarrhea, nausea and vomiting.  ?Endocrine: Negative for hot flashes.  ?Genitourinary:  Negative for difficulty urinating.    ?Musculoskeletal:  Negative for arthralgias.  ?Skin:  Negative for itching and rash.  ?Neurological:  Negative for dizziness, extremity weakness, headaches and numbness.  ?Hematological:  Negative for adenopathy. Does

## 2021-07-28 NOTE — Assessment & Plan Note (Addendum)
Stephanie [name] is here today for follow-up of her history of left breast DCIS she had some right breast changes and would like those evaluated.  However today these are resolved.  I let her know to take a picture of her breast and if it returns we will come up with a plan. ? ?I also changed her Zoloft and instructed her in a taper.  She will take 1/2 tablet for 4 to 7 days and then take 1/2 tablet every other day until she is able to stop in which point she will start taking Effexor 37.5 mg daily.  We discussed risks and benefits and she will follow-up with me in about 6 weeks to see how she is doing on the medication. ? ? ?

## 2021-07-29 ENCOUNTER — Telehealth: Payer: Self-pay | Admitting: Adult Health

## 2021-07-29 NOTE — Telephone Encounter (Signed)
Scheduled appointment per 4/3 los. Left message. ?

## 2021-08-08 ENCOUNTER — Other Ambulatory Visit (HOSPITAL_BASED_OUTPATIENT_CLINIC_OR_DEPARTMENT_OTHER): Payer: Self-pay

## 2021-08-13 ENCOUNTER — Encounter: Payer: Self-pay | Admitting: Adult Health

## 2021-08-18 ENCOUNTER — Other Ambulatory Visit: Payer: Self-pay

## 2021-08-18 ENCOUNTER — Telehealth: Payer: Self-pay

## 2021-08-18 ENCOUNTER — Other Ambulatory Visit: Payer: Self-pay | Admitting: Oncology

## 2021-08-18 ENCOUNTER — Other Ambulatory Visit (HOSPITAL_BASED_OUTPATIENT_CLINIC_OR_DEPARTMENT_OTHER): Payer: Self-pay

## 2021-08-18 NOTE — Progress Notes (Signed)
?Charlann Boxer D.O. ?[facility] ?[place] ?Phone: [phone] ?Subjective:   ?I, Stephanie [name], am serving as a scribe for Dr. Hulan Saas. ? ?This visit occurred during the SARS-CoV-2 public health emergency.  Safety protocols were in place, including screening questions prior to the visit, additional usage of staff PPE, and extensive cleaning of exam room while observing appropriate contact time as indicated for disinfecting solutions.  ? ? ?I'm seeing this patient by the request  of:  [name], MD ? ?CC: right ankle pain  ? ?[id number]  ?07/08/2021 ?OMT performed ? ?Patient does have known osteoporosis as well as does have some poor posture.  Attempted osteopathic manipulation.  Did have improvement noted today.  Discussed home exercises including gabapentin.  Follow-up with me again in 6 to 8 weeks. ? ?Fully healed at this time.  No significant changes need to be made.  Patient can increase activity as tolerated.  Worsening pain though will need to consider the possibility of an MRI secondary to the effusion of the ankle but I think patient will be fine. ? ?Updated 08/19/2021 ?Stephanie [name] is a 56 y.o. female coming in with complaint of ankle pain lower extremity significantly better initially and seems to be doing well after the lateral malleolus fracture.  Patient does not have an effusion of the joint.  Patient states that shoulder and neck pain have improved.  ? ?Ankle pain has resolved.  ? ?Would like exercises for SI joint that she feels mostly when getting out of bed at night.  ? ?Would also like to discuss options for compression socks during the summer.  ? ? ? ?  ? ?Past Medical History:  ?Diagnosis Date  ? Anxiety   ? Breast mass, left   ? Cancer [facility])   ? Complication of anesthesia   ? felt lethargic x 4 days following last surgery in 2017, but 2018 did okay  ? Depression   ? Headache   ? migraines  ? History of radiation therapy 03/13/19-  04/11/19  ? Left Breast 16 fraction of 2. 66 Gy each to total 42.56 Gy. Left breast boost 4 fractions of 2 Gy to total 8 Gy.   ? Osteopenia   ? ?Past Surgical History:  ?Procedure Laterality Date  ? BREAST BIOPSY Left 07/10/2015  ? high risk stereo  ? BREAST EXCISIONAL BIOPSY Left 08/15/2015  ? ADH  ? BREAST LUMPECTOMY WITH RADIOACTIVE SEED LOCALIZATION Left 08/15/2015  ? Procedure:  LEFT BREAST LUMPECTOMY WITH RADIOACTIVE SEED LOCALIZATION;  Surgeon: Erroll Luna, MD;  Location: [place];  Service: General;  Laterality: Left;  ? BREAST LUMPECTOMY WITH RADIOACTIVE SEED LOCALIZATION Left 02/09/2019  ? Procedure: LEFT BREAST LUMPECTOMY WITH RADIOACTIVE SEED LOCALIZATION;  Surgeon: [name], MD;  Location: [place];  Service: General;  Laterality: Left;  ? EXCISION OF BREAST BIOPSY Left 12/30/2016  ? Benign ALH sclerosing lesion  ? RADIOACTIVE SEED GUIDED EXCISIONAL BREAST BIOPSY Left 12/30/2016  ? Procedure: RADIOACTIVE SEED GUIDED EXCISIONAL LEFT BREAST BIOPSY;  Surgeon: [name], MD;  Location: [place];  Service: General;  Laterality: Left;  ? RADIOACTIVE SEED GUIDED EXCISIONAL BREAST BIOPSY Left 02/09/2019  ? Procedure: RADIOACTIVE SEED GUIDED EXCISIONAL LEFT  BREAST BIOPSY;  Surgeon: [name], MD;  Location: [place];  Service: General;  Laterality: Left;  ? TONSILLECTOMY  Age 75  ? TRIGGER FINGER RELEASE  2010  ? Right Thumb  ? ?Social History  ? ?Socioeconomic  History  ? Marital status: Married  ?  Spouse name: Not on file  ? Number of children: Not on file  ? Years of education: Not on file  ? Highest education level: Not on file  ?Occupational History  ? Occupation: Therapist, sports  ?  Employer: [employer]  ?Tobacco Use  ? Smoking status: Never  ? Smokeless tobacco: Never  ?Vaping Use  ? Vaping Use: Never used  ?Substance and Sexual Activity  ? Alcohol use: No  ?  Comment: rare use  ? Drug use: No  ? Sexual activity: Yes  ?  Partners: Male  ?  Birth  control/protection: Post-menopausal  ?Other Topics Concern  ? Not on file  ?Social History Narrative  ? 04/05/12 AM he was born and grew up in [facility] [place] [place]. She has 3 older brothers. She never suffered any abuse. Her father died when she was 68 years of age. She completed her bachelor of science in nursing in 1989, and has been working as an Therapist, sports ?[employer] She has been married for 21 years, and she and her husband have a 6 year old son and a 56 year old daughter. She affiliates as Psychologist, forensic. She denies any legal involvement. She enjoys hiking and reading. She reports her social support consists of friends from college, and a friend from church. 04/05/2012  ? ?Social Determinants of Health  ? ?Financial Resource Strain: Not on file  ?Food Insecurity: Not on file  ?Transportation Needs: Not on file  ?Physical Activity: Not on file  ?Stress: Not on file  ?Social Connections: Not on file  ? ?Allergies  ?Allergen Reactions  ? Wellbutrin Xl [Bupropion] Hives  ? ?Family History  ?Problem Relation Age of Onset  ? Depression Brother   ? Atrial fibrillation Brother   ? Hypertension Mother   ? Osteoporosis Mother   ? ADD / ADHD Son   ? ADD / ADHD Daughter   ? Diabetes Maternal Grandmother   ? Diabetes Maternal Grandfather   ? Diabetes Paternal Grandmother   ? Diabetes Paternal Grandfather   ? Parkinson's disease Maternal Aunt   ? ? ?Current Outpatient Medications (Endocrine & Metabolic):  ?  alendronate (FOSAMAX) 70 MG tablet, TAKE 1 TABLET BY MOUTH ONCE A WEEK. TAKE WITH A FULL GLASS OF WATER ON AN EMPTY STOMACH ? ? ?Current Outpatient Medications (Cardiovascular):  ?  cholestyramine (QUESTRAN) 4 g packet, Take 1 packet (4 g total) by mouth 3 (three) times daily with meals. ? ? ? ? ? ? ? ? ?Current Outpatient Medications (Other):  ?  Biotin w/ Vitamins C & E (HAIR/SKIN/NAILS PO), Take 1 tablet by mouth daily.  ?  Calcium Carb-Cholecalciferol (CALCIUM + D3 PO), Take 1 tablet by mouth daily.  ?  calcium carbonate (OS-CAL) 1250  (500 Ca) MG chewable tablet, Chew 1 tablet by mouth daily. ?  estradiol (ESTRING) 2 MG vaginal ring, Place 2 mg vaginally every 3 (three) months. follow package directions ?  gabapentin (NEURONTIN) 300 MG capsule, TAKE 1 CAPSULE BY MOUTH AT BEDTIME ?  tamoxifen (NOLVADEX) 20 MG tablet, TAKE 1 TABLET BY MOUTH ONCE DAILY ?  venlafaxine XR (EFFEXOR-XR) 37.5 MG 24 hr capsule, Take 1 capsule (37.5 mg total) by mouth daily with breakfast. ?  TURMERIC PO, Take by mouth. ? ? ?Facility-Administered Medications Ordered in Other Visits (Other):  ?  0.9 %  sodium chloride infusion ?No current facility-administered medications for this visit. ? ? ?Reviewed prior external information including notes and imaging from  ?primary care provider ?As  well as notes that were available from care everywhere and other healthcare systems. ? ?Past medical history, social, surgical and family history all reviewed in electronic medical record.  No pertanent information unless stated regarding to the chief complaint.  ? ?Review of Systems: ? No headache, visual changes, nausea, vomiting, diarrhea, constipation, dizziness, abdominal pain, skin rash, fevers, chills, night sweats, weight loss, swollen lymph nodes, body aches, joint swelling, chest pain, shortness of breath, mood changes. POSITIVE muscle aches ? ?Objective  ?Blood pressure 110/70, pulse 80, height '5\' 3"'$  (1.6 m), weight 159 lb (72.1 kg), last menstrual period 01/25/2014, SpO2 99 %. ?  ?General: No apparent distress alert and oriented x3 mood and affect normal, dressed appropriately.  ?HEENT: Pupils equal, extraocular movements intact  ?Respiratory: Patient's speak in full sentences and does not appear short of breath  ?Cardiovascular: No lower extremity edema, non tender, no erythema  ?Gait normal with good balance and coordination.  ?MSK: right ankle exam shows no significant swelling at the moment.  Full range of motion. ?Back exam does show some tightness over the sacroiliac  joint.  Seems to be positive FABER test on the right compared to the left.  Does have tightness of the hip flexors bilaterally. ? ? ?Osteopathic findings ?C2 flexed rotated and side bent right ?T6 extended rotated and sid

## 2021-08-18 NOTE — Telephone Encounter (Signed)
Pt called on call nurse line over the weekend and states since tapering off Zoloft she felt symptoms of vertigo. She started taking 25 mg Sunday and symptoms stopped. Educated pt on tapering for Zoloft and advised if she has any further sx of vertigo she can take antivert of sudafed. Pt verbalized thanks and understanding.  ?

## 2021-08-19 ENCOUNTER — Other Ambulatory Visit: Payer: Self-pay | Admitting: *Deleted

## 2021-08-19 ENCOUNTER — Ambulatory Visit: Payer: 59 | Admitting: Family Medicine

## 2021-08-19 ENCOUNTER — Other Ambulatory Visit (HOSPITAL_BASED_OUTPATIENT_CLINIC_OR_DEPARTMENT_OTHER): Payer: Self-pay

## 2021-08-19 ENCOUNTER — Ambulatory Visit: Payer: Self-pay

## 2021-08-19 VITALS — BP 110/70 | HR 80 | Ht 63.0 in | Wt 159.0 lb

## 2021-08-19 DIAGNOSIS — M9903 Segmental and somatic dysfunction of lumbar region: Secondary | ICD-10-CM | POA: Diagnosis not present

## 2021-08-19 DIAGNOSIS — M9901 Segmental and somatic dysfunction of cervical region: Secondary | ICD-10-CM

## 2021-08-19 DIAGNOSIS — M9904 Segmental and somatic dysfunction of sacral region: Secondary | ICD-10-CM | POA: Diagnosis not present

## 2021-08-19 DIAGNOSIS — M25572 Pain in left ankle and joints of left foot: Secondary | ICD-10-CM | POA: Diagnosis not present

## 2021-08-19 DIAGNOSIS — M533 Sacrococcygeal disorders, not elsewhere classified: Secondary | ICD-10-CM | POA: Diagnosis not present

## 2021-08-19 DIAGNOSIS — M9902 Segmental and somatic dysfunction of thoracic region: Secondary | ICD-10-CM | POA: Diagnosis not present

## 2021-08-19 DIAGNOSIS — M9908 Segmental and somatic dysfunction of rib cage: Secondary | ICD-10-CM | POA: Diagnosis not present

## 2021-08-19 MED ORDER — GABAPENTIN 300 MG PO CAPS
ORAL_CAPSULE | Freq: Every day | ORAL | 4 refills | Status: DC
  Filled 2021-08-19: qty 90, 90d supply, fill #0
  Filled 2021-11-11: qty 90, 90d supply, fill #1
  Filled 2022-02-10: qty 90, 90d supply, fill #2
  Filled 2022-05-04: qty 90, 90d supply, fill #3
  Filled 2022-08-04: qty 90, 90d supply, fill #4

## 2021-08-19 NOTE — Patient Instructions (Signed)
SI joint exercises ?See if we can find compression socks ?See me in 3 months ?

## 2021-08-19 NOTE — Assessment & Plan Note (Signed)
Chronic discomfort over the sacroiliac joint.  Responded extremely well though to osteopathic manipulation.  Discussed with abductor strengthening as well as STRETCHING.  PATIENT WORK WITH ATHLETIC TRAINER TO LEARN THESE IN GREATER DETAIL.  DISCUSSED WHICH ACTIVITIES TO DO WHICH ONES TO AVOID, INCREASE ACTIVITY OTHERWISE SLOWLY FOLLOW-UP WITH ME AGAIN IN 6 TO 8 WEEKS. ?

## 2021-08-20 ENCOUNTER — Encounter: Payer: Self-pay | Admitting: Family Medicine

## 2021-08-21 ENCOUNTER — Other Ambulatory Visit (HOSPITAL_BASED_OUTPATIENT_CLINIC_OR_DEPARTMENT_OTHER): Payer: Self-pay

## 2021-09-09 ENCOUNTER — Other Ambulatory Visit (HOSPITAL_BASED_OUTPATIENT_CLINIC_OR_DEPARTMENT_OTHER): Payer: Self-pay

## 2021-09-09 ENCOUNTER — Encounter: Payer: Self-pay | Admitting: Adult Health

## 2021-09-09 ENCOUNTER — Inpatient Hospital Stay: Payer: 59 | Attending: Adult Health | Admitting: Adult Health

## 2021-09-09 DIAGNOSIS — D0512 Intraductal carcinoma in situ of left breast: Secondary | ICD-10-CM

## 2021-09-09 DIAGNOSIS — Z170 Estrogen receptor positive status [ER+]: Secondary | ICD-10-CM

## 2021-09-09 DIAGNOSIS — C50412 Malignant neoplasm of upper-outer quadrant of left female breast: Secondary | ICD-10-CM | POA: Diagnosis not present

## 2021-09-09 MED ORDER — ZOLPIDEM TARTRATE 5 MG PO TABS
5.0000 mg | ORAL_TABLET | Freq: Every evening | ORAL | 0 refills | Status: DC | PRN
  Filled 2021-09-09: qty 2, 2d supply, fill #0

## 2021-09-09 NOTE — Progress Notes (Signed)
[facility] Follow up:    Stephanie Noon, MD [place] [id number]  I connected with Stephanie [name] 09/14/21 at 12:15 PM EDT by my chart video and verified that I am speaking with the correct person using two identifiers.  I discussed the limitations, risks, security and privacy concerns of performing an evaluation and management service by telephone and the availability of in person appointments.  I also discussed with the patient that there may be a patient responsible charge related to this service. The patient expressed understanding and agreed to proceed.  Patient location: in [place], [place] in private office Provider location: [facility] in [place], [place]  DIAGNOSIS:  Cancer Staging  Malignant neoplasm of upper-outer quadrant of left breast in female, estrogen receptor positive ([place]) Staging form: Breast, AJCC 8th Edition - Clinical stage from 01/06/2019: Stage 0 (cTis (DCIS), cN0, cM0, ER+, PR+) - Signed by Gardenia Phlegm, NP on 01/18/2019 Stage prefix: Initial diagnosis - Pathologic stage from 02/09/2019: Stage 0 (pTis (DCIS), pN0, cM0, ER+, PR+) - Signed by Gardenia Phlegm, NP on 03/01/2019 Stage prefix: Initial diagnosis   SUMMARY OF ONCOLOGIC HISTORY: Oncology History  Malignant neoplasm of upper-outer quadrant of left breast in female, estrogen receptor positive ([place])  01/06/2019 Cancer Staging   Staging form: Breast, AJCC 8th Edition - Clinical stage from 01/06/2019: Stage 0 (cTis (DCIS), cN0, cM0, ER+, PR+) - Signed by Gardenia Phlegm, NP on 01/18/2019    01/16/2019 Initial Diagnosis   Malignant neoplasm of upper-outer quadrant of left breast in female, estrogen receptor positive ([name])    01/19/2019 Genetic Testing   Negative genetic testing. No pathogenic variants identified on the [facility] Breast Cancer STAT Panel + Common Hereditary Cancers Panel. The STAT Breast cancer panel offered by [facility] includes  sequencing and rearrangement analysis for the following 9 genes:  ATM, BRCA1, BRCA2, CDH1, CHEK2, PALB2, PTEN, STK11 and TP53.  The Common Hereditary Cancers Panel offered by [facility] includes sequencing and/or deletion duplication testing of the following 47 genes: APC, ATM, AXIN2, BARD1, BMPR1A, BRCA1, BRCA2, BRIP1, CDH1, CDKN2A (p14ARF), CDKN2A (p16INK4a), CKD4, CHEK2, CTNNA1, DICER1, EPCAM (Deletion/duplication testing only), GREM1 (promoter region deletion/duplication testing only), KIT, MEN1, MLH1, MSH2, MSH3, MSH6, MUTYH, NBN, NF1, NHTL1, PALB2, PDGFRA, PMS2, POLD1, POLE, PTEN, RAD50, RAD51C, RAD51D, SDHB, SDHC, SDHD, SMAD4, SMARCA4. STK11, TP53, TSC1, TSC2, and VHL.  The following genes were evaluated for sequence changes only: SDHA and HOXB13 c.251G>A variant only. The report date is 01/19/2019.    02/09/2019 Cancer Staging   Staging form: Breast, AJCC 8th Edition - Pathologic stage from 02/09/2019: Stage 0 (pTis (DCIS), pN0, cM0, ER+, PR+) - Signed by Gardenia Phlegm, NP on 03/01/2019      CURRENT THERAPY: tamoxifen  INTERVAL HISTORY: Stephanie [name] 56 y.o. female returns for f/u after tapering off of zoloft and starting effexor.  She is tolerating the medication change well and is feeling better with near resolution of her apathy. She had some mild nausea, however that is resolved and she notes she is tolerating the medication well.    Patient Active Problem List   Diagnosis Date Noted   SI (sacroiliac) joint dysfunction 08/19/2021   Somatic dysfunction of spine, lumbar 07/10/2021   Fracture of right ankle, lateral malleolus 04/02/2021   Ductal carcinoma in situ (DCIS) of left breast 03/11/2021   Cellulitis of left breast 02/13/2021   Cellulitis of other sites 02/10/2021   Postmenopausal bleeding 42/59/5638   Lichen sclerosus et  atrophicus 04/18/2019   Genetic testing 01/20/2019   Malignant neoplasm of upper-outer quadrant of left breast in female, estrogen receptor  positive ([place]) 01/16/2019   TMJ pain dysfunction syndrome 05/26/2018   Achilles tendinosis of left lower extremity 11/30/2016   Depression, major 01/10/2013   Generalized anxiety disorder 01/10/2013   Osteoporosis of lumbar spine 04/05/2012   Attention deficit disorder without mention of hyperactivity 04/05/2012   Tendonitis of wrist, right 02/05/2011    is allergic to wellbutrin xl [bupropion].  MEDICAL HISTORY: Past Medical History:  Diagnosis Date   Anxiety    Breast mass, left    Cancer ([place])    Complication of anesthesia    felt lethargic x 4 days following last surgery in 2017, but 2018 did okay   Depression    Headache    migraines   History of radiation therapy 03/13/19- 04/11/19   Left Breast 16 fraction of 2. 66 Gy each to total 42.56 Gy. Left breast boost 4 fractions of 2 Gy to total 8 Gy.    Osteopenia     SURGICAL HISTORY: Past Surgical History:  Procedure Laterality Date   BREAST BIOPSY Left 07/10/2015   high risk stereo   BREAST EXCISIONAL BIOPSY Left 08/15/2015   ADH   BREAST LUMPECTOMY WITH RADIOACTIVE SEED LOCALIZATION Left 08/15/2015   Procedure:  LEFT BREAST LUMPECTOMY WITH RADIOACTIVE SEED LOCALIZATION;  Surgeon: Erroll Luna, MD;  Location: [facility];  Service: General;  Laterality: Left;   BREAST LUMPECTOMY WITH RADIOACTIVE SEED LOCALIZATION Left 02/09/2019   Procedure: LEFT BREAST LUMPECTOMY WITH RADIOACTIVE SEED LOCALIZATION;  Surgeon: [name], MD;  Location: [facility];  Service: General;  Laterality: Left;   EXCISION OF BREAST BIOPSY Left 12/30/2016   Benign ALH sclerosing lesion   RADIOACTIVE SEED GUIDED EXCISIONAL BREAST BIOPSY Left 12/30/2016   Procedure: RADIOACTIVE SEED GUIDED EXCISIONAL LEFT BREAST BIOPSY;  Surgeon: [name], MD;  Location: [facility];  Service: General;  Laterality: Left;   RADIOACTIVE SEED GUIDED EXCISIONAL BREAST BIOPSY Left 02/09/2019   Procedure: RADIOACTIVE SEED GUIDED  EXCISIONAL LEFT  BREAST BIOPSY;  Surgeon: [name], MD;  Location: [facility];  Service: General;  Laterality: Left;   TONSILLECTOMY  Age 38   TRIGGER FINGER RELEASE  2010   Right Thumb    SOCIAL HISTORY: Social History   Socioeconomic History   Marital status: Married    Spouse name: Not on file   Number of children: Not on file   Years of education: Not on file   Highest education level: Not on file  Occupational History   Occupation: RN    Employer: [employer]  Tobacco Use   Smoking status: Never   Smokeless tobacco: Never  Vaping Use   Vaping Use: Never used  Substance and Sexual Activity   Alcohol use: No    Comment: rare use   Drug use: No   Sexual activity: Yes    Partners: Male    Birth control/protection: Post-menopausal  Other Topics Concern   Not on file  Social History Narrative   04/05/12 AM he was born and grew up in [place]. She has 3 older brothers. She never suffered any abuse. Her father died when she was 16 years of age. She completed her bachelor of science in nursing in 1989, and has been working as an Therapist, sports . She has been married for 21 years, and she and her husband have a 29 year old son and a 56 year old daughter. She affiliates  as [employer]. She denies any legal involvement. She enjoys hiking and reading. She reports her social support consists of friends from college, and a friend from church. 04/05/2012   Social Determinants of Health   Financial Resource Strain: Not on file  Food Insecurity: Not on file  Transportation Needs: Not on file  Physical Activity: Not on file  Stress: Not on file  Social Connections: Not on file  Intimate Partner Violence: Not on file    FAMILY HISTORY: Family History  Problem Relation Age of Onset   Depression Brother    Atrial fibrillation Brother    Hypertension Mother    Osteoporosis Mother    ADD / ADHD Son    ADD / ADHD Daughter    Diabetes Maternal Grandmother    Diabetes  Maternal Grandfather    Diabetes Paternal Grandmother    Diabetes Paternal Grandfather    Parkinson's disease Maternal Aunt     Review of Systems  Constitutional:  Negative for appetite change, chills, fatigue, fever and unexpected weight change.  HENT:   Negative for hearing loss, lump/mass and trouble swallowing.   Eyes:  Negative for eye problems and icterus.  Respiratory:  Negative for chest tightness, cough and shortness of breath.   Cardiovascular:  Negative for chest pain, leg swelling and palpitations.  Gastrointestinal:  Negative for abdominal distention, abdominal pain, constipation, diarrhea, nausea and vomiting.  Endocrine: Negative for hot flashes.  Genitourinary:  Negative for difficulty urinating.   Musculoskeletal:  Negative for arthralgias.  Skin:  Negative for itching and rash.  Neurological:  Negative for dizziness, extremity weakness, headaches and numbness.  Hematological:  Negative for adenopathy. Does not bruise/bleed easily.  Psychiatric/Behavioral:  Negative for depression. The patient is not nervous/anxious.      PHYSICAL EXAMINATION  ECOG PERFORMANCE STATUS: 1 - Symptomatic but completely ambulatory Patient appears well.  She is in no apparent distress.  Mood and behavior are normal.  Breathing appears non labored, skin visualized without rash or lesion.   LABORATORY DATA:  CBC    Component Value Date/Time   WBC 10.2 05/29/2021 1430   WBC 6.7 02/02/2019 1000   RBC 3.87 05/29/2021 1430   HGB 11.8 (L) 05/29/2021 1430   HGB 13.4 10/24/2019 1114   HCT 35.2 (L) 05/29/2021 1430   HCT 39.8 10/24/2019 1114   PLT 338 05/29/2021 1430   PLT 332 10/24/2019 1114   MCV 91.0 05/29/2021 1430   MCV 90 10/24/2019 1114   MCH 30.5 05/29/2021 1430   MCHC 33.5 05/29/2021 1430   RDW 11.9 05/29/2021 1430   RDW 11.8 10/24/2019 1114   LYMPHSABS 1.7 05/29/2021 1430   MONOABS 0.6 05/29/2021 1430   EOSABS 0.0 05/29/2021 1430   BASOSABS 0.0 05/29/2021 1430    CMP      Component Value Date/Time   NA 141 05/29/2021 1430   NA 142 10/24/2019 1116   K 3.7 05/29/2021 1430   CL 106 05/29/2021 1430   CO2 29 05/29/2021 1430   GLUCOSE 87 05/29/2021 1430   BUN 23 (H) 05/29/2021 1430   BUN 11 10/24/2019 1116   CREATININE 0.75 05/29/2021 1430   CALCIUM 9.1 05/29/2021 1430   PROT 7.1 05/29/2021 1430   PROT 7.1 10/24/2019 1116   ALBUMIN 4.1 05/29/2021 1430   ALBUMIN 4.6 10/24/2019 1116   AST 17 05/29/2021 1430   ALT 9 05/29/2021 1430   ALKPHOS 44 05/29/2021 1430   BILITOT 0.2 (L) 05/29/2021 1430   GFRNONAA >60 05/29/2021 1430  GFRAA >60 01/24/2020 1033      ASSESSMENT and THERAPY PLAN:   Malignant neoplasm of upper-outer quadrant of left breast in female, estrogen receptor positive ([place]) Stephanie [name] continues on tamoxifen with good tolerance.  She switched from zoloft to effexor and tolerated that change well.  She will stay on this dose and will let us know if she decides she needs to increase to 80m daily.  We will see Stephanie [name] back in 4 months for f/u.    Follow up instructions:    -Return to cancer center in 4 months for f/u    The patient was provided an opportunity to ask questions and all were answered. The patient agreed with the plan and demonstrated an understanding of the instructions.   The patient was advised to call back or seek an in-person evaluation if the symptoms worsen or if the condition fails to improve as anticipated.   I provided 10 minutes of face-to-face video visit time during this encounter, and > 50% was spent counseling as documented under my assessment & plan.  LWilber Bihari NP 09/14/21 8:47 AM Medical Oncology and H[facility] [place] [id number]. [phone]   Fax. [phone]

## 2021-09-14 ENCOUNTER — Encounter: Payer: Self-pay | Admitting: Oncology

## 2021-09-14 NOTE — Assessment & Plan Note (Addendum)
Stephanie [name] continues on tamoxifen with good tolerance.  She switched from zoloft to effexor and tolerated that change well.  She will stay on this dose and will let us know if she decides she needs to increase to '75mg'$  daily.  We will see Stephanie [name] back in 4 months for f/u.

## 2021-10-03 ENCOUNTER — Telehealth: Payer: 59 | Admitting: Nurse Practitioner

## 2021-10-03 DIAGNOSIS — R21 Rash and other nonspecific skin eruption: Secondary | ICD-10-CM

## 2021-10-04 ENCOUNTER — Other Ambulatory Visit (HOSPITAL_COMMUNITY): Payer: Self-pay

## 2021-10-04 MED ORDER — TRIAMCINOLONE ACETONIDE 0.1 % EX CREA
1.0000 "application " | TOPICAL_CREAM | Freq: Two times a day (BID) | CUTANEOUS | 0 refills | Status: AC
  Filled 2021-10-04 – 2022-02-10 (×2): qty 30, 15d supply, fill #0

## 2021-10-04 NOTE — Progress Notes (Signed)
E Visit for Rash  We are sorry that you are not feeling well. Here is how we plan to help!  Based on what you shared with me it looks like you have contact dermatitis.  Contact dermatitis is a skin rash caused by something that touches the skin and causes irritation or inflammation.  Your skin may be red, swollen, dry, cracked, and itch.  The rash should go away in a few days but can last a few weeks.  If you get a rash, it's important to figure out what caused it so the irritant can be avoided in the future.  I have prescribed triamcinolone cream to be used BID   HOME CARE:  Take cool showers and avoid direct sunlight. Apply cool compress or wet dressings. Take a bath in an oatmeal bath.  Sprinkle content of one Aveeno packet under running faucet with comfortably warm water.  Bathe for 15-20 minutes, 1-2 times daily.  Pat dry with a towel. Do not rub the rash. Use hydrocortisone cream. Take an antihistamine like Benadryl for widespread rashes that itch.  The adult dose of Benadryl is 25-50 mg by mouth 4 times daily. Caution:  This type of medication may cause sleepiness.  Do not drink alcohol, drive, or operate dangerous machinery while taking antihistamines.  Do not take these medications if you have prostate enlargement.  Read package instructions thoroughly on all medications that you take.  GET HELP RIGHT AWAY IF:  Symptoms don't go away after treatment. Severe itching that persists. If you rash spreads or swells. If you rash begins to smell. If it blisters and opens or develops a yellow-brown crust. You develop a fever. You have a sore throat. You become short of breath.  MAKE SURE YOU:  Understand these instructions. Will watch your condition. Will get help right away if you are not doing well or get worse.  Thank you for choosing an e-visit.  Your e-visit answers were reviewed by a board certified advanced clinical practitioner to complete your personal care plan.  Depending upon the condition, your plan could have included both over the counter or prescription medications.  Please review your pharmacy choice. Make sure the pharmacy is open so you can pick up prescription now. If there is a problem, you may contact your provider through [employer] and have the prescription routed to another pharmacy.  Your safety is important to [place]. If you have drug allergies check your prescription carefully.   For the next 24 hours you can use MyChart to ask questions about today's visit, request a non-urgent call back, or ask for a work or school excuse. You will get an email in the next two days asking about your experience. I hope that your e-visit has been valuable and will speed your recovery.  5-10 minutes spent reviewing and documenting in chart.

## 2021-10-08 ENCOUNTER — Encounter: Payer: Self-pay | Admitting: Family Medicine

## 2021-10-13 ENCOUNTER — Other Ambulatory Visit (HOSPITAL_COMMUNITY): Payer: Self-pay

## 2021-10-22 ENCOUNTER — Other Ambulatory Visit (HOSPITAL_BASED_OUTPATIENT_CLINIC_OR_DEPARTMENT_OTHER): Payer: Self-pay

## 2021-10-24 ENCOUNTER — Other Ambulatory Visit (HOSPITAL_BASED_OUTPATIENT_CLINIC_OR_DEPARTMENT_OTHER): Payer: Self-pay

## 2021-11-06 ENCOUNTER — Encounter: Payer: Self-pay | Admitting: Family Medicine

## 2021-11-09 ENCOUNTER — Telehealth: Payer: 59 | Admitting: Family

## 2021-11-09 DIAGNOSIS — L0390 Cellulitis, unspecified: Secondary | ICD-10-CM

## 2021-11-09 MED ORDER — DOXYCYCLINE HYCLATE 100 MG PO TABS: 100.0000 mg | ORAL_TABLET | Freq: Two times a day (BID) | ORAL | 0 refills | Status: DC

## 2021-11-09 NOTE — Progress Notes (Signed)
E Visit for Cellulitis  We are sorry that you are not feeling well. Here is how we plan to help!  Based on what you shared with me it looks like you have cellulitis.  Cellulitis looks like areas of skin redness, swelling, and warmth; it develops as a result of bacteria entering under the skin. Little red spots and/or bleeding can be seen in skin, and tiny surface sacs containing fluid can occur. Fever can be present. Cellulitis is almost always on one side of a body, and the lower limbs are the most common site of involvement.   I have prescribed:  doxycyline 100 mg BID for 10 days.   HOME CARE:  Take your medications as ordered and take all of them, even if the skin irritation appears to be healing.   GET HELP RIGHT AWAY IF:  Symptoms that don't begin to go away within 48 hours. Severe redness persists or worsens If the area turns color, spreads or swells. If it blisters and opens, develops yellow-brown crust or bleeds. You develop a fever or chills. If the pain increases or becomes unbearable.  Are unable to keep fluids and food down.  MAKE SURE YOU   Understand these instructions. Will watch your condition. Will get help right away if you are not doing well or get worse.  Thank you for choosing an e-visit.  Your e-visit answers were reviewed by a board certified advanced clinical practitioner to complete your personal care plan. Depending upon the condition, your plan could have included both over the counter or prescription medications.  Please review your pharmacy choice. Make sure the pharmacy is open so you can pick up prescription now. If there is a problem, you may contact your provider through [employer] and have the prescription routed to another pharmacy.  Your safety is important to [place]. If you have drug allergies check your prescription carefully.   For the next 24 hours you can use MyChart to ask questions about today's visit, request a non-urgent call back, or  ask for a work or school excuse. You will get an email in the next two days asking about your experience. I hope that your e-visit has been valuable and will speed your recovery.   Approximately 5 minutes was spent documenting and reviewing patient's chart.

## 2021-11-10 ENCOUNTER — Other Ambulatory Visit (HOSPITAL_BASED_OUTPATIENT_CLINIC_OR_DEPARTMENT_OTHER): Payer: Self-pay

## 2021-11-10 ENCOUNTER — Encounter: Payer: Self-pay | Admitting: Adult Health

## 2021-11-10 ENCOUNTER — Other Ambulatory Visit: Payer: Self-pay

## 2021-11-10 MED ORDER — CEFADROXIL 500 MG PO CAPS
500.0000 mg | ORAL_CAPSULE | Freq: Two times a day (BID) | ORAL | 0 refills | Status: DC
  Filled 2021-11-10: qty 10, 5d supply, fill #0
  Filled 2021-11-10: qty 4, 2d supply, fill #0

## 2021-11-10 NOTE — Progress Notes (Unsigned)
[place] [facility] [place] Phone: [phone] Subjective:   Stephanie [name], am serving as a scribe for Dr. Hulan Saas.   I'm seeing this patient by the request  of:  [name], MD  CC: Back and neck pain follow-up  [id number]  08/19/2021 Chronic discomfort over the sacroiliac joint.  Responded extremely well though to osteopathic manipulation.  Discussed with abductor strengthening as well as STRETCHING.  PATIENT WORK WITH ATHLETIC TRAINER TO LEARN THESE IN GREATER DETAIL.  DISCUSSED WHICH ACTIVITIES TO DO WHICH ONES TO AVOID, INCREASE ACTIVITY OTHERWISE SLOWLY FOLLOW-UP WITH ME AGAIN IN 6 TO 8 WEEKS.  Updated 11/11/2021 Stephanie [name] is a 56 y.o. female coming in with complaint of L ankle pain. OMT last visit as well. Patient states that she is doing much better. Tripped off a curb and did not turn ankle. It felt great afterwards. Had some leg cramps. Used electrolytes and cramps went away.   [name] issues with her spine with traveling. Does feel like manipulation helps.        Past Medical History:  Diagnosis Date   Anxiety    Breast mass, left    Cancer ([place])    Complication of anesthesia    felt lethargic x 4 days following last surgery in 2017, but 2018 did okay   Depression    Headache    migraines   History of radiation therapy 03/13/19- 04/11/19   Left Breast 16 fraction of 2. 66 Gy each to total 42.56 Gy. Left breast boost 4 fractions of 2 Gy to total 8 Gy.    Osteopenia    Past Surgical History:  Procedure Laterality Date   BREAST BIOPSY Left 07/10/2015   high risk stereo   BREAST EXCISIONAL BIOPSY Left 08/15/2015   ADH   BREAST LUMPECTOMY WITH RADIOACTIVE SEED LOCALIZATION Left 08/15/2015   Procedure:  LEFT BREAST LUMPECTOMY WITH RADIOACTIVE SEED LOCALIZATION;  Surgeon: Erroll Luna, MD;  Location: [facility];  Service: General;  Laterality: Left;   BREAST LUMPECTOMY WITH  RADIOACTIVE SEED LOCALIZATION Left 02/09/2019   Procedure: LEFT BREAST LUMPECTOMY WITH RADIOACTIVE SEED LOCALIZATION;  Surgeon: [name], MD;  Location: [facility];  Service: General;  Laterality: Left;   EXCISION OF BREAST BIOPSY Left 12/30/2016   Benign ALH sclerosing lesion   RADIOACTIVE SEED GUIDED EXCISIONAL BREAST BIOPSY Left 12/30/2016   Procedure: RADIOACTIVE SEED GUIDED EXCISIONAL LEFT BREAST BIOPSY;  Surgeon: [name], MD;  Location: [facility];  Service: General;  Laterality: Left;   RADIOACTIVE SEED GUIDED EXCISIONAL BREAST BIOPSY Left 02/09/2019   Procedure: RADIOACTIVE SEED GUIDED EXCISIONAL LEFT  BREAST BIOPSY;  Surgeon: [name], MD;  Location: [facility];  Service: General;  Laterality: Left;   TONSILLECTOMY  Age 85   TRIGGER FINGER RELEASE  2010   Right Thumb   Social History   Socioeconomic History   Marital status: Married    Spouse name: Not on file   Number of children: Not on file   Years of education: Not on file   Highest education level: Not on file  Occupational History   Occupation: RN    Employer: [employer]  Tobacco Use   Smoking status: Never   Smokeless tobacco: Never  Vaping Use   Vaping Use: Never used  Substance and Sexual Activity   Alcohol use: [name]    Comment: rare use   Drug use: [name]   Sexual activity: Yes  Partners: Male    Birth control/protection: Post-menopausal  Other Topics Concern   Not on file  Social History Narrative   04/05/12 AM he was born and grew up in [facility]. She has 3 older brothers. She never suffered any abuse. Her father died when she was 59 years of age. She completed her bachelor of science in nursing in 1989, and has been working as an Therapist, sports . She has been married for 21 years, and she and her husband have a 50 year old son and a 45 year old daughter. She affiliates as Psychologist, forensic. She denies any legal involvement. She enjoys hiking and reading. She reports her social  support consists of friends from college, and a friend from church. 04/05/2012   Social Determinants of Health   Financial Resource Strain: Not on file  Food Insecurity: Not on file  Transportation Needs: [name] Transportation Needs (03/03/2019)   PRAPARE - Hydrologist (Medical): [name]    Lack of Transportation (Non-Medical): [name]  Physical Activity: Not on file  Stress: Not on file  Social Connections: Not on file   Allergies  Allergen Reactions   Wellbutrin Xl [Bupropion] Hives   Family History  Problem Relation Age of Onset   Depression Brother    Atrial fibrillation Brother    Hypertension Mother    Osteoporosis Mother    ADD / ADHD Son    ADD / ADHD Daughter    Diabetes Maternal Grandmother    Diabetes Maternal Grandfather    Diabetes Paternal Grandmother    Diabetes Paternal Grandfather    Parkinson's disease Maternal Aunt     Current Outpatient Medications (Endocrine & Metabolic):    alendronate (FOSAMAX) 70 MG tablet, TAKE 1 TABLET BY MOUTH ONCE A WEEK. TAKE WITH A FULL GLASS OF WATER ON AN EMPTY STOMACH           Current Outpatient Medications (Other):    Biotin w/ Vitamins C & E (HAIR/SKIN/NAILS PO), Take 1 tablet by mouth daily.    Calcium Carb-Cholecalciferol (CALCIUM + D3 PO), Take 1 tablet by mouth daily.    calcium carbonate (OS-CAL) 1250 (500 Ca) MG chewable tablet, Chew 1 tablet by mouth daily.   cefadroxil (DURICEF) 500 MG capsule, Take 1 capsule (500 mg total) by mouth 2 (two) times daily.   doxycycline (VIBRA-TABS) 100 MG tablet, Take 1 tablet (100 mg total) by mouth 2 (two) times daily.   estradiol (ESTRING) 2 MG vaginal ring, Place 2 mg vaginally every 3 (three) months. follow package directions   gabapentin (NEURONTIN) 300 MG capsule, TAKE 1 CAPSULE BY MOUTH AT BEDTIME   tamoxifen (NOLVADEX) 20 MG tablet, TAKE 1 TABLET BY MOUTH ONCE DAILY   triamcinolone cream (KENALOG) 0.1 %, Apply to affected area 2 (two) times daily.    venlafaxine XR (EFFEXOR-XR) 37.5 MG 24 hr capsule, Take 1 capsule (37.5 mg total) by mouth daily with breakfast.   zolpidem (AMBIEN) 5 MG tablet, Take 1 tablet (5 mg total) by mouth at bedtime as needed for sleep.   Facility-Administered Medications Ordered in Other Visits (Other):    0.9 %  sodium chloride infusion [name] current facility-administered medications for this visit.   Reviewed prior external information including notes and imaging from  primary care provider As well as notes that were available from care everywhere and other healthcare systems.  Past medical history, social, surgical and family history all reviewed in electronic medical record.  [name] pertanent information unless stated regarding to the chief complaint.  Review of Systems:  [name] headache, visual changes, nausea, vomiting, diarrhea, constipation, dizziness, abdominal pain, skin rash, fevers, chills, night sweats, weight loss, swollen lymph nodes, body aches, joint swelling, chest pain, shortness of breath, mood changes. POSITIVE muscle aches  Objective  Blood pressure 102/68, pulse 71, height '5\' 3"'$  (1.6 m), weight 154 lb (69.9 kg), last menstrual period 01/25/2014, SpO2 98 %.   General: [name] apparent distress alert and oriented x3 mood and affect normal, dressed appropriately.  HEENT: Pupils equal, extraocular movements intact  Respiratory: Patient's speak in full sentences and does not appear short of breath  Cardiovascular: [name] lower extremity edema, non tender, [name] erythema  Ankle exam seems to be unremarkable.  Patient was able to move it with [name] significant difficulty. Low back exam still has some tightness noted.  Some tenderness to palpation. Patient does have some limited sidebending of the neck bilaterally.  Nothing severe.  Osteopathic findings C4 flexed rotated and side bent left T3 extended rotated and side bent right inhaled third rib T7 extended rotated and side bent left L2 flexed rotated and side  bent right Sacrum right on right    Impression and Recommendations:    The above documentation has been reviewed and is accurate and complete Stephanie Pulley, DO

## 2021-11-11 ENCOUNTER — Other Ambulatory Visit (HOSPITAL_BASED_OUTPATIENT_CLINIC_OR_DEPARTMENT_OTHER): Payer: Self-pay

## 2021-11-11 ENCOUNTER — Encounter: Payer: Self-pay | Admitting: Family Medicine

## 2021-11-11 ENCOUNTER — Ambulatory Visit: Payer: 59 | Admitting: Family Medicine

## 2021-11-11 ENCOUNTER — Ambulatory Visit: Payer: Self-pay

## 2021-11-11 VITALS — BP 102/68 | HR 71 | Ht 63.0 in | Wt 154.0 lb

## 2021-11-11 DIAGNOSIS — M9908 Segmental and somatic dysfunction of rib cage: Secondary | ICD-10-CM

## 2021-11-11 DIAGNOSIS — M533 Sacrococcygeal disorders, not elsewhere classified: Secondary | ICD-10-CM | POA: Diagnosis not present

## 2021-11-11 DIAGNOSIS — M9903 Segmental and somatic dysfunction of lumbar region: Secondary | ICD-10-CM | POA: Diagnosis not present

## 2021-11-11 DIAGNOSIS — G8929 Other chronic pain: Secondary | ICD-10-CM

## 2021-11-11 DIAGNOSIS — M25572 Pain in left ankle and joints of left foot: Secondary | ICD-10-CM

## 2021-11-11 DIAGNOSIS — M9904 Segmental and somatic dysfunction of sacral region: Secondary | ICD-10-CM | POA: Diagnosis not present

## 2021-11-11 DIAGNOSIS — M9901 Segmental and somatic dysfunction of cervical region: Secondary | ICD-10-CM | POA: Diagnosis not present

## 2021-11-11 DIAGNOSIS — M9902 Segmental and somatic dysfunction of thoracic region: Secondary | ICD-10-CM

## 2021-11-11 NOTE — Assessment & Plan Note (Signed)
Patient still has discomfort noted.  We discussed with patient about icing regimen and home exercises.  Patient did do very well with recent travel.  Follow-up again in 6 to 8 weeks.

## 2021-11-11 NOTE — Patient Instructions (Signed)
See me again in 2-3 months

## 2021-11-12 ENCOUNTER — Other Ambulatory Visit: Payer: Self-pay | Admitting: Internal Medicine

## 2021-11-12 ENCOUNTER — Other Ambulatory Visit (HOSPITAL_BASED_OUTPATIENT_CLINIC_OR_DEPARTMENT_OTHER): Payer: Self-pay

## 2021-11-12 MED ORDER — CEFADROXIL 500 MG PO CAPS
1000.0000 mg | ORAL_CAPSULE | Freq: Two times a day (BID) | ORAL | 0 refills | Status: DC
  Filled 2021-11-12: qty 28, 7d supply, fill #0

## 2021-11-18 ENCOUNTER — Encounter (INDEPENDENT_AMBULATORY_CARE_PROVIDER_SITE_OTHER): Payer: 59 | Admitting: Internal Medicine

## 2021-11-18 DIAGNOSIS — L0390 Cellulitis, unspecified: Secondary | ICD-10-CM | POA: Diagnosis not present

## 2021-11-18 NOTE — Telephone Encounter (Signed)
Please see the MyChart message reply(ies) for my assessment and plan.    This patient gave consent for this Medical Advice Message and is aware that it may result in a bill to [employer], as well as the possibility of receiving a bill for a co-payment or deductible. They are an established patient, but are not seeking medical advice exclusively about a problem treated during an in person or video visit in the last seven days. I did not recommend an in person or video visit within seven days of my reply.    I spent a total of 15 minutes cumulative time within 7 days through [employer].  Mignon Pine, DO

## 2021-11-24 ENCOUNTER — Other Ambulatory Visit: Payer: Self-pay

## 2021-11-24 ENCOUNTER — Encounter: Payer: Self-pay | Admitting: Adult Health

## 2021-11-24 ENCOUNTER — Other Ambulatory Visit (HOSPITAL_BASED_OUTPATIENT_CLINIC_OR_DEPARTMENT_OTHER): Payer: Self-pay

## 2021-11-24 MED ORDER — VENLAFAXINE HCL ER 75 MG PO CP24
75.0000 mg | ORAL_CAPSULE | Freq: Every day | ORAL | 1 refills | Status: DC
  Filled 2021-11-24: qty 90, 90d supply, fill #0
  Filled 2022-02-10: qty 90, 90d supply, fill #1

## 2021-11-26 DIAGNOSIS — D0512 Intraductal carcinoma in situ of left breast: Secondary | ICD-10-CM | POA: Diagnosis not present

## 2021-12-03 ENCOUNTER — Other Ambulatory Visit: Payer: Self-pay | Admitting: *Deleted

## 2021-12-03 ENCOUNTER — Encounter: Payer: Self-pay | Admitting: Hematology and Oncology

## 2021-12-03 DIAGNOSIS — C50412 Malignant neoplasm of upper-outer quadrant of left female breast: Secondary | ICD-10-CM

## 2021-12-03 DIAGNOSIS — M810 Age-related osteoporosis without current pathological fracture: Secondary | ICD-10-CM

## 2021-12-08 ENCOUNTER — Other Ambulatory Visit (HOSPITAL_BASED_OUTPATIENT_CLINIC_OR_DEPARTMENT_OTHER): Payer: Self-pay

## 2022-01-01 ENCOUNTER — Telehealth: Payer: 59 | Admitting: Family Medicine

## 2022-01-01 DIAGNOSIS — R197 Diarrhea, unspecified: Secondary | ICD-10-CM | POA: Diagnosis not present

## 2022-01-01 NOTE — Progress Notes (Signed)
We are sorry that you are not feeling well.  Here is how we plan to help!  Based on what you have shared with me it looks like you have Acute Infectious Diarrhea.  Most cases of acute diarrhea are due to infections with virus and bacteria and are self-limited conditions lasting less than 14 days.  For your symptoms you may take Imodium 2 mg tablets that are over the counter at your local pharmacy. Take two tablet now and then one after each loose stool up to 6 a day.  Antibiotics are not needed for most people with diarrhea.   HOME CARE We recommend changing your diet to help with your symptoms for the next few days. Drink plenty of fluids that contain water salt and sugar. Sports drinks such as Gatorade may help.  You may try broths, soups, bananas, applesauce, soft breads, mashed potatoes or crackers.  You are considered infectious for as long as the diarrhea continues. Hand washing or use of alcohol based hand sanitizers is recommend. It is best to stay out of work or school until your symptoms stop.   GET HELP RIGHT AWAY If you have dark yellow colored urine or do not pass urine frequently you should drink more fluids.   If your symptoms worsen  If you feel like you are going to pass out (faint) You have a new problem  MAKE SURE YOU  Understand these instructions. Will watch your condition. Will get help right away if you are not doing well or get worse.  Thank you for choosing an e-visit.  Your e-visit answers were reviewed by a board certified advanced clinical practitioner to complete your personal care plan. Depending upon the condition, your plan could have included both over the counter or prescription medications.  Please review your pharmacy choice. Make sure the pharmacy is open so you can pick up prescription now. If there is a problem, you may contact your provider through [employer] and have the prescription routed to another pharmacy.  Your safety is important  to [place]. If you have drug allergies check your prescription carefully.   For the next 24 hours you can use MyChart to ask questions about today's visit, request a non-urgent call back, or ask for a work or school excuse. You will get an email in the next two days asking about your experience. I hope that your e-visit has been valuable and will speed your recovery.  I provided 5 minutes of non face-to-face time during this encounter for chart review, medication and order placement, as well as and documentation.

## 2022-01-08 NOTE — Progress Notes (Deleted)
  [facility] [place] Phone: [phone] Subjective:    I'm seeing this patient by the request  of:  [name], MD  CC:   [id number]  Stephanie [name] is a 56 y.o. female coming in with complaint of back and neck pain. OMT on 12/11/2021. Patient states   Medications patient has been prescribed: None  Taking:         Reviewed prior external information including notes and imaging from previsou exam, outside providers and external EMR if available.   As well as notes that were available from care everywhere and other healthcare systems.  Past medical history, social, surgical and family history all reviewed in electronic medical record.  No pertanent information unless stated regarding to the chief complaint.   Past Medical History:  Diagnosis Date   Anxiety    Breast mass, left    Cancer ([place])    Complication of anesthesia    felt lethargic x 4 days following last surgery in 2017, but 2018 did okay   Depression    Headache    migraines   History of radiation therapy 03/13/19- 04/11/19   Left Breast 16 fraction of 2. 66 Gy each to total 42.56 Gy. Left breast boost 4 fractions of 2 Gy to total 8 Gy.    Osteopenia     Allergies  Allergen Reactions   Wellbutrin Xl [Bupropion] Hives     Review of Systems:  No headache, visual changes, nausea, vomiting, diarrhea, constipation, dizziness, abdominal pain, skin rash, fevers, chills, night sweats, weight loss, swollen lymph nodes, body aches, joint swelling, chest pain, shortness of breath, mood changes. POSITIVE muscle aches  Objective  Last menstrual period 01/25/2014.   General: No apparent distress alert and oriented x3 mood and affect normal, dressed appropriately.  HEENT: Pupils equal, extraocular movements intact  Respiratory: Patient's speak in full sentences and does not appear short of breath  Cardiovascular: No lower extremity edema, non  tender, no erythema  Gait MSK:  Back   Osteopathic findings  C2 flexed rotated and side bent right C6 flexed rotated and side bent left T3 extended rotated and side bent right inhaled rib T9 extended rotated and side bent left L2 flexed rotated and side bent right Sacrum right on right       Assessment and Plan:  No problem-specific Assessment & Plan notes found for this encounter.    Nonallopathic problems  Decision today to treat with OMT was based on Physical Exam  After verbal consent patient was treated with HVLA, ME, FPR techniques in cervical, rib, thoracic, lumbar, and sacral  areas  Patient tolerated the procedure well with improvement in symptoms  Patient given exercises, stretches and lifestyle modifications  See medications in patient instructions if given  Patient will follow up in 4-8 weeks             Note: This dictation was prepared with Dragon dictation along with smaller phrase technology. Any transcriptional errors that result from this process are unintentional.

## 2022-01-13 ENCOUNTER — Other Ambulatory Visit (HOSPITAL_BASED_OUTPATIENT_CLINIC_OR_DEPARTMENT_OTHER): Payer: Self-pay

## 2022-01-13 ENCOUNTER — Ambulatory Visit: Payer: 59 | Admitting: Family Medicine

## 2022-01-13 ENCOUNTER — Other Ambulatory Visit: Payer: Self-pay | Admitting: Internal Medicine

## 2022-01-13 DIAGNOSIS — N61 Mastitis without abscess: Secondary | ICD-10-CM

## 2022-01-13 MED ORDER — CEFADROXIL 500 MG PO CAPS
1000.0000 mg | ORAL_CAPSULE | Freq: Two times a day (BID) | ORAL | 0 refills | Status: DC
  Filled 2022-01-13: qty 40, 10d supply, fill #0

## 2022-01-13 NOTE — Telephone Encounter (Signed)
Okay to continue filling? Looks like she's follow up PRN - thanks!

## 2022-01-21 ENCOUNTER — Ambulatory Visit
Admission: RE | Admit: 2022-01-21 | Discharge: 2022-01-21 | Disposition: A | Discharge status: Home / Self Care | Payer: 59 | Source: Ambulatory Visit | Attending: Hematology and Oncology | Admitting: Hematology and Oncology

## 2022-01-21 ENCOUNTER — Other Ambulatory Visit: Payer: Self-pay | Admitting: Hematology and Oncology

## 2022-01-21 ENCOUNTER — Encounter: Payer: Self-pay | Admitting: Hematology and Oncology

## 2022-01-21 DIAGNOSIS — Z1231 Encounter for screening mammogram for malignant neoplasm of breast: Secondary | ICD-10-CM | POA: Diagnosis not present

## 2022-01-21 DIAGNOSIS — Z170 Estrogen receptor positive status [ER+]: Secondary | ICD-10-CM

## 2022-01-21 DIAGNOSIS — Z780 Asymptomatic menopausal state: Secondary | ICD-10-CM | POA: Diagnosis not present

## 2022-01-21 DIAGNOSIS — M810 Age-related osteoporosis without current pathological fracture: Secondary | ICD-10-CM

## 2022-02-10 ENCOUNTER — Other Ambulatory Visit: Payer: Self-pay | Admitting: Family Medicine

## 2022-02-10 ENCOUNTER — Other Ambulatory Visit (HOSPITAL_BASED_OUTPATIENT_CLINIC_OR_DEPARTMENT_OTHER): Payer: Self-pay

## 2022-02-10 DIAGNOSIS — M810 Age-related osteoporosis without current pathological fracture: Secondary | ICD-10-CM

## 2022-02-22 ENCOUNTER — Other Ambulatory Visit: Payer: Self-pay | Admitting: Family Medicine

## 2022-02-22 ENCOUNTER — Other Ambulatory Visit (HOSPITAL_BASED_OUTPATIENT_CLINIC_OR_DEPARTMENT_OTHER): Payer: Self-pay

## 2022-02-22 DIAGNOSIS — M810 Age-related osteoporosis without current pathological fracture: Secondary | ICD-10-CM

## 2022-02-24 ENCOUNTER — Other Ambulatory Visit (HOSPITAL_BASED_OUTPATIENT_CLINIC_OR_DEPARTMENT_OTHER): Payer: Self-pay

## 2022-02-27 ENCOUNTER — Other Ambulatory Visit (HOSPITAL_BASED_OUTPATIENT_CLINIC_OR_DEPARTMENT_OTHER): Payer: Self-pay

## 2022-02-27 ENCOUNTER — Other Ambulatory Visit: Payer: Self-pay

## 2022-03-02 ENCOUNTER — Other Ambulatory Visit (HOSPITAL_BASED_OUTPATIENT_CLINIC_OR_DEPARTMENT_OTHER): Payer: Self-pay

## 2022-03-03 ENCOUNTER — Other Ambulatory Visit: Payer: Self-pay

## 2022-03-03 ENCOUNTER — Other Ambulatory Visit (HOSPITAL_BASED_OUTPATIENT_CLINIC_OR_DEPARTMENT_OTHER): Payer: Self-pay

## 2022-03-17 ENCOUNTER — Other Ambulatory Visit (HOSPITAL_BASED_OUTPATIENT_CLINIC_OR_DEPARTMENT_OTHER): Payer: Self-pay

## 2022-03-17 ENCOUNTER — Other Ambulatory Visit: Payer: Self-pay | Admitting: Internal Medicine

## 2022-03-17 ENCOUNTER — Other Ambulatory Visit: Payer: Self-pay | Admitting: Family Medicine

## 2022-03-17 DIAGNOSIS — M810 Age-related osteoporosis without current pathological fracture: Secondary | ICD-10-CM

## 2022-03-17 DIAGNOSIS — N61 Mastitis without abscess: Secondary | ICD-10-CM

## 2022-03-17 NOTE — Telephone Encounter (Signed)
Do you want to continue?

## 2022-03-18 ENCOUNTER — Other Ambulatory Visit (HOSPITAL_BASED_OUTPATIENT_CLINIC_OR_DEPARTMENT_OTHER): Payer: Self-pay

## 2022-03-18 MED ORDER — CEFADROXIL 500 MG PO CAPS
1000.0000 mg | ORAL_CAPSULE | Freq: Two times a day (BID) | ORAL | 0 refills | Status: DC
  Filled 2022-03-18 – 2022-06-22 (×2): qty 40, 10d supply, fill #0

## 2022-03-24 ENCOUNTER — Other Ambulatory Visit (HOSPITAL_BASED_OUTPATIENT_CLINIC_OR_DEPARTMENT_OTHER): Payer: Self-pay

## 2022-04-02 ENCOUNTER — Other Ambulatory Visit (HOSPITAL_BASED_OUTPATIENT_CLINIC_OR_DEPARTMENT_OTHER): Payer: Self-pay

## 2022-04-04 ENCOUNTER — Other Ambulatory Visit (HOSPITAL_BASED_OUTPATIENT_CLINIC_OR_DEPARTMENT_OTHER): Payer: Self-pay

## 2022-04-09 ENCOUNTER — Encounter: Payer: Self-pay | Admitting: Oncology

## 2022-04-11 ENCOUNTER — Encounter: Payer: Self-pay | Admitting: Oncology

## 2022-04-15 ENCOUNTER — Other Ambulatory Visit (HOSPITAL_BASED_OUTPATIENT_CLINIC_OR_DEPARTMENT_OTHER): Payer: Self-pay

## 2022-04-23 DIAGNOSIS — H52203 Unspecified astigmatism, bilateral: Secondary | ICD-10-CM | POA: Diagnosis not present

## 2022-04-23 DIAGNOSIS — H5213 Myopia, bilateral: Secondary | ICD-10-CM | POA: Diagnosis not present

## 2022-04-23 DIAGNOSIS — H40053 Ocular hypertension, bilateral: Secondary | ICD-10-CM | POA: Diagnosis not present

## 2022-04-24 ENCOUNTER — Encounter: Payer: Self-pay | Admitting: Hematology and Oncology

## 2022-05-04 ENCOUNTER — Other Ambulatory Visit: Payer: Self-pay | Admitting: Hematology and Oncology

## 2022-05-04 ENCOUNTER — Other Ambulatory Visit: Payer: Self-pay | Admitting: Adult Health

## 2022-05-04 ENCOUNTER — Other Ambulatory Visit (HOSPITAL_BASED_OUTPATIENT_CLINIC_OR_DEPARTMENT_OTHER): Payer: Self-pay

## 2022-05-04 DIAGNOSIS — M810 Age-related osteoporosis without current pathological fracture: Secondary | ICD-10-CM

## 2022-05-04 MED ORDER — VENLAFAXINE HCL ER 75 MG PO CP24
75.0000 mg | ORAL_CAPSULE | Freq: Every day | ORAL | 1 refills | Status: DC
  Filled 2022-05-04: qty 90, 90d supply, fill #0
  Filled 2022-08-04: qty 90, 90d supply, fill #1

## 2022-05-04 MED ORDER — TAMOXIFEN CITRATE 20 MG PO TABS
20.0000 mg | ORAL_TABLET | Freq: Every day | ORAL | 11 refills | Status: DC
  Filled 2022-05-04: qty 90, 90d supply, fill #0
  Filled 2022-08-04: qty 90, 90d supply, fill #1
  Filled 2022-11-06: qty 90, 90d supply, fill #2
  Filled 2023-02-01: qty 90, 90d supply, fill #3

## 2022-05-05 ENCOUNTER — Encounter: Payer: Self-pay | Admitting: Oncology

## 2022-05-05 ENCOUNTER — Other Ambulatory Visit (HOSPITAL_BASED_OUTPATIENT_CLINIC_OR_DEPARTMENT_OTHER): Payer: Self-pay

## 2022-05-05 ENCOUNTER — Encounter (HOSPITAL_BASED_OUTPATIENT_CLINIC_OR_DEPARTMENT_OTHER): Payer: Self-pay | Admitting: Pharmacist

## 2022-06-02 ENCOUNTER — Inpatient Hospital Stay: Payer: Commercial Managed Care - PPO | Attending: Hematology and Oncology | Admitting: Hematology and Oncology

## 2022-06-02 ENCOUNTER — Encounter: Payer: Self-pay | Admitting: Hematology and Oncology

## 2022-06-02 ENCOUNTER — Encounter: Payer: Self-pay | Admitting: Oncology

## 2022-06-02 VITALS — BP 136/78 | HR 89 | Temp 98.8°F | Resp 16 | Ht 63.0 in | Wt 160.0 lb

## 2022-06-02 DIAGNOSIS — Z79810 Long term (current) use of selective estrogen receptor modulators (SERMs): Secondary | ICD-10-CM | POA: Diagnosis not present

## 2022-06-02 DIAGNOSIS — D0512 Intraductal carcinoma in situ of left breast: Secondary | ICD-10-CM | POA: Insufficient documentation

## 2022-06-02 DIAGNOSIS — C50412 Malignant neoplasm of upper-outer quadrant of left female breast: Secondary | ICD-10-CM

## 2022-06-02 DIAGNOSIS — Z79899 Other long term (current) drug therapy: Secondary | ICD-10-CM | POA: Diagnosis not present

## 2022-06-02 DIAGNOSIS — Z923 Personal history of irradiation: Secondary | ICD-10-CM | POA: Diagnosis not present

## 2022-06-02 DIAGNOSIS — Z170 Estrogen receptor positive status [ER+]: Secondary | ICD-10-CM | POA: Diagnosis not present

## 2022-06-02 DIAGNOSIS — R635 Abnormal weight gain: Secondary | ICD-10-CM | POA: Diagnosis not present

## 2022-06-02 NOTE — Progress Notes (Signed)
[place]  [phone] [phone]) [phone]     ID: DOROTEA HAND DOB: 12-May-1965  MR#: [id number]  YCX#:[id number]  Patient Care Team: [name], MD as PCP - General (Family Medicine) Kennith Center, RD as Dietitian (Family Medicine) Donnamae Jude, MD as Consulting Physician (Obstetrics and Gynecology) [name], MD as Consulting Physician (General Surgery) Eppie Gibson, MD as Attending Physician (Radiation Oncology) Benay Pike, MD as Consulting Physician (Hematology and Oncology) Benay Pike, MD  CHIEF COMPLAINT: Ductal carcinoma in situ  CURRENT TREATMENT: tamoxifen   INTERVAL HISTORY:  Kirstine "[name]" returns today for follow up of her noninvasive breast cancer. She denies any episodes of cellulitis since Aug 2023. Last mammogram in September 2023, BI-RADS Category 1, negative for malignancy. Bone density Sep 2023 normal. Rest of the pertinent 10 point ROS reviewed and neg.  HISTORY OF CURRENT ILLNESS: From the original intake note:  KERA DEACON has a history of left breast lumpectomy ([id number]) performed on 08/15/2015 for fibroscystic changes with calcifications. She underwent a second left breast lumpectomy on 12/30/2016 [id number]) for lobular neoplasia, complex sclerosing lesion, and fibrocystic change and sclerosing adenosis with calcifications.  She had routine screening mammography on 12/14/2017 showing a possible abnormality in the left breast. She underwent left diagnostic mammogram on 01/27/2018, which showed: breast density category C; probably benign left breast calcifications, most likely fat necrosis (measurements not given). Short term follow up was recommended.  She returned for follow up on 09/29/2018 (likely delayed due to the pandemic) and underwent diagnostic left mammogram. This showed: breast density category C; unchanged 1.5 cm group of calcifications at the lumpectomy site; 1.2 cm group of calcifications  within the anterior upper-outer left breast. Short term follow up was again recommended.  She bilateral diagnostic mammography with tomography at [facility] on 01/03/2019 showing: breast density category C; stable probably benign calcifications at the left lumpectomy site; 1.5 cm grouped calcifications within the upper-outer quadrant of the left breast with suspicious morphology and distribution; no evidence of right breast malignancy.   Accordingly on 01/06/2019 she proceeded to biopsy of the left breast area in question. The pathology from this procedure ([id number]) showed: ductal carcinoma in situ, high grade. Prognostic indicators significant for: estrogen receptor, 100% positive and progesterone receptor, 80% positive, both with strong staining intensity.   She underwent genetic counseling on 01/16/2019, which showed negative results.  She also underwent biopsy of the calcifications at the left lumpectomy site on 01/17/2019. Pathology [id number]) showed: focal fibrocystic changes; dense fibrosis with pigmented histiocytes and foreign body giant cells.  She opted to proceed with left breast lumpectomy on 02/09/2019 under Dr. Donne Hazel. Pathology from the procedure ([id number]) showed:  1. Left Breast, lumpectomy  - fibrocystic changes 2. Left Breast, lumpectomy  - ductal carcinoma in situ with calcifications, intermediate grade, spanning 2.4 cm  - lobular neoplasia  -negative resection margins   The patient's subsequent history is as detailed below.   PAST MEDICAL HISTORY: Past Medical History:  Diagnosis Date   Anxiety    Breast mass, left    Cancer ([place])    Complication of anesthesia    felt lethargic x 4 days following last surgery in 2017, but 2018 did okay   Depression    Headache    migraines   History of radiation therapy 03/13/19- 04/11/19   Left Breast 16 fraction of 2. 66 Gy each to total 42.56 Gy. Left breast boost 4 fractions of 2 Gy to total 8 Gy.  Osteopenia     PAST SURGICAL HISTORY: Past Surgical History:  Procedure Laterality Date   BREAST BIOPSY Left 07/10/2015   high risk stereo   BREAST EXCISIONAL BIOPSY Left 08/15/2015   ADH   BREAST LUMPECTOMY Left 02/09/2019   BREAST LUMPECTOMY WITH RADIOACTIVE SEED LOCALIZATION Left 08/15/2015   Procedure:  LEFT BREAST LUMPECTOMY WITH RADIOACTIVE SEED LOCALIZATION;  Surgeon: Erroll Luna, MD;  Location: [place];  Service: General;  Laterality: Left;   BREAST LUMPECTOMY WITH RADIOACTIVE SEED LOCALIZATION Left 02/09/2019   Procedure: LEFT BREAST LUMPECTOMY WITH RADIOACTIVE SEED LOCALIZATION;  Surgeon: [name], MD;  Location: [place];  Service: General;  Laterality: Left;   EXCISION OF BREAST BIOPSY Left 12/30/2016   Benign ALH sclerosing lesion   RADIOACTIVE SEED GUIDED EXCISIONAL BREAST BIOPSY Left 12/30/2016   Procedure: RADIOACTIVE SEED GUIDED EXCISIONAL LEFT BREAST BIOPSY;  Surgeon: [name], MD;  Location: [place];  Service: General;  Laterality: Left;   RADIOACTIVE SEED GUIDED EXCISIONAL BREAST BIOPSY Left 02/09/2019   Procedure: RADIOACTIVE SEED GUIDED EXCISIONAL LEFT  BREAST BIOPSY;  Surgeon: [name], MD;  Location: [place];  Service: General;  Laterality: Left;   TONSILLECTOMY  Age 31   TRIGGER FINGER RELEASE  2010   Right Thumb    FAMILY HISTORY: Family History  Problem Relation Age of Onset   Depression Brother    Atrial fibrillation Brother    Hypertension Mother    Osteoporosis Mother    ADD / ADHD Son    ADD / ADHD Daughter    Diabetes Maternal Grandmother    Diabetes Maternal Grandfather    Diabetes Paternal Grandmother    Diabetes Paternal Grandfather    Parkinson's disease Maternal Aunt   Patient's father was 31 years old when he died from complications of cardiomyopathy.  He was adopted and there is no family history on his side. Patient's mother is 47 years old as of November 2020. The patient  has 3 brothers, no sisters.  She denies/or notes a family hx of breast, prostate, pancreatic or ovarian cancer.    GYNECOLOGIC HISTORY:  Patient's last menstrual period was 01/25/2014 (approximate). Menarche: 57 years old Age at first live birth: Any 57 years old GX P 2 LMP 49 Contraceptive oral contraceptives at least 7 years, with no complications HRT 9 months, discontinued because of malaise  Hysterectomy? no BSO?  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  She is currently doing preadmissions at the new [facility].  Her husband Nicole Kindred works in Radio producer..  Daughter Apolonio Schneiders, 23, lives in [place] and works as a Immunologist.  Son [place], [place], with Asperger's, currently attends [employer], and lives at home with the patient.     ADVANCED DIRECTIVES: In the absence of any documents to the contrary the patient's husband is her healthcare power of attorney   HEALTH MAINTENANCE: Social History   Tobacco Use   Smoking status: Never   Smokeless tobacco: Never  Vaping Use   Vaping Use: Never used  Substance Use Topics   Alcohol use: No    Comment: rare use   Drug use: No     Colonoscopy: not on file  PAP: 08/2017, negative  Bone density: 11/2017, -2.0   Allergies  Allergen Reactions   Wellbutrin Xl [Bupropion] Hives    Current Outpatient Medications  Medication Sig Dispense Refill  Biotin w/ Vitamins C & E (HAIR/SKIN/NAILS PO) Take 1 tablet by mouth daily.      Calcium Carb-Cholecalciferol (CALCIUM + D3 PO) Take 1 tablet by mouth daily.      calcium carbonate (OS-CAL) 1250 (500 Ca) MG chewable tablet Chew 1 tablet by mouth daily.     doxycycline (VIBRA-TABS) 100 MG tablet Take 1 tablet (100 mg total) by mouth 2 (two) times daily. 20 tablet 0   estradiol (ESTRING) 2 MG vaginal ring Place 2 mg vaginally every 3 (three) months. follow package directions 1 each 12   gabapentin  (NEURONTIN) 300 MG capsule TAKE 1 CAPSULE BY MOUTH AT BEDTIME 90 capsule 4   tamoxifen (NOLVADEX) 20 MG tablet Take 1 tablet (20 mg total) by mouth daily. 30 tablet 11   triamcinolone cream (KENALOG) 0.1 % Apply to affected area 2 (two) times daily. 30 g 0   venlafaxine XR (EFFEXOR-XR) 75 MG 24 hr capsule Take 1 capsule (75 mg total) by mouth daily with breakfast. 90 capsule 1   zolpidem (AMBIEN) 5 MG tablet Take 1 tablet (5 mg total) by mouth at bedtime as needed for sleep. 2 tablet 0   No current facility-administered medications for this visit.   Facility-Administered Medications Ordered in Other Visits  Medication Dose Route Frequency Provider Last Rate Last Admin   0.9 %  sodium chloride infusion   Intravenous Continuous Magrinat, Virgie Dad, MD 20 mL/hr at 02/06/21 1648 Infusion Verify at 02/06/21 1648    OBJECTIVE: White woman who appears younger than stated age There were no vitals filed for this visit.    There is no height or weight on file to calculate BMI.   Wt Readings from Last 3 Encounters:  11/11/21 154 lb (69.9 kg)  08/19/21 159 lb (72.1 kg)  07/28/21 155 lb 12.8 oz (70.7 kg)     ECOG FS:1  Sclerae unicteric, EOMs intact Wearing a mask No cervical or supraclavicular adenopathy Lungs no rales or rhonchi Heart regular rate and rhythm Abd soft, nontender, positive bowel sounds MSK no focal spinal tenderness, no upper extremity lymphedema Neuro: nonfocal, well oriented, appropriate affect Breasts: Bilateral breasts inspected. Left breast upper outer quadrant surgical changes, no palpable masses or regional adenopathy. Right breast inspected, normal on palpation  LAB RESULTS:  CMP     Component Value Date/Time   NA 141 05/29/2021 1430   NA 142 10/24/2019 1116   K 3.7 05/29/2021 1430   CL 106 05/29/2021 1430   CO2 29 05/29/2021 1430   GLUCOSE 87 05/29/2021 1430   BUN 23 (H) 05/29/2021 1430   BUN 11 10/24/2019 1116   CREATININE 0.75 05/29/2021 1430   CALCIUM 9.1  05/29/2021 1430   PROT 7.1 05/29/2021 1430   PROT 7.1 10/24/2019 1116   ALBUMIN 4.1 05/29/2021 1430   ALBUMIN 4.6 10/24/2019 1116   AST 17 05/29/2021 1430   ALT 9 05/29/2021 1430   ALKPHOS 44 05/29/2021 1430   BILITOT 0.2 (L) 05/29/2021 1430   GFRNONAA >60 05/29/2021 1430   GFRAA >60 01/24/2020 1033    No results found for: "TOTALPROTELP", "ALBUMINELP", "A1GS", "A2GS", "BETS", "BETA2SER", "GAMS", "MSPIKE", "SPEI"  No results found for: "KPAFRELGTCHN", "LAMBDASER", "KAPLAMBRATIO"  Lab Results  Component Value Date   WBC 10.2 05/29/2021   NEUTROABS 7.9 (H) 05/29/2021   HGB 11.8 (L) 05/29/2021   HCT 35.2 (L) 05/29/2021   MCV 91.0 05/29/2021   PLT 338 05/29/2021   No results found for: "LABCA2"  No components found for: "VVOHYW737"  No results for input(s): "INR" in the last 168 hours.  No results found for: "LABCA2"  No results found for: "BPZ025"  No results found for: "CAN125"  No results found for: "CAN153"  No results found for: "CA2729"  No components found for: "HGQUANT"  No results found for: "CEA1", "CEA" / No results found for: "CEA1", "CEA"   No results found for: "AFPTUMOR"  No results found for: "CHROMOGRNA"  No results found for: "HGBA", "HGBA2QUANT", "HGBFQUANT", "HGBSQUAN" (Hemoglobinopathy evaluation)   No results found for: "LDH"  No results found for: "IRON", "TIBC", "IRONPCTSAT" (Iron and TIBC)  No results found for: "FERRITIN"  Urinalysis    Component Value Date/Time   LABSPEC 1.025 09/09/2009 1037   PHURINE 6.0 09/09/2009 1037   GLUCOSEU NEGATIVE 09/09/2009 1037   HGBUR TRACE (A) 09/09/2009 [place] 09/09/2009 [place] 09/09/2009 1037   PROTEINUR NEGATIVE 09/09/2009 1037   UROBILINOGEN 0.2 09/09/2009 1037   NITRITE NEGATIVE 09/09/2009 1037   LEUKOCYTESUR  09/09/2009 1037    NEGATIVE Biochemical Testing Only. Please order routine urinalysis from main lab if confirmatory testing is needed.     STUDIES: No results found.    ELIGIBLE FOR AVAILABLE RESEARCH PROTOCOL: no  ASSESSMENT: 57 y.o. [place] woman status post left breast biopsy 01/06/2019 ductal carcinoma in situ, grade 3, estrogen and progesterone receptor positive  (a) biopsy of a second area of calcifications in the left breast on 01/17/2019 -  (1) status post left lumpectomy 02/09/2019 2.4 cm ductal carcinoma in situ, grade 2, with negative margins  (2) adjuvant radiation 03/13/2019 - 04/11/2019  (a) left breast / 42.56 Gy in 16 fractions  (b) boost / 8 Gy in 4 fractions  (3) tamoxifen started 04/28/2019  (4) genetics testing 01/19/2019 through the [facility] Breast Cancer STAT Panel + Common Hereditary Cancers Panel found no deleterious mutations in ATM, BRCA1, BRCA2, CDH1, CHEK2, PALB2, PTEN, STK11 and TP53 // APC, ATM, AXIN2, BARD1, BMPR1A, BRCA1, BRCA2, BRIP1, CDH1, CDKN2A (p14ARF), CDKN2A (p16INK4a), CKD4, CHEK2, CTNNA1, DICER1, EPCAM (Deletion/duplication testing only), GREM1 (promoter region deletion/duplication testing only), KIT, MEN1, MLH1, MSH2, MSH3, MSH6, MUTYH, NBN, NF1, NHTL1, PALB2, PDGFRA, PMS2, POLD1, POLE, PTEN, RAD50, RAD51C, RAD51D, SDHB, SDHC, SDHD, SMAD4, SMARCA4. STK11, TP53, TSC1, TSC2, and VHL.  The following genes were evaluated for sequence changes only: SDHA and HOXB13 c.251G>A variant only.  (5) left breast cellulitis October 2022, requiring intravenous vancomycin, completed November 2022.  PLAN:  This is a pleasant 57 year old female patient with a left breast DCIS ER/PR positive status post left lumpectomy and adjuvant radiation in 2020, adjuvant tamoxifen started on April 28, 2019, currently compliant with tamoxifen without any adverse effects who is here for follow-up. She is taking Tamoxifen as prescribed. Gabapentin with effexor is helping with hot flashes. No concerns on PE today. Pt would like diagnostic mammogram for this yr screening, she understands insurance might deny it,  ordered. We also discussed about cardiovascular risk factors, how to decrease heart disease and she wants to consider cardiac screening given family history of cardiac disease. RTC in one yr.  Total time spent: 30 min  *Total Encounter Time as defined by the Centers for Medicare and Medicaid Services includes, in addition to the face-to-face time of a patient visit (documented in the note above) non-face-to-face time: obtaining and reviewing outside history, ordering and reviewing medications, tests or procedures, care coordination (communications with other health care professionals or caregivers) and documentation in the medical record.

## 2022-06-22 ENCOUNTER — Other Ambulatory Visit (HOSPITAL_BASED_OUTPATIENT_CLINIC_OR_DEPARTMENT_OTHER): Payer: Self-pay

## 2022-06-25 ENCOUNTER — Encounter: Payer: Self-pay | Admitting: Hematology and Oncology

## 2022-06-26 ENCOUNTER — Other Ambulatory Visit: Payer: Self-pay

## 2022-06-26 ENCOUNTER — Inpatient Hospital Stay: Payer: Commercial Managed Care - PPO | Attending: Hematology and Oncology | Admitting: Physician Assistant

## 2022-06-26 ENCOUNTER — Other Ambulatory Visit (HOSPITAL_BASED_OUTPATIENT_CLINIC_OR_DEPARTMENT_OTHER): Payer: Self-pay

## 2022-06-26 ENCOUNTER — Telehealth: Payer: Self-pay

## 2022-06-26 ENCOUNTER — Encounter (INDEPENDENT_AMBULATORY_CARE_PROVIDER_SITE_OTHER): Payer: Commercial Managed Care - PPO

## 2022-06-26 VITALS — BP 115/80 | HR 96 | Temp 99.1°F | Resp 18

## 2022-06-26 DIAGNOSIS — N61 Mastitis without abscess: Secondary | ICD-10-CM

## 2022-06-26 DIAGNOSIS — D0512 Intraductal carcinoma in situ of left breast: Secondary | ICD-10-CM | POA: Insufficient documentation

## 2022-06-26 MED ORDER — DOXYCYCLINE HYCLATE 100 MG PO TABS
100.0000 mg | ORAL_TABLET | Freq: Two times a day (BID) | ORAL | 0 refills | Status: AC
  Filled 2022-06-26: qty 14, 7d supply, fill #0

## 2022-06-26 NOTE — Progress Notes (Signed)
Symptom Management Consult Note [place]    Patient Care Team: [name], MD as PCP - General (Family Medicine) Kennith Center, RD as Dietitian (Family Medicine) Donnamae Jude, MD as Consulting Physician (Obstetrics and Gynecology) [name], MD as Consulting Physician (General Surgery) Eppie Gibson, MD as Attending Physician (Radiation Oncology) Benay Pike, MD as Consulting Physician (Hematology and Oncology) Mignon Pine, DO as Consulting Physician (Infectious Diseases)    Name / MRN / DOB: Stephanie [name]  [id number]  [birth year]   Date of visit: 06/26/2022   Chief Complaint/Reason for visit: Breast cellulitis   Current Therapy: Tamoxifen    ASSESSMENT & PLAN: Patient is a 57 y.o. 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 Chryl Heck.  I have viewed most recent oncology note and lab work.    #Ductal carcinoma in situ  - Recently saw oncologist for yearly visit on 06/02/22 - Next appointment with oncologist is 06/03/23   #Breast cellulitis - Saw ID for left breast cellulitis on 06/04/21 and was given an on demand prescription for cefadroxil. - Chart review shows patient had recurrent cellulitis from August-November 2022 and was treated with multiple antibiotics. -On exam today patient is well-appearing.  Vital signs do not indicate sepsis.  Exam shows left axillary edema without palpable lymphadenopathy.  She has very faint redness over her left breast.  No warmth. -Patient has finished 4 days of cefadroxil without much symptom improvement.  Will add prescription for doxycycline to add in MRSA coverage. Patient does not need IV antibiotics at this time as she is still on first course of PO. Has not technically failed PO yet. -Engage in shared decision making with patient regarding lab work today.  As she has normal vital signs and has been on antibiotics for several days now, and elevated white count  would not change management therefore we will hold off on getting it today. -Patient is scheduled to follow-up with ID next week.  Strict ED precautions discussed should symptoms worsen.   Heme/Onc History: Oncology History  Malignant neoplasm of upper-outer quadrant of left breast in female, estrogen receptor positive ([place])  01/06/2019 Cancer Staging   Staging form: Breast, AJCC 8th Edition - Clinical stage from 01/06/2019: Stage 0 (cTis (DCIS), cN0, cM0, ER+, PR+) - Signed by Gardenia Phlegm, NP on 01/18/2019   01/16/2019 Initial Diagnosis   Malignant neoplasm of upper-outer quadrant of left breast in female, estrogen receptor positive ([place])   01/19/2019 Genetic Testing   Negative genetic testing. No pathogenic variants identified on the [facility] Breast Cancer STAT Panel + Common Hereditary Cancers Panel. The STAT Breast cancer panel offered by [facility] includes sequencing and rearrangement analysis for the following 9 genes:  ATM, BRCA1, BRCA2, CDH1, CHEK2, PALB2, PTEN, STK11 and TP53.  The Common Hereditary Cancers Panel offered by [facility] includes sequencing and/or deletion duplication testing of the following 47 genes: APC, ATM, AXIN2, BARD1, BMPR1A, BRCA1, BRCA2, BRIP1, CDH1, CDKN2A (p14ARF), CDKN2A (p16INK4a), CKD4, CHEK2, CTNNA1, DICER1, EPCAM (Deletion/duplication testing only), GREM1 (promoter region deletion/duplication testing only), KIT, MEN1, MLH1, MSH2, MSH3, MSH6, MUTYH, NBN, NF1, NHTL1, PALB2, PDGFRA, PMS2, POLD1, POLE, PTEN, RAD50, RAD51C, RAD51D, SDHB, SDHC, SDHD, SMAD4, SMARCA4. STK11, TP53, TSC1, TSC2, and VHL.  The following genes were evaluated for sequence changes only: SDHA and HOXB13 c.251G>A variant only. The report date is 01/19/2019.    02/09/2019 Cancer Staging   Staging form: Breast, AJCC 8th Edition - Pathologic stage  from 02/09/2019: Stage 0 (pTis (DCIS), pN0, cM0, ER+, PR+) - Signed by Gardenia Phlegm, NP on 03/01/2019       Interval history-:  Stephanie [name] is a 57 y.o. female with oncologic history as above presenting to [facility] today with chief complaint of left breast cellulitis x 4 days. She presents unaccompanied to clinic.   Patient states the first symptom she noticed was swelling in her left axillary region.  When she first noticed the swelling she then recalled that her energy level has been decreased over the last week.  This has happened in the past with previous infections.  She has an on-demand prescription for cefadroxil which she went ahead and called in.  She finished day 4 of antibiotic this morning.  Typically by this time she will notice symptom improvement however she is concerned as that is not happening now.  Her energy levels have returned to almost normal however she continues to have the swelling in her left axillary region. She reports if she is walking and swinging her arms she will occasionally feel soreness in her left underarm.  This morning she noticed the skin of her left breast was pink in color. She admits to breast swelling as well without breast pain. No nipple changes or discharge. She has been monitoring temperature at home and t-max has been 99.7. She has not taken any OTC medication for her symptoms prior to arrival. She denies chills.  Patient struggled with frequent infections from August-November 2022 and was treated with multiple rounds of different antibiotics.  She states she has not had any breast infections since July 2023.  She has stopped shaving or applying deodorant to her left underarm.  She cannot think of a specific trigger this time that caused infection to develop. She denies any chills. She plans to follow up with Infectious Disease although wanted to have an exam today to ensure there was no concerning symptoms that would warrant aggressive measures at this time.    ROS  All other systems are reviewed and are negative for acute change except as noted in the HPI.    Allergies  Allergen  Reactions   Wellbutrin Xl [Bupropion] Hives     Past Medical History:  Diagnosis Date   Anxiety    Breast mass, left    Cancer ([place])    Complication of anesthesia    felt lethargic x 4 days following last surgery in 2017, but 2018 did okay   Depression    Headache    migraines   History of radiation therapy 03/13/19- 04/11/19   Left Breast 16 fraction of 2. 66 Gy each to total 42.56 Gy. Left breast boost 4 fractions of 2 Gy to total 8 Gy.    Osteopenia      Past Surgical History:  Procedure Laterality Date   BREAST BIOPSY Left 07/10/2015   high risk stereo   BREAST EXCISIONAL BIOPSY Left 08/15/2015   ADH   BREAST LUMPECTOMY Left 02/09/2019   BREAST LUMPECTOMY WITH RADIOACTIVE SEED LOCALIZATION Left 08/15/2015   Procedure:  LEFT BREAST LUMPECTOMY WITH RADIOACTIVE SEED LOCALIZATION;  Surgeon: Erroll Luna, MD;  Location: [place];  Service: General;  Laterality: Left;   BREAST LUMPECTOMY WITH RADIOACTIVE SEED LOCALIZATION Left 02/09/2019   Procedure: LEFT BREAST LUMPECTOMY WITH RADIOACTIVE SEED LOCALIZATION;  Surgeon: [name], MD;  Location: [place];  Service: General;  Laterality: Left;   EXCISION OF BREAST BIOPSY Left 12/30/2016   Benign ALH sclerosing lesion  RADIOACTIVE SEED GUIDED EXCISIONAL BREAST BIOPSY Left 12/30/2016   Procedure: RADIOACTIVE SEED GUIDED EXCISIONAL LEFT BREAST BIOPSY;  Surgeon: [name], MD;  Location: [place];  Service: General;  Laterality: Left;   RADIOACTIVE SEED GUIDED EXCISIONAL BREAST BIOPSY Left 02/09/2019   Procedure: RADIOACTIVE SEED GUIDED EXCISIONAL LEFT  BREAST BIOPSY;  Surgeon: [name], MD;  Location: [place];  Service: General;  Laterality: Left;   TONSILLECTOMY  Age 77   TRIGGER FINGER RELEASE  2010   Right Thumb    Social History   Socioeconomic History   Marital status: Married    Spouse name: Not on file   Number of children: Not on file   Years of education: Not  on file   Highest education level: Not on file  Occupational History   Occupation: RN    Employer: [employer]  Tobacco Use   Smoking status: Never   Smokeless tobacco: Never  Vaping Use   Vaping Use: Never used  Substance and Sexual Activity   Alcohol use: No    Comment: rare use   Drug use: No   Sexual activity: Yes    Partners: Male    Birth control/protection: Post-menopausal  Other Topics Concern   Not on file  Social History Narrative   04/05/12 AM he was born and grew up in [place]. She has 3 older brothers. She never suffered any abuse. Her father died when she was 77 years of age. She completed her bachelor of science in nursing in 1989, and has been working as an Therapist, sports . She has been married for 21 years, and she and her husband have a 69 year old son and a 13 year old daughter. She affiliates as Psychologist, forensic. She denies any legal involvement. She enjoys hiking and reading. She reports her social support consists of friends from college, and a friend from church. 04/05/2012   Social Determinants of Health   Financial Resource Strain: Not on file  Food Insecurity: Not on file  Transportation Needs: No Transportation Needs (03/03/2019)   PRAPARE - Hydrologist (Medical): No    Lack of Transportation (Non-Medical): No  Physical Activity: Not on file  Stress: Not on file  Social Connections: Not on file  Intimate Partner Violence: Not At Risk (03/03/2019)   Humiliation, Afraid, Rape, and Kick questionnaire    Fear of Current or Ex-Partner: No    Emotionally Abused: No    Physically Abused: No    Sexually Abused: No    Family History  Problem Relation Age of Onset   Hypertension Mother    Osteoporosis Mother    Depression Brother    Atrial fibrillation Brother    Coronary artery disease Brother    Coronary artery disease Brother    Atrial fibrillation Brother    Parkinson's disease Maternal Aunt    Diabetes Maternal  Grandmother    Diabetes Maternal Grandfather    Diabetes Paternal Grandmother    Diabetes Paternal Grandfather    ADD / ADHD Daughter    ADD / ADHD Son      Current Outpatient Medications:    doxycycline (VIBRA-TABS) 100 MG tablet, Take 1 tablet (100 mg total) by mouth 2 (two) times daily for 7 days., Disp: 14 tablet, Rfl: 0   Biotin w/ Vitamins C & E (HAIR/SKIN/NAILS PO), Take 1 tablet by mouth daily. , Disp: , Rfl:    Calcium Carb-Cholecalciferol (CALCIUM + D3 PO), Take 1 tablet by mouth daily. , Disp: ,  Rfl:    calcium carbonate (OS-CAL) 1250 (500 Ca) MG chewable tablet, Chew 1 tablet by mouth daily., Disp: , Rfl:    cefadroxil (DURICEF) 500 MG capsule, Take 2 capsules (1,000 mg total) by mouth 2 (two) times daily for 10 days., Disp: 40 capsule, Rfl: 0   estradiol (ESTRING) 2 MG vaginal ring, Place 2 mg vaginally every 3 (three) months. follow package directions, Disp: 1 each, Rfl: 12   gabapentin (NEURONTIN) 300 MG capsule, TAKE 1 CAPSULE BY MOUTH AT BEDTIME, Disp: 90 capsule, Rfl: 4   tamoxifen (NOLVADEX) 20 MG tablet, Take 1 tablet (20 mg total) by mouth daily., Disp: 30 tablet, Rfl: 11   triamcinolone cream (KENALOG) 0.1 %, Apply to affected area 2 (two) times daily., Disp: 30 g, Rfl: 0   venlafaxine XR (EFFEXOR-XR) 75 MG 24 hr capsule, Take 1 capsule (75 mg total) by mouth daily with breakfast., Disp: 90 capsule, Rfl: 1   zolpidem (AMBIEN) 5 MG tablet, Take 1 tablet (5 mg total) by mouth at bedtime as needed for sleep., Disp: 2 tablet, Rfl: 0 No current facility-administered medications for this visit.  Facility-Administered Medications Ordered in Other Visits:    0.9 %  sodium chloride infusion, , Intravenous, Continuous, Magrinat, Virgie Dad, MD, Last Rate: 20 mL/hr at 02/06/21 1648, Infusion Verify at 02/06/21 1648  PHYSICAL EXAM: ECOG FS:1 - Symptomatic but completely ambulatory    Vitals:   06/26/22 1334  BP: 115/80  Pulse: 96  Resp: 18  Temp: 99.1 F (37.3 C)   TempSrc: Oral  SpO2: 97%   Physical Exam Vitals and nursing note reviewed.  Constitutional:      Appearance: She is well-developed. She is not ill-appearing or toxic-appearing.  HENT:     Head: Normocephalic.     Nose: Nose normal.  Eyes:     Conjunctiva/sclera: Conjunctivae normal.  Neck:     Vascular: No JVD.  Cardiovascular:     Rate and Rhythm: Normal rate.     Pulses: Normal pulses.  Pulmonary:     Effort: Pulmonary effort is normal.  Chest:     Comments: Left breast with faint pink coloring. No warmth. No tenderness Abdominal:     General: There is no distension.  Musculoskeletal:     Cervical back: Normal range of motion.     Comments: Edema of left under arm without tenderness. No erythema or warmth.  Compartments in left upper extremity are soft.  Lymphadenopathy:     Upper Body:     Left upper body: No axillary adenopathy.  Skin:    General: Skin is warm and dry.  Neurological:     Mental Status: She is oriented to person, place, and time.        LABORATORY DATA: I have reviewed the data as listed    Latest Ref Rng & Units 05/29/2021    2:30 PM 03/12/2021    9:37 AM 02/24/2021    1:36 PM  CBC  WBC 4.0 - 10.5 K/uL 10.2  9.2  9.9   Hemoglobin 12.0 - 15.0 g/dL 11.8  12.8  11.5   Hematocrit 36.0 - 46.0 % 35.2  38.8  32.7   Platelets 150 - 400 K/uL 338  439  318         Latest Ref Rng & Units 05/29/2021    2:30 PM 03/12/2021    9:37 AM 02/24/2021    1:36 PM  CMP  Glucose 70 - 99 mg/dL 87  147  93  BUN 6 - 20 mg/dL '23  31  17   '$ Creatinine 0.44 - 1.00 mg/dL 0.75  1.25  0.70   Sodium 135 - 145 mmol/L 141  139  138   Potassium 3.5 - 5.1 mmol/L 3.7  3.9  3.6   Chloride 98 - 111 mmol/L 106  101  102   CO2 22 - 32 mmol/L '29  26  27   '$ Calcium 8.9 - 10.3 mg/dL 9.1  10.1  8.9   Total Protein 6.5 - 8.1 g/dL 7.1  7.7  6.8   Total Bilirubin 0.3 - 1.2 mg/dL 0.2  <0.2  0.4   Alkaline Phos 38 - 126 U/L 44  99  56   AST 15 - 41 U/L 17  40  43   ALT 0 - 44  U/L 9  83  47        RADIOGRAPHIC STUDIES (from last 24 hours if applicable) I have personally reviewed the radiological images as listed and agreed with the findings in the report. No results found.      Visit Diagnosis: 1. Cellulitis of left breast   2. Ductal carcinoma in situ (DCIS) of left breast      No orders of the defined types were placed in this encounter.   All questions were answered. The patient knows to call the clinic with any problems, questions or concerns. No barriers to learning was detected.  I have spent a total of 30 minutes minutes of face-to-face and non-face-to-face time, preparing to see the patient, obtaining and/or reviewing separately obtained history, performing a medically appropriate examination, counseling and educating the patient, ordering medications, documenting clinical information in the electronic health record.  Thank you for allowing me to participate in the care of this patient.    Barrie Folk, PA-C Department of Hematology/Oncology [facility] Phone: [phone]  [phone]    06/26/2022 3:52 PM

## 2022-06-26 NOTE — Telephone Encounter (Signed)
Called Pt regarding [facility] appt for possible cellulitis ok'd by Anda Kraft. Pt agreeable to 1300 appt. Pt also instructed to make appt for next week with infectious disease. Pt verbalized understanding.

## 2022-06-27 ENCOUNTER — Encounter: Payer: Self-pay | Admitting: Hematology and Oncology

## 2022-06-29 ENCOUNTER — Other Ambulatory Visit: Payer: Self-pay | Admitting: *Deleted

## 2022-06-29 ENCOUNTER — Encounter: Payer: Self-pay | Admitting: Oncology

## 2022-06-29 ENCOUNTER — Encounter: Payer: Self-pay | Admitting: *Deleted

## 2022-06-29 DIAGNOSIS — Z170 Estrogen receptor positive status [ER+]: Secondary | ICD-10-CM

## 2022-06-29 DIAGNOSIS — R234 Changes in skin texture: Secondary | ICD-10-CM

## 2022-06-30 ENCOUNTER — Ambulatory Visit: Payer: Commercial Managed Care - PPO | Admitting: Infectious Diseases

## 2022-06-30 ENCOUNTER — Other Ambulatory Visit (HOSPITAL_BASED_OUTPATIENT_CLINIC_OR_DEPARTMENT_OTHER): Payer: Self-pay

## 2022-06-30 MED ORDER — CEFADROXIL 500 MG PO CAPS
1000.0000 mg | ORAL_CAPSULE | Freq: Two times a day (BID) | ORAL | 2 refills | Status: DC
  Filled 2022-06-30: qty 40, 10d supply, fill #0

## 2022-06-30 NOTE — Telephone Encounter (Signed)
Please see the MyChart message reply(ies) for my assessment and plan.    This patient gave consent for this Medical Advice Message and is aware that it may result in a bill to [employer], as well as the possibility of receiving a bill for a co-payment or deductible. They are an established patient, but are not seeking medical advice exclusively about a problem treated during an in person or video visit in the last seven days. I did not recommend an in person or video visit within seven days of my reply.    I spent a total of 20 minutes cumulative time within 7 days through [employer].  Problem List Items Addressed This Visit       Unprioritized   Cellulitis of left breast   Relevant Medications   cefadroxil (DURICEF) 500 MG capsule     Janene Madeira, NP

## 2022-07-02 ENCOUNTER — Other Ambulatory Visit: Payer: Self-pay | Admitting: Adult Health

## 2022-07-02 DIAGNOSIS — Z170 Estrogen receptor positive status [ER+]: Secondary | ICD-10-CM

## 2022-07-02 DIAGNOSIS — R234 Changes in skin texture: Secondary | ICD-10-CM

## 2022-07-15 ENCOUNTER — Ambulatory Visit
Admission: RE | Admit: 2022-07-15 | Discharge: 2022-07-15 | Disposition: A | Discharge status: Home / Self Care | Payer: Commercial Managed Care - PPO | Source: Ambulatory Visit | Attending: Adult Health | Admitting: Adult Health

## 2022-07-15 ENCOUNTER — Ambulatory Visit: Payer: Commercial Managed Care - PPO

## 2022-07-15 DIAGNOSIS — R922 Inconclusive mammogram: Secondary | ICD-10-CM | POA: Diagnosis not present

## 2022-07-15 DIAGNOSIS — Z170 Estrogen receptor positive status [ER+]: Secondary | ICD-10-CM

## 2022-07-15 DIAGNOSIS — R234 Changes in skin texture: Secondary | ICD-10-CM

## 2022-07-15 DIAGNOSIS — Z853 Personal history of malignant neoplasm of breast: Secondary | ICD-10-CM | POA: Diagnosis not present

## 2022-08-04 ENCOUNTER — Encounter: Payer: Self-pay | Admitting: Oncology

## 2022-08-04 ENCOUNTER — Other Ambulatory Visit: Payer: Self-pay

## 2022-08-28 ENCOUNTER — Encounter: Payer: Self-pay | Admitting: Family Medicine

## 2022-08-28 DIAGNOSIS — Z8249 Family history of ischemic heart disease and other diseases of the circulatory system: Secondary | ICD-10-CM

## 2022-09-24 ENCOUNTER — Encounter: Payer: Self-pay | Admitting: Family Medicine

## 2022-10-01 ENCOUNTER — Encounter (INDEPENDENT_AMBULATORY_CARE_PROVIDER_SITE_OTHER): Payer: Commercial Managed Care - PPO

## 2022-10-01 DIAGNOSIS — N61 Mastitis without abscess: Secondary | ICD-10-CM | POA: Diagnosis not present

## 2022-10-05 ENCOUNTER — Other Ambulatory Visit (HOSPITAL_BASED_OUTPATIENT_CLINIC_OR_DEPARTMENT_OTHER): Payer: Self-pay

## 2022-10-05 MED ORDER — CEFADROXIL 500 MG PO CAPS
1000.0000 mg | ORAL_CAPSULE | Freq: Two times a day (BID) | ORAL | 3 refills | Status: DC
  Filled 2022-10-05: qty 56, 14d supply, fill #0
  Filled 2022-11-10 – 2023-04-15 (×2): qty 56, 14d supply, fill #1
  Filled 2023-08-12: qty 56, 14d supply, fill #2

## 2022-10-05 NOTE — Telephone Encounter (Signed)
Please see the MyChart message reply(ies) for my assessment and plan.    This patient gave consent for this Medical Advice Message and is aware that it may result in a bill to [employer], as well as the possibility of receiving a bill for a co-payment or deductible. They are an established patient, but are not seeking medical advice exclusively about a problem treated during an in person or video visit in the last seven days. I did not recommend an in person or video visit within seven days of my reply.    I spent a total of 18 minutes cumulative time within 7 days through [employer].  Rexene Alberts, NP

## 2022-10-05 NOTE — Addendum Note (Signed)
Addended by: Blanchard Kelch on: 10/05/2022 01:33 PM   Modules accepted: Orders

## 2022-10-08 ENCOUNTER — Other Ambulatory Visit (HOSPITAL_BASED_OUTPATIENT_CLINIC_OR_DEPARTMENT_OTHER): Payer: Self-pay

## 2022-10-12 ENCOUNTER — Encounter: Payer: Self-pay | Admitting: Hematology and Oncology

## 2022-10-14 ENCOUNTER — Other Ambulatory Visit: Payer: Self-pay | Admitting: Infectious Diseases

## 2022-10-14 ENCOUNTER — Other Ambulatory Visit (HOSPITAL_BASED_OUTPATIENT_CLINIC_OR_DEPARTMENT_OTHER): Payer: Self-pay

## 2022-10-14 ENCOUNTER — Telehealth: Payer: Self-pay | Admitting: Pharmacy Technician

## 2022-10-14 MED ORDER — LINEZOLID 600 MG PO TABS
600.0000 mg | ORAL_TABLET | Freq: Two times a day (BID) | ORAL | 0 refills | Status: AC
  Filled 2022-10-14 – 2022-11-10 (×2): qty 10, 5d supply, fill #0

## 2022-10-14 NOTE — Telephone Encounter (Signed)
Auth Submission: NO AUTH NEEDED Site of care: Site of care: [facility] Payer: [employer] Medication & CPT/J Code(s) submitted: Dalbavancin  Units/visits requested: 1500mg  x1 Reference number: verified via phone, pre cert not required Approval from: 10/14/22 to 02/13/23    Patient will be scheduled asap.

## 2022-10-14 NOTE — Addendum Note (Signed)
Addended by: Blanchard Kelch on: 10/14/2022 03:20 PM   Modules accepted: Orders

## 2022-10-15 ENCOUNTER — Ambulatory Visit (INDEPENDENT_AMBULATORY_CARE_PROVIDER_SITE_OTHER): Payer: Commercial Managed Care - PPO

## 2022-10-15 ENCOUNTER — Other Ambulatory Visit (INDEPENDENT_AMBULATORY_CARE_PROVIDER_SITE_OTHER): Payer: Commercial Managed Care - PPO

## 2022-10-15 VITALS — BP 108/72 | HR 80 | Temp 99.3°F | Resp 16 | Ht 62.5 in | Wt 151.8 lb

## 2022-10-15 DIAGNOSIS — N61 Mastitis without abscess: Secondary | ICD-10-CM

## 2022-10-15 LAB — COMPREHENSIVE METABOLIC PANEL
ALT: 9 U/L (ref 0–35)
AST: 21 U/L (ref 0–37)
Albumin: 4.2 g/dL (ref 3.5–5.2)
Alkaline Phosphatase: 42 U/L (ref 39–117)
BUN: 13 mg/dL (ref 6–23)
CO2: 30 mEq/L (ref 19–32)
Calcium: 9.1 mg/dL (ref 8.4–10.5)
Chloride: 100 mEq/L (ref 96–112)
Creatinine, Ser: 0.75 mg/dL (ref 0.40–1.20)
GFR: 88.73 mL/min (ref >60.00)
Glucose, Bld: 145 mg/dL — ABNORMAL HIGH (ref 70–99)
Potassium: 4.6 mEq/L (ref 3.5–5.1)
Sodium: 136 mEq/L (ref 135–145)
Total Bilirubin: 0.4 mg/dL (ref 0.2–1.2)
Total Protein: 7.1 g/dL (ref 6.0–8.3)

## 2022-10-15 LAB — CBC WITH DIFFERENTIAL/PLATELET
Basophils Absolute: 0 10*3/uL (ref 0.0–0.1)
Basophils Relative: 0.6 % (ref 0.0–3.0)
Eosinophils Absolute: 0.1 10*3/uL (ref 0.0–0.7)
Eosinophils Relative: 1.4 % (ref 0.0–5.0)
HCT: 38.8 % (ref 36.0–46.0)
Hemoglobin: 13 g/dL (ref 12.0–15.0)
Lymphocytes Relative: 29.6 % (ref 12.0–46.0)
Lymphs Abs: 1.6 10*3/uL (ref 0.7–4.0)
MCHC: 33.6 g/dL (ref 30.0–36.0)
MCV: 90.5 fl (ref 78.0–100.0)
Monocytes Absolute: 0.3 10*3/uL (ref 0.1–1.0)
Monocytes Relative: 6.2 % (ref 3.0–12.0)
Neutro Abs: 3.3 10*3/uL (ref 1.4–7.7)
Neutrophils Relative %: 62.2 % (ref 43.0–77.0)
Platelets: 400 10*3/uL (ref 150.0–400.0)
RBC: 4.29 Mil/uL (ref 3.87–5.11)
RDW: 12.5 % (ref 11.5–15.5)
WBC: 5.4 10*3/uL (ref 4.0–10.5)

## 2022-10-15 MED ORDER — DEXTROSE 5 % IV SOLN
1500.0000 mg | Freq: Once | INTRAVENOUS | Status: AC
  Administered 2022-10-15: 1500 mg via INTRAVENOUS
  Filled 2022-10-15: qty 75

## 2022-10-15 NOTE — Progress Notes (Addendum)
Diagnosis: cellulitis of left breast  Provider:  Chilton Greathouse MD  Procedure: IV Infusion  IV Type: Peripheral, IV Location: L Forearm  Dalvance (Dalbavancin), Dose: 1500mg    Infusion Start Time: 0926  Infusion Stop Time: 1003  Post Infusion IV Care: Observation period completed, peripheral IV discontinued  Discharge: Condition: Good, Destination: Home . AVS Declined  Performed by:  Sandie Ano, RN

## 2022-10-15 NOTE — Telephone Encounter (Signed)
Please see the MyChart message reply(ies) for my assessment and plan.    This patient gave consent for this Medical Advice Message and is aware that it may result in a bill to [employer], as well as the possibility of receiving a bill for a co-payment or deductible. They are an established patient, but are not seeking medical advice exclusively about a problem treated during an in person or video visit in the last seven days. I did not recommend an in person or video visit within seven days of my reply.    I spent a total of 30 additional minutes cumulative time within the last 3 days through [employer], coordinating care and treatment for refractory cellulitis.     Rexene Alberts, NP

## 2022-10-21 ENCOUNTER — Other Ambulatory Visit: Payer: Self-pay

## 2022-10-21 ENCOUNTER — Ambulatory Visit (INDEPENDENT_AMBULATORY_CARE_PROVIDER_SITE_OTHER): Payer: Commercial Managed Care - PPO | Admitting: Infectious Diseases

## 2022-10-21 ENCOUNTER — Encounter: Payer: Self-pay | Admitting: Infectious Diseases

## 2022-10-21 VITALS — BP 104/66 | HR 83 | Temp 98.5°F | Resp 16 | Wt 151.2 lb

## 2022-10-21 DIAGNOSIS — N61 Mastitis without abscess: Secondary | ICD-10-CM | POA: Diagnosis not present

## 2022-10-21 NOTE — Patient Instructions (Addendum)
Plan going forward -  Have the linezolid on hand to start in the event of a cellulitis episode

## 2022-10-21 NOTE — Assessment & Plan Note (Addendum)
Fortunately Stephanie [name] is finally starting to feel better today now 6d after the dose of dalvance 1500 mg following 2 weeks of cefadroxil 1 gm bid. She had no side effects and found the process efficient.  Blood work at the time of her visit at the infusion center was reassuring. She is encouraged that she is finally starting to feel improved. Stinging discomfort and pinkness not back to baseline yet, though she will have another week of coverage with the dose in her system so I am encouraged this will continue to resolve for her.   We spent time reviewing previous treatment history and her initial episode back in 2022.  She had a dermal punch back then with pathology indicating early dermatitis possibly.  I do think given she has had a good response to antibiotics another punch won't provide [place] much additional information and would risk a wound that would delay healing potentially and create a new problem for her.   Going forward, I will have her have linezolid on demand at home to start for on demand treatment with any relapse. We can use this as a bridge to get her in for dalvance infusion again. This would also offer an anti-toxin benefit that cefadroxil does not have and provide typical coverage for staph/strep beautifully. No risk for interaction with effexor and she can continue that at current dose.  She typically does need 2-3 weeks of treatment from past experiences with her and does best with on demand antibiotics given frequency of flares.   I asked her to message me with an update next week to ensure she is still on track with improvement and resolution.

## 2022-10-21 NOTE — Progress Notes (Signed)
Subjective:  Patient ID: Stephanie [name], female    DOB: September 29, 1965  Age: 57 y.o. MRN: [id number]   CC:  Chief Complaint  Patient presents with   Follow-up    Cellulitis of left breast    HPI DAJE STARK presents for follow up left breast cellulitis.    [name] reached out around 6/6 with notification of recurrence of cellultis with a more severe redness than she has had in a while. Feels like she may have gotten a late jump on starting treatment for it. Started cefadroxil 1 gm BID and continued this for 2 weeks. At virtual FU she had improvement in the erythema but still was feeling badly with intermittent chills and largely fatigue.  We decided to give a dose of dalvance 1500 mg last week on 6/20 for concern over refractory cellulitis (fatigue, malaise, ongoing swelling symptoms of left breast). WBC normal. CMP unremarkable.   She feels much much better. She still has some pinkness to the area that she has seen. Areola is more hyperemic vs the right and she is experiencing some ongoing tenderness in the axillary quadrant.  She did have debulking surgery but no lymph node removal in the past of this breast.   She asked if a biopsy / culture would be helpful.    Past Medical History:  Diagnosis Date   Anxiety    Breast mass, left    Cancer (HCC)    Complication of anesthesia    felt lethargic x 4 days following last surgery in 2017, but 2018 did okay   Depression    Headache    migraines   History of radiation therapy 03/13/19- 04/11/19   Left Breast 16 fraction of 2. 66 Gy each to total 42.56 Gy. Left breast boost 4 fractions of 2 Gy to total 8 Gy.    Osteopenia     Past Surgical History:  Procedure Laterality Date   BREAST BIOPSY Left 07/10/2015   high risk stereo   BREAST EXCISIONAL BIOPSY Left 08/15/2015   ADH   BREAST LUMPECTOMY Left 02/09/2019   BREAST LUMPECTOMY WITH RADIOACTIVE SEED LOCALIZATION Left 08/15/2015   Procedure:  LEFT BREAST LUMPECTOMY WITH  RADIOACTIVE SEED LOCALIZATION;  Surgeon: Harriette Bouillon, MD;  Location: [facility];  Service: General;  Laterality: Left;   BREAST LUMPECTOMY WITH RADIOACTIVE SEED LOCALIZATION Left 02/09/2019   Procedure: LEFT BREAST LUMPECTOMY WITH RADIOACTIVE SEED LOCALIZATION;  Surgeon: Emelia Loron, MD;  Location: [facility] OR;  Service: General;  Laterality: Left;   EXCISION OF BREAST BIOPSY Left 12/30/2016   Benign ALH sclerosing lesion   RADIOACTIVE SEED GUIDED EXCISIONAL BREAST BIOPSY Left 12/30/2016   Procedure: RADIOACTIVE SEED GUIDED EXCISIONAL LEFT BREAST BIOPSY;  Surgeon: Emelia Loron, MD;  Location: [facility];  Service: General;  Laterality: Left;   RADIOACTIVE SEED GUIDED EXCISIONAL BREAST BIOPSY Left 02/09/2019   Procedure: RADIOACTIVE SEED GUIDED EXCISIONAL LEFT  BREAST BIOPSY;  Surgeon: Emelia Loron, MD;  Location: [facility] OR;  Service: General;  Laterality: Left;   TONSILLECTOMY  Age 75   TRIGGER FINGER RELEASE  2010   Right Thumb    Social History   Socioeconomic History   Marital status: Married    Spouse name: Not on file   Number of children: Not on file   Years of education: Not on file   Highest education level: Not on file  Occupational History   Occupation: RN    Employer: [employer]  Tobacco Use  Smoking status: Never   Smokeless tobacco: Never  Vaping Use   Vaping Use: Never used  Substance and Sexual Activity   Alcohol use: No    Comment: rare use   Drug use: No   Sexual activity: Yes    Partners: Male    Birth control/protection: Post-menopausal  Other Topics Concern   Not on file  Social History Narrative   04/05/12 AM he was born and grew up in [facility]. She has 3 older brothers. She never suffered any abuse. Her father died when she was 81 years of age. She completed her bachelor of science in nursing in 1989, and has been working as an Charity fundraiser . She has been married for 21 years, and she and her husband  have a 68 year old son and a 73 year old daughter. She affiliates as Control and instrumentation engineer. She denies any legal involvement. She enjoys hiking and reading. She reports her social support consists of friends from college, and a friend from church. 04/05/2012   Social Determinants of Health   Financial Resource Strain: Not on file  Food Insecurity: Not on file  Transportation Needs: No Transportation Needs (03/03/2019)   PRAPARE - Administrator, Civil Service (Medical): No    Lack of Transportation (Non-Medical): No  Physical Activity: Not on file  Stress: Not on file  Social Connections: Not on file  Intimate Partner Violence: Not At Risk (03/03/2019)   Humiliation, Afraid, Rape, and Kick questionnaire    Fear of Current or Ex-Partner: No    Emotionally Abused: No    Physically Abused: No    Sexually Abused: No    Outpatient Medications Prior to Visit  Medication Sig Dispense Refill   Biotin w/ Vitamins C & E (HAIR/SKIN/NAILS PO) Take 1 tablet by mouth daily.      Calcium Carb-Cholecalciferol (CALCIUM + D3 PO) Take 1 tablet by mouth daily.      calcium carbonate (OS-CAL) 1250 (500 Ca) MG chewable tablet Chew 1 tablet by mouth daily.     gabapentin (NEURONTIN) 300 MG capsule TAKE 1 CAPSULE BY MOUTH AT BEDTIME 90 capsule 4   tamoxifen (NOLVADEX) 20 MG tablet Take 1 tablet (20 mg total) by mouth daily. 30 tablet 11   venlafaxine XR (EFFEXOR-XR) 75 MG 24 hr capsule Take 1 capsule (75 mg total) by mouth daily with breakfast. 90 capsule 1   cefadroxil (DURICEF) 500 MG capsule Take 2 capsules (1,000 mg total) by mouth 2 (two) times daily for 14 days. Start at onset of cellulitis symptoms (Patient not taking: Reported on 10/21/2022) 56 capsule 3   estradiol (ESTRING) 2 MG vaginal ring Place 2 mg vaginally every 3 (three) months. follow package directions 1 each 12   triamcinolone cream (KENALOG) 0.1 % Apply to affected area 2 (two) times daily. (Patient not taking: Reported on 10/21/2022) 30 g 0    zolpidem (AMBIEN) 5 MG tablet Take 1 tablet (5 mg total) by mouth at bedtime as needed for sleep. 2 tablet 0   Facility-Administered Medications Prior to Visit  Medication Dose Route Frequency Provider Last Rate Last Admin   0.9 %  sodium chloride infusion   Intravenous Continuous Magrinat, Valentino Hue, MD 20 mL/hr at 02/06/21 1648 Infusion Verify at 02/06/21 1648    Allergies  Allergen Reactions   Wellbutrin Xl [Bupropion] Hives    ROS Review of Systems  Constitutional:  Negative for fever. Chills: shaking. Skin:  Positive for color change. Negative for wound.      Objective:  Physical Exam Vitals reviewed.  Constitutional:      Appearance: Normal appearance. She is not ill-appearing.  Cardiovascular:     Rate and Rhythm: Normal rate.  Pulmonary:     Effort: Pulmonary effort is normal.  Skin:    General: Skin is warm and dry.     Comments: Skin overlying left breast normal in appearance. No discoloration noted. No wounds. No swelling.   Neurological:     Mental Status: She is alert and oriented to person, place, and time.      BP 104/66   Pulse 83   Temp 98.5 F (36.9 C) (Oral)   Resp 16   Wt 151 lb 3.2 oz (68.6 kg)   LMP 01/25/2014 (Approximate)   SpO2 98%   BMI 27.21 kg/m  Wt Readings from Last 3 Encounters:  10/21/22 151 lb 3.2 oz (68.6 kg)  10/15/22 151 lb 12.8 oz (68.9 kg)  06/02/22 160 lb (72.6 kg)     Health Maintenance Due  Topic Date Due   COVID-19 Vaccine (1) Never done   HIV Screening  Never done   Hepatitis C Screening  Never done   DTaP/Tdap/Td (1 - Tdap) Never done   Zoster Vaccines- Shingrix (1 of 2) Never done   Colonoscopy  Never done   PAP SMEAR-Modifier  09/06/2020    There are no preventive care reminders to display for this patient.  Lab Results  Component Value Date   TSH 2.880 10/24/2019   Lab Results  Component Value Date   WBC 5.4 10/15/2022   HGB 13.0 10/15/2022   HCT 38.8 10/15/2022   MCV 90.5 10/15/2022   PLT  400.0 10/15/2022   Lab Results  Component Value Date   NA 136 10/15/2022   K 4.6 10/15/2022   CO2 30 10/15/2022   GLUCOSE 145 (H) 10/15/2022   BUN 13 10/15/2022   CREATININE 0.75 10/15/2022   BILITOT 0.4 10/15/2022   ALKPHOS 42 10/15/2022   AST 21 10/15/2022   ALT 9 10/15/2022   PROT 7.1 10/15/2022   ALBUMIN 4.2 10/15/2022   CALCIUM 9.1 10/15/2022   ANIONGAP 6 05/29/2021   GFR 88.73 10/15/2022   Lab Results  Component Value Date   CHOL 252 (H) 10/24/2019   Lab Results  Component Value Date   HDL 66 10/24/2019   Lab Results  Component Value Date   LDLCALC 144 (H) 10/24/2019   Lab Results  Component Value Date   TRIG 235 (H) 10/24/2019   Lab Results  Component Value Date   CHOLHDL 3.8 10/24/2019   Lab Results  Component Value Date   HGBA1C 5.3 10/24/2019      Assessment & Plan:   Problem List Items Addressed This Visit       Unprioritized   Cellulitis of left breast - Primary    Fortunately [name] is finally starting to feel better today now 6d after the dose of dalvance 1500 mg following 2 weeks of cefadroxil 1 gm bid. She had no side effects and found the process efficient.  Blood work at the time of her visit at the infusion center was reassuring. She is encouraged that she is finally starting to feel improved. Stinging discomfort and pinkness not back to baseline yet, though she will have another week of coverage with the dose in her system so I am encouraged this will continue to resolve for her.   We spent time reviewing previous treatment history and her initial episode back in 2022.  She  had a dermal punch back then with pathology indicating early dermatitis possibly.  I do think given she has had a good response to antibiotics another punch won't provide [place] much additional information and would risk a wound that would delay healing potentially and create a new problem for her.   Going forward, I will have her have linezolid on demand at home to start  for on demand treatment with any relapse. We can use this as a bridge to get her in for dalvance infusion again. This would also offer an anti-toxin benefit that cefadroxil does not have and provide typical coverage for staph/strep beautifully. No risk for interaction with effexor and she can continue that at current dose.  She typically does need 2-3 weeks of treatment from past experiences with her and does best with on demand antibiotics given frequency of flares.   I asked her to message me with an update next week to ensure she is still on track with improvement and resolution.       No orders of the defined types were placed in this encounter.   Rexene Alberts, MSN, NP-C [facility] for Infectious Disease [facility]  [email or web address] Pager: [phone] Office: [phone] [facility] Main Line: [phone] *Secure Chat Communication Welcome

## 2022-10-22 DIAGNOSIS — H40053 Ocular hypertension, bilateral: Secondary | ICD-10-CM | POA: Diagnosis not present

## 2022-10-24 ENCOUNTER — Other Ambulatory Visit (HOSPITAL_BASED_OUTPATIENT_CLINIC_OR_DEPARTMENT_OTHER): Payer: Self-pay

## 2022-10-31 ENCOUNTER — Other Ambulatory Visit (HOSPITAL_BASED_OUTPATIENT_CLINIC_OR_DEPARTMENT_OTHER): Payer: Self-pay

## 2022-11-04 NOTE — Progress Notes (Signed)
Stephanie [name] [address] [place] [address] Phone: [phone] Subjective:   Stephanie [name], am serving as a scribe for Dr. Antoine Primas.  I'm seeing this patient by the request  of:  Eartha Inch, MD  CC: left achilles tendon pain   NGE:[id number]  11/11/2021 OMT  Updated 11/05/2022 Stephanie [name] is a 57 y.o. female coming in with complaint of achilles pain that was aggravated when walking all day in sandals without heel lifts. Today patient reports that the pain is better but the still is having issues with it. Patient did get the new lifts and that did help. States this is more of an aching pain than the knot she has had before.       Past Medical History:  Diagnosis Date   Anxiety    Breast mass, left    Cancer (HCC)    Complication of anesthesia    felt lethargic x 4 days following last surgery in 2017, but 2018 did okay   Depression    Headache    migraines   History of radiation therapy 03/13/19- 04/11/19   Left Breast 16 fraction of 2. 66 Gy each to total 42.56 Gy. Left breast boost 4 fractions of 2 Gy to total 8 Gy.    Osteopenia    Past Surgical History:  Procedure Laterality Date   BREAST BIOPSY Left 07/10/2015   high risk stereo   BREAST EXCISIONAL BIOPSY Left 08/15/2015   ADH   BREAST LUMPECTOMY Left 02/09/2019   BREAST LUMPECTOMY WITH RADIOACTIVE SEED LOCALIZATION Left 08/15/2015   Procedure:  LEFT BREAST LUMPECTOMY WITH RADIOACTIVE SEED LOCALIZATION;  Surgeon: Harriette Bouillon, MD;  Location: [facility];  Service: General;  Laterality: Left;   BREAST LUMPECTOMY WITH RADIOACTIVE SEED LOCALIZATION Left 02/09/2019   Procedure: LEFT BREAST LUMPECTOMY WITH RADIOACTIVE SEED LOCALIZATION;  Surgeon: Emelia Loron, MD;  Location[facility];  Service: General;  Laterality: Left;   EXCISION OF BREAST BIOPSY Left 12/30/2016   Benign ALH sclerosing lesion   RADIOACTIVE SEED GUIDED EXCISIONAL BREAST BIOPSY  Left 12/30/2016   Procedure: RADIOACTIVE SEED GUIDED EXCISIONAL LEFT BREAST BIOPSY;  Surgeon: Emelia Loron, MD;  Location: [facility];  Service: General;  Laterality: Left;   RADIOACTIVE SEED GUIDED EXCISIONAL BREAST BIOPSY Left 02/09/2019   Procedure: RADIOACTIVE SEED GUIDED EXCISIONAL LEFT  BREAST BIOPSY;  Surgeon: Emelia Loron, MD;  [facility];  Service: General;  Laterality: Left;   TONSILLECTOMY  Age 80   TRIGGER FINGER RELEASE  2010   Right Thumb   Social History   Socioeconomic History   Marital status: Married    Spouse name: Not on file   Number of children: Not on file   Years of education: Not on file   Highest education level: Not on file  Occupational History   Occupation: RN    Employer: [employer]  Tobacco Use   Smoking status: Never   Smokeless tobacco: Never  Vaping Use   Vaping status: Never Used  Substance and Sexual Activity   Alcohol use: No    Comment: rare use   Drug use: No   Sexual activity: Yes    Partners: Male    Birth control/protection: Post-menopausal  Other Topics Concern   Not on file  Social History Narrative   04/05/12 AM he was born and grew up in [place]. She has 3 older brothers. She never suffered any abuse. Her father died when  she was 48 years of age. She completed her bachelor of science in nursing in 1989, and has been working as an Charity fundraiser . She has been married for 21 years, and she and her husband have a 58 year old son and a 13 year old daughter. She affiliates as Control and instrumentation engineer. She denies any legal involvement. She enjoys hiking and reading. She reports her social support consists of friends from college, and a friend from church. 04/05/2012   Social Determinants of Health   Financial Resource Strain: Not on file  Food Insecurity: Not on file  Transportation Needs: No Transportation Needs (03/03/2019)   PRAPARE - Administrator, Civil Service (Medical): No    Lack of  Transportation (Non-Medical): No  Physical Activity: Not on file  Stress: Not on file  Social Connections: Not on file   Allergies  Allergen Reactions   Wellbutrin Xl [Bupropion] Hives   Family History  Problem Relation Age of Onset   Hypertension Mother    Osteoporosis Mother    Depression Brother    Atrial fibrillation Brother    Coronary artery disease Brother    Coronary artery disease Brother    Atrial fibrillation Brother    Parkinson's disease Maternal Aunt    Diabetes Maternal Grandmother    Diabetes Maternal Grandfather    Diabetes Paternal Grandmother    Diabetes Paternal Grandfather    ADD / ADHD Daughter    ADD / ADHD Son               Current Outpatient Medications (Other):    Biotin w/ Vitamins C & E (HAIR/SKIN/NAILS PO), Take 1 tablet by mouth daily.    Calcium Carb-Cholecalciferol (CALCIUM + D3 PO), Take 1 tablet by mouth daily.    calcium carbonate (OS-CAL) 1250 (500 Ca) MG chewable tablet, Chew 1 tablet by mouth daily.   gabapentin (NEURONTIN) 300 MG capsule, TAKE 1 CAPSULE BY MOUTH AT BEDTIME   sulfamethoxazole-trimethoprim (BACTRIM DS) 800-160 MG tablet, Take 1 tablet by mouth 2 (two) times daily for 3 days.   tamoxifen (NOLVADEX) 20 MG tablet, Take 1 tablet (20 mg total) by mouth daily.   triamcinolone cream (KENALOG) 0.1 %, Apply to affected area 2 (two) times daily.   venlafaxine XR (EFFEXOR-XR) 75 MG 24 hr capsule, Take 1 capsule (75 mg total) by mouth daily with breakfast.   Facility-Administered Medications Ordered in Other Visits (Other):    0.9 %  sodium chloride infusion No current facility-administered medications for this visit.   Reviewed prior external information including notes and imaging from  primary care provider As well as notes that were available from care everywhere and other healthcare systems.  Past medical history, social, surgical and family history all reviewed in electronic medical record.  No pertanent  information unless stated regarding to the chief complaint.   Review of Systems:  No headache, visual changes, nausea, vomiting, diarrhea, constipation, dizziness, abdominal pain, skin rash, fevers, chills, night sweats, weight loss, swollen lymph nodes, body aches, joint swelling, chest pain, shortness of breath, mood changes. POSITIVE muscle aches  Objective  Blood pressure 110/70, pulse 75, height 5' 2.5" (1.588 m), weight 150 lb (68 kg), last menstrual period 01/25/2014, SpO2 98%.   General: No apparent distress alert and oriented x3 mood and affect normal, dressed appropriately.  HEENT: Pupils equal, extraocular movements intact  Respiratory: Patient's speak in full sentences and does not appear short of breath  Cardiovascular: No lower extremity edema, non tender, no erythema  Ankle shows mild nodularity  of the Achilles but significant improvement from previous exam. Patient has good range of motion of the ankle.  5 out of 5 strength. Low back exam does have some loss of lordosis noted.  Some tenderness to palpation in the paraspinal musculature.  Limited muscular skeletal ultrasound was performed and interpreted by Antoine Primas, M  Limited ultrasound shows no significant hypoechoic changes.  No increase in Doppler flow of the Achilles.  Mild hypoechoic changes within the posterior tibialis tendon sheath.  Osteopathic findings C4 flexed rotated and side bent right C6 flexed rotated and side bent left T6 extended rotated and side bent right inhaled third rib L2 flexed rotated and side bent right L4 flexed rotated and side bent left Sacrum right on right     Impression and Recommendations:    The above documentation has been reviewed and is accurate and complete Judi Saa, DO

## 2022-11-05 ENCOUNTER — Ambulatory Visit: Payer: Commercial Managed Care - PPO | Admitting: Family Medicine

## 2022-11-05 ENCOUNTER — Other Ambulatory Visit: Payer: Self-pay

## 2022-11-05 ENCOUNTER — Telehealth: Payer: Self-pay | Admitting: Obstetrics & Gynecology

## 2022-11-05 ENCOUNTER — Other Ambulatory Visit (HOSPITAL_BASED_OUTPATIENT_CLINIC_OR_DEPARTMENT_OTHER): Payer: Self-pay

## 2022-11-05 VITALS — BP 110/70 | HR 75 | Ht 62.5 in | Wt 150.0 lb

## 2022-11-05 DIAGNOSIS — M25572 Pain in left ankle and joints of left foot: Secondary | ICD-10-CM | POA: Diagnosis not present

## 2022-11-05 DIAGNOSIS — R3989 Other symptoms and signs involving the genitourinary system: Secondary | ICD-10-CM

## 2022-11-05 DIAGNOSIS — G8929 Other chronic pain: Secondary | ICD-10-CM

## 2022-11-05 DIAGNOSIS — M9902 Segmental and somatic dysfunction of thoracic region: Secondary | ICD-10-CM

## 2022-11-05 DIAGNOSIS — M9904 Segmental and somatic dysfunction of sacral region: Secondary | ICD-10-CM

## 2022-11-05 DIAGNOSIS — M533 Sacrococcygeal disorders, not elsewhere classified: Secondary | ICD-10-CM | POA: Diagnosis not present

## 2022-11-05 DIAGNOSIS — M9908 Segmental and somatic dysfunction of rib cage: Secondary | ICD-10-CM | POA: Diagnosis not present

## 2022-11-05 DIAGNOSIS — M6788 Other specified disorders of synovium and tendon, other site: Secondary | ICD-10-CM | POA: Diagnosis not present

## 2022-11-05 DIAGNOSIS — M9901 Segmental and somatic dysfunction of cervical region: Secondary | ICD-10-CM | POA: Diagnosis not present

## 2022-11-05 DIAGNOSIS — M9903 Segmental and somatic dysfunction of lumbar region: Secondary | ICD-10-CM

## 2022-11-05 MED ORDER — SULFAMETHOXAZOLE-TRIMETHOPRIM 800-160 MG PO TABS
1.0000 | ORAL_TABLET | Freq: Two times a day (BID) | ORAL | 0 refills | Status: AC
Start: 2022-11-05 — End: 2022-11-08
  Filled 2022-11-05: qty 6, 3d supply, fill #0

## 2022-11-05 NOTE — Telephone Encounter (Signed)
After hour call- pt reporting significant urinary frequency and burning with urination.  Pt has had a UTI in the past and this feels the same.   No fever or chills.  No other acute complaints.  Rx sent in for treatment -if no improvement, advised follow up appt  Myna Hidalgo, DO Attending Obstetrician & Gynecologist, [facility] for [facility], [facility]

## 2022-11-05 NOTE — Patient Instructions (Signed)
Good to see you! Try to keep heel on some type of raise when using free weight Stretch and ice after See you again in 3 months

## 2022-11-06 ENCOUNTER — Encounter: Payer: Self-pay | Admitting: Family Medicine

## 2022-11-06 ENCOUNTER — Other Ambulatory Visit (HOSPITAL_BASED_OUTPATIENT_CLINIC_OR_DEPARTMENT_OTHER): Payer: Self-pay

## 2022-11-06 ENCOUNTER — Other Ambulatory Visit: Payer: Self-pay | Admitting: Adult Health

## 2022-11-06 ENCOUNTER — Other Ambulatory Visit: Payer: Self-pay

## 2022-11-06 MED ORDER — VENLAFAXINE HCL ER 75 MG PO CP24
75.0000 mg | ORAL_CAPSULE | Freq: Every day | ORAL | 1 refills | Status: DC
  Filled 2022-11-06: qty 90, 90d supply, fill #0
  Filled 2023-02-01: qty 90, 90d supply, fill #1

## 2022-11-06 MED ORDER — GABAPENTIN 300 MG PO CAPS
300.0000 mg | ORAL_CAPSULE | Freq: Every day | ORAL | 4 refills | Status: DC
  Filled 2022-11-06: qty 90, 90d supply, fill #0
  Filled 2023-01-04 – 2023-02-01 (×2): qty 90, 90d supply, fill #1
  Filled 2023-05-04: qty 90, 90d supply, fill #2
  Filled 2023-08-12: qty 90, 90d supply, fill #3

## 2022-11-06 NOTE — Assessment & Plan Note (Signed)
Chronic problem with exacerbation.  Unlikely was comfortable and sitting for more of the ankle.  The ankle seems to be making improvement.  Discussed icing regimen and home exercises, discussed which activities to do and which ones to avoid.  Increase activity slowly.  Follow-up again in 6 to 8 weeks otherwise.

## 2022-11-06 NOTE — Assessment & Plan Note (Signed)
Improvement noted, mild posterior tibialis tendinitis noted as well.  Could consider the possibility of over-the-counter orthotics.  Hopefully will do well with conservative therapy and follow-up with me again in 2 to 3 months if necessary

## 2022-11-10 ENCOUNTER — Other Ambulatory Visit (HOSPITAL_BASED_OUTPATIENT_CLINIC_OR_DEPARTMENT_OTHER): Payer: Self-pay

## 2022-12-29 ENCOUNTER — Encounter (HOSPITAL_BASED_OUTPATIENT_CLINIC_OR_DEPARTMENT_OTHER): Payer: Self-pay | Admitting: Cardiovascular Disease

## 2022-12-29 ENCOUNTER — Ambulatory Visit (HOSPITAL_BASED_OUTPATIENT_CLINIC_OR_DEPARTMENT_OTHER): Payer: Commercial Managed Care - PPO | Admitting: Cardiovascular Disease

## 2022-12-29 VITALS — BP 126/78 | HR 77 | Ht 62.5 in | Wt 148.0 lb

## 2022-12-29 DIAGNOSIS — Z8249 Family history of ischemic heart disease and other diseases of the circulatory system: Secondary | ICD-10-CM

## 2022-12-29 DIAGNOSIS — E7800 Pure hypercholesterolemia, unspecified: Secondary | ICD-10-CM

## 2022-12-29 DIAGNOSIS — Z13220 Encounter for screening for lipoid disorders: Secondary | ICD-10-CM | POA: Diagnosis not present

## 2022-12-29 HISTORY — DX: Family history of ischemic heart disease and other diseases of the circulatory system: Z82.49

## 2022-12-29 HISTORY — DX: Pure hypercholesterolemia, unspecified: E78.00

## 2022-12-29 NOTE — Patient Instructions (Signed)
Medication Instructions:  Your physician recommends that you continue on your current medications as directed. Please refer to the Current Medication list given to you today.   *If you need a refill on your cardiac medications before your next appointment, please call your pharmacy*  Lab Work: FASTING LP/CMET/LPa SOON   If you have labs (blood work) drawn today and your tests are completely normal, you will receive your results only by: MyChart Message (if you have MyChart) OR A paper copy in the mail If you have any lab test that is abnormal or we need to change your treatment, we will call you to review the results.  Testing/Procedures: CALCIUM SCORE-THIS WILL COST YOU $99 OUT OF POCKET   Follow-Up: At [facility], you and your health needs are our priority.  As part of our continuing mission to provide you with exceptional heart care, we have created designated Provider Care Teams.  These Care Teams include your primary Cardiologist (physician) and Advanced Practice Providers (APPs -  Physician Assistants and Nurse Practitioners) who all work together to provide you with the care you need, when you need it.  We recommend signing up for the patient portal called "MyChart".  Sign up information is provided on this After Visit Summary.  MyChart is used to connect with patients for Virtual Visits (Telemedicine).  Patients are able to view lab/test results, encounter notes, upcoming appointments, etc.  Non-urgent messages can be sent to your provider as well.   To learn more about what you can do with MyChart, go to [email or web address].    Your next appointment:   12 month(s)  Provider:   Chilton Si, MD or Gillian Shields, NP     1 MONTH WITH Eula Fried NP

## 2022-12-29 NOTE — Progress Notes (Deleted)
  Cardiology Office Note:  .   Date:  12/29/2022  ID:  [name], DOB May 05, 1965, MRN [id number] PCP: Eartha Inch, MD  [facility] Providers Cardiologist:  None { Click to update primary MD,subspecialty MD or APP then REFRESH:1}   History of Present Illness: .   Stephanie [name] is a 57 y.o. female with prior breast cancer and anxiety who is being seen today for the evaluation of family history of CAD at the request of Reva Bores, MD.   ROS: ***  Studies Reviewed: .        *** Risk Assessment/Calculations:   {Does this patient have ATRIAL FIBRILLATION?:[phone]} No BP recorded.  {Refresh Note OR Click here to enter BP  :1}***       Physical Exam:   VS:  LMP 01/25/2014 (Approximate)    Wt Readings from Last 3 Encounters:  11/05/22 150 lb (68 kg)  10/21/22 151 lb 3.2 oz (68.6 kg)  10/15/22 151 lb 12.8 oz (68.9 kg)    GEN: Well nourished, well developed in no acute distress NECK: No JVD; No carotid bruits CARDIAC: ***RRR, no murmurs, rubs, gallops RESPIRATORY:  Clear to auscultation without rales, wheezing or rhonchi  ABDOMEN: Soft, non-tender, non-distended EXTREMITIES:  No edema; No deformity   ASSESSMENT AND PLAN: .   ***    {Are you ordering a CV Procedure (e.g. stress test, cath, DCCV, TEE, etc)?   Press F2        :[id number]}  Dispo: ***  Signed, Chilton Si, MD

## 2022-12-29 NOTE — Progress Notes (Signed)
Cardiology Office Note:  .    Date:  12/29/2022  ID:  Stephanie [name], DOB Jun 28, 1965, MRN [id number] PCP: Eartha Inch, MD  [facility] Providers Cardiologist:  None     History of Present Illness: .    Stephanie [name] is a 57 y.o. female with prior breast cancer s/p radiation therapy, and anxiety, who is being seen today for the evaluation of family history of CAD at the request of Reva Bores, MD. She had reported new family history indicating strong risk of heart disease and requested a referral to cardiology.  Today, she confirms a family history of heart disease. She has one brother who was 23-56 yo when he developed chest tightness, subsequently underwent heart cath revealing an 80% blockage and had 2 stents placed. Afterwards he also had aphasia and a carotid bruit. He was found to have carotid stenosis and underwent endarterectomy. Her oldest brother was 34 yo when he had an echo and was found to have a large blockage, in 02/2022 he underwent CABG x4. Both of her brothers are otherwise very active and in good shape. She has a third brother who is currently in good health with no known heart disease. Her Father died at 57 yo, assumed due to VSD. She doesn't monitor her home blood pressures, typically they have been stable. Did have gestational hypertension during her first pregnancy, not diagnosed with preeclampsia. She also confirms that her cholesterol has been elevated. Her last lipid panel was in 09/2019 with triglycerides 235, HDL 66, and LDL 144. She states that her diet is the best it has ever been. In the past 3 months she has been following a nutrition program with [employer]. In the mornings, meals consist of half protein and half high density carbs. Lunch is vegetables, salads, 25% protein, and 25% high density carbs. Dinner is 25% protein and 75% vegetables. Not as much formal exercise in the past few weeks. Previously active with yoga, walking, and running. No anginal  symptoms. No smoking history. She denies any palpitations, chest pain, shortness of breath, peripheral edema, lightheadedness, headaches, syncope, orthopnea, or PND.  ROS:  Please see the history of present illness. All other systems are reviewed and negative.   Studies Reviewed: Marland Kitchen   EKG Interpretation Date/Time:  Tuesday December 29 2022 13:44:07 EDT Ventricular Rate:  77 PR Interval:  134 QRS Duration:  74 QT Interval:  390 QTC Calculation: 441 R Axis:   35  Text Interpretation: Normal sinus rhythm Normal ECG No previous ECGs available Confirmed by Chilton Si ([id number]) on 12/29/2022 1:51:36 PM    LE Venous Doppler  04/14/2021: Summary:  RIGHT:  - There is no evidence of deep vein thrombosis in the lower extremity.  - There is no evidence of superficial venous thrombosis.    - No cystic structure found in the popliteal fossa.    LEFT:  - No evidence of common femoral vein obstruction.   Risk Assessment/Calculations:             Physical Exam:    VS:  BP 126/78   Pulse 77   Ht 5' 2.5" (1.588 m)   Wt 148 lb (67.1 kg)   LMP 01/25/2014 (Approximate)   BMI 26.64 kg/m  , BMI Body mass index is 26.64 kg/m. GENERAL:  Well appearing HEENT: Pupils equal round and reactive, fundi not visualized, oral mucosa unremarkable NECK:  No jugular venous distention, waveform within normal limits, carotid upstroke brisk and symmetric, no bruits,  no thyromegaly LYMPHATICS:  No cervical adenopathy LUNGS:  Clear to auscultation bilaterally HEART:  RRR.  PMI not displaced or sustained,S1 and S2 within normal limits, no S3, no S4, no clicks, no rubs, no murmurs ABD:  Flat, positive bowel sounds normal in frequency in pitch, no bruits, no rebound, no guarding, no midline pulsatile mass, no hepatomegaly, no splenomegaly EXT:  2 plus pulses throughout, no edema, no cyanosis no clubbing SKIN:  No rashes no nodules NEURO:  Cranial nerves II through XII grossly intact, motor grossly intact  throughout PSYCH:  Cognitively intact, oriented to person place and time  Wt Readings from Last 3 Encounters:  12/29/22 148 lb (67.1 kg)  11/05/22 150 lb (68 kg)  10/21/22 151 lb 3.2 oz (68.6 kg)     ASSESSMENT AND PLAN: .    # Family History of Cardiovascular Disease Significant family history of heart disease with two brothers having significant coronary artery disease requiring intervention. No personal history of cardiovascular symptoms. -Order Coronary Calcium Score to assess for presence of coronary artery disease. -Order fasting lipid panel, comprehensive metabolic panel, and LP(a) to assess cardiovascular risk.  # Breast Cancer History and Radiation Therapy History of breast cancer with radiation therapy to the left side. Concerns about potential cardiac effects of radiation therapy. -Discussed potential increased risk of heart disease due to radiation therapy.  # Hyperlipidemia History of elevated cholesterol levels. -Plan to reassess cholesterol levels and adjust treatment goals based on results of Coronary Calcium Score and LP(a).  Follow-up Plan to follow-up with Nurse Practitioner Eligha Bridegroom within a month to review results and adjust treatment plan as necessary.             Dispo:  FU with APP in 1 month with prevention clinic. FU with Taci Sterling C. Duke Salvia, MD, [facility] in 1 year.   I,Mathew Stumpf,acting as a Neurosurgeon for Chilton Si, MD.,have documented all relevant documentation on the behalf of Chilton Si, MD,as directed by  Chilton Si, MD while in the presence of Chilton Si, MD.  I, Aly Hauser C. Duke Salvia, MD have reviewed all documentation for this visit.  The documentation of the exam, diagnosis, procedures, and orders on 12/29/2022 are all accurate and complete.   Signed, Chilton Si, MD

## 2022-12-31 ENCOUNTER — Other Ambulatory Visit: Payer: Self-pay | Admitting: Hematology and Oncology

## 2022-12-31 DIAGNOSIS — Z1231 Encounter for screening mammogram for malignant neoplasm of breast: Secondary | ICD-10-CM

## 2023-01-04 ENCOUNTER — Other Ambulatory Visit (HOSPITAL_BASED_OUTPATIENT_CLINIC_OR_DEPARTMENT_OTHER): Payer: Self-pay

## 2023-01-04 DIAGNOSIS — Z8249 Family history of ischemic heart disease and other diseases of the circulatory system: Secondary | ICD-10-CM | POA: Diagnosis not present

## 2023-01-04 DIAGNOSIS — Z13220 Encounter for screening for lipoid disorders: Secondary | ICD-10-CM | POA: Diagnosis not present

## 2023-01-05 LAB — LIPID PANEL
Chol/HDL Ratio: 3.8 ratio (ref 0.0–4.4)
Cholesterol, Total: 244 mg/dL — ABNORMAL HIGH (ref 100–199)
HDL: 64 mg/dL
LDL Chol Calc (NIH): 153 mg/dL — ABNORMAL HIGH (ref 0–99)
Triglycerides: 149 mg/dL (ref 0–149)
VLDL Cholesterol Cal: 27 mg/dL (ref 5–40)

## 2023-01-05 LAB — COMPREHENSIVE METABOLIC PANEL WITH GFR
ALT: 16 IU/L (ref 0–32)
AST: 30 IU/L (ref 0–40)
Albumin: 4.2 g/dL (ref 3.8–4.9)
Alkaline Phosphatase: 64 IU/L (ref 44–121)
BUN/Creatinine Ratio: 19 (ref 9–23)
BUN: 13 mg/dL (ref 6–24)
Bilirubin Total: 0.2 mg/dL (ref 0.0–1.2)
CO2: 24 mmol/L (ref 20–29)
Calcium: 9.3 mg/dL (ref 8.7–10.2)
Chloride: 102 mmol/L (ref 96–106)
Creatinine, Ser: 0.7 mg/dL (ref 0.57–1.00)
Globulin, Total: 2.3 g/dL (ref 1.5–4.5)
Glucose: 88 mg/dL (ref 70–99)
Potassium: 4.7 mmol/L (ref 3.5–5.2)
Sodium: 143 mmol/L (ref 134–144)
Total Protein: 6.5 g/dL (ref 6.0–8.5)
eGFR: 101 mL/min/1.73

## 2023-01-05 LAB — LIPOPROTEIN A (LPA): Lipoprotein (a): 230.8 nmol/L — ABNORMAL HIGH (ref <75.0)

## 2023-01-06 ENCOUNTER — Encounter (HOSPITAL_BASED_OUTPATIENT_CLINIC_OR_DEPARTMENT_OTHER): Payer: Self-pay | Admitting: Cardiovascular Disease

## 2023-01-21 ENCOUNTER — Ambulatory Visit (HOSPITAL_BASED_OUTPATIENT_CLINIC_OR_DEPARTMENT_OTHER)
Admission: RE | Admit: 2023-01-21 | Discharge: 2023-01-21 | Disposition: A | Discharge status: Home / Self Care | Payer: Commercial Managed Care - PPO | Source: Ambulatory Visit | Attending: Cardiovascular Disease | Admitting: Cardiovascular Disease

## 2023-01-21 DIAGNOSIS — Z8249 Family history of ischemic heart disease and other diseases of the circulatory system: Secondary | ICD-10-CM | POA: Insufficient documentation

## 2023-01-25 ENCOUNTER — Ambulatory Visit
Admission: RE | Admit: 2023-01-25 | Discharge: 2023-01-25 | Disposition: A | Discharge status: Home / Self Care | Payer: Commercial Managed Care - PPO | Source: Ambulatory Visit | Attending: Hematology and Oncology

## 2023-01-25 DIAGNOSIS — Z1231 Encounter for screening mammogram for malignant neoplasm of breast: Secondary | ICD-10-CM

## 2023-01-27 ENCOUNTER — Encounter: Payer: Self-pay | Admitting: Hematology and Oncology

## 2023-01-29 ENCOUNTER — Other Ambulatory Visit (HOSPITAL_BASED_OUTPATIENT_CLINIC_OR_DEPARTMENT_OTHER): Payer: Self-pay

## 2023-01-29 ENCOUNTER — Telehealth (HOSPITAL_BASED_OUTPATIENT_CLINIC_OR_DEPARTMENT_OTHER): Payer: Self-pay

## 2023-01-29 DIAGNOSIS — E7800 Pure hypercholesterolemia, unspecified: Secondary | ICD-10-CM

## 2023-01-29 MED ORDER — ROSUVASTATIN CALCIUM 10 MG PO TABS
10.0000 mg | ORAL_TABLET | Freq: Every day | ORAL | 3 refills | Status: DC
  Filled 2023-01-29: qty 90, 90d supply, fill #0
  Filled 2023-05-04: qty 90, 90d supply, fill #1
  Filled 2023-08-12: qty 90, 90d supply, fill #2
  Filled 2023-12-22: qty 90, 90d supply, fill #3

## 2023-01-29 NOTE — Telephone Encounter (Addendum)
Seen by patient Stephanie [name] on 01/28/2023 10:03 PM; follow up mychart message sent to patient    ----- Message from Alver Sorrow sent at 01/28/2023  8:30 PM EDT ----- Normal kidneys, liver, electrolytes. Elevated cholesterol. Elevated Lp(a) indicating familial hyperlipidemia and increased risk for cardiovascular events.  Given elevated Lp(a) would recommend starting Rosuvastatin 10mg  daily with repeat FLP/LFT in 2 months.   Recommend aiming for 150 minutes of moderate intensity activity per week and following a heart healthy diet.    CCing [place] as FYI only as has appt upcoming (her Ca score was 0)

## 2023-01-29 NOTE — Addendum Note (Signed)
Addended by: Marlene Lard on: 01/29/2023 10:18 AM   Modules accepted: Orders

## 2023-02-02 ENCOUNTER — Other Ambulatory Visit (HOSPITAL_BASED_OUTPATIENT_CLINIC_OR_DEPARTMENT_OTHER): Payer: Self-pay

## 2023-02-02 ENCOUNTER — Encounter (INDEPENDENT_AMBULATORY_CARE_PROVIDER_SITE_OTHER): Payer: Commercial Managed Care - PPO

## 2023-02-02 DIAGNOSIS — N61 Mastitis without abscess: Secondary | ICD-10-CM

## 2023-02-02 MED ORDER — CEFADROXIL 500 MG PO CAPS
1000.0000 mg | ORAL_CAPSULE | Freq: Two times a day (BID) | ORAL | 2 refills | Status: DC
  Filled 2023-02-02: qty 20, 5d supply, fill #0
  Filled 2023-02-22: qty 20, 5d supply, fill #1
  Filled 2023-05-05: qty 20, 5d supply, fill #2

## 2023-02-02 NOTE — Telephone Encounter (Signed)
Please see the MyChart message reply(ies) for my assessment and plan.    This patient gave consent for this Medical Advice Message and is aware that it may result in a bill to [employer], as well as the possibility of receiving a bill for a co-payment or deductible. They are an established patient, but are not seeking medical advice exclusively about a problem treated during an in person or video visit in the last seven days. I did not recommend an in person or video visit within seven days of my reply.    I spent a total of 6 minutes cumulative time within 7 days through [employer].  Rexene Alberts, NP

## 2023-02-03 ENCOUNTER — Ambulatory Visit (HOSPITAL_BASED_OUTPATIENT_CLINIC_OR_DEPARTMENT_OTHER): Payer: Commercial Managed Care - PPO | Admitting: Nurse Practitioner

## 2023-02-03 ENCOUNTER — Other Ambulatory Visit (HOSPITAL_BASED_OUTPATIENT_CLINIC_OR_DEPARTMENT_OTHER): Payer: Self-pay

## 2023-02-03 ENCOUNTER — Encounter (HOSPITAL_BASED_OUTPATIENT_CLINIC_OR_DEPARTMENT_OTHER): Payer: Self-pay | Admitting: Nurse Practitioner

## 2023-02-03 VITALS — BP 114/64 | HR 74 | Ht 63.0 in | Wt 147.8 lb

## 2023-02-03 DIAGNOSIS — E785 Hyperlipidemia, unspecified: Secondary | ICD-10-CM

## 2023-02-03 DIAGNOSIS — E7841 Elevated Lipoprotein(a): Secondary | ICD-10-CM

## 2023-02-03 DIAGNOSIS — Z8249 Family history of ischemic heart disease and other diseases of the circulatory system: Secondary | ICD-10-CM | POA: Diagnosis not present

## 2023-02-03 DIAGNOSIS — Z789 Other specified health status: Secondary | ICD-10-CM | POA: Diagnosis not present

## 2023-02-03 MED ORDER — REPATHA SURECLICK 140 MG/ML ~~LOC~~ SOAJ
140.0000 mg | SUBCUTANEOUS | 2 refills | Status: DC
  Filled 2023-02-03: qty 2, 28d supply, fill #0
  Filled 2023-04-12: qty 2, 28d supply, fill #1
  Filled 2023-05-02 – 2023-05-04 (×2): qty 2, 28d supply, fill #2

## 2023-02-03 NOTE — Progress Notes (Signed)
Cardiology Office Note:  .   Date:  02/03/2023  ID:  Stephanie [name], DOB 1965-05-16, MRN [id number] PCP: Eartha Inch, MD  [facility] Providers Cardiologist:  None    Patient Profile: .      PMH Family history CAD Breast cancer S/p XRT Dyslipidemia Coronary calcium score of 0 Elevated lipoprotein a Anxiety  Referred to cardiology and seen by Dr. Duke Salvia 12/29/2022 for family history of CAD.  She has 1 brother who was 9-56 y/o when he developed chest tightness and subsequently underwent heart cath revealing 80% blockage with placement of 2 stents.  Afterwards he had aphasia and was found to have carotid bruit.  He underwent endarterectomy for carotid stenosis.  Oldest brother was 42 when he underwent CABG x 4 in 02/2022.  Brother is otherwise healthy and in good shape.  She has a third brother who has no known history of heart disease.  Her father died at 53 years old assumed due to VSD.  She had gestational hypertension during first pregnancy but not diagnosed with preeclampsia.  History of elevated cholesterol.  Lipid panel in 09/2019 with triglycerides 235, HDL 66, and LDL 144.  She reports healthy diet and working with a nutrition program with [employer]. Lunch is vegetables, salads, 25% protein and 25% high density carbs.  Dinner is 25% protein and 75% vegetables.  Not as much formal exercise in the previous few weeks.  Previously active with yoga, walking, and running with no anginal symptoms. No smoking history. She underwent coronary calcium score for further risk stratification.  CT calcium score of 0 on 01/21/2023.  Elevated LP(a) of 230.8.  She was advised to start rosuvastatin 10 mg daily.        History of Present Illness: .   Stephanie [name] is a very pleasant 57 y.o. female who is here today for follow-up. Reports she has been well until this morning when she started having symptoms of vertigo. She has experienced vertigo in the past, but it has been several years since  the last episode. Picked up a prescription for rosuvastatin but has not started it yet due to the onset of vertigo. She is aware that rosuvastatin is best taken with the heaviest meal of the day. We discussed CT calcium score came back at zero, which is excellent. We discussed low but not absent risk of future CV event. Discussed that rosuvastatin is not effective against elevated lipoprotein A, which she also has. We discussed PCSK9i therapy. Strong family history of heart disease, with her father dying at 13 and two brothers recently diagnosed with heart disease. One brother had stents placed after experiencing minor discomfort while running, and the other had a quadruple bypass after a significant blockage was found at a branch of three arteries. She is the youngest sibling and is currently trying to lose weight through diet and exercise. She denies chest pain, shortness of breath, orthopnea, PND, presyncope, syncope, or palpitations.   ROS: See HPI       Studies Reviewed: .         Risk Assessment/Calculations:             Physical Exam:   VS:  BP 114/64   Pulse 74   Ht 5\' 3"  (1.6 m)   Wt 147 lb 12.8 oz (67 kg)   LMP 01/25/2014 (Approximate)   SpO2 94%   BMI 26.18 kg/m    Wt Readings from Last 3 Encounters:  02/03/23 147 lb 12.8  oz (67 kg)  12/29/22 148 lb (67.1 kg)  11/05/22 150 lb (68 kg)    GEN: Well nourished, well developed in no acute distress NECK: No JVD; No carotid bruits CARDIAC: RRR, no murmurs, rubs, gallops RESPIRATORY:  Clear to auscultation without rales, wheezing or rhonchi  ABDOMEN: Soft, non-tender, non-distended EXTREMITIES:  No edema; No deformity     ASSESSMENT AND PLAN: .    Elevated lipoprotein a: Elevated LDL and lipoprotein a. Consider PCSK9 inhibitors (Repatha, Praluent, or Leqvio) for elevated Lipoprotein A, pending insurance approval.  Dyslipidemia LDL goal < 70: Elevated LDL and Lipoprotein a. She has not yet started rosuvastatin. Calcium score  is zero indicating no calcified plaque. Encouraged her to go ahead and start rosuvastatin, to be taken with heaviest meal of the day. We will try to get PCSK9 inhibitors therapy started (Repatha, Praluent, or Leqvio) for elevated Lp(a), pending insurance approval. Plan to retest cholesterol after 2-3 months on therapy.   Weight management: She is actively trying to lose weight, has lost 10 pounds but has plateaued. Encouraged continuation of healthy diet and exercise regimen. Consider incorporating weightlifting into exercise routine.       Dispo: 6 months with Dr. Duke Salvia or APP  Signed, Eligha Bridegroom, NP-C

## 2023-02-03 NOTE — Progress Notes (Unsigned)
[facility] [address] [place] [address] Phone: [phone] Subjective:   Stephanie [name], am serving as a scribe for Dr. Antoine Primas.  I'm seeing this patient by the request  of:  Stephanie Inch, MD  CC: Low back and Achilles pain.  [id number]  Stephanie [name] is a 57 y.o. female coming in with complaint of back and neck pain. OMT on 11/05/2022. Also seen for ankle pain. Patient states same per usual. Arthritis in hand, vertigo is annoying, exercises for lower back has some questions.  Medications patient has been prescribed:   Taking:      Last ultrasound showed more irritation of the posterior tibialis tendon sheath of the left ankle but the Achilles appeared to be unremarkable.   Reviewed prior external information including notes and imaging from previsou exam, outside providers and external EMR if available.   As well as notes that were available from care everywhere and other healthcare systems.  Past medical history, social, surgical and family history all reviewed in electronic medical record.  No pertanent information unless stated regarding to the chief complaint.   Past Medical History:  Diagnosis Date   Anxiety    Breast mass, left    Cancer (HCC)    Complication of anesthesia    felt lethargic x 4 days following last surgery in 2017, but 2018 did okay   Depression    Family history of premature CAD 12/29/2022   Headache    migraines   History of radiation therapy 03/13/19- 04/11/19   Left Breast 16 fraction of 2. 66 Gy each to total 42.56 Gy. Left breast boost 4 fractions of 2 Gy to total 8 Gy.    Osteopenia    Pure hypercholesterolemia 12/29/2022    Allergies  Allergen Reactions   Wellbutrin Xl [Bupropion] Hives     Review of Systems:  No headache, visual changes, nausea, vomiting, diarrhea, constipation, abdominal pain, skin rash, fevers, chills, night sweats, weight loss, swollen lymph nodes, body  aches, joint swelling, chest pain, shortness of breath, mood changes. POSITIVE muscle aches, mild dizziness thinks it is secondary to her allergies.  Objective  Blood pressure 108/70, pulse 89, height 5\' 3"  (1.6 m), weight 148 lb (67.1 kg), last menstrual period 01/25/2014, SpO2 98%.   General: No apparent distress alert and oriented x3 mood and affect normal, dressed appropriately.  HEENT: Pupils equal, extraocular movements intact  Respiratory: Patient's speak in full sentences and does not appear short of breath  Cardiovascular: No lower extremity edema, non tender, no erythema  TTP noted over the sacroiliac joint.  Patient does have some tightness noted in the neck as well. Right CMC joint mild positive grind test noted.  Significant hypermobility.  Does have some laxity of the UCL  Osteopathic findings C3 flexed rotated and side bent right C7 flexed rotated and side bent left T3 extended rotated and side bent right inhaled rib T7 extended rotated and side bent left L1 flexed rotated and side bent right Sacrum right on right    Assessment and Plan:  SI (sacroiliac) joint dysfunction Hypermobility still noted.  Discussed posture and ergonomics.  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  Thumb spica cast given to help wear at night.  Worsening pain can consider injection.  Nonallopathic problems  Decision today to treat with OMT was based on Physical Exam  After verbal consent patient was treated with HVLA, ME, FPR techniques in cervical, rib, thoracic, lumbar, and sacral  areas  Patient tolerated the procedure well with improvement in symptoms  Patient given exercises, stretches and lifestyle modifications  See medications in patient instructions if given  Patient will follow up in 4-8 weeks    The  above documentation has been reviewed and is accurate and complete Judi Saa, DO          Note: This dictation was prepared with Dragon dictation along with smaller phrase technology. Any transcriptional errors that result from this process are unintentional.

## 2023-02-03 NOTE — Patient Instructions (Signed)
Medication Instructions:   Take one injection ( 140 mg) into the skin every 14 days.   *If you need a refill on your cardiac medications before your next appointment, please call your pharmacy*   Lab Work:  Will call if your Repatha gets approved to get fasting labs in 2-3 months.   If you have labs (blood work) drawn today and your tests are completely normal, you will receive your results only by: MyChart Message (if you have MyChart) OR A paper copy in the mail If you have any lab test that is abnormal or we need to change your treatment, we will call you to review the results.   Testing/Procedures:  None ordered.   Follow-Up: At [facility], you and your health needs are our priority.  As part of our continuing mission to provide you with exceptional heart care, we have created designated Provider Care Teams.  These Care Teams include your primary Cardiologist (physician) and Advanced Practice Providers (APPs -  Physician Assistants and Nurse Practitioners) who all work together to provide you with the care you need, when you need it.  We recommend signing up for the patient portal called "MyChart".  Sign up information is provided on this After Visit Summary.  MyChart is used to connect with patients for Virtual Visits (Telemedicine).  Patients are able to view lab/test results, encounter notes, upcoming appointments, etc.  Non-urgent messages can be sent to your provider as well.   To learn more about what you can do with MyChart, go to [email or web address].    Your next appointment:   6 month(s)  Provider:   Chilton Si, MD or Lebron Conners

## 2023-02-04 ENCOUNTER — Ambulatory Visit: Payer: Commercial Managed Care - PPO | Admitting: Family Medicine

## 2023-02-04 VITALS — BP 108/70 | HR 89 | Ht 63.0 in | Wt 148.0 lb

## 2023-02-04 DIAGNOSIS — M533 Sacrococcygeal disorders, not elsewhere classified: Secondary | ICD-10-CM

## 2023-02-04 DIAGNOSIS — M9904 Segmental and somatic dysfunction of sacral region: Secondary | ICD-10-CM | POA: Diagnosis not present

## 2023-02-04 DIAGNOSIS — M9903 Segmental and somatic dysfunction of lumbar region: Secondary | ICD-10-CM | POA: Diagnosis not present

## 2023-02-04 DIAGNOSIS — M9901 Segmental and somatic dysfunction of cervical region: Secondary | ICD-10-CM

## 2023-02-04 DIAGNOSIS — M9902 Segmental and somatic dysfunction of thoracic region: Secondary | ICD-10-CM | POA: Diagnosis not present

## 2023-02-04 DIAGNOSIS — M9908 Segmental and somatic dysfunction of rib cage: Secondary | ICD-10-CM | POA: Diagnosis not present

## 2023-02-04 DIAGNOSIS — M1811 Unilateral primary osteoarthritis of first carpometacarpal joint, right hand: Secondary | ICD-10-CM | POA: Insufficient documentation

## 2023-02-04 NOTE — Assessment & Plan Note (Signed)
Mild CMC arthritis, hypermobility noted as well though.  Thumb spica cast given to help wear at night.  Worsening pain can consider injection.

## 2023-02-04 NOTE — Patient Instructions (Signed)
Brace to wear at night Vertical mouse Do prescribed exercises at least 3x a week Try the back stretches See you again in 2 months

## 2023-02-04 NOTE — Assessment & Plan Note (Signed)
Hypermobility still noted.  Discussed posture and ergonomics.  Discussed which activities to do and which ones to avoid follow-up again in 6 to 8 weeks

## 2023-02-09 ENCOUNTER — Encounter: Payer: Self-pay | Admitting: Hematology and Oncology

## 2023-02-19 ENCOUNTER — Other Ambulatory Visit (HOSPITAL_BASED_OUTPATIENT_CLINIC_OR_DEPARTMENT_OTHER): Payer: Self-pay

## 2023-02-19 ENCOUNTER — Encounter: Payer: Self-pay | Admitting: Infectious Diseases

## 2023-02-19 ENCOUNTER — Encounter: Payer: Self-pay | Admitting: Oncology

## 2023-02-19 MED ORDER — INFLUENZA VIRUS VACC SPLIT PF (FLUZONE) 0.5 ML IM SUSY
0.5000 mL | PREFILLED_SYRINGE | Freq: Once | INTRAMUSCULAR | 0 refills | Status: AC
  Filled 2023-02-19: qty 0.5, 1d supply, fill #0

## 2023-02-22 ENCOUNTER — Other Ambulatory Visit (HOSPITAL_BASED_OUTPATIENT_CLINIC_OR_DEPARTMENT_OTHER): Payer: Self-pay

## 2023-02-22 ENCOUNTER — Encounter (INDEPENDENT_AMBULATORY_CARE_PROVIDER_SITE_OTHER): Payer: Commercial Managed Care - PPO

## 2023-02-22 DIAGNOSIS — N644 Mastodynia: Secondary | ICD-10-CM | POA: Diagnosis not present

## 2023-02-23 NOTE — Telephone Encounter (Signed)
Please see the MyChart message reply(ies) for my assessment and plan.    This patient gave consent for this Medical Advice Message and is aware that it may result in a bill to [employer], as well as the possibility of receiving a bill for a co-payment or deductible. They are an established patient, but are not seeking medical advice exclusively about a problem treated during an in person or video visit in the last seven days. I did not recommend an in person or video visit within seven days of my reply.    I spent a total of 20 minutes cumulative time within 7 days through [employer].  Rexene Alberts, NP

## 2023-03-02 ENCOUNTER — Encounter (HOSPITAL_BASED_OUTPATIENT_CLINIC_OR_DEPARTMENT_OTHER): Payer: Self-pay

## 2023-03-22 DIAGNOSIS — F411 Generalized anxiety disorder: Secondary | ICD-10-CM | POA: Diagnosis not present

## 2023-03-23 ENCOUNTER — Ambulatory Visit: Payer: Commercial Managed Care - PPO | Admitting: Nurse Practitioner

## 2023-04-01 ENCOUNTER — Encounter (HOSPITAL_BASED_OUTPATIENT_CLINIC_OR_DEPARTMENT_OTHER): Payer: Self-pay | Admitting: Cardiovascular Disease

## 2023-04-07 NOTE — Progress Notes (Unsigned)
  [facility] [address] [place] [address] Phone: [phone] Subjective:    I'm seeing this patient by the request  of:  Eartha Inch, MD  CC:   [id number]  RAFAELLA SHADDUCK is a 57 y.o. female coming in with complaint of back and neck pain. OMT on 02/04/2023. Patient states   Medications patient has been prescribed:   Taking:         Reviewed prior external information including notes and imaging from previsou exam, outside providers and external EMR if available.   As well as notes that were available from care everywhere and other healthcare systems.  Past medical history, social, surgical and family history all reviewed in electronic medical record.  No pertanent information unless stated regarding to the chief complaint.   Past Medical History:  Diagnosis Date   Anxiety    Breast mass, left    Cancer (HCC)    Complication of anesthesia    felt lethargic x 4 days following last surgery in 2017, but 2018 did okay   Depression    Family history of premature CAD 12/29/2022   Headache    migraines   History of radiation therapy 03/13/19- 04/11/19   Left Breast 16 fraction of 2. 66 Gy each to total 42.56 Gy. Left breast boost 4 fractions of 2 Gy to total 8 Gy.    Osteopenia    Pure hypercholesterolemia 12/29/2022    Allergies  Allergen Reactions   Wellbutrin Xl [Bupropion] Hives     Review of Systems:  No headache, visual changes, nausea, vomiting, diarrhea, constipation, dizziness, abdominal pain, skin rash, fevers, chills, night sweats, weight loss, swollen lymph nodes, body aches, joint swelling, chest pain, shortness of breath, mood changes. POSITIVE muscle aches  Objective  Last menstrual period 01/25/2014.   General: No apparent distress alert and oriented x3 mood and affect normal, dressed appropriately.  HEENT: Pupils equal, extraocular movements intact  Respiratory: Patient's speak in full sentences and  does not appear short of breath  Cardiovascular: No lower extremity edema, non tender, no erythema  Gait MSK:  Back   Osteopathic findings  C2 flexed rotated and side bent right C6 flexed rotated and side bent left T3 extended rotated and side bent right inhaled rib T9 extended rotated and side bent left L2 flexed rotated and side bent right Sacrum right on right       Assessment and Plan:  No problem-specific Assessment & Plan notes found for this encounter.    Nonallopathic problems  Decision today to treat with OMT was based on Physical Exam  After verbal consent patient was treated with HVLA, ME, FPR techniques in cervical, rib, thoracic, lumbar, and sacral  areas  Patient tolerated the procedure well with improvement in symptoms  Patient given exercises, stretches and lifestyle modifications  See medications in patient instructions if given  Patient will follow up in 4-8 weeks    The above documentation has been reviewed and is accurate and complete Judi Saa, DO          Note: This dictation was prepared with Dragon dictation along with smaller phrase technology. Any transcriptional errors that result from this process are unintentional.

## 2023-04-08 ENCOUNTER — Ambulatory Visit: Payer: Commercial Managed Care - PPO | Admitting: Family Medicine

## 2023-04-09 ENCOUNTER — Encounter: Payer: Self-pay | Admitting: Hematology and Oncology

## 2023-04-12 ENCOUNTER — Encounter: Payer: Self-pay | Admitting: Oncology

## 2023-04-12 ENCOUNTER — Encounter: Payer: Self-pay | Admitting: Infectious Diseases

## 2023-04-12 ENCOUNTER — Other Ambulatory Visit (HOSPITAL_BASED_OUTPATIENT_CLINIC_OR_DEPARTMENT_OTHER): Payer: Self-pay

## 2023-04-15 ENCOUNTER — Other Ambulatory Visit (HOSPITAL_BASED_OUTPATIENT_CLINIC_OR_DEPARTMENT_OTHER): Payer: Self-pay

## 2023-04-26 DIAGNOSIS — F411 Generalized anxiety disorder: Secondary | ICD-10-CM | POA: Diagnosis not present

## 2023-04-27 DIAGNOSIS — L812 Freckles: Secondary | ICD-10-CM | POA: Diagnosis not present

## 2023-04-27 DIAGNOSIS — L821 Other seborrheic keratosis: Secondary | ICD-10-CM | POA: Diagnosis not present

## 2023-04-27 DIAGNOSIS — L649 Androgenic alopecia, unspecified: Secondary | ICD-10-CM | POA: Diagnosis not present

## 2023-04-27 DIAGNOSIS — D2272 Melanocytic nevi of left lower limb, including hip: Secondary | ICD-10-CM | POA: Diagnosis not present

## 2023-04-27 DIAGNOSIS — D2271 Melanocytic nevi of right lower limb, including hip: Secondary | ICD-10-CM | POA: Diagnosis not present

## 2023-04-27 DIAGNOSIS — L820 Inflamed seborrheic keratosis: Secondary | ICD-10-CM | POA: Diagnosis not present

## 2023-04-27 DIAGNOSIS — D1801 Hemangioma of skin and subcutaneous tissue: Secondary | ICD-10-CM | POA: Diagnosis not present

## 2023-05-03 ENCOUNTER — Other Ambulatory Visit (HOSPITAL_BASED_OUTPATIENT_CLINIC_OR_DEPARTMENT_OTHER): Payer: Self-pay

## 2023-05-04 ENCOUNTER — Other Ambulatory Visit (HOSPITAL_BASED_OUTPATIENT_CLINIC_OR_DEPARTMENT_OTHER): Payer: Self-pay

## 2023-05-04 ENCOUNTER — Other Ambulatory Visit: Payer: Self-pay | Admitting: Hematology and Oncology

## 2023-05-04 ENCOUNTER — Other Ambulatory Visit: Payer: Self-pay

## 2023-05-04 ENCOUNTER — Other Ambulatory Visit: Payer: Self-pay | Admitting: Adult Health

## 2023-05-04 MED ORDER — TAMOXIFEN CITRATE 20 MG PO TABS
20.0000 mg | ORAL_TABLET | Freq: Every day | ORAL | 11 refills | Status: DC
  Filled 2023-05-04: qty 30, 30d supply, fill #0
  Filled 2023-06-03: qty 30, 30d supply, fill #1

## 2023-05-04 MED ORDER — VENLAFAXINE HCL ER 75 MG PO CP24
75.0000 mg | ORAL_CAPSULE | Freq: Every day | ORAL | 1 refills | Status: DC
  Filled 2023-05-04: qty 90, 90d supply, fill #0
  Filled 2023-08-20: qty 90, 90d supply, fill #1

## 2023-05-05 ENCOUNTER — Other Ambulatory Visit (HOSPITAL_BASED_OUTPATIENT_CLINIC_OR_DEPARTMENT_OTHER): Payer: Self-pay

## 2023-05-18 DIAGNOSIS — F411 Generalized anxiety disorder: Secondary | ICD-10-CM | POA: Diagnosis not present

## 2023-05-25 ENCOUNTER — Telehealth: Payer: Self-pay | Admitting: Hematology and Oncology

## 2023-05-25 NOTE — Telephone Encounter (Signed)
Spoke with patient confirming upcoming appointment change

## 2023-05-28 ENCOUNTER — Other Ambulatory Visit (HOSPITAL_BASED_OUTPATIENT_CLINIC_OR_DEPARTMENT_OTHER): Payer: Self-pay

## 2023-05-28 ENCOUNTER — Telehealth: Payer: Commercial Managed Care - PPO | Admitting: Family Medicine

## 2023-05-28 DIAGNOSIS — J111 Influenza due to unidentified influenza virus with other respiratory manifestations: Secondary | ICD-10-CM | POA: Diagnosis not present

## 2023-05-28 MED ORDER — PROMETHAZINE-DM 6.25-15 MG/5ML PO SYRP
5.0000 mL | ORAL_SOLUTION | Freq: Four times a day (QID) | ORAL | 0 refills | Status: AC | PRN
Start: 2023-05-28 — End: 2023-06-07
  Filled 2023-05-28: qty 118, 6d supply, fill #0

## 2023-05-28 MED ORDER — OSELTAMIVIR PHOSPHATE 75 MG PO CAPS
75.0000 mg | ORAL_CAPSULE | Freq: Two times a day (BID) | ORAL | 0 refills | Status: AC
  Filled 2023-05-28: qty 10, 5d supply, fill #0

## 2023-05-28 NOTE — Progress Notes (Signed)
E visit for Flu like symptoms   We are sorry that you are not feeling well.  Here is how we plan to help! Based on what you have shared with me it looks like you may have a respiratory virus that may be influenza.  Influenza or "the flu" is   an infection caused by a respiratory virus. The flu virus is highly contagious and persons who did not receive their yearly flu vaccination may "catch" the flu from close contact.  We have anti-viral medications to treat the viruses that cause this infection. They are not a "cure" and only shorten the course of the infection. These prescriptions are most effective when they are given within the first 2 days of "flu" symptoms. Antiviral medication are indicated if you have a high risk of complications from the flu. You should  also consider an antiviral medication if you are in close contact with someone who is at risk. These medications can help patients avoid complications from the flu  but have side effects that you should know. Possible side effects from Tamiflu or oseltamivir include nausea, vomiting, diarrhea, dizziness, headaches, eye redness, sleep problems or other respiratory symptoms. You should not take Tamiflu if you have an allergy to oseltamivir or any to the ingredients in Tamiflu.  Based upon your symptoms and potential risk factors I have prescribed Oseltamivir (Tamiflu).  It has been sent to your designated pharmacy.  You will take one 75 mg capsule orally twice a day for the next 5 days.  I have also sent promethazine dm.  ANYONE WHO HAS FLU SYMPTOMS SHOULD: Stay home. The flu is highly contagious and going out or to work exposes others! Be sure to drink plenty of fluids. Water is fine as well as fruit juices, sodas and electrolyte beverages. You may want to stay away from caffeine or alcohol. If you are nauseated, try taking small sips of liquids. How do you know if you are getting enough fluid? Your urine should be a pale yellow or almost  colorless. Get rest. Taking a steamy shower or using a humidifier may help nasal congestion and ease sore throat pain. Using a saline nasal spray works much the same way. Cough drops, hard candies and sore throat lozenges may ease your cough. Line up a caregiver. Have someone check on you regularly.   GET HELP RIGHT AWAY IF: You cannot keep down liquids or your medications. You become short of breath Your fell like you are going to pass out or loose consciousness. Your symptoms persist after you have completed your treatment plan MAKE SURE YOU  Understand these instructions. Will watch your condition. Will get help right away if you are not doing well or get worse.  Your e-visit answers were reviewed by a board certified advanced clinical practitioner to complete your personal care plan.  Depending on the condition, your plan could have included both over the counter or prescription medications.  If there is a problem please reply  once you have received a response from your provider.  Your safety is important to [place].  If you have drug allergies check your prescription carefully.    You can use MyChart to ask questions about today's visit, request a non-urgent call back, or ask for a work or school excuse for 24 hours related to this e-Visit. If it has been greater than 24 hours you will need to follow up with your provider, or enter a new e-Visit to address those concerns.  You  will get an e-mail in the next two days asking about your experience.  I hope that your e-visit has been valuable and will speed your recovery. Thank you for using e-visits.    have provided 5 minutes of non face to face time during this encounter for chart review and documentation.

## 2023-05-31 ENCOUNTER — Encounter (HOSPITAL_BASED_OUTPATIENT_CLINIC_OR_DEPARTMENT_OTHER): Payer: Self-pay

## 2023-05-31 ENCOUNTER — Encounter: Payer: Self-pay | Admitting: Hematology and Oncology

## 2023-06-02 DIAGNOSIS — E7800 Pure hypercholesterolemia, unspecified: Secondary | ICD-10-CM | POA: Diagnosis not present

## 2023-06-03 ENCOUNTER — Encounter: Payer: Self-pay | Admitting: Hematology and Oncology

## 2023-06-03 ENCOUNTER — Encounter (HOSPITAL_BASED_OUTPATIENT_CLINIC_OR_DEPARTMENT_OTHER): Payer: Self-pay

## 2023-06-03 ENCOUNTER — Other Ambulatory Visit: Payer: Self-pay

## 2023-06-03 ENCOUNTER — Other Ambulatory Visit (HOSPITAL_BASED_OUTPATIENT_CLINIC_OR_DEPARTMENT_OTHER): Payer: Self-pay

## 2023-06-03 ENCOUNTER — Inpatient Hospital Stay: Payer: Commercial Managed Care - PPO | Attending: Hematology and Oncology | Admitting: Hematology and Oncology

## 2023-06-03 ENCOUNTER — Ambulatory Visit: Payer: Commercial Managed Care - PPO | Admitting: Hematology and Oncology

## 2023-06-03 VITALS — BP 112/61 | HR 95 | Temp 97.2°F | Resp 16 | Wt 148.0 lb

## 2023-06-03 DIAGNOSIS — Z170 Estrogen receptor positive status [ER+]: Secondary | ICD-10-CM | POA: Diagnosis not present

## 2023-06-03 DIAGNOSIS — Z923 Personal history of irradiation: Secondary | ICD-10-CM | POA: Insufficient documentation

## 2023-06-03 DIAGNOSIS — Z79810 Long term (current) use of selective estrogen receptor modulators (SERMs): Secondary | ICD-10-CM | POA: Diagnosis not present

## 2023-06-03 DIAGNOSIS — Z1721 Progesterone receptor positive status: Secondary | ICD-10-CM | POA: Diagnosis not present

## 2023-06-03 DIAGNOSIS — C50412 Malignant neoplasm of upper-outer quadrant of left female breast: Secondary | ICD-10-CM | POA: Diagnosis not present

## 2023-06-03 DIAGNOSIS — N61 Mastitis without abscess: Secondary | ICD-10-CM | POA: Diagnosis not present

## 2023-06-03 DIAGNOSIS — D0512 Intraductal carcinoma in situ of left breast: Secondary | ICD-10-CM | POA: Insufficient documentation

## 2023-06-03 LAB — HEPATIC FUNCTION PANEL
ALT: 17 [IU]/L (ref 0–32)
AST: 30 [IU]/L (ref 0–40)
Albumin: 4.2 g/dL (ref 3.8–4.9)
Alkaline Phosphatase: 64 [IU]/L (ref 44–121)
Bilirubin Total: 0.2 mg/dL (ref 0.0–1.2)
Bilirubin, Direct: 0.09 mg/dL (ref 0.00–0.40)
Total Protein: 6.4 g/dL (ref 6.0–8.5)

## 2023-06-03 LAB — LIPID PANEL
Chol/HDL Ratio: 2.4 {ratio} (ref 0.0–4.4)
Cholesterol, Total: 110 mg/dL (ref 100–199)
HDL: 45 mg/dL (ref >39)
LDL Chol Calc (NIH): 37 mg/dL (ref 0–99)
Triglycerides: 169 mg/dL — ABNORMAL HIGH (ref 0–149)
VLDL Cholesterol Cal: 28 mg/dL (ref 5–40)

## 2023-06-03 MED ORDER — TAMOXIFEN CITRATE 20 MG PO TABS
20.0000 mg | ORAL_TABLET | Freq: Every day | ORAL | 3 refills | Status: AC
  Filled 2023-06-04: qty 90, 90d supply, fill #0
  Filled 2023-08-12 – 2023-08-17 (×2): qty 90, 90d supply, fill #1
  Filled 2023-11-16: qty 90, 90d supply, fill #2
  Filled 2024-03-03: qty 90, 90d supply, fill #3

## 2023-06-03 NOTE — Progress Notes (Signed)
 [facility]  Telephone:([phone]) [phone] [phone]) [phone]     ID: LEEBA BARBE DOB: 24-Jun-1965  MR#: [id number]  RDW#:[id number]  Patient Care Team: Sophronia Ozell BROCKS, MD as PCP - General (Family Medicine) Wonda Cy BROCKS, RD as Dietitian (Family Medicine) Fredirick Glenys RAMAN, MD as Consulting Physician (Obstetrics and Gynecology) Ebbie Cough, MD as Consulting Physician (General Surgery) Izell Domino, MD as Attending Physician (Radiation Oncology) Loretha Ash, MD as Consulting Physician (Hematology and Oncology) Prentiss Prentice SAILOR, DO (Inactive) as Consulting Physician (Infectious Diseases) Melvenia Corean SAILOR, NP as Nurse Practitioner (Infectious Diseases) Ash Loretha, MD  CHIEF COMPLAINT: Ductal carcinoma in situ  CURRENT TREATMENT: tamoxifen    INTERVAL HISTORY:  Discussed the use of AI scribe software for clinical note transcription with the patient, who gave verbal consent to proceed.  History of Present Illness         Stephanie [name] is a 58 year old female who presents for follow-up on recurrent cellulitis and weight management.  She has experienced recurrent episodes of breast cellulitis, with the most severe episode occurring in the summer, necessitating IV antibiotics after oral Duricef proved ineffective. The episode was marked by significant inflammation, chills, fever, and reduced energy levels to about seventy-five percent. Since the summer, the episodes have been less frequent and severe.  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 During a recent flu episode, she experienced an unusual sweating pattern, sweating only from her knees down, and is unsure if this is related to tamoxifen . She has not experienced any spotting or other symptoms that would necessitate a uterine ultrasound or hysteroscopy.  She has been actively managing her weight, having lost approximately fifteen pounds over the past  year, with her current weight around 143 pounds and a goal of reaching 140 pounds. Her weight loss is attributed to dietary changes, including a focus on vegetables, chicken, and a structured eating plan from [email or web address][email or web address]/, involving protein, complex carbohydrates, and vegetables. She has also experimented with intermittent fasting and protein shakes.  Last mammogram in September 2024, BI-RADS Category 1, negative for malignancy. Bone density Sep 2023 normal. Rest of the pertinent 10 point ROS reviewed and neg.  HISTORY OF CURRENT ILLNESS: From the original intake note:  Stephanie [name] has a history of left breast lumpectomy ([id number]) performed on 08/15/2015 for fibroscystic changes with calcifications. She underwent a second left breast lumpectomy on 12/30/2016 [phone]) for lobular neoplasia, complex sclerosing lesion, and fibrocystic change and sclerosing adenosis with calcifications.  She had routine screening mammography on 12/14/2017 showing a possible abnormality in the left breast. She underwent left diagnostic mammogram on 01/27/2018, which showed: breast density category C; probably benign left breast calcifications, most likely fat necrosis (measurements not given). Short term follow up was recommended.  She returned for follow up on 09/29/2018 (likely delayed due to the pandemic) and underwent diagnostic left mammogram. This showed: breast density category C; unchanged 1.5 cm group of calcifications at the lumpectomy site; 1.2 cm group of calcifications within the anterior upper-outer left breast. Short term follow up was again recommended.  She bilateral diagnostic mammography with tomography at [facility] on 01/03/2019 showing: breast density category C; stable probably benign calcifications at the left lumpectomy site; 1.5 cm grouped calcifications within the upper-outer quadrant of the left breast with suspicious morphology and distribution; no evidence of right breast malignancy.    Accordingly on 01/06/2019 she proceeded to biopsy of the left breast area  in question. The pathology from this procedure ([id number]) showed: ductal carcinoma in situ, high grade. Prognostic indicators significant for: estrogen receptor, 100% positive and progesterone receptor, 80% positive, both with strong staining intensity.   She underwent genetic counseling on 01/16/2019, which showed negative results.  She also underwent biopsy of the calcifications at the left lumpectomy site on 01/17/2019. Pathology [id number]) showed: focal fibrocystic changes; dense fibrosis with pigmented histiocytes and foreign body giant cells.  She opted to proceed with left breast lumpectomy on 02/09/2019 under Dr. Ebbie. Pathology from the procedure ([id number]) showed:  1. Left Breast, lumpectomy  - fibrocystic changes 2. Left Breast, lumpectomy  - ductal carcinoma in situ with calcifications, intermediate grade, spanning 2.4 cm  - lobular neoplasia  -negative resection margins   The patient's subsequent history is as detailed below.   PAST MEDICAL HISTORY: Past Medical History:  Diagnosis Date   Anxiety    Breast mass, left    Cancer (HCC)    Complication of anesthesia    felt lethargic x 4 days following last surgery in 2017, but 2018 did okay   Depression    Family history of premature CAD 12/29/2022   Headache    migraines   History of radiation therapy 03/13/19- 04/11/19   Left Breast 16 fraction of 2. 66 Gy each to total 42.56 Gy. Left breast boost 4 fractions of 2 Gy to total 8 Gy.    Osteopenia    Pure hypercholesterolemia 12/29/2022    PAST SURGICAL HISTORY: Past Surgical History:  Procedure Laterality Date   BREAST BIOPSY Left 07/10/2015   high risk stereo   BREAST EXCISIONAL BIOPSY Left 08/15/2015   ADH   BREAST LUMPECTOMY Left 02/09/2019   BREAST LUMPECTOMY WITH RADIOACTIVE SEED LOCALIZATION Left 08/15/2015   Procedure:  LEFT BREAST LUMPECTOMY WITH RADIOACTIVE SEED  LOCALIZATION;  Surgeon: Debby Shipper, MD;  Location: [facility];  Service: General;  Laterality: Left;   BREAST LUMPECTOMY WITH RADIOACTIVE SEED LOCALIZATION Left 02/09/2019   Procedure: LEFT BREAST LUMPECTOMY WITH RADIOACTIVE SEED LOCALIZATION;  Surgeon: Ebbie Cough, MD;  Location: [facility] OR;  Service: General;  Laterality: Left;   EXCISION OF BREAST BIOPSY Left 12/30/2016   Benign ALH sclerosing lesion   RADIOACTIVE SEED GUIDED EXCISIONAL BREAST BIOPSY Left 12/30/2016   Procedure: RADIOACTIVE SEED GUIDED EXCISIONAL LEFT BREAST BIOPSY;  Surgeon: Ebbie Cough, MD;  Location: [facility];  Service: General;  Laterality: Left;   RADIOACTIVE SEED GUIDED EXCISIONAL BREAST BIOPSY Left 02/09/2019   Procedure: RADIOACTIVE SEED GUIDED EXCISIONAL LEFT  BREAST BIOPSY;  Surgeon: Ebbie Cough, MD;  Location: [facility] OR;  Service: General;  Laterality: Left;   TONSILLECTOMY  Age 41   TRIGGER FINGER RELEASE  2010   Right Thumb    FAMILY HISTORY: Family History  Problem Relation Age of Onset   Hypertension Mother    Osteoporosis Mother    Congenital heart disease Father        VSD   Depression Brother    Atrial fibrillation Brother    Coronary artery disease Brother    Coronary artery disease Brother    Atrial fibrillation Brother    Parkinson's disease Maternal Aunt    Diabetes Maternal Grandmother    Diabetes Maternal Grandfather    Diabetes Paternal Grandmother    Diabetes Paternal Grandfather    ADD / ADHD Daughter    ADD / ADHD Son   Patient's father was 94 years old when he died from complications of cardiomyopathy.  He was  adopted and there is no family history on his side. Patient's mother is 21 years old as of November 2020. The patient has 3 brothers, no sisters.  She denies/or notes a family hx of breast, prostate, pancreatic or ovarian cancer.    GYNECOLOGIC HISTORY:  Patient's last menstrual period was 01/25/2014 (approximate). Menarche: 58  years old Age at first live birth: Any 58 years old GX P 2 LMP 49 Contraceptive oral contraceptives at least 7 years, with no complications HRT 9 months, discontinued because of malaise  Hysterectomy? no BSO?  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  Her husband Koren works in arts administrator..  Daughter Vernell, 23, lives in [place] and works as a programmer, systems.  Son Palmer, [place], with Asperger's, currently attends [employer], and lives at home with the patient.     ADVANCED DIRECTIVES: In the absence of any documents to the contrary the patient's husband is her healthcare power of attorney   HEALTH MAINTENANCE: Social History   Tobacco Use   Smoking status: Never   Smokeless tobacco: Never  Vaping Use   Vaping status: Never Used  Substance Use Topics   Alcohol use: No    Comment: rare use   Drug use: No     Colonoscopy: not on file  PAP: 08/2017, negative  Bone density: 11/2017, -2.0   Allergies  Allergen Reactions   Wellbutrin  Xl [Bupropion ] Hives    Current Outpatient Medications  Medication Sig Dispense Refill   Biotin w/ Vitamins C & E (HAIR/SKIN/NAILS PO) Take 1 tablet by mouth daily.      Calcium  Carb-Cholecalciferol  (CALCIUM  + D3 PO) Take 1 tablet by mouth daily.      CALCIUM  CITRATE PO Take 1,200 mg by mouth daily.     cefadroxil  (DURICEF) 500 MG capsule Take 2 capsules (1,000 mg total) by mouth 2 (two) times daily. 20 capsule 2   Evolocumab  (REPATHA  SURECLICK) 140 MG/ML SOAJ Inject 140 mg into the skin every 14 (fourteen) days. 2 mL 2   gabapentin  (NEURONTIN ) 300 MG capsule Take 1 capsule (300 mg total) by mouth at bedtime. 90 capsule 4   promethazine -dextromethorphan (PROMETHAZINE -DM) 6.25-15 MG/5ML syrup Take 5 mLs by mouth 4 (four) times daily as needed for up to 10 days for cough. 118 mL 0   rosuvastatin   (CRESTOR ) 10 MG tablet Take 1 tablet (10 mg total) by mouth daily. 90 tablet 3   tamoxifen  (NOLVADEX ) 20 MG tablet Take 1 tablet (20 mg total) by mouth daily. 30 tablet 11   triamcinolone  cream (KENALOG ) 0.1 % Apply to affected area 2 (two) times daily. 30 g 0   venlafaxine  XR (EFFEXOR -XR) 75 MG 24 hr capsule Take 1 capsule (75 mg total) by mouth daily with breakfast. 90 capsule 1   No current facility-administered medications for this visit.    OBJECTIVE: White woman who appears younger than stated age Vitals:   06/03/23 1355  BP: 112/61  Pulse: 95  Resp: 16  Temp: (!) 97.2 F (36.2 C)  SpO2: 100%      Body mass index is 26.22 kg/m.   Wt Readings from Last 3 Encounters:  06/03/23 148 lb (67.1 kg)  02/04/23 148 lb (67.1 kg)  02/03/23 147 lb 12.8 oz (67 kg)     ECOG FS:1  Sclerae unicteric, EOMs intact Wearing  a mask No cervical or supraclavicular adenopathy Breasts: Bilateral breasts inspected. Left breast upper outer quadrant surgical changes, no palpable masses or regional adenopathy. Right breast inspected, normal on palpation No LE edema  LAB RESULTS:  CMP     Component Value Date/Time   NA 143 01/04/2023 0838   K 4.7 01/04/2023 0838   CL 102 01/04/2023 0838   CO2 24 01/04/2023 0838   GLUCOSE 88 01/04/2023 0838   GLUCOSE 145 (H) 10/15/2022 0931   BUN 13 01/04/2023 0838   CREATININE 0.70 01/04/2023 0838   CREATININE 0.75 05/29/2021 1430   CALCIUM  9.3 01/04/2023 0838   PROT 6.4 06/02/2023 0810   ALBUMIN 4.2 06/02/2023 0810   AST 30 06/02/2023 0810   AST 17 05/29/2021 1430   ALT 17 06/02/2023 0810   ALT 9 05/29/2021 1430   ALKPHOS 64 06/02/2023 0810   BILITOT 0.2 06/02/2023 0810   BILITOT 0.2 (L) 05/29/2021 1430   GFRNONAA >60 05/29/2021 1430   GFRAA >60 01/24/2020 1033    No results found for: TOTALPROTELP, ALBUMINELP, A1GS, A2GS, BETS, BETA2SER, GAMS, MSPIKE, SPEI  No results found for: JONATHAN BONG,  KAPLAMBRATIO  Lab Results  Component Value Date   WBC 5.4 10/15/2022   NEUTROABS 3.3 10/15/2022   HGB 13.0 10/15/2022   HCT 38.8 10/15/2022   MCV 90.5 10/15/2022   PLT 400.0 10/15/2022   No results found for: LABCA2  No components found for: [id number]  No results for input(s): INR in the last 168 hours.  No results found for: LABCA2  No results found for: RJW800  No results found for: CAN125  No results found for: CAN153  No results found for: CA2729  No components found for: HGQUANT  No results found for: CEA1, CEA / No results found for: CEA1, CEA   No results found for: AFPTUMOR  No results found for: CHROMOGRNA  No results found for: HGBA, HGBA2QUANT, HGBFQUANT, HGBSQUAN (Hemoglobinopathy evaluation)   No results found for: LDH  No results found for: IRON, TIBC, IRONPCTSAT (Iron and TIBC)  No results found for: FERRITIN  Urinalysis    Component Value Date/Time   LABSPEC 1.025 09/09/2009 1037   PHURINE 6.0 09/09/2009 1037   GLUCOSEU NEGATIVE 09/09/2009 1037   HGBUR TRACE (A) 09/09/2009 1037   BILIRUBINUR NEGATIVE 09/09/2009 1037   KETONESUR NEGATIVE 09/09/2009 1037   PROTEINUR NEGATIVE 09/09/2009 1037   UROBILINOGEN 0.2 09/09/2009 1037   NITRITE NEGATIVE 09/09/2009 1037   LEUKOCYTESUR  09/09/2009 1037    NEGATIVE Biochemical Testing Only. Please order routine urinalysis from main lab if confirmatory testing is needed.    STUDIES: No results found.    ELIGIBLE FOR AVAILABLE RESEARCH PROTOCOL: no  ASSESSMENT: 58 y.o. [place] woman status post left breast biopsy 01/06/2019 ductal carcinoma in situ, grade 3, estrogen and progesterone receptor positive  (a) biopsy of a second area of calcifications in the left breast on 01/17/2019 -  (1) status post left lumpectomy 02/09/2019 2.4 cm ductal carcinoma in situ, grade 2, with negative margins  (2) adjuvant radiation 03/13/2019 - 04/11/2019  (a)  left breast / 42.56 Gy in 16 fractions  (b) boost / 8 Gy in 4 fractions  (3) tamoxifen  started 04/28/2019  (4) genetics testing 01/19/2019 through the [facility] Breast Cancer STAT Panel + Common Hereditary Cancers Panel found no deleterious mutations in ATM, BRCA1, BRCA2, CDH1, CHEK2, PALB2, PTEN, STK11 and TP53 // APC, ATM, AXIN2, BARD1, BMPR1A, BRCA1, BRCA2, BRIP1, CDH1, CDKN2A (p14ARF), CDKN2A (p16INK4a), CKD4, CHEK2, CTNNA1, DICER1,  EPCAM (Deletion/duplication testing only), GREM1 (promoter region deletion/duplication testing only), KIT, MEN1, MLH1, MSH2, MSH3, MSH6, MUTYH, NBN, NF1, NHTL1, PALB2, PDGFRA, PMS2, POLD1, POLE, PTEN, RAD50, RAD51C, RAD51D, SDHB, SDHC, SDHD, SMAD4, SMARCA4. STK11, TP53, TSC1, TSC2, and VHL.  The following genes were evaluated for sequence changes only: SDHA and HOXB13 c.251G>A variant only.  (5) left breast cellulitis October 2022, requiring intravenous vancomycin , completed November 2022.  PLAN:  This is a pleasant 58 year old female patient with a left breast DCIS ER/PR positive status post left lumpectomy and adjuvant radiation in 2020, adjuvant tamoxifen  started on April 28, 2019, currently compliant with tamoxifen .  Recurrent Cellulitis Recent severe episode requiring IV antibiotics. No clear trigger identified. -Continue current management plan with Infectious Disease specialist.  Weight Management Successful weight loss of 15 pounds through dietary changes and exercise. -Encouraged to continue current regimen and consider varying routine to overcome weight loss plateau.  Breast Cancer (DCIS) On Tamoxifen  with no reported side effects. Completion of 5-year course anticipated in January 2026 -Plan to discontinue Tamoxifen  after 5 years as per guidelines for DCIS.  General Health Maintenance -Continue current health maintenance practices. -Consider uterine ultrasound or hysteroscopy if spotting or other symptoms develop.  Total time spent: 30  min  *Total Encounter Time as defined by the Centers for Medicare and Medicaid Services includes, in addition to the face-to-face time of a patient visit (documented in the note above) non-face-to-face time: obtaining and reviewing outside history, ordering and reviewing medications, tests or procedures, care coordination (communications with other health care professionals or caregivers) and documentation in the medical record.

## 2023-06-04 ENCOUNTER — Encounter: Payer: Self-pay | Admitting: Oncology

## 2023-06-04 ENCOUNTER — Other Ambulatory Visit (HOSPITAL_BASED_OUTPATIENT_CLINIC_OR_DEPARTMENT_OTHER): Payer: Self-pay

## 2023-06-04 ENCOUNTER — Encounter: Payer: Self-pay | Admitting: Infectious Diseases

## 2023-06-22 ENCOUNTER — Encounter: Payer: Self-pay | Admitting: Hematology and Oncology

## 2023-06-24 ENCOUNTER — Other Ambulatory Visit (HOSPITAL_BASED_OUTPATIENT_CLINIC_OR_DEPARTMENT_OTHER): Payer: Self-pay

## 2023-06-24 ENCOUNTER — Telehealth: Payer: Self-pay | Admitting: Hematology and Oncology

## 2023-06-24 MED ORDER — CEFADROXIL 500 MG PO CAPS
1000.0000 mg | ORAL_CAPSULE | Freq: Two times a day (BID) | ORAL | 2 refills | Status: DC
  Filled 2023-06-24: qty 40, 10d supply, fill #0

## 2023-06-24 NOTE — Telephone Encounter (Signed)
 Left patient a vm regarding upcoming appointment

## 2023-06-28 ENCOUNTER — Encounter: Payer: Self-pay | Admitting: Hematology and Oncology

## 2023-06-28 ENCOUNTER — Inpatient Hospital Stay: Payer: Commercial Managed Care - PPO | Attending: Hematology and Oncology | Admitting: Hematology and Oncology

## 2023-06-28 DIAGNOSIS — D0512 Intraductal carcinoma in situ of left breast: Secondary | ICD-10-CM

## 2023-06-28 NOTE — Progress Notes (Signed)
 Stephanie [name]  [phone] [phone]) [phone]     ID: Stephanie [name] DOB: [birth year]  MR#: [id number]  OVF#:[id number]  Patient Care Team: Stephanie Inch, MD as PCP - General (Family Medicine) Stephanie [name], RD as Dietitian (Family Medicine) Stephanie Bores, MD as Consulting Physician (Obstetrics and Gynecology) Stephanie Loron, MD as Consulting Physician (General Surgery) Stephanie Peak, MD as Attending Physician (Radiation Oncology) Stephanie Moulds, MD as Consulting Physician (Hematology and Oncology) Stephanie Grate, DO (Inactive) as Consulting Physician (Infectious Diseases) Stephanie Kelch, NP as Nurse Practitioner (Infectious Diseases) Stephanie Moulds, MD  CHIEF COMPLAINT: Ductal carcinoma in situ  CURRENT TREATMENT: tamoxifen   INTERVAL HISTORY:  Discussed the use of AI scribe software for clinical note transcription with the patient, who gave verbal consent to proceed.  History of Present Illness    Stephanie LEVAY "[name]" is a 58 year old female who presents for follow-up to review the necessity for ongoing tamoxifen since she is experiencing hair loss with tamoxifen. Since her last visit, she sent a my chart message that she is worried about ongoing hairloss and she wonders how much of her hair will be left by the time she finishes tamoxifen.  Hence we scheduled this follow up visit. Since her last message, she has pondered over this. She does report hair loss with tamoxifen, but at the same time she is worried about increased risk of recurrence without tamoxifen. She understands that its an important medication. She also mentions that her DCIS was kind of diagnosed within 6 months of normal mammogram and she worries about this too.   HISTORY OF CURRENT ILLNESS: From the original intake note:  Stephanie [name] has a history of left breast lumpectomy ([id number]) performed on 08/15/2015 for fibroscystic changes with calcifications. She  underwent a second left breast lumpectomy on 12/30/2016 [phone]) for lobular neoplasia, complex sclerosing lesion, and fibrocystic change and sclerosing adenosis with calcifications.  She had routine screening mammography on 12/14/2017 showing a possible abnormality in the left breast. She underwent left diagnostic mammogram on 01/27/2018, which showed: breast density category C; probably benign left breast calcifications, most likely fat necrosis (measurements not given). Short term follow up was recommended.  She returned for follow up on 09/29/2018 (likely delayed due to the pandemic) and underwent diagnostic left mammogram. This showed: breast density category C; unchanged 1.5 cm group of calcifications at the lumpectomy site; 1.2 cm group of calcifications within the anterior upper-outer left breast. Short term follow up was again recommended.  She bilateral diagnostic mammography with tomography at [name] on 01/03/2019 showing: breast density category C; stable probably benign calcifications at the left lumpectomy site; 1.5 cm grouped calcifications within the upper-outer quadrant of the left breast with suspicious morphology and distribution; no evidence of right breast malignancy.   Accordingly on 01/06/2019 she proceeded to biopsy of the left breast area in question. The pathology from this procedure ([id number]) showed: ductal carcinoma in situ, high grade. Prognostic indicators significant for: estrogen receptor, 100% positive and progesterone receptor, 80% positive, both with strong staining intensity.   She underwent genetic counseling on 01/16/2019, which showed negative results.  She also underwent biopsy of the calcifications at the left lumpectomy site on 01/17/2019. Pathology [id number]) showed: focal fibrocystic changes; dense fibrosis with pigmented histiocytes and foreign body giant cells.  She opted to proceed with left breast lumpectomy on 02/09/2019 under [name].  Pathology from the procedure ([id number]) showed:  1. Left Breast, lumpectomy  -  fibrocystic changes 2. Left Breast, lumpectomy  - ductal carcinoma in situ with calcifications, intermediate grade, spanning 2.4 cm  - lobular neoplasia  -negative resection margins   The patient's subsequent history is as detailed below.   PAST MEDICAL HISTORY: Past Medical History:  Diagnosis Date   Anxiety    Breast mass, left    Cancer (HCC)    Complication of anesthesia    felt lethargic x 4 days following last surgery in 2017, but 2018 did okay   Depression    Family history of premature CAD 12/29/2022   Headache    migraines   History of radiation therapy 03/13/19- 04/11/19   Left Breast 16 fraction of 2. 66 Gy each to total 42.56 Gy. Left breast boost 4 fractions of 2 Gy to total 8 Gy.    Osteopenia    Pure hypercholesterolemia 12/29/2022    PAST SURGICAL HISTORY: Past Surgical History:  Procedure Laterality Date   BREAST BIOPSY Left 07/10/2015   high risk stereo   BREAST EXCISIONAL BIOPSY Left 08/15/2015   ADH   BREAST LUMPECTOMY Left 02/09/2019   BREAST LUMPECTOMY WITH RADIOACTIVE SEED LOCALIZATION Left 08/15/2015   Procedure:  LEFT BREAST LUMPECTOMY WITH RADIOACTIVE SEED LOCALIZATION;  Surgeon: Harriette Bouillon, MD;  Location: [name];  Service: General;  Laterality: Left;   BREAST LUMPECTOMY WITH RADIOACTIVE SEED LOCALIZATION Left 02/09/2019   Procedure: LEFT BREAST LUMPECTOMY WITH RADIOACTIVE SEED LOCALIZATION;  Surgeon: Stephanie Loron, MD;  Location: [name] OR;  Service: General;  Laterality: Left;   EXCISION OF BREAST BIOPSY Left 12/30/2016   Benign ALH sclerosing lesion   RADIOACTIVE SEED GUIDED EXCISIONAL BREAST BIOPSY Left 12/30/2016   Procedure: RADIOACTIVE SEED GUIDED EXCISIONAL LEFT BREAST BIOPSY;  Surgeon: Stephanie Loron, MD;  Location: [name];  Service: General;  Laterality: Left;   RADIOACTIVE SEED GUIDED EXCISIONAL BREAST  BIOPSY Left 02/09/2019   Procedure: RADIOACTIVE SEED GUIDED EXCISIONAL LEFT  BREAST BIOPSY;  Surgeon: Stephanie Loron, MD;  Location: [facility] OR;  Service: General;  Laterality: Left;   TONSILLECTOMY  Age 69   TRIGGER FINGER RELEASE  2010   Right Thumb    FAMILY HISTORY: Family History  Problem Relation Age of Onset   Hypertension Mother    Osteoporosis Mother    Congenital heart disease Father        VSD   Depression Brother    Atrial fibrillation Brother    Coronary artery disease Brother    Coronary artery disease Brother    Atrial fibrillation Brother    Parkinson's disease Maternal Aunt    Diabetes Maternal Grandmother    Diabetes Maternal Grandfather    Diabetes Paternal Grandmother    Diabetes Paternal Grandfather    ADD / ADHD Daughter    ADD / ADHD Son   Patient's father was 74 years old when he died from complications of cardiomyopathy.  He was adopted and there is no family history on his side. Patient's mother is 68 years old as of November 2020. The patient has 3 brothers, no sisters.  She denies/or notes a family hx of breast, prostate, pancreatic or ovarian cancer.    GYNECOLOGIC HISTORY:  Patient's last menstrual period was 01/25/2014 (approximate). Menarche: 58 years old Age at first live birth: Any 58 years old GX P 2 LMP 49 Contraceptive oral contraceptives at least 7 years, with no complications HRT 9 months, discontinued because of malaise  Hysterectomy? no BSO?  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 and in labor and delivery at [employer].  She is currently doing preadmissions at the new [name]  Her husband Alinda Money works in Arts administrator..  Daughter Fleet Contras, 23, lives in [place] and works as a Programmer, systems.  Son [place], [place], with Asperger's, currently attends [employer], and lives at home with the patient.     ADVANCED DIRECTIVES: In the absence of any documents to the contrary the patient's  husband is her healthcare power of attorney   HEALTH MAINTENANCE: Social History   Tobacco Use   Smoking status: Never   Smokeless tobacco: Never  Vaping Use   Vaping status: Never Used  Substance Use Topics   Alcohol use: No    Comment: rare use   Drug use: No     Colonoscopy: not on file  PAP: 08/2017, negative  Bone density: 11/2017, -2.0   Allergies  Allergen Reactions   Wellbutrin Xl [Bupropion] Hives    Current Outpatient Medications  Medication Sig Dispense Refill   Biotin w/ Vitamins C & E (HAIR/SKIN/NAILS PO) Take 1 tablet by mouth daily.      Calcium Carb-Cholecalciferol (CALCIUM + D3 PO) Take 1 tablet by mouth daily.      CALCIUM CITRATE PO Take 1,200 mg by mouth daily.     cefadroxil (DURICEF) 500 MG capsule Take 2 capsules (1,000 mg total) by mouth 2 (two) times daily. 40 capsule 2   Evolocumab (REPATHA SURECLICK) 140 MG/ML SOAJ Inject 140 mg into the skin every 14 (fourteen) days. 2 mL 2   gabapentin (NEURONTIN) 300 MG capsule Take 1 capsule (300 mg total) by mouth at bedtime. 90 capsule 4   rosuvastatin (CRESTOR) 10 MG tablet Take 1 tablet (10 mg total) by mouth daily. 90 tablet 3   tamoxifen (NOLVADEX) 20 MG tablet Take 1 tablet (20 mg total) by mouth daily. 90 tablet 3   triamcinolone cream (KENALOG) 0.1 % Apply to affected area 2 (two) times daily. 30 g 0   venlafaxine XR (EFFEXOR-XR) 75 MG 24 hr capsule Take 1 capsule (75 mg total) by mouth daily with breakfast. 90 capsule 1   No current facility-administered medications for this visit.    OBJECTIVE: White woman who appears younger than stated age There were no vitals filed for this visit.     There is no height or weight on file to calculate BMI.   Wt Readings from Last 3 Encounters:  06/03/23 148 lb (67.1 kg)  02/04/23 148 lb (67.1 kg)  02/03/23 147 lb 12.8 oz (67 kg)     ECOG FS:1  PE deferred, telephone visit  LAB RESULTS:  CMP     Component Value Date/Time   NA 143 01/04/2023 0838   K  4.7 01/04/2023 0838   CL 102 01/04/2023 0838   CO2 24 01/04/2023 0838   GLUCOSE 88 01/04/2023 0838   GLUCOSE 145 (H) 10/15/2022 0931   BUN 13 01/04/2023 0838   CREATININE 0.70 01/04/2023 0838   CREATININE 0.75 05/29/2021 1430   CALCIUM 9.3 01/04/2023 0838   PROT 6.4 06/02/2023 0810   ALBUMIN 4.2 06/02/2023 0810   AST 30 06/02/2023 0810   AST 17 05/29/2021 1430   ALT 17 06/02/2023 0810   ALT 9 05/29/2021 1430   ALKPHOS 64 06/02/2023 0810   BILITOT 0.2 06/02/2023 0810   BILITOT 0.2 (L) 05/29/2021 1430   GFRNONAA >60 05/29/2021 1430   GFRAA >60 01/24/2020 1033    No  results found for: "TOTALPROTELP", "ALBUMINELP", "A1GS", "A2GS", "BETS", "BETA2SER", "GAMS", "MSPIKE", "SPEI"  No results found for: "KPAFRELGTCHN", "LAMBDASER", "KAPLAMBRATIO"  Lab Results  Component Value Date   WBC 5.4 10/15/2022   NEUTROABS 3.3 10/15/2022   HGB 13.0 10/15/2022   HCT 38.8 10/15/2022   MCV 90.5 10/15/2022   PLT 400.0 10/15/2022   No results found for: "LABCA2"  No components found for: "UXLKGM010"  No results for input(s): "INR" in the last 168 hours.  No results found for: "LABCA2"  No results found for: "UVO536"  No results found for: "CAN125"  No results found for: "CAN153"  No results found for: "CA2729"  No components found for: "HGQUANT"  No results found for: "CEA1", "CEA" / No results found for: "CEA1", "CEA"   No results found for: "AFPTUMOR"  No results found for: "CHROMOGRNA"  No results found for: "HGBA", "HGBA2QUANT", "HGBFQUANT", "HGBSQUAN" (Hemoglobinopathy evaluation)   No results found for: "LDH"  No results found for: "IRON", "TIBC", "IRONPCTSAT" (Iron and TIBC)  No results found for: "FERRITIN"  Urinalysis    Component Value Date/Time   LABSPEC 1.025 09/09/2009 1037   PHURINE 6.0 09/09/2009 1037   GLUCOSEU NEGATIVE 09/09/2009 1037   HGBUR TRACE (A) 09/09/2009 1037   BILIRUBINUR NEGATIVE 09/09/2009 1037   KETONESUR NEGATIVE 09/09/2009 1037    PROTEINUR NEGATIVE 09/09/2009 1037   UROBILINOGEN 0.2 09/09/2009 1037   NITRITE NEGATIVE 09/09/2009 1037   LEUKOCYTESUR  09/09/2009 1037    NEGATIVE Biochemical Testing Only. Please order routine urinalysis from main lab if confirmatory testing is needed.    STUDIES: No results found.    ELIGIBLE FOR AVAILABLE RESEARCH PROTOCOL: no  ASSESSMENT: 58 y.o. [place] woman status post left breast biopsy 01/06/2019 ductal carcinoma in situ, grade 3, estrogen and progesterone receptor positive  (a) biopsy of a second area of calcifications in the left breast on 01/17/2019 -  (1) status post left lumpectomy 02/09/2019 2.4 cm ductal carcinoma in situ, grade 2, with negative margins  (2) adjuvant radiation 03/13/2019 - 04/11/2019  (a) left breast / 42.56 Gy in 16 fractions  (b) boost / 8 Gy in 4 fractions  (3) tamoxifen started 04/28/2019  (4) genetics testing 01/19/2019 through the [facility] Breast Cancer STAT Panel + Common Hereditary Cancers Panel found no deleterious mutations in ATM, BRCA1, BRCA2, CDH1, CHEK2, PALB2, PTEN, STK11 and TP53 // APC, ATM, AXIN2, BARD1, BMPR1A, BRCA1, BRCA2, BRIP1, CDH1, CDKN2A (p14ARF), CDKN2A (p16INK4a), CKD4, CHEK2, CTNNA1, DICER1, EPCAM (Deletion/duplication testing only), GREM1 (promoter region deletion/duplication testing only), KIT, MEN1, MLH1, MSH2, MSH3, MSH6, MUTYH, NBN, NF1, NHTL1, PALB2, PDGFRA, PMS2, POLD1, POLE, PTEN, RAD50, RAD51C, RAD51D, SDHB, SDHC, SDHD, SMAD4, SMARCA4. STK11, TP53, TSC1, TSC2, and VHL.  The following genes were evaluated for sequence changes only: SDHA and HOXB13 c.251G>A variant only.  (5) left breast cellulitis October 2022, requiring intravenous vancomycin, completed November 2022.  PLAN:  This is a pleasant 58 year old female patient with a left breast DCIS ER/PR positive status post left lumpectomy and adjuvant radiation in 2020, adjuvant tamoxifen started on April 28, 2019, currently compliant with tamoxifen. We have today  discussed over the phone that its ideally recommended to continue anti estrogen therapy for 5 yrs. If she chooses to discontinue earlier, there is no good way to tease out the benefit she may have lost but not taking it the prescribed amount We discussed about other anti estrogen therapies, she is not interested. After much discussion, she would like to contiinue taking it. She understands increased risk  of DVT/PE with it, advised hydration, compression socks and aspirin if she is planning a long trip ( she is going to [place] in May)  Total time spent: 10 min  I connected with  Stephanie [name] on 06/28/23 by a telephone application and verified that I am speaking with the correct person using two identifiers.   I discussed the limitations of evaluation and management by telemedicine. The patient expressed understanding and agreed to proceed.  Location of provider: Office Location of patient: Home.  Time spent: 10 min  *Total Encounter Time as defined by the Centers for Medicare and Medicaid Services includes, in addition to the face-to-face time of a patient visit (documented in the note above) non-face-to-face time: obtaining and reviewing outside history, ordering and reviewing medications, tests or procedures, care coordination (communications with other health care professionals or caregivers) and documentation in the medical record.

## 2023-07-01 ENCOUNTER — Other Ambulatory Visit (HOSPITAL_BASED_OUTPATIENT_CLINIC_OR_DEPARTMENT_OTHER): Payer: Self-pay

## 2023-07-01 ENCOUNTER — Telehealth: Payer: Self-pay | Admitting: Infectious Diseases

## 2023-07-01 MED ORDER — MUPIROCIN 2 % EX OINT
1.0000 | TOPICAL_OINTMENT | Freq: Two times a day (BID) | CUTANEOUS | 0 refills | Status: AC
  Filled 2023-07-01: qty 22, 30d supply, fill #0

## 2023-07-01 MED ORDER — CEFADROXIL 500 MG PO CAPS
1000.0000 mg | ORAL_CAPSULE | Freq: Two times a day (BID) | ORAL | 0 refills | Status: AC
Start: 2023-07-03 — End: 2023-07-10
  Filled 2023-07-01 – 2023-07-02 (×2): qty 20, 5d supply, fill #0

## 2023-07-01 NOTE — Telephone Encounter (Signed)
 [name] let me know about a study looking at micro abrasions on breasts in women with recurrent cellulitis of the breast that used topical antibiotic cream / ointment - Started putting neosporin on nipple and aereola and redness went away quickly. She feels her energy level is 80% now. She is due to complete her cefadroxil on Saturday to complete 10 days. She is still quite painful under the arm which is part of her typical prodrome.   She is concerned she may need dalvance to clear this cellulitis - we talked about a plan to extend out cefadroxil another 5 days if Saturday she is not 100 % - I think that may be helpful to get her to avoid dalvance. We can arrange that next week if needed.    Rexene Alberts, MSN, NP-C [facility]  [email or web address] Pager: [phone] Office: [phone] [facility] Main Line: [phone] *Secure Chat Communication Welcome

## 2023-07-02 ENCOUNTER — Other Ambulatory Visit (HOSPITAL_BASED_OUTPATIENT_CLINIC_OR_DEPARTMENT_OTHER): Payer: Self-pay

## 2023-07-05 ENCOUNTER — Other Ambulatory Visit (HOSPITAL_BASED_OUTPATIENT_CLINIC_OR_DEPARTMENT_OTHER): Payer: Self-pay | Admitting: Nurse Practitioner

## 2023-07-05 ENCOUNTER — Other Ambulatory Visit (HOSPITAL_BASED_OUTPATIENT_CLINIC_OR_DEPARTMENT_OTHER): Payer: Self-pay

## 2023-07-05 DIAGNOSIS — E7841 Elevated Lipoprotein(a): Secondary | ICD-10-CM

## 2023-07-05 DIAGNOSIS — E7800 Pure hypercholesterolemia, unspecified: Secondary | ICD-10-CM

## 2023-07-05 DIAGNOSIS — Z8249 Family history of ischemic heart disease and other diseases of the circulatory system: Secondary | ICD-10-CM

## 2023-07-05 DIAGNOSIS — E785 Hyperlipidemia, unspecified: Secondary | ICD-10-CM

## 2023-07-07 ENCOUNTER — Other Ambulatory Visit: Payer: Self-pay

## 2023-07-07 ENCOUNTER — Other Ambulatory Visit (HOSPITAL_BASED_OUTPATIENT_CLINIC_OR_DEPARTMENT_OTHER): Payer: Self-pay

## 2023-07-07 MED ORDER — REPATHA SURECLICK 140 MG/ML ~~LOC~~ SOAJ
140.0000 mg | SUBCUTANEOUS | 2 refills | Status: AC
  Filled 2023-07-07: qty 6, 84d supply, fill #0
  Filled 2024-01-25: qty 6, 84d supply, fill #1
  Filled 2024-02-10 – 2024-05-11 (×2): qty 6, 84d supply, fill #2

## 2023-08-12 ENCOUNTER — Other Ambulatory Visit (HOSPITAL_BASED_OUTPATIENT_CLINIC_OR_DEPARTMENT_OTHER): Payer: Self-pay

## 2023-08-12 ENCOUNTER — Encounter: Payer: Self-pay | Admitting: Hematology and Oncology

## 2023-08-20 ENCOUNTER — Other Ambulatory Visit (HOSPITAL_BASED_OUTPATIENT_CLINIC_OR_DEPARTMENT_OTHER): Payer: Self-pay

## 2023-08-25 ENCOUNTER — Other Ambulatory Visit (HOSPITAL_BASED_OUTPATIENT_CLINIC_OR_DEPARTMENT_OTHER): Payer: Self-pay

## 2023-08-25 ENCOUNTER — Other Ambulatory Visit: Payer: Self-pay

## 2023-08-25 ENCOUNTER — Telehealth (INDEPENDENT_AMBULATORY_CARE_PROVIDER_SITE_OTHER): Admitting: Infectious Diseases

## 2023-08-25 DIAGNOSIS — N61 Mastitis without abscess: Secondary | ICD-10-CM

## 2023-08-25 MED ORDER — CEFADROXIL 500 MG PO CAPS
1000.0000 mg | ORAL_CAPSULE | Freq: Two times a day (BID) | ORAL | 0 refills | Status: DC
  Filled 2023-08-25: qty 80, 20d supply, fill #0

## 2023-08-25 NOTE — Progress Notes (Signed)
 Subjective:  Patient ID: Stephanie [name], female    DOB: [birth year]  Age: 58 y.o. MRN: [id number]  VIRTUAL CARE ENCOUNTER  I connected with Stephanie [name] on 08/26/23 at  3:45 PM EDT by VIDEO and verified that I am speaking with the correct person using two identifiers.   I discussed the limitations, risks, security and privacy concerns of performing an evaluation and management service by telephone and the availability of in person appointments. I also discussed with the patient that there may be a patient responsible charge related to this service. The patient expressed understanding and agreed to proceed.  Patient Location: [place] residence   Other Participants:   Provider Location: [facility] Office    CC:  FU Recurrent breast cellulitis   HPI  History of Present Illness   Stephanie NEELD "[name]" is a 58 year old female with recurrent cellulitis of left breast who presents for management of frequent flares.   She has noticed that this flare was not as familiar presentation as in the past. Feels that the infection is stemming from scaring and fragile breast tissue around the areola.  She experiences several flares annually and manages them with topical treatments such as Bactroban  and triple antibiotic ointment. She tolerates these treatments well without allergic reactions.  Cefadroxil  has been effective. She wants to discuss a plan for her upcoming travel to [place] to ensure she has what she needs and if anything else can be done to help with preventing episodes. She stopped shaving under the arms to help with this.        Past Medical History:  Diagnosis Date   Anxiety    Breast mass, left    Cancer (HCC)    Complication of anesthesia    felt lethargic x 4 days following last surgery in 2017, but 2018 did okay   Depression    Family history of premature CAD 12/29/2022   Headache    migraines   History of radiation therapy 03/13/19- 04/11/19   Left Breast 16 fraction of 2.  66 Gy each to total 42.56 Gy. Left breast boost 4 fractions of 2 Gy to total 8 Gy.    Osteopenia    Pure hypercholesterolemia 12/29/2022    Past Surgical History:  Procedure Laterality Date   BREAST BIOPSY Left 07/10/2015   high risk stereo   BREAST EXCISIONAL BIOPSY Left 08/15/2015   ADH   BREAST LUMPECTOMY Left 02/09/2019   BREAST LUMPECTOMY WITH RADIOACTIVE SEED LOCALIZATION Left 08/15/2015   Procedure:  LEFT BREAST LUMPECTOMY WITH RADIOACTIVE SEED LOCALIZATION;  Surgeon: Sim Dryer, MD;  Location: [facility];  Service: General;  Laterality: Left;   BREAST LUMPECTOMY WITH RADIOACTIVE SEED LOCALIZATION Left 02/09/2019   Procedure: LEFT BREAST LUMPECTOMY WITH RADIOACTIVE SEED LOCALIZATION;  Surgeon: Enid Harry, MD;  Location: [facility];  Service: General;  Laterality: Left;   EXCISION OF BREAST BIOPSY Left 12/30/2016   Benign ALH sclerosing lesion   RADIOACTIVE SEED GUIDED EXCISIONAL BREAST BIOPSY Left 12/30/2016   Procedure: RADIOACTIVE SEED GUIDED EXCISIONAL LEFT BREAST BIOPSY;  Surgeon: Enid Harry, MD;  Location: [facility];  Service: General;  Laterality: Left;   RADIOACTIVE SEED GUIDED EXCISIONAL BREAST BIOPSY Left 02/09/2019   Procedure: RADIOACTIVE SEED GUIDED EXCISIONAL LEFT  BREAST BIOPSY;  Surgeon: Enid Harry, MD;  Location: [facility] OR;  Service: General;  Laterality: Left;   TONSILLECTOMY  Age 71   TRIGGER FINGER RELEASE  2010   Right Thumb  Social History   Socioeconomic History   Marital status: Married    Spouse name: Not on file   Number of children: Not on file   Years of education: Not on file   Highest education level: Not on file  Occupational History   Occupation: RN    Employer: [employer]  Tobacco Use   Smoking status: Never   Smokeless tobacco: Never  Vaping Use   Vaping status: Never Used  Substance and Sexual Activity   Alcohol use: No    Comment: rare use   Drug use: No   Sexual activity:  Yes    Partners: Male    Birth control/protection: Post-menopausal  Other Topics Concern   Not on file  Social History Narrative   04/05/12 AM he was born and grew up in [place] . She has 3 older brothers. She never suffered any abuse. Her father died when she was 26 years of age. She completed her bachelor of science in nursing in 1989, and has been working as an Charity fundraiser . She has been married for 21 years, and she and her husband have a 17 year old son and a 81 year old daughter. She affiliates as Control and instrumentation engineer. She denies any legal involvement. She enjoys hiking and reading. She reports her social support consists of friends from college, and a friend from church. 04/05/2012   Social Drivers of Corporate investment banker Strain: Not on file  Food Insecurity: Not on file  Transportation Needs: No Transportation Needs (03/03/2019)   PRAPARE - Administrator, Civil Service (Medical): No    Lack of Transportation (Non-Medical): No  Physical Activity: Not on file  Stress: Not on file  Social Connections: Not on file  Intimate Partner Violence: Not At Risk (03/03/2019)   Humiliation, Afraid, Rape, and Kick questionnaire    Fear of Current or Ex-Partner: No    Emotionally Abused: No    Physically Abused: No    Sexually Abused: No    Outpatient Medications Prior to Visit  Medication Sig Dispense Refill   Biotin w/ Vitamins C & E (HAIR/SKIN/NAILS PO) Take 1 tablet by mouth daily.      Calcium  Carb-Cholecalciferol  (CALCIUM  + D3 PO) Take 1 tablet by mouth daily.      CALCIUM  CITRATE PO Take 1,200 mg by mouth daily.     Evolocumab  (REPATHA  SURECLICK) 140 MG/ML SOAJ Inject 140 mg into the skin every 14 (fourteen) days. 6 mL 2   gabapentin  (NEURONTIN ) 300 MG capsule Take 1 capsule (300 mg total) by mouth at bedtime. 90 capsule 4   mupirocin  ointment (BACTROBAN ) 2 % Apply 1 Application topically 2 (two) times daily. 22 g 0   rosuvastatin  (CRESTOR ) 10 MG tablet Take 1 tablet (10  mg total) by mouth daily. 90 tablet 3   tamoxifen  (NOLVADEX ) 20 MG tablet Take 1 tablet (20 mg total) by mouth daily. 90 tablet 3   triamcinolone  cream (KENALOG ) 0.1 % Apply to affected area 2 (two) times daily. 30 g 0   venlafaxine  XR (EFFEXOR -XR) 75 MG 24 hr capsule Take 1 capsule (75 mg total) by mouth daily with breakfast. 90 capsule 1   cefadroxil  (DURICEF) 500 MG capsule Take 2 capsules (1,000 mg total) by mouth 2 (two) times daily for 14 days. Start at onset of cellulitis symptoms (Patient not taking: Reported on 10/21/2022) 56 capsule 3   No facility-administered medications prior to visit.    Allergies  Allergen Reactions   Wellbutrin  Xl [Bupropion ] Hives  ROS Review of Systems  Constitutional:  Negative for fever. Chills: shaking. Skin:  Positive for color change. Negative for wound.      Objective:    Physical Exam   LMP 01/25/2014 (Approximate)  Wt Readings from Last 3 Encounters:  06/03/23 148 lb (67.1 kg)  02/04/23 148 lb (67.1 kg)  02/03/23 147 lb 12.8 oz (67 kg)     Health Maintenance Due  Topic Date Due   COVID-19 Vaccine (1) Never done   HIV Screening  Never done   Hepatitis C Screening  Never done   DTaP/Tdap/Td (1 - Tdap) Never done   Zoster Vaccines- Shingrix (1 of 2) Never done   Colonoscopy  Never done   Cervical Cancer Screening (HPV/Pap Cotest)  09/07/2022    There are no preventive care reminders to display for this patient.  Lab Results  Component Value Date   TSH 2.880 10/24/2019      Assessment & Plan:     Recurrent cellulitis of breast - Recurrent cellulitis likely due to compromised skin integrity from prior radiation and surgery/scar tissue which is a risk factor we cannot modify. I think given her propensity for flares, we can try to enlist a daily suppressive dose of oral cefadroxil  with the topical ointment over the scars and fragile tissue. Not clear if it is more the emollient that helps with skin integrity or the  antibacterial component.  - Initiate cefadroxil  500 mg twice daily as suppressive therapy. - Provide 80 capsules of cefadroxil  for suppressive option and to treat potential flare while [place] the [place]. - Continue use of Bactroban  or triple antibiotic ointment as tolerated. - Consider use of Aquaphor or sensitive skin lotions (Aveeno, Eucerin) to maintain skin integrity. - Reassess antibiotic regimen after 1-2 months. - Ensure proper fitting of bras to avoid excessive tightness and potential skin trauma.  Recording duration: 23 minutes     Total Encounter Time: 30 minutes    Gibson Kurtz, MSN, NP-C [facility]  [email or web address] Pager: [phone] Office: [phone] [facility] Main Line: [phone] *Secure Chat Communication Welcome     Discussed the use of AI scribe software for clinical note transcription with the patient, who gave verbal consent to proceed.

## 2023-08-26 ENCOUNTER — Encounter: Payer: Self-pay | Admitting: Infectious Diseases

## 2023-09-17 ENCOUNTER — Other Ambulatory Visit (HOSPITAL_BASED_OUTPATIENT_CLINIC_OR_DEPARTMENT_OTHER): Payer: Self-pay

## 2023-09-23 ENCOUNTER — Encounter (INDEPENDENT_AMBULATORY_CARE_PROVIDER_SITE_OTHER): Payer: Self-pay

## 2023-09-23 ENCOUNTER — Telehealth: Payer: Self-pay

## 2023-09-23 ENCOUNTER — Other Ambulatory Visit: Payer: Self-pay | Admitting: Infectious Diseases

## 2023-09-23 DIAGNOSIS — N61 Mastitis without abscess: Secondary | ICD-10-CM | POA: Diagnosis not present

## 2023-09-23 DIAGNOSIS — L589 Radiodermatitis, unspecified: Secondary | ICD-10-CM

## 2023-09-23 NOTE — Telephone Encounter (Signed)
 Auth Submission: NO AUTH NEEDED Site of care: Site of care: [facility] Payer: [employer] commercial Medication & CPT/J Code(s) submitted: Dalvance  (Dalbavancin) [id number] Route of submission (phone, fax, portal): portal Phone # Fax # Auth type: Buy/Bill PB Units/visits requested: 1500mg  x 1 dose Reference number:  Approval from: 09/23/23 to 12/24/23

## 2023-09-24 ENCOUNTER — Ambulatory Visit (INDEPENDENT_AMBULATORY_CARE_PROVIDER_SITE_OTHER)

## 2023-09-24 VITALS — BP 123/82 | HR 74 | Temp 97.8°F | Resp 14 | Ht 63.0 in | Wt 147.8 lb

## 2023-09-24 DIAGNOSIS — N61 Mastitis without abscess: Secondary | ICD-10-CM

## 2023-09-24 MED ORDER — IBUPROFEN 200 MG PO TABS
600.0000 mg | ORAL_TABLET | Freq: Once | ORAL | Status: AC
  Administered 2023-09-24: 600 mg via ORAL
  Filled 2023-09-24: qty 3

## 2023-09-24 MED ORDER — DEXTROSE 5 % IV SOLN
1500.0000 mg | Freq: Once | INTRAVENOUS | Status: AC
  Administered 2023-09-24: 1500 mg via INTRAVENOUS
  Filled 2023-09-24: qty 75

## 2023-09-24 NOTE — Progress Notes (Signed)
 Diagnosis: Cellulitis of left breast  Provider:  Mannam, Praveen MD  Procedure: IV Infusion  IV Type: Peripheral, IV Location: L Antecubital  Dalvance  (Dalbavancin), Dose: 1500 mg  Infusion Start Time: 1134  Infusion Stop Time: 1153  Infusion Start Time: 1207  Infusion Stop Time: 1247  At 1153, patient c/o severe 7-8/10 pain, described as "muscle spasms" that began in her lower back and extended up into her neck. Infusion paused. VSS. Secure message sent to ordering provider, Gibson Kurtz, NP and [facility] pharmacist Nicklas Barns at [phone]. Also spoke with clinic pharmacist Teldrin James. Ordering provider responded at 1156. Per Gibson Kurtz, NP, ok to proceed with infusion at a reduced rate (one half the previous rate) once symptoms resolve. Provider also ordered ibuprofen  600 mg PO for patient to take after infusion was restarted, to ensure she would tolerate the reduced rate. Infusion restarted at 480mL/hour at 1207 after patient stated that symptoms had completely resolved. Patient observed for 15 minutes; stated no symptoms. VSS. Ibuprofen  administered at 1239 after infusion had run at reduced rate and was well tolerated by patient.  Post Infusion IV Care: Patient declined observation and Peripheral IV Discontinued  Discharge: Condition: Stable, Destination: Home . AVS Declined  Performed by:  Lendel Quant, RN

## 2023-09-28 ENCOUNTER — Other Ambulatory Visit (HOSPITAL_BASED_OUTPATIENT_CLINIC_OR_DEPARTMENT_OTHER): Payer: Self-pay

## 2023-09-28 MED ORDER — LINEZOLID 600 MG PO TABS
600.0000 mg | ORAL_TABLET | Freq: Two times a day (BID) | ORAL | 2 refills | Status: AC
  Filled 2023-09-28 – 2023-10-21 (×2): qty 20, 10d supply, fill #0

## 2023-09-28 NOTE — Addendum Note (Signed)
 Addended by: Orson Blalock on: 09/28/2023 10:10 AM   Modules accepted: Orders

## 2023-09-28 NOTE — Telephone Encounter (Signed)
 Please see the MyChart message reply(ies) for my assessment and plan.    This patient gave consent for this Medical Advice Message and is aware that it may result in a bill to [employer], as well as the possibility of receiving a bill for a co-payment or deductible. They are an established patient, but are not seeking medical advice exclusively about a problem treated during an in person or video visit in the last seven days. I did not recommend an in person or video visit within seven days of my reply.    I spent a total of 48 minutes cumulative time within 7 days through [employer].  Gibson Kurtz, NP

## 2023-09-28 NOTE — Addendum Note (Signed)
 Addended by: Orson Blalock on: 09/28/2023 11:45 AM   Modules accepted: Orders

## 2023-10-06 ENCOUNTER — Encounter: Payer: Self-pay | Admitting: Dermatology

## 2023-10-06 ENCOUNTER — Other Ambulatory Visit (HOSPITAL_BASED_OUTPATIENT_CLINIC_OR_DEPARTMENT_OTHER): Payer: Self-pay

## 2023-10-06 ENCOUNTER — Ambulatory Visit: Admitting: Dermatology

## 2023-10-06 VITALS — BP 108/79 | HR 92

## 2023-10-06 DIAGNOSIS — R21 Rash and other nonspecific skin eruption: Secondary | ICD-10-CM

## 2023-10-06 DIAGNOSIS — L905 Scar conditions and fibrosis of skin: Secondary | ICD-10-CM | POA: Diagnosis not present

## 2023-10-06 DIAGNOSIS — N61 Mastitis without abscess: Secondary | ICD-10-CM

## 2023-10-06 MED ORDER — MUPIROCIN 2 % EX OINT
1.0000 | TOPICAL_OINTMENT | Freq: Every day | CUTANEOUS | 5 refills | Status: AC
  Filled 2023-10-06: qty 88, 90d supply, fill #0
  Filled 2023-10-21: qty 88, 88d supply, fill #0

## 2023-10-06 NOTE — Progress Notes (Signed)
   New Patient Visit   Subjective  Stephanie [name] is a 58 y.o. female who presents for the following: referral for recurrent rash of left breast. She has a history of radiation and multiple surgeries to the breast for breast cancer. The rash occurs every few months and consists of pain, fevers, redness, swelling, warmth of the left breast. She has been previously treated with oral steroids, with no improvement, and has had several rounds of antibiotics, including bactrim  (did not work), 1st generation cephalosporin, linezolid , dalbavancin, and topical mupirocin .  History of left breast lumpectomy on 08/15/2015 for fibrocystic changes with calcifications ([id number]). Second lumpectomy on 12/30/2016 for lobular neoplasia, complex sclerosing lesion, fibrocystic change, and sclerosing adenosis with calcifications ([id number]). Screening mammogram on 12/14/2017 showed possible left breast abnormality; diagnostic mammogram on 01/27/2018 revealed probably benign calcifications with short-term follow-up recommended. Follow-up diagnostic mammogram on 09/29/2018 showed stable calcifications and again recommended short-term follow-up. Bilateral diagnostic mammography with tomography on 01/03/2019 showed suspicious calcifications in the left upper-outer quadrant. 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 Genetic counseling on 01/16/2019 was negative. Biopsy of lumpectomy site calcifications on 01/17/2019 showed focal fibrocystic changes ([id number]). 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  The following portions of the chart were reviewed this encounter and updated as appropriate: medications, allergies, medical history  Review of Systems:  No other skin or systemic complaints except as noted in HPI or Assessment and Plan.  Objective  Well appearing  patient in no apparent distress; mood and affect are within normal limits.   A focused examination was performed of the following areas: Left breast  Relevant exam findings are noted in the Assessment and Plan.    Assessment & Plan   Scarring around left areola - Monitor for changes.   DDx-recurrent mastitis secondary to scarring and occlusion of lymphatics as well as glandular drainage versus radiation recall dermatitis Reviewed patient's previous notes, as well as patient photos provided in the office today showing the rash when it is flared.  Rash is not present on exam today.  Based on the patient's history, photos, and previous medical notes, highest clinical suspicion for recurrent mastitis secondary to scarring from previous procedures, and occlusion of lymphatics.  Discussed considering de-colonizing skin by using chlorhexidine  wash daily in the shower, and mupirocin  daily around the scar tissue close to the areola daily.  Discussed that radiation recall may be on the differential, and does show suspicion based on the area radiated, and where the rash appears.  However, presence of fevers and swelling of the area not consistent with this diagnosis.  Additionally, lack of response to steroids make this diagnosis less likely. Recommend that patient call if the rash re-appears and we will work her in for potential biopsy and clinical evaluation.   Patient here for recurrent cellulitis. We recommend using mupirocin  on the nipple and scar area daily.  I also recommend using Hibiclens  daily to help keep bacteria count down.  Patient is to call with any new flairs.  Return for call with flair.  I, Haig Levan, Surg Tech III, am acting as scribe for Deneise Finlay, MD.   Documentation: I have reviewed the above documentation for accuracy and completeness, and I agree with the above.  Deneise Finlay, MD

## 2023-10-08 ENCOUNTER — Other Ambulatory Visit (HOSPITAL_BASED_OUTPATIENT_CLINIC_OR_DEPARTMENT_OTHER): Payer: Self-pay

## 2023-10-16 ENCOUNTER — Other Ambulatory Visit (HOSPITAL_BASED_OUTPATIENT_CLINIC_OR_DEPARTMENT_OTHER): Payer: Self-pay

## 2023-10-21 ENCOUNTER — Other Ambulatory Visit (HOSPITAL_BASED_OUTPATIENT_CLINIC_OR_DEPARTMENT_OTHER): Payer: Self-pay

## 2023-11-04 ENCOUNTER — Encounter: Payer: Self-pay | Admitting: Hematology and Oncology

## 2023-11-15 ENCOUNTER — Other Ambulatory Visit: Payer: Self-pay | Admitting: *Deleted

## 2023-11-15 ENCOUNTER — Encounter: Payer: Self-pay | Admitting: Hematology and Oncology

## 2023-11-15 DIAGNOSIS — Z170 Estrogen receptor positive status [ER+]: Secondary | ICD-10-CM

## 2023-11-15 DIAGNOSIS — S8264XD Nondisplaced fracture of lateral malleolus of right fibula, subsequent encounter for closed fracture with routine healing: Secondary | ICD-10-CM

## 2023-11-15 DIAGNOSIS — M810 Age-related osteoporosis without current pathological fracture: Secondary | ICD-10-CM

## 2023-11-16 ENCOUNTER — Other Ambulatory Visit (HOSPITAL_BASED_OUTPATIENT_CLINIC_OR_DEPARTMENT_OTHER): Payer: Self-pay

## 2023-11-16 ENCOUNTER — Other Ambulatory Visit: Payer: Self-pay | Admitting: Adult Health

## 2023-11-17 ENCOUNTER — Other Ambulatory Visit (HOSPITAL_BASED_OUTPATIENT_CLINIC_OR_DEPARTMENT_OTHER): Payer: Self-pay

## 2023-11-17 MED ORDER — GABAPENTIN 300 MG PO CAPS
300.0000 mg | ORAL_CAPSULE | Freq: Every day | ORAL | 4 refills | Status: AC
  Filled 2023-11-17: qty 90, 90d supply, fill #0
  Filled 2024-02-10: qty 90, 90d supply, fill #1

## 2023-11-17 MED ORDER — VENLAFAXINE HCL ER 75 MG PO CP24
75.0000 mg | ORAL_CAPSULE | Freq: Every day | ORAL | 1 refills | Status: DC
  Filled 2023-11-17: qty 90, 90d supply, fill #0
  Filled 2024-02-10: qty 90, 90d supply, fill #1

## 2023-12-02 ENCOUNTER — Ambulatory Visit (HOSPITAL_BASED_OUTPATIENT_CLINIC_OR_DEPARTMENT_OTHER): Admitting: Nurse Practitioner

## 2023-12-02 VITALS — BP 126/72 | HR 72 | Ht 63.0 in | Wt 146.1 lb

## 2023-12-02 DIAGNOSIS — E785 Hyperlipidemia, unspecified: Secondary | ICD-10-CM

## 2023-12-02 DIAGNOSIS — Z8249 Family history of ischemic heart disease and other diseases of the circulatory system: Secondary | ICD-10-CM | POA: Diagnosis not present

## 2023-12-02 DIAGNOSIS — Z789 Other specified health status: Secondary | ICD-10-CM

## 2023-12-02 DIAGNOSIS — E781 Pure hyperglyceridemia: Secondary | ICD-10-CM | POA: Diagnosis not present

## 2023-12-02 DIAGNOSIS — E7841 Elevated Lipoprotein(a): Secondary | ICD-10-CM

## 2023-12-02 DIAGNOSIS — D0512 Intraductal carcinoma in situ of left breast: Secondary | ICD-10-CM | POA: Diagnosis not present

## 2023-12-02 NOTE — Progress Notes (Unsigned)
 Cardiology Office Note:  .   Date:  12/03/2023  ID:  Raguel LITTIE River, DOB 11/16/1965, MRN [id number] PCP: Melvenia Corean SAILOR, NP  [facility] Providers Cardiologist:  None    Patient Profile: .      PMH Family history CAD Breast cancer S/p XRT Dyslipidemia Coronary calcium  score of 0 Elevated lipoprotein a Anxiety  Referred to cardiology and seen by Dr. Raford 12/29/2022 for family history of CAD.  She has one brother who was 10-56 y/o when he developed chest tightness and subsequently underwent heart cath revealing 80% blockage with placement of 2 stents.  Afterwards he had aphasia and was found to have carotid bruit.  He underwent endarterectomy for carotid stenosis.  Oldest brother was 19 when he underwent CABG x 4 in 02/2022.  Brother is otherwise healthy and in good shape.  She has a third brother who has no known history of heart disease.  Her father died at 61 years old assumed due to VSD.  She had gestational hypertension during first pregnancy but not diagnosed with preeclampsia.  History of elevated cholesterol.  Lipid panel in 09/2019 with triglycerides 235, HDL 66, and LDL 144.  She reports healthy diet and working with a nutrition program with [employer]. Lunch is vegetables, salads, 25% protein and 25% high density carbs.  Dinner is 25% protein and 75% vegetables.  Not as much formal exercise in the previous few weeks.  Previously active with yoga, walking, and running with no anginal symptoms. No smoking history. She underwent coronary calcium  score for further risk stratification.  CT calcium  score of 0 on 01/21/2023.  Elevated LP(a) of 230.8.  She was advised to start rosuvastatin  10 mg daily.  Seen by me on 02/03/2023 for follow-up. Was feeling well until the morning of the visit when she started having symptoms of vertigo. Experienced vertigo in the past, but it has been several years since the last episode. Picked up a prescription for rosuvastatin  but has not started it yet  due to the onset of vertigo. Aware that rosuvastatin  is best taken with the heaviest meal of the day.  CT calcium  score came was zero; discussed risk of future CV event. We discussed PCSK9i therapy given elevated LP(a). Strong family history of heart disease, with her father dying at 69 and two brothers recently diagnosed with heart disease. One brother had stents placed after experiencing minor discomfort while running, and the other had a quadruple bypass after a significant blockage was found at a branch of three arteries. She is the youngest sibling and is currently trying to lose weight through diet and exercise. No chest pain, shortness of breath, orthopnea, PND, presyncope, syncope, or palpitations.   Repatha  was started 01/2023. She reported itching after initial dose but was able to resume the medication with no acute concerns.  Follow-up lipid panel 06/02/2023 revealed total cholesterol 110, triglycerides 169, HDL 45, LDL-C 35.        History of Present Illness: .    Discussed the use of AI scribe software for clinical note transcription with the patient, who gave verbal consent to proceed.  History of Present Illness FATIMATA TALSMA [name] is a very pleasant 58 year old female who is here today for follow-up of dyslipidemia. Admits she struggles with remembering to take her Repatha  injections every two weeks, often extending to three or four weeks between doses. She consistently takes Crestor  daily. She engages in physical activity, walking about four and a half miles a couple  of days a week and incorporating interval jogging. She aims to lose weight and is currently around 144-145 pounds. Her weight fluctuates slightly, and she aims to reach a BMI under the overweight category. She experiences occasional leg swelling, which she associates with soda consumption and possibly tamoxifen  use. The swelling resolves after a week or two without recurrence. She has a family history of high triglycerides.  She denies chest pain, shortness of breath,fatigue, palpitations,  presyncope, syncope, orthopnea, and PND.   ROS: See HPI       Studies Reviewed: SABRA   EKG Interpretation Date/Time:  Thursday December 02 2023 15:50:29 EDT Ventricular Rate:  79 PR Interval:  134 QRS Duration:  74 QT Interval:  386 QTC Calculation: 442 R Axis:   73  Text Interpretation: Normal sinus rhythm Normal ECG When compared with ECG of 29-Dec-2022 13:44, No significant change was found Confirmed by Percy Browning [phone]) on 12/02/2023 3:59:12 PM     Risk Assessment/Calculations:             Physical Exam:   VS:  BP 126/72   Pulse 72   Ht 5' 3 (1.6 m)   Wt 146 lb 1.6 oz (66.3 kg)   LMP 01/25/2014 (Approximate)   SpO2 99%   BMI 25.88 kg/m    Wt Readings from Last 3 Encounters:  12/02/23 146 lb 1.6 oz (66.3 kg)  09/24/23 147 lb 12.8 oz (67 kg)  06/03/23 148 lb (67.1 kg)    GEN: Well nourished, well developed in no acute distress NECK: No JVD; No carotid bruits CARDIAC: RRR, no murmurs, rubs, gallops RESPIRATORY:  Clear to auscultation without rales, wheezing or rhonchi  ABDOMEN: Soft, non-tender, non-distended EXTREMITIES:  No edema; No deformity     ASSESSMENT AND PLAN: .    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 She is now on Repatha  and does not have any concerning side effects but is struggling to be consistent with every 14 day dosing. She plans to use reminders and notifications to help with compliance. We will await consistency and recheck lipids after 4 appropriated timed injections.   Dyslipidemia LDL goal < 70: Lipid panel 06/02/2023 with total cholesterol 110, triglycerides 169, HDL 45, and LDL-C 37. Calcium  score is zero. She is tolerating Repatha  and rosuvastatin  without concerning side effects. Admits noncompliance with every 14 days dosing of Repatha .  She is working on reminders to become more consistent.  Plan to retest cholesterol after 4 injections unless  she experiences additional difficulty with noncompliance.  Weight management/hypertriglyceridemia: She is actively trying to lose weight with a goal of BMI < 25.  We discussed tracking food intake with a diary or an app like my fitness pal and focusing on high-protein and fiber intake.  She has a family history of hypertriglyceridemia.  Advised that she especially avoid simple carbohydrates and sugar to manage triglycerides.  Consider incorporating weightlifting and resistance training into exercise routine.  Breast cancer: She continues to take tamoxifen  which she feels contributes to occasional leg swelling.  She did have radiation of her left breast.  Encouraged her to consider baseline echocardiogram.  She plans to pursue this when she is able to cover cost with flex spending account.        Dispo: 1 year with Dr. Raford or APP  Signed, Browning Percy, NP-C

## 2023-12-02 NOTE — Patient Instructions (Signed)
 Medication Instructions:   Your physician recommends that you continue on your current medications as directed. Please refer to the Current Medication list given to you today.   *If you need a refill on your cardiac medications before your next appointment, please call your pharmacy*  Lab Work:  Your physician recommends that you return for a FASTING /NMR/ALT, fasting after midnight after 4 injections of Repatha .    If you have labs (blood work) drawn today and your tests are completely normal, you will receive your results only by: MyChart Message (if you have MyChart) OR A paper copy in the mail If you have any lab test that is abnormal or we need to change your treatment, we will call you to review the results.  Testing/Procedures:  None ordered.  Follow-Up: At [facility], you and your health needs are our priority.  As part of our continuing mission to provide you with exceptional heart care, our providers are all part of one team.  This team includes your primary Cardiologist (physician) and Advanced Practice Providers or APPs (Physician Assistants and Nurse Practitioners) who all work together to provide you with the care you need, when you need it.  Your next appointment:   1 year(s)  Provider:   Annabella Scarce, MD, Rosaline Bane, NP, or Reche Finder, NP    We recommend signing up for the patient portal called MyChart.  Sign up information is provided on this After Visit Summary.  MyChart is used to connect with patients for Virtual Visits (Telemedicine).  Patients are able to view lab/test results, encounter notes, upcoming appointments, etc.  Non-urgent messages can be sent to your provider as well.   To learn more about what you can do with MyChart, go to [email or web address].   Other Instructions  Your physician wants you to follow-up in: 1 year.  You will receive a reminder letter in the mail two months in advance. If you don't receive a letter,  please call our office to schedule the follow-up appointment.

## 2023-12-03 ENCOUNTER — Encounter (HOSPITAL_BASED_OUTPATIENT_CLINIC_OR_DEPARTMENT_OTHER): Payer: Self-pay | Admitting: Nurse Practitioner

## 2023-12-09 ENCOUNTER — Encounter: Payer: Self-pay | Admitting: Family Medicine

## 2023-12-09 ENCOUNTER — Other Ambulatory Visit (HOSPITAL_COMMUNITY)
Admission: RE | Admit: 2023-12-09 | Discharge: 2023-12-09 | Disposition: A | Discharge status: Home / Self Care | Source: Ambulatory Visit | Attending: Family Medicine | Admitting: Family Medicine

## 2023-12-09 ENCOUNTER — Ambulatory Visit (INDEPENDENT_AMBULATORY_CARE_PROVIDER_SITE_OTHER): Admitting: Family Medicine

## 2023-12-09 VITALS — BP 109/68 | HR 84 | Ht 63.0 in | Wt 145.0 lb

## 2023-12-09 DIAGNOSIS — Z124 Encounter for screening for malignant neoplasm of cervix: Secondary | ICD-10-CM | POA: Insufficient documentation

## 2023-12-09 DIAGNOSIS — C50412 Malignant neoplasm of upper-outer quadrant of left female breast: Secondary | ICD-10-CM

## 2023-12-09 DIAGNOSIS — N644 Mastodynia: Secondary | ICD-10-CM

## 2023-12-09 DIAGNOSIS — Z170 Estrogen receptor positive status [ER+]: Secondary | ICD-10-CM

## 2023-12-09 DIAGNOSIS — Z01419 Encounter for gynecological examination (general) (routine) without abnormal findings: Secondary | ICD-10-CM

## 2023-12-09 NOTE — Progress Notes (Signed)
 Patient presents for Annual.  LMP: Patient's last menstrual period was 01/25/2014 (approximate).  Last pap: Date: 2019-WNL Contraception: Post-menopausal Mammogram: Due, last mammogram: 01/25/23- STD Screening: not indicated Flu Vaccine : N/A  CC: annual

## 2023-12-09 NOTE — Progress Notes (Signed)
 Subjective:     Stephanie [name] is a 58 y.o. female and is here for a comprehensive physical exam. The patient reports problems - some breast tenderness under her scar. Scheduled for mammogram 10/25. To stop her tamoxifen at that time. No further PMB.    The following portions of the patient's history were reviewed and updated as appropriate: allergies, current medications, past family history, past medical history, past social history, past surgical history, and problem list.  Review of Systems Pertinent items noted in HPI and remainder of comprehensive ROS otherwise negative.   Objective:  Chaperone present for exam   BP 109/68   Pulse 84   Ht 5' 3 (1.6 m)   Wt 145 lb (65.8 kg)   LMP 01/25/2014 (Approximate)   BMI 25.69 kg/m  General appearance: alert, cooperative, and appears stated age Head: Normocephalic, without obvious abnormality, atraumatic Neck: no adenopathy, supple, symmetrical, trachea midline, and thyroid not enlarged, symmetric, no tenderness/mass/nodules Lungs: clear to auscultation bilaterally Breasts: stellate scar left breast, skin is well healed, mildly tender, no mass appreciated Heart: regular rate and rhythm, S1, S2 normal, no murmur, click, rub or gallop Abdomen: soft, non-tender; bowel sounds normal; no masses,  no organomegaly Pelvic: cervix normal in appearance, external genitalia normal, no adnexal masses or tenderness, no cervical motion tenderness, uterus normal size, shape, and consistency, and vagina normal without discharge Extremities: extremities normal, atraumatic, no cyanosis or edema Pulses: 2+ and symmetric Skin: Skin color, texture, turgor normal. No rashes or lesions Lymph nodes: Cervical, supraclavicular, and axillary nodes normal. Neurologic: Grossly normal    Assessment:    GYN female exam.      Plan:   Problem List Items Addressed This Visit       Unprioritized   Malignant neoplasm of upper-outer quadrant of left breast in  female, estrogen receptor positive (HCC)   99213 - Given pain, will check diag mammo and u/s sooner than 10/25.      Relevant Orders   MS 3D DIAG MAMMO UNI LT BR (aka MM)   US  LIMITED ULTRASOUND INCLUDING AXILLA LEFT BREAST    Other Visit Diagnoses       Encounter for gynecological examination without abnormal finding    -  Primary   Relevant Orders   Hemoglobin A1c   TSH   CBC     Screening for malignant neoplasm of cervix       Relevant Orders   Cytology - PAP     Mastalgia       Relevant Orders   MS 3D DIAG MAMMO UNI LT BR (aka MM)   US  LIMITED ULTRASOUND INCLUDING AXILLA LEFT BREAST          See After Visit Summary for Counseling Recommendations

## 2023-12-09 NOTE — Assessment & Plan Note (Addendum)
 00786 - Given pain, will check diag mammo and u/s sooner than 10/25.

## 2023-12-10 ENCOUNTER — Ambulatory Visit: Payer: Self-pay | Admitting: Family Medicine

## 2023-12-10 LAB — CBC
Hematocrit: 38.6 % (ref 34.0–46.6)
Hemoglobin: 12.6 g/dL (ref 11.1–15.9)
MCH: 30.7 pg (ref 26.6–33.0)
MCHC: 32.6 g/dL (ref 31.5–35.7)
MCV: 94 fL (ref 79–97)
Platelets: 338 x10E3/uL (ref 150–450)
RBC: 4.1 x10E6/uL (ref 3.77–5.28)
RDW: 11.7 % (ref 11.7–15.4)
WBC: 7 x10E3/uL (ref 3.4–10.8)

## 2023-12-10 LAB — HEMOGLOBIN A1C
Est. average glucose Bld gHb Est-mCnc: 117 mg/dL
Hgb A1c MFr Bld: 5.7 % — ABNORMAL HIGH (ref 4.8–5.6)

## 2023-12-10 LAB — TSH: TSH: 2.63 u[IU]/mL (ref 0.450–4.500)

## 2023-12-14 LAB — CYTOLOGY - PAP
Comment: NEGATIVE
Diagnosis: NEGATIVE
High risk HPV: NEGATIVE

## 2023-12-22 ENCOUNTER — Other Ambulatory Visit (HOSPITAL_BASED_OUTPATIENT_CLINIC_OR_DEPARTMENT_OTHER): Payer: Self-pay

## 2023-12-29 ENCOUNTER — Ambulatory Visit
Admission: RE | Admit: 2023-12-29 | Discharge: 2023-12-29 | Disposition: A | Discharge status: Home / Self Care | Source: Ambulatory Visit | Attending: Family Medicine | Admitting: Family Medicine

## 2023-12-29 ENCOUNTER — Other Ambulatory Visit: Payer: Self-pay | Admitting: Family Medicine

## 2023-12-29 DIAGNOSIS — Z170 Estrogen receptor positive status [ER+]: Secondary | ICD-10-CM

## 2023-12-29 DIAGNOSIS — R928 Other abnormal and inconclusive findings on diagnostic imaging of breast: Secondary | ICD-10-CM | POA: Diagnosis not present

## 2023-12-29 DIAGNOSIS — Z124 Encounter for screening for malignant neoplasm of cervix: Secondary | ICD-10-CM

## 2023-12-29 DIAGNOSIS — N644 Mastodynia: Secondary | ICD-10-CM | POA: Diagnosis not present

## 2023-12-29 DIAGNOSIS — Z01419 Encounter for gynecological examination (general) (routine) without abnormal findings: Secondary | ICD-10-CM

## 2024-01-09 ENCOUNTER — Encounter: Payer: Self-pay | Admitting: Hematology and Oncology

## 2024-01-21 ENCOUNTER — Encounter: Payer: Self-pay | Admitting: Family Medicine

## 2024-01-25 ENCOUNTER — Encounter (HOSPITAL_BASED_OUTPATIENT_CLINIC_OR_DEPARTMENT_OTHER): Payer: Self-pay

## 2024-01-26 ENCOUNTER — Ambulatory Visit

## 2024-02-10 ENCOUNTER — Other Ambulatory Visit (HOSPITAL_BASED_OUTPATIENT_CLINIC_OR_DEPARTMENT_OTHER): Payer: Self-pay

## 2024-02-10 ENCOUNTER — Other Ambulatory Visit: Payer: Self-pay

## 2024-02-24 DIAGNOSIS — D0512 Intraductal carcinoma in situ of left breast: Secondary | ICD-10-CM | POA: Diagnosis not present

## 2024-03-03 ENCOUNTER — Other Ambulatory Visit (HOSPITAL_BASED_OUTPATIENT_CLINIC_OR_DEPARTMENT_OTHER): Payer: Self-pay

## 2024-03-15 ENCOUNTER — Telehealth: Payer: Self-pay | Admitting: Family Medicine

## 2024-03-15 NOTE — Telephone Encounter (Signed)
 Patient called and stated she has intermittent vibration sensation on inside of right heel like when you feel your phone buzz. She has no idea what is causing it. She would like to know if Dr. Claudene has any suggestions.

## 2024-03-29 ENCOUNTER — Other Ambulatory Visit (HOSPITAL_BASED_OUTPATIENT_CLINIC_OR_DEPARTMENT_OTHER): Payer: Self-pay | Admitting: Family

## 2024-03-29 DIAGNOSIS — E7800 Pure hypercholesterolemia, unspecified: Secondary | ICD-10-CM

## 2024-04-04 ENCOUNTER — Other Ambulatory Visit (HOSPITAL_BASED_OUTPATIENT_CLINIC_OR_DEPARTMENT_OTHER): Payer: Self-pay

## 2024-04-04 MED ORDER — ROSUVASTATIN CALCIUM 10 MG PO TABS
10.0000 mg | ORAL_TABLET | Freq: Every day | ORAL | 3 refills | Status: AC
  Filled 2024-04-04: qty 90, 90d supply, fill #0

## 2024-04-10 ENCOUNTER — Encounter: Payer: Self-pay | Admitting: Hematology and Oncology

## 2024-04-10 ENCOUNTER — Other Ambulatory Visit (HOSPITAL_BASED_OUTPATIENT_CLINIC_OR_DEPARTMENT_OTHER): Payer: Self-pay

## 2024-04-26 ENCOUNTER — Other Ambulatory Visit (HOSPITAL_COMMUNITY): Payer: Self-pay

## 2024-04-26 ENCOUNTER — Other Ambulatory Visit (HOSPITAL_BASED_OUTPATIENT_CLINIC_OR_DEPARTMENT_OTHER): Payer: Self-pay

## 2024-04-26 ENCOUNTER — Telehealth: Admitting: Physician Assistant

## 2024-04-26 DIAGNOSIS — J019 Acute sinusitis, unspecified: Secondary | ICD-10-CM

## 2024-04-26 DIAGNOSIS — B9689 Other specified bacterial agents as the cause of diseases classified elsewhere: Secondary | ICD-10-CM

## 2024-04-26 MED ORDER — AMOXICILLIN-POT CLAVULANATE 875-125 MG PO TABS
1.0000 | ORAL_TABLET | Freq: Two times a day (BID) | ORAL | 0 refills | Status: AC
  Filled 2024-04-26: qty 14, 7d supply, fill #0

## 2024-04-26 MED ORDER — FLUTICASONE PROPIONATE 50 MCG/ACT NA SUSP
2.0000 | Freq: Every day | NASAL | 0 refills | Status: AC
  Filled 2024-04-26: qty 16, 30d supply, fill #0

## 2024-04-26 NOTE — Progress Notes (Signed)

## 2024-05-08 ENCOUNTER — Encounter: Payer: Self-pay | Admitting: Family Medicine

## 2024-05-09 ENCOUNTER — Other Ambulatory Visit: Payer: Self-pay

## 2024-05-09 ENCOUNTER — Other Ambulatory Visit (HOSPITAL_BASED_OUTPATIENT_CLINIC_OR_DEPARTMENT_OTHER): Payer: Self-pay

## 2024-05-09 MED ORDER — PREDNISONE 20 MG PO TABS
20.0000 mg | ORAL_TABLET | Freq: Every day | ORAL | 0 refills | Status: AC
  Filled 2024-05-09: qty 7, 7d supply, fill #0

## 2024-05-11 ENCOUNTER — Other Ambulatory Visit: Payer: Self-pay

## 2024-05-11 ENCOUNTER — Other Ambulatory Visit (HOSPITAL_BASED_OUTPATIENT_CLINIC_OR_DEPARTMENT_OTHER): Payer: Self-pay

## 2024-05-11 ENCOUNTER — Other Ambulatory Visit: Payer: Self-pay | Admitting: Adult Health

## 2024-05-11 ENCOUNTER — Encounter: Payer: Self-pay | Admitting: Pharmacist

## 2024-05-11 ENCOUNTER — Other Ambulatory Visit (HOSPITAL_COMMUNITY): Payer: Self-pay

## 2024-05-11 MED ORDER — VENLAFAXINE HCL ER 75 MG PO CP24
75.0000 mg | ORAL_CAPSULE | Freq: Every day | ORAL | 1 refills | Status: AC
  Filled 2024-05-11: qty 90, 90d supply, fill #0

## 2024-05-12 ENCOUNTER — Encounter: Payer: Self-pay | Admitting: Hematology and Oncology

## 2024-05-16 ENCOUNTER — Other Ambulatory Visit: Payer: Self-pay

## 2024-05-19 NOTE — Progress Notes (Addendum)
 " Darlyn Claud[facility] [address] [place] [address] Phone: [phone] Subjective:   Stephanie [name] Stephanie [name], am serving as a scribe for Dr. Arthea Claudene.  I'm seeing this patient by the request  of:  Melvenia Corean SAILOR, NP  CC: Back and neck pain follow-up  YEP:Dlagzrupcz  Stephanie [name] is a 59 y.o. female coming in with complaint of back and neck pain. OMT on 02/04/2023. Has some soreness in L hip.   Patient states R hand pain as well.  Was having pain and getting prednisone  in the interim.  Hand pain patient thinks is secondary to knitting.  Patient states that her hand is a lot better. Did not have to take prednisone . Patient just came off of Tamoxifen  and she realized that her pain was starting to improve. Had some pain in [facility] joint yesterday with mousing.   Medications patient has been prescribed:   Taking:         Reviewed prior external information including notes and imaging from previsou exam, outside providers and external EMR if available.   As well as notes that were available from care everywhere and other healthcare systems.  Past medical history, social, surgical and family history all reviewed in electronic medical record.  No pertanent information unless stated regarding to the chief complaint.   Past Medical History:  Diagnosis Date   Anxiety    Breast mass, left    Cancer (HCC)    Complication of anesthesia    felt lethargic x 4 days following last surgery in 2017, but 2018 did okay   Depression    Family history of premature CAD 12/29/2022   Headache    migraines   History of radiation therapy 03/13/19- 04/11/19   Left Breast 16 fraction of 2. 66 Gy each to total 42.56 Gy. Left breast boost 4 fractions of 2 Gy to total 8 Gy.    Osteopenia    Pure hypercholesterolemia 12/29/2022    Allergies[1]   Review of Systems:  No headache, visual changes, nausea, vomiting, diarrhea, constipation, dizziness, abdominal pain, skin  rash, fevers, chills, night sweats, weight loss, swollen lymph nodes, body aches, joint swelling, chest pain, shortness of breath, mood changes. POSITIVE muscle aches  Objective  Blood pressure 108/68, pulse 65, height 5' 3 (1.6 m), weight 153 lb (69.4 kg), last menstrual period 01/25/2014, SpO2 97%.   General: No apparent distress alert and oriented x3 mood and affect normal, dressed appropriately.  HEENT: Pupils equal, extraocular movements intact  Respiratory: Patient's speak in full sentences and does not appear short of breath  Cardiovascular: No lower extremity edema, non tender, no erythema  Gait relatively normal MSK:  Back does have some loss of lordosis noted.  Some tenderness to palpation noted.  Tightness in the parascapular area. Right thumb does have a positive grind test noted.  Procedure right CMC joint injection  Ultrasound guided injection is preferred based studies that show increased duration, increased effect, greater accuracy, decreased procedural pain, increased response rate, and decreased cost with ultrasound guided versus blind injection.  Verbal informed consent obtained.  Time-out conducted.  Noted no overlying erythema, induration, or other signs of local infection.  Skin prepped in a sterile fashion.  Local anesthesia: Topical Ethyl chloride.  With sterile technique and under real time ultrasound guidance: needle advanced into subacromial bursa, bursa seen distending under real time ultrasound guidance. 0.5 cc Kenalog -40, 1 cc marcaine   injected into right CMC jiont. Completed without difficulty  Pain immediately resolved suggesting accurate placement of the medication.  Advised to call if fevers/chills, erythema, induration, drainage, or persistent bleeding.  Images saved.   Osteopathic findings  C2 flexed rotated and side bent right C5 flexed rotated and side bent left T3 extended rotated and side bent right inhaled rib T5extended rotated and side  bent left L5 flexed rotated and side bent right Sacrum right on right     Assessment and Plan:  Arthritis of carpometacarpal (CMC) joint of right thumb Chronic problem with exacerbation.  Failed all conservative therapy.  Discussed icing regimen and home exercises, discussed which activities to do and which ones to avoid.  Increase activity slowly.  Discussed icing regimen.  Follow-up again in 6 to 12 weeks  SI (sacroiliac) joint dysfunction Chronic problem with exacerbation.  Increase activity slowly.  Discussed which activities to do.  Increase activity slowly.  Follow-up again in 6 to 12 weeks.    Nonallopathic problems  Decision today to treat with OMT was based on Physical Exam  After verbal consent patient was treated with HVLA, ME, FPR techniques in cervical, rib, thoracic, lumbar, and sacral  areas  Patient tolerated the procedure well with improvement in symptoms  Patient given exercises, stretches and lifestyle modifications  See medications in patient instructions if given  Patient will follow up in 4-8 weeks     The above documentation has been reviewed and is accurate and complete Stephanie [name] M Stephanie Mcmanaway, DO         Note: This dictation was prepared with Dragon dictation along with smaller phrase technology. Any transcriptional errors that result from this process are unintentional.            [1]  Allergies Allergen Reactions   Wellbutrin  Glennette Sergeant ] Hives   "

## 2024-05-23 ENCOUNTER — Other Ambulatory Visit: Payer: Self-pay

## 2024-05-23 ENCOUNTER — Encounter: Payer: Self-pay | Admitting: Family Medicine

## 2024-05-23 ENCOUNTER — Ambulatory Visit: Admitting: Family Medicine

## 2024-05-23 VITALS — BP 108/68 | HR 65 | Ht 63.0 in | Wt 153.0 lb

## 2024-05-23 DIAGNOSIS — M79641 Pain in right hand: Secondary | ICD-10-CM

## 2024-05-23 DIAGNOSIS — M9903 Segmental and somatic dysfunction of lumbar region: Secondary | ICD-10-CM | POA: Diagnosis not present

## 2024-05-23 DIAGNOSIS — M533 Sacrococcygeal disorders, not elsewhere classified: Secondary | ICD-10-CM | POA: Diagnosis not present

## 2024-05-23 DIAGNOSIS — M1811 Unilateral primary osteoarthritis of first carpometacarpal joint, right hand: Secondary | ICD-10-CM | POA: Diagnosis not present

## 2024-05-23 DIAGNOSIS — M9902 Segmental and somatic dysfunction of thoracic region: Secondary | ICD-10-CM

## 2024-05-23 DIAGNOSIS — M9901 Segmental and somatic dysfunction of cervical region: Secondary | ICD-10-CM | POA: Diagnosis not present

## 2024-05-23 DIAGNOSIS — M9904 Segmental and somatic dysfunction of sacral region: Secondary | ICD-10-CM | POA: Diagnosis not present

## 2024-05-23 DIAGNOSIS — M9908 Segmental and somatic dysfunction of rib cage: Secondary | ICD-10-CM

## 2024-05-23 NOTE — Patient Instructions (Addendum)
 Injection in thumb See me again in 2 months

## 2024-05-23 NOTE — Assessment & Plan Note (Signed)
 Chronic problem with exacerbation.  Increase activity slowly.  Discussed which activities to do.  Increase activity slowly.  Follow-up again in 6 to 12 weeks.

## 2024-05-23 NOTE — Assessment & Plan Note (Signed)
 Chronic problem with exacerbation.  Failed all conservative therapy.  Discussed icing regimen and home exercises, discussed which activities to do and which ones to avoid.  Increase activity slowly.  Discussed icing regimen.  Follow-up again in 6 to 12 weeks

## 2024-05-30 ENCOUNTER — Ambulatory Visit: Admitting: Family Medicine

## 2024-06-07 ENCOUNTER — Inpatient Hospital Stay: Payer: Commercial Managed Care - PPO | Admitting: Hematology and Oncology

## 2024-07-31 ENCOUNTER — Ambulatory Visit: Admitting: Family Medicine
